# Patient Record
Sex: Female | Born: 1940
Health system: Southern US, Community
[De-identification: ages and names within clinical notes are randomized; demographics above are authoritative.]

## PROBLEM LIST (undated history)

## (undated) DIAGNOSIS — I2510 Atherosclerotic heart disease of native coronary artery without angina pectoris: Secondary | ICD-10-CM

## (undated) DIAGNOSIS — M1990 Unspecified osteoarthritis, unspecified site: Secondary | ICD-10-CM

## (undated) DIAGNOSIS — I442 Atrioventricular block, complete: Secondary | ICD-10-CM

## (undated) DIAGNOSIS — R0602 Shortness of breath: Secondary | ICD-10-CM

## (undated) DIAGNOSIS — Z950 Presence of cardiac pacemaker: Secondary | ICD-10-CM

## (undated) DIAGNOSIS — I219 Acute myocardial infarction, unspecified: Secondary | ICD-10-CM

## (undated) DIAGNOSIS — I872 Venous insufficiency (chronic) (peripheral): Secondary | ICD-10-CM

## (undated) DIAGNOSIS — I10 Essential (primary) hypertension: Secondary | ICD-10-CM

## (undated) DIAGNOSIS — E7800 Pure hypercholesterolemia, unspecified: Secondary | ICD-10-CM

## (undated) DIAGNOSIS — I499 Cardiac arrhythmia, unspecified: Secondary | ICD-10-CM

## (undated) DIAGNOSIS — E785 Hyperlipidemia, unspecified: Secondary | ICD-10-CM

## (undated) DIAGNOSIS — G8321 Monoplegia of upper limb affecting right dominant side: Secondary | ICD-10-CM

## (undated) DIAGNOSIS — G5793 Unspecified mononeuropathy of bilateral lower limbs: Secondary | ICD-10-CM

## (undated) DIAGNOSIS — G8929 Other chronic pain: Secondary | ICD-10-CM

## (undated) DIAGNOSIS — I509 Heart failure, unspecified: Secondary | ICD-10-CM

## (undated) DIAGNOSIS — R32 Unspecified urinary incontinence: Secondary | ICD-10-CM

## (undated) DIAGNOSIS — E119 Type 2 diabetes mellitus without complications: Secondary | ICD-10-CM

## (undated) HISTORY — DX: Atrioventricular block, complete: I44.2

## (undated) HISTORY — PX: BACK SURGERY: SHX140

## (undated) HISTORY — DX: Hyperlipidemia, unspecified: E78.5

## (undated) HISTORY — DX: Essential (primary) hypertension: I10

## (undated) HISTORY — PX: DILATION AND CURETTAGE OF UTERUS: SHX78

## (undated) HISTORY — DX: Venous insufficiency (chronic) (peripheral): I87.2

## (undated) HISTORY — PX: MOLE REMOVAL: SHX2046

## (undated) HISTORY — PX: KNEE ARTHROSCOPY: SHX127

---

## 1959-05-03 HISTORY — PX: DILATION AND CURETTAGE OF UTERUS: SHX78

## 1993-09-01 HISTORY — PX: LAPAROSCOPIC CHOLECYSTECTOMY: SUR755

## 2000-06-26 ENCOUNTER — Encounter: Admission: RE | Admit: 2000-06-26 | Discharge: 2000-06-26 | Payer: Self-pay | Admitting: General Surgery

## 2000-06-26 ENCOUNTER — Encounter (HOSPITAL_BASED_OUTPATIENT_CLINIC_OR_DEPARTMENT_OTHER): Payer: Self-pay | Admitting: General Surgery

## 2000-06-29 ENCOUNTER — Other Ambulatory Visit: Admission: RE | Admit: 2000-06-29 | Discharge: 2000-06-29 | Payer: Self-pay | Admitting: Internal Medicine

## 2000-08-28 HISTORY — PX: CARDIOVASCULAR STRESS TEST: SHX262

## 2000-09-10 ENCOUNTER — Ambulatory Visit (HOSPITAL_COMMUNITY): Admission: AD | Admit: 2000-09-10 | Discharge: 2000-09-10 | Payer: Self-pay | Admitting: Cardiology

## 2000-09-10 HISTORY — PX: CARDIAC CATHETERIZATION: SHX172

## 2002-12-14 ENCOUNTER — Emergency Department (HOSPITAL_COMMUNITY): Admission: EM | Admit: 2002-12-14 | Discharge: 2002-12-14 | Payer: Self-pay | Admitting: Emergency Medicine

## 2002-12-14 ENCOUNTER — Encounter: Payer: Self-pay | Admitting: Emergency Medicine

## 2003-02-07 ENCOUNTER — Other Ambulatory Visit: Admission: RE | Admit: 2003-02-07 | Discharge: 2003-02-07 | Payer: Self-pay | Admitting: Internal Medicine

## 2004-03-21 ENCOUNTER — Ambulatory Visit (HOSPITAL_COMMUNITY): Admission: RE | Admit: 2004-03-21 | Discharge: 2004-03-21 | Payer: Self-pay | Admitting: Internal Medicine

## 2004-05-22 ENCOUNTER — Ambulatory Visit (HOSPITAL_COMMUNITY): Admission: RE | Admit: 2004-05-22 | Discharge: 2004-05-22 | Payer: Self-pay | Admitting: Orthopedic Surgery

## 2004-10-04 ENCOUNTER — Ambulatory Visit (HOSPITAL_COMMUNITY): Admission: RE | Admit: 2004-10-04 | Discharge: 2004-10-04 | Payer: Self-pay | Admitting: Internal Medicine

## 2004-10-17 ENCOUNTER — Other Ambulatory Visit: Admission: RE | Admit: 2004-10-17 | Discharge: 2004-10-17 | Payer: Self-pay | Admitting: Internal Medicine

## 2004-11-26 ENCOUNTER — Encounter: Admission: RE | Admit: 2004-11-26 | Discharge: 2004-11-26 | Payer: Self-pay | Admitting: Orthopedic Surgery

## 2005-01-06 ENCOUNTER — Ambulatory Visit (HOSPITAL_COMMUNITY): Admission: RE | Admit: 2005-01-06 | Discharge: 2005-01-06 | Payer: Self-pay | Admitting: Orthopedic Surgery

## 2005-01-06 ENCOUNTER — Ambulatory Visit (HOSPITAL_BASED_OUTPATIENT_CLINIC_OR_DEPARTMENT_OTHER): Admission: RE | Admit: 2005-01-06 | Discharge: 2005-01-06 | Payer: Self-pay | Admitting: Orthopedic Surgery

## 2005-09-01 DIAGNOSIS — G8321 Monoplegia of upper limb affecting right dominant side: Secondary | ICD-10-CM

## 2005-09-01 HISTORY — PX: ANTERIOR CERVICAL DECOMP/DISCECTOMY FUSION: SHX1161

## 2005-09-01 HISTORY — DX: Monoplegia of upper limb affecting right dominant side: G83.21

## 2006-02-19 ENCOUNTER — Inpatient Hospital Stay (HOSPITAL_COMMUNITY): Admission: EM | Admit: 2006-02-19 | Discharge: 2006-02-20 | Payer: Self-pay | Admitting: Family Medicine

## 2006-03-12 ENCOUNTER — Encounter: Admission: RE | Admit: 2006-03-12 | Discharge: 2006-03-12 | Payer: Self-pay | Admitting: Internal Medicine

## 2006-03-20 ENCOUNTER — Inpatient Hospital Stay (HOSPITAL_COMMUNITY): Admission: AD | Admit: 2006-03-20 | Discharge: 2006-04-07 | Payer: Self-pay | Admitting: Neurosurgery

## 2006-03-26 ENCOUNTER — Ambulatory Visit: Payer: Self-pay | Admitting: Physical Medicine & Rehabilitation

## 2006-04-07 ENCOUNTER — Inpatient Hospital Stay (HOSPITAL_COMMUNITY)
Admission: RE | Admit: 2006-04-07 | Discharge: 2006-04-21 | Payer: Self-pay | Admitting: Physical Medicine & Rehabilitation

## 2006-05-25 ENCOUNTER — Encounter: Admission: RE | Admit: 2006-05-25 | Discharge: 2006-06-21 | Payer: Self-pay | Admitting: Neurosurgery

## 2006-06-22 ENCOUNTER — Encounter: Admission: RE | Admit: 2006-06-22 | Discharge: 2006-07-20 | Payer: Self-pay | Admitting: Neurosurgery

## 2006-07-21 ENCOUNTER — Encounter: Admission: RE | Admit: 2006-07-21 | Discharge: 2006-08-19 | Payer: Self-pay | Admitting: Neurosurgery

## 2007-02-11 ENCOUNTER — Ambulatory Visit: Payer: Self-pay | Admitting: *Deleted

## 2007-11-29 ENCOUNTER — Inpatient Hospital Stay (HOSPITAL_COMMUNITY): Admission: EM | Admit: 2007-11-29 | Discharge: 2007-12-02 | Payer: Self-pay | Admitting: Emergency Medicine

## 2007-11-30 ENCOUNTER — Encounter (INDEPENDENT_AMBULATORY_CARE_PROVIDER_SITE_OTHER): Payer: Self-pay | Admitting: Internal Medicine

## 2007-11-30 HISTORY — PX: TRANSTHORACIC ECHOCARDIOGRAM: SHX275

## 2007-12-01 HISTORY — PX: CARDIAC PACEMAKER PLACEMENT: SHX583

## 2008-10-24 HISTORY — PX: NM MYOCAR PERF WALL MOTION: HXRAD629

## 2008-10-25 ENCOUNTER — Encounter: Admission: RE | Admit: 2008-10-25 | Discharge: 2008-10-25 | Payer: Self-pay | Admitting: Neurosurgery

## 2010-09-22 ENCOUNTER — Encounter: Payer: Self-pay | Admitting: Internal Medicine

## 2010-11-06 ENCOUNTER — Other Ambulatory Visit: Payer: Self-pay | Admitting: Family Medicine

## 2010-11-06 DIAGNOSIS — Z1231 Encounter for screening mammogram for malignant neoplasm of breast: Secondary | ICD-10-CM

## 2010-11-15 ENCOUNTER — Ambulatory Visit
Admission: RE | Admit: 2010-11-15 | Discharge: 2010-11-15 | Disposition: A | Discharge status: Home / Self Care | Payer: Medicare Other | Source: Ambulatory Visit | Attending: Family Medicine | Admitting: Family Medicine

## 2010-11-15 DIAGNOSIS — Z1231 Encounter for screening mammogram for malignant neoplasm of breast: Secondary | ICD-10-CM

## 2011-01-07 ENCOUNTER — Other Ambulatory Visit: Payer: Self-pay | Admitting: Neurosurgery

## 2011-01-07 DIAGNOSIS — M549 Dorsalgia, unspecified: Secondary | ICD-10-CM

## 2011-01-07 DIAGNOSIS — M5410 Radiculopathy, site unspecified: Secondary | ICD-10-CM

## 2011-01-09 ENCOUNTER — Ambulatory Visit
Admission: RE | Admit: 2011-01-09 | Discharge: 2011-01-09 | Disposition: A | Discharge status: Home / Self Care | Payer: Medicare Other | Source: Ambulatory Visit | Attending: Neurosurgery | Admitting: Neurosurgery

## 2011-01-09 DIAGNOSIS — M5410 Radiculopathy, site unspecified: Secondary | ICD-10-CM

## 2011-01-09 DIAGNOSIS — M549 Dorsalgia, unspecified: Secondary | ICD-10-CM

## 2011-01-14 NOTE — Op Note (Signed)
[name], [name] NO.:  [id number]   MEDICAL RECORD NO.:  [id number]          PATIENT TYPE:  INP   LOCATION:  2807                         FACILITY:  [facility]   PHYSICIAN:  Ritta Slot, MD     DATE OF BIRTH:  1941/08/12   DATE OF PROCEDURE:  12/01/2007  DATE OF DISCHARGE:                               OPERATIVE REPORT   PROCEDURE:  Permanent pacemaker insertion.  Insertion of a [address]  [place], serial number [id number], dual-chamber pacemaker with active  atrial and ventricular leads.   COMPLICATIONS:  None.   INDICATIONS:  [name] is a 70 year old female patient, who was  admitted on November 29, 2007, in complete heart block and heart failure.  In the early hours of March 31, I inserted a temporary wire into her  left ventricle, where she was in complete heart block with a ventricular  rate of 30 with congestive heart failure.  She was brought to the  cardiac catheterization laboratory now for insertion of a permanent  pacemaker and removal of the temporary wire.   DESCRIPTION OF OPERATION:  After informed consent from the patient, the  left anterior chest was prepped and draped in sterile fashion.  EC  monitor established.  Lidocaine 1% was then used to anesthetize the left  mid and subclavicular areas.  She was given 2 mg of diazepam and 25 mcg  of fentanyl intravenously for light sedation.  Next, approximately a 3  cm mid-subclavicular incision was then carried out.  Hemostasis was  obtained with electrocautery.  Blunt dissection was used to carry this  down to the left pectoralis fascia.  Again, hemostasis obtained with  electrocautery.  Next, approximately a 3 x 4 cm pocket was created, left  pectoral fascia.  The left subclavian vein was then easily entered with  two #10 guide wires.  After the first #10 guide wire, a #7 [place]  dilator sheath was then easily inserted and the guide wire and dilator  then removed.  Through the first sheath, a  58 cm active [facility] Jude  Tendril SDX lead, serial number [id number], was then passed into the right  atrium.  Peel-away sheath was then removed.  A second #7 [place] dilator  sheath was then easily passed and the dilation catheter removed.  Through this, a 52 cm Tendril SDX active fixation lead, serial number  [id number], was then passed easily into the right atrium and peel-away  sheaths removed.  A J curved wire was then placed into the ventricular  lead and the ventricular lead was brought out through the tricuspid  valve and positioned at the RV apex without difficulty, so we were able  to capture 2 volts.  The screw was then extended, the threshold  determined.  R-waves were measured at 10.0 millivolts, impedance at 646  ohms, and threshold ventricle was 0.5 volts at 0.5 milliseconds.  The  current was 1.1 milliamp.  Ten volts negative for diaphragmatic  stimulation.  The preformed J stylet was then inserted into the atrial  lead and the atrial lead was then used to  map the right atrium.  Excellent position was found for the atrial lead without difficulty.  We  were able to find P-waves of 4.5.  Threshold in the atrial lead was  found to be 0.4 millivolts at 0.5 milliseconds.  Current was 1.6  milliamps.  Impedance was found to be 445 ohms.  Again, 10 volts were  found to have negative diaphragmatic stimulation.   The lead was screwed into place, as well.  Two silk sutures were needed  to anchor the lead to the pectoral fascia.  Again, hemostasis was  obtained, using electrocautery.  Pocket was then copiously irrigated  with 1% Kanamycin solution and the leads were connected in serial  fashion to a [facility], serial number [id number] generator.  Head screws  were tightened and pacing was confirmed.  Suture placed in the apex  pocket.  The generator leads were then delivered to the pocket.  Header  was secured with silk suture.  Subcutaneous layer was then closed using  2-0 Vicryl.  Skin  was then closed with 4-0 Vicryl.  Steri-Strips were  applied.  Patient was returned to recovery room in stable condition.   COMPLICATIONS:  None.   CONCLUSION:  Successful implant of a [address] [place], serial  number [id number] dual-chamber pacemaker , with active atrial and  ventricular leads.      Ritta Slot, MD  Electronically Signed     HS/MEDQ  D:  12/01/2007  T:  12/01/2007  Job:  [id number]

## 2011-01-14 NOTE — H&P (Signed)
[name], [name] NO.:  [id number]   MEDICAL RECORD NO.:  [id number]          PATIENT TYPE:  INP   LOCATION:  2303                         FACILITY:  [facility]   PHYSICIAN:  Lonia Blood, M.D.      DATE OF BIRTH:  Feb 08, 1941   DATE OF ADMISSION:  11/29/2007  DATE OF DISCHARGE:                              HISTORY & PHYSICAL   PRIMARY CARE PHYSICIAN:  Pearson Grippe, M.D.   CARDIOLOGIST:  Charolette Child, M.D.   PRESENTING COMPLAINT:  Dizziness, nausea, vomiting.   HISTORY OF PRESENT ILLNESS:  The patient is a 70 year old female with  history of hypertension and coronary artery disease and diet-controlled  diabetes who has apparently been doing okay until the past three days  when she started feeling weak and dizzy.  This progressed and became  worse and today she had some vomiting as well.  Hence, she was brought  into the emergency room.  In the ER the patient was found to have  complete heart block.  She is being evaluated by cardiology for possible  pacemaker.  She has had history of vertigo before, but she was seen by  her primary care physician as far back as 2006.  Today, however, she  went to the office where her rate was found to be 26 and was referred to  the emergency room.   PAST MEDICAL HISTORY:  Significant for coronary artery disease, status  post MI more than 10 years ago, hypertension and diet-controlled  diabetes.   ALLERGIES:  She is has no known drug allergies.   MEDICATIONS:  1. Aspirin 81 mg daily.  2. Bisoprolol/hydrochlorothiazide 5/25 mg.  3. Uniretic 15/12.5 mg.   SOCIAL HISTORY:  The patient is married, lives in [place].  She is a  former tobacco smoker but no alcohol intake.   FAMILY HISTORY:  Significant for diabetes and hypertension.   REVIEW OF SYSTEMS:  A 12-point review of systems is negative except per  HPI.  The patient complained mainly of weakness and dizziness as  indicated.   EXAMINATION:  Temperature is 97.4, blood  pressure 110/50.  Her pulse is  28, respiratory rate 25, sats 94% on room air.  GENERAL: The patient is awake, alert, oriented but appears weak, in no  acute distress.  HEENT:  PERRL.  EOMI.  NECK:  Supple.  No JVD, no lymphadenopathy.  RESPIRATORY:  She has good air entry bilaterally.  No wheezes, no rales.  CARDIOVASCULAR SYSTEM:  The patient is profoundly bradycardic with  distant heart sounds.  ABDOMEN:  Soft, nontender with positive bowel sounds.  EXTREMITIES:  No edema, cyanosis or clubbing.   LABORATORY DATA:  White count is 10.0, hemoglobin 12.6, platelet count  184.  Initial cardiac enzymes negative.  Sodium 136, potassium 4.5,  chloride 106, CO2 24, glucose 109, BUN 30, creatinine 0.77, calcium 8.4.  Chest x-ray showed medial right lung base and probable retrocardiac air  space disease, edema versus infection likely.  Her BNP is more than  1000.  EKG showed complete heart block with a rate of 26 with complete  AV dissociation, rightward axis and some LVH, mild nonspecific T-wave  changes.   ASSESSMENT:  This is a 70 year old female presenting with complete heart  block most likely secondary to cardiomyopathy.  The patient was seen by  her primary care physician in the office.  She is here for further  management.  She will need a permanent pacemaker and further workup.   PLAN:  1. Complete heart block.  Will admit the patient to the intensive care      unit.  I will put her on some external pacer.  Will monitor her      closely.  Contact [facility] who are      covering for tonight.  The patient will require permanent pacer in      the morning unless she deteriorates overnight.  2. Congestive heart failure.  Based on the patient's chest x-ray and      BNP, it seems like the patient is having some fluid overload.  I      will put her on some low-dose Lasix at this point.  Consider some      dopamine or dobutamine if okay with cardiology.  Continue  with some      aspirin as needed.  3. Hypertension.  The patient's blood pressure seems to be okay, on      the low side now.  I will just continue with Lasix and no other      treatment at this point.  4. History of coronary artery disease.  The cause of the patient's      complete heart block is not fully known.  Will check her cardiac      enzymes, rule out ischemia and treat accordingly.  5. Diet-controlled diabetes.  Will check hemoglobin A1c and continue      sliding scale insulin as necessary.      Lonia Blood, M.D.  Electronically Signed     LG/MEDQ  D:  11/29/2007  T:  11/30/2007  Job:  [id number]

## 2011-01-17 NOTE — Discharge Summary (Signed)
NAMEMARCHETTA, NAVRATIL NO.:  [id number]   MEDICAL RECORD NO.:  [id number]          PATIENT TYPE:  INP   LOCATION:  6526                         FACILITY:  [facility]   PHYSICIAN:  Elliot Cousin, M.D.    DATE OF BIRTH:  Jan 28, 1941   DATE OF ADMISSION:  02/19/2006  DATE OF DISCHARGE:  02/20/2006                                 DISCHARGE SUMMARY   DISCHARGE DIAGNOSES:  1.  Atypical chest pain, cardiac enzymes were negative during hospital      course.  2.  Possible degenerative joint disease with associated neuropathy as a      cause of the pain.  3.  History of chest pain with normal coronary arteries per catheterization      in 2002.  4.  Abnormal EKG which revealed a bifascicular block, unchanged from the EKG      in May2006.  5.  Hyperlipidemia.  6.  Type 2 diabetes mellitus.  7.  Hypertension.   For the secondary discharge diagnoses and past medical history, please see  the dictated history and physical.   DISCHARGE MEDICATIONS:  1.  Bisoprolol 5 mg daily.  2.  Metformin 750 mg daily.  3.  Uniretic 15/12.5 mg half a tablet daily.  4.  Neurontin 300 mg q.h.s.  5.  Lipitor 20 mg q.h.s.  6.  Aspirin 81 mg daily.  7.  Tylenol or Motrin take as directed and as needed.   DISCHARGE DISPOSITION:  The patient was discharged home in improved and  stable condition on June22,2007.  She was advised to follow up with Dr.  Lendell Caprice on [phone] at 11:15 a.m.  She was also advised to follow up  with Dr. Aleen Campi in approximately 2 weeks for further evaluation.   HISTORY OF PRESENT ILLNESS:  The patient is a 70 year old lady with a past  medical history significant for a myocardial infarction yet with a normal  cardiac catheterization in 2002, hypertension, and diabetes mellitus, who  presented to the emergency department on June21,2007 with a chief complaint  of chest pain.  The pain was primarily located over the left lateral chest  wall with some radiation to the  left shoulder and associated left arm  numbness.  She did receive some relief with a nitroglycerin tablet given by  the emergency department physician.  When she was evaluated in the emergency  department, her cardiac markers were negative.  Her EKG revealed a stable  bifascicular block.  However, given the patient's risk factors, she was  admitted for further evaluation and management.   HOSPITAL COURSE:  Problem 1.  ATYPICAL CHEST PAIN.  The patient was started  on prophylactic Lovenox, oxycodone and Tylenol as needed for pain, aspirin  therapy, and beta blocker therapy with Bisoprolol.  The patient was further  evaluated with cardiac enzymes and a fasting lipid panel.  The cardiac  enzymes were completely negative during hospital course.  Her fasting lipid  panel revealed a total cholesterol of 211, triglycerides of 380, HDL  cholesterol of 41, and LDL cholesterol of 94.  The patient was therefore  started on therapy with Lipitor 20 mg q.h.s.  Her TSH was also assessed and  found to be within normal limits at 2.246.  Upon further questioning, the  patient's symptoms originate from the left lateral wall with some radiation  to the left shoulder and occasional numbness down the left arm.  On  admission a chest x-ray was ordered and revealed no acute cardiopulmonary  disease, however it did reveal degenerative changes at the [facility] joints  bilaterally.  On exam, the patient had a fairly good range of motion of her  shoulder however, there was some tenderness.  The sensation of both arms  were intact.  However, given that the patient's symptoms are intermittent,  more than likely her pain is secondary to degenerative joint disease with  possible associated neuropathy.  The neuropathy not only could be from  degenerative joint disease but it could also be from diabetes.  Neurontin,  300 mg q.h.s. was prescribed prior to discharge Her chest pain resolved  prior to hospital discharge.  Given that  she had normal cardiac enzymes, she  was discharged to home. Further evaluation will be deferred to her primary  care physician, Dr. Lendell Caprice.   The patient certainly may benefit from cardiac retesting in the outpatient  setting.  This will be deferred to her primary cardiologist, Dr. Aleen Campi.   Problem 2.  HYPERLIPIDEMIA.  Given the results of the fasting lipid panel as  indicated above, the patient was started on Lipitor 20 mg q.h.s.   Problem 3.  DIABETES MELLITUS.  The patient was maintained on Glucophage  during hospital course.  Her hemoglobin A1c was 7.0.   Problem 4.  HYPERTENSION.  The patient was maintained on bisoprolol during  hospital course.  She was advised to continue the bisoprolol and  Uniretic  as previously prescibed.      Elliot Cousin, M.D.  Electronically Signed     DF/MEDQ  D:  02/23/2006  T:  02/24/2006  Job:  [id number]   cc:   Janae Bridgeman. Eloise Harman., M.D.  Fax: [phone]   Antionette Char, MD  Fax: [phone]

## 2011-01-17 NOTE — Discharge Summary (Signed)
NAMEHSER, BELANGER NO.:  [id number]   MEDICAL RECORD NO.:  [id number]          PATIENT TYPE:  INP   LOCATION:  2626                         FACILITY:  [facility]   PHYSICIAN:  Hilda Lias, M.D.   DATE OF BIRTH:  07-06-1941   DATE OF ADMISSION:  03/20/2006  DATE OF DISCHARGE:  04/07/2006                                 DISCHARGE SUMMARY   ADMISSION DATE:  March 20, 2006   DISCHARGE DATE:  April 07, 2006   ADMISSION DIAGNOSIS:  C5-6 and C6-7 herniated disc.   FINAL DIAGNOSES:  C5-6 and C6-7 herniated disc.  Calcification of the posterior ligament.   CLINICAL HISTORY:  Ms. Kratz is a lady, who was seen in my office  complaining of neck pain radiating up to the left upper extremity.  The  physical examination showed that she has weakness of biceps wrists and  triceps and she was unable to walk in a straight line.  X-ray of her neck  showed she has a quite impressive MRI with a large herniated disc and  calcified lower C5-6 and C6-7.  Because of the findings, surgery was  advised.   LABORATORY:  Normal.   COURSE OF THE HOSPITAL:  The patient was taken to surgery and the plan was  do a C5-6 and C6-7 discectomy, but because of calcification of the posterior  ligament, it was compromised to the spinal cord.  We ended up doing C5-6  corpectomy.  The procedure took around 4-5 hours and we had to transfuse  blood.  After surgery, patient was sleeping.  She has weakness of both lower  extremities.  She has had decreased of sensation in the right arm.  During  the procedure, we had to remove the dura matter and replace it with Duragen  and because of that, the patient had lumbar __________ .  In the ICU, the  patient was stable.  She was dense weak in the right side, but eventually  she started to get stronger in both legs, although was slow in the right  arm.  The LP  was kept for drainage until we repeat the x-ray and there was  no evidence of any CSF leak in the  neck.  The Jackson-Pratt drain was  removed and 2 days later, the LP drain was completely removed.  The patient  was neurologically stable and because she was going to require quite a bit  of rehabilitation, she was transferred to a rehab unit.  By the time, Ms.  Womac went to the rehab unit, she was awake, walking with quite a bit of  weakness of the right upper extremity, mostly involving the triceps and the  right fingers.   CONDITION ON DISCHARGE:  Is stable.   MEDICATIONS:  She will taking continue taking the same medications as she  was in the ICU.   Diet:  Regular.  Activity will be up to the rehabilitation center.   FOLLOWUP:  I will continue to follow Ms. Ey while she is in the rehab  unit.  ______________________________  Hilda Lias, M.D.     EB/MEDQ  D:  05/26/2006  T:  05/26/2006  Job:  [id number]

## 2011-01-17 NOTE — H&P (Signed)
[name], [name] NO.:  [id number]   MEDICAL RECORD NO.:  [id number]          PATIENT TYPE:  IPS   LOCATION:  4148                         FACILITY:  [facility]   PHYSICIAN:  Erick Colace, M.D.DATE OF BIRTH:  May 06, 1941   DATE OF ADMISSION:  04/07/2006  DATE OF DISCHARGE:                                HISTORY & PHYSICAL   REASON FOR ADMISSION:  Decline in self care mobility, right upper extremity  weakness and balance disorder secondary to cervical myelopathy.   HISTORY:  A 70 year old female with coronary artery disease and NIDDM  admitted March 20, 2006 with progressive upper extremity weakness and  numbness. MRI of the C spine shows C5-6, C6-7 stenosis and myelopathy. She  underwent C5-6 corpectomy and decompression and removal of calcified  posterior longitudinal ligament and infusion with PEEK cages C4 through C7  per Dr. Jeral Fruit.   PROTOCOL:  Postoperative C collar p.r.n. comfort when out of bed, subcu  heparin for DVT prophylaxis. Postop JP drain and LP PCA changed to p.o.  medications March 28, 2006. JP drain discontinued March 31, 2006, LP drain  discontinued April 06, 2006. Clean dressing, monitored and no CSF leak  noted. Decadron protocol completed. Had bouts of bradycardia in the 40s to  60s, Zebeta held if heart rate less than 50. Plan to have lateral C spine  films today.   REVIEW OF SYSTEMS:  Positive for weakness and numbness in the right upper  extremity as well as the lower extremities, lumbago and reflux.   PAST MEDICAL HISTORY:  Obesity and IDDM, hypertension, cholecystectomy.   FAMILY HISTORY:  CAD, hypertension.   SOCIAL HISTORY:  Lives with her husband, daughter to assist her in the day.  Husband and son to help at night. One-level home, four steps to enter.   FUNCTIONAL HISTORY:  Independent prior to admission.   FUNCTIONAL STATUS:  Needs assist with ADLs and mobility.   HOME MEDICATIONS:  1.  Neurontin 300 daily.  2.   __________ 750 daily.  3.  Bisoprolol 5 p.o. daily.  4.  Lexapro/hydrochlorothiazide 5/12.5 1/2 daily.  5.  Vitamin B6 daily.  6.  Vitamin B12 daily.  7.  Aspirin 81 mg daily.   ALLERGIES:  None known.   LABORATORY DATA:  BUN 17, creatinine 0.8.   PHYSICAL EXAMINATION:  VITAL SIGNS:  Blood pressure 110/60, pulse 68,  respirations 18, temperature 98.5.  GENERAL:  Obese female in no acute distress.  HEENT:  Eyes anicteric, not injected.  Extraocular movements, ENT normal.  NECK:  Supple without adenopathy.  LUNGS:  Respiration was good, lungs clear.  HEART:  Regular rate and rhythm. No rubs or extra sounds.  ABDOMEN:  Obese. Positive bowel sounds, soft, nontender to palpation.  EXTREMITIES:  No cyanosis, clubbing or edema. She has basically 1/5 strength  in the right deltoid, 2- at the biceps, 1 at the triceps, 2- at the wrist  flexors and extensors and finger flexor extensors. She had decreased  sensation in the right upper extremity to light touch. The lower extremity  has mildly decreased sensation but she  can identify light touch bilaterally.  Left upper extremity is 4/5 strength, lower extremities is 4/5 strength.  NEUROLOGIC:  Mood and orientation are intact. Affect is flat.   IMPRESSION:  1.  Cervical myelopathy, history of C5-6, C6-7 cervical stenosis status post      decompression and fusion.  2.  Bradycardia. Monitor on her current medications.  3.  Pain control. Continue Lyrica 75 t.i.d., Vicodin p.r.n., Robaxin p.r.n.,      DVT prophylaxis subcu heparin q.8.  4.  Non-insulin-dependent diabetes mellitus. Rehab nurse monitor CBG a.c.      h.s.  5.  Hypertension. Nurse needs to monitor as well as therapy. Mavik, Zebeta,      hydrochlorothiazide.   Will admit for comprehensive intensive inpatient rehabilitation with PT for  mobility, OT for ADLs, nursing for skin care over the cervical wound as well  as neurogenic bowel and bladder. The patient appears to be having  some  urinary retention issues and may need an intermittent catheterization  program. Will check bladder volume indicators.   ESTIMATED STAY:  7-10 days.   Patient good rehab candidate. Estimated function on discharge is  intermittent supervision for ADLs and mobility.      Erick Colace, M.D.  Electronically Signed     AEK/MEDQ  D:  04/07/2006  T:  04/07/2006  Job:  [id number]   cc:   Hilda Lias, M.D.  Antionette Char, MD

## 2011-01-17 NOTE — Discharge Summary (Signed)
[name], [name] NO.:  [id number]   MEDICAL RECORD NO.:  [id number]          PATIENT TYPE:  INP   LOCATION:  3735                         FACILITY:  [facility]   PHYSICIAN:  Ritta Slot, MD     DATE OF BIRTH:  [name]   DATE OF ADMISSION:  11/29/2007  DATE OF DISCHARGE:  12/02/2007                               DISCHARGE SUMMARY   DISCHARGE DIAGNOSES:  1. Complete heart block, status post [employer] Jude PTVDP this admission,      December 01, 2007.  2. Known coronary artery disease with remote myocardial infarction,      followed by Dr. Aleen Campi.  3. Treated hypertension.  4. Diet-controlled diabetes.  5. Treated dyslipidemia.   HOSPITAL COURSE:  The patient is a 70 year old female who is followed by  Dr. Selena Batten and Dr. Aleen Campi with remote coronary disease that was not  intervened on and hypertension and diabetes and dyslipidemia.  The  patient presented to [facility] ER November 29, 2007, with dizziness and near  syncope.  She was actually admitted by the [facility] and we saw  in consult.  EKG shows a heart rate of 26 and complete heart block.  The  patient was seen by Dr. Lynnea Ferrier.  A temporary pacemaker was placed on an  urgent basis.  The patient was transferred to Dr. Donavan Burnet service.  Permanent pacemaker was implanted by Dr. Lynnea Ferrier, December 01, 2007.  She  had a Therapist, sports.  She tolerated this well.  We felt she can  be discharged on December 02, 2007.   DISCHARGE MEDICATIONS:  Unfortunately not listed in the chart.  At the  time of this dictation, I am unable to ascertain what medicines she was  sent home on.  There is no med rec sheet in the chart and no copy of any  discharge instructions are left in the chart.   LABORATORY DATA:  Postop chest x-ray shows no pneumothorax.  White count  8.0, hemoglobin 12.4, hematocrit 35.8, platelets 122, INR is 1.1.  Sodium 138, potassium 3.9, BUN 9, creatinine 0.55 BNP on admission was  1098.  CK-MB and troponin  were negative.  TSH 2.7.   DISPOSITION:  The patient apparently was discharged in stable condition.  Followup should be with Dr. Aleen Campi.      Abelino Derrick, P.A.      Ritta Slot, MD  Electronically Signed    LKK/MEDQ  D:  02/10/2008  T:  02/11/2008  Job:  [id number]   cc:   Antionette Char, MD

## 2011-01-17 NOTE — H&P (Signed)
NAMENOELIA, LENART NO.:  [id number]   MEDICAL RECORD NO.:  [id number]          PATIENT TYPE:  INP   LOCATION:  1844                         FACILITY:  [facility]   PHYSICIAN:  Hettie Holstein, D.O.    DATE OF BIRTH:  [birth year]   DATE OF ADMISSION:  02/19/2006  DATE OF DISCHARGE:                                HISTORY & PHYSICAL   PRIMARY CARE PHYSICIAN:  Janae Bridgeman. Lendell Caprice, M.D.   CARDIOLOGIST:  Antionette Char, M.D.   CHIEF COMPLAINT:  Chest pain.   HISTORY OF PRESENT ILLNESS:  Mrs. Rimsha Trembley is a sedentary 70 year old  female with a known history of coronary disease.  According to her, she  states that Dr. Aleen Campi informed her she had a MI in the past.  She has  undergone cardiac catheterization in 2002 with a normal coronaries at that  time.  In any event, she had some on and off sharp chest pain radiating to  her left arm with left arm numbness.  She states that this is deep in the  left parasternal region with some radiation to her back.  She denied any  diaphoresis or shortness of breath.  She is typically at rest with this  discomfort.  She had some relief with nitroglycerin in the emergency  department.   Her initial point of care markers were negative in the department.  Her EKG  revealed normal sinus rhythm with a right bundle branch block as well as  left anterior fascicular block.  This was compared to EKG done in May of  2006 which was unchanged.   In the emergency department, she did report increase in symptoms and  subsequent decrease in her heart rate on telemetry.  Perhaps suggesting that  this may be a vagally mediated.  In any event, she is being admitted for  rule out of an acute event.  We will follow her cardiac markers and perhaps  request Dr. Aleen Campi to see her soon for further risk stratification.   PAST MEDICAL HISTORY:  1.  Mrs. Oloughlin as noted above has history of obesity.  2.  She has history of diabetes, non-insulin  requiring.  3.  Hypertension.  4.  History of MI in the distant past.  She had a cardiac catheterization in      January of 2002 with normal coronaries and an ejection fraction of 60%.  5.  She is status post cholecystectomy as well as knee surgeries.   MEDICATIONS:  She states she takes Metformin, she believes 500 mg q.h.s. and  bisoprolol (she does not know the dose) and aspirin daily.  She uses [facility] in  [place].   ALLERGIES:  She has no known drug allergies.   SOCIAL HISTORY:  The patient works.  She is self-employed.  Works at [employer].  She has two children.  She quit smoking tobacco about 13  years ago.  She lives with her husband.  She denies alcohol.   FAMILY HISTORY:  Mother and father both died in their 107s.  They had heart  disease at  an early age in their 108's and 74's.  They both suffered from  diabetes.   REVIEW OF SYSTEMS:  She had been in her usual state of health.  No shortness  of breath, swelling of her extremities.  No appetite or weight loss.  No  diarrhea, nausea, vomiting of coffee-ground emesis or dark tarry stools.  Further review is unremarkable.   PHYSICAL EXAMINATION:  VITAL SIGNS:  In the emergency department, her blood  pressure was 150/76, heart rate was 66, respirations 22, temperature 98.7.  O2 saturation 99%.  GENERAL:  The patient is alert and in no acute distress.  Conversing and  euthymic with a stable affect.  HEENT:  Reveals the head to be normocephalic and atraumatic.  Extraocular  movements intact.  NECK:  Supple and nontender.  No palpable thyromegaly or mass.  CARDIOVASCULAR:  Normal S1 and S2.  LUNGS:  Clear bilaterally.  She exhibits normal effort.  There is no  dullness to percussion.  ABDOMEN:  Soft and nontender.  There is no rebound or guarding.  EXTREMITIES:  No edema.  Peripheral pulses are symmetrical and palpable in  upper and lower extremities.  She has no calf tenderness.  NEUROLOGICAL:  As noted above.  She has no  focal deficits.   LABORATORY DATA:  Her sodium was 138, potassium 3.7, BUN 17, creatinine 0.5,  glucose 112, INR of 0.9.  Point of care is negative.  Chest x-ray was  unremarkable for acute disease.   ASSESSMENT:  1.  Atypical chest pain.  2.  Bifascicular heart block, right bundle and left anterior fascicular.  3.  Diabetes.  4.  Obesity.  5.  History of myocardial infarction per patient provided history.  6.  Hypokalemia.   PLAN:  At this time, we are going to cycle cardiac markers.  We are going to  follow on telemetry.  Follow her PR interval.  She does not know the dose of  beta blocker.  We will watch this carefully as it did not precipitate a  trifascicular block.  We will continue aspirin and initiate low-dose ACE  inhibitor for additional hypertensive management.      Hettie Holstein, D.O.  Electronically Signed     ESS/MEDQ  D:  02/19/2006  T:  02/19/2006  Job:  [id number]   cc:   Antionette Char, MD  Fax: [phone]   Janae Bridgeman. Eloise Harman., M.D.  Fax: [phone]

## 2011-01-17 NOTE — Cardiovascular Report (Signed)
[facility]  Patient:    [name], [name]                       MRN: [id number] Proc. Date: 09/10/00 Adm. Date:  [id number] Attending:  Silvestre Mesi CC:         Janae Bridgeman. Eloise Harman., M.D.  Cath Lab   Cardiac Catheterization  PROCEDURE: 1. Left heart catheterization. 2. Coronary cineangiography. 3. Left ventricular cineangiography. 4. Abdominal aortogram. 5. Perclose of the right femoral artery.  INDICATIONS:  This 70 year old female has a long history of chest pain and prior cardiac catheterization in 1996 showing normal coronary arteries. She recently had severe chest pain again which was very typical of angina with exertional pain and a Cardiolite stress test showing inferior wall scar.  Her resting electrocardiography also changed significantly showing ST T wave changes of ischemia.  She was then rescheduled for cardiac catheterization. She also has a history of hypertension.  DESCRIPTION OF PROCEDURE:  After signing an informed consent, the patient was premedicated with 50 mg of Benadryl intravenously and brought to the cardiac catheterization lab.  Her right groin was prepped and draped in the usual sterile fashion and anesthetized locally with 1% lidocaine.  A #6 French introducer sheath was inserted percutaneously to the right femoral artery. Access from the right femoral artery was obtained with an ultrasound Smart needle.  A 6 French #4 Judkins coronary catheters were used to make injections into the coronary arteries.  A 6 French pigtail catheter was used to measure pressures in the left ventricle and aorta and to make midstream injections into the left ventricle and abdominal aorta.  The patient tolerated the procedure well and no complications were noted.  At the end of the procedure, the catheter and sheath were removed from the right femoral artery and hemostasis was easily obtained with a Perclose closure  system.  MEDICATIONS GIVEN:  Versed 1 mg IV.  HEMODYNAMIC DATA:  Left ventricular pressure 181/10-27, aortic pressure 181/85 with a mean of 120.  CINE FINDINGS:  Coronary cineangiography.  Left coronary artery.  The ostium and left main appeared normal.  Left anterior descending appears normal.  Circumflex coronary artery appears normal.  Right coronary artery appears normal.  LEFT VENTRICULAR CINEANGIOGRAM:  The left ventricular chamber size is mildly enlarged.  The left ventricular wall thickness appears normal.  The overall left ventricular contractility appears normal with an ejection fraction that was measured at 60%.  The mitral valve appears normal without calcification, insufficiency, or prolapse.  The aortic valve appears normal.  ABDOMINAL AORTOGRAM:  The abdominal aorta appears normal without calcification or plaque.  The renal arteries appear normal.  FINAL DIAGNOSES: 1. Normal coronary arteries. 2. Normal left ventricular function, ejection fraction 60% with mild cardiac    enlargement. 3. Normal abdominal aorta and renal arteries. 4. Normal mitral and aortic valves. 5. Successful Perclose of the right femoral artery.  DISPOSITION:  Will monitor on the shortstay unit and plan for discharge in several hours. DD:  09/10/00 TD:  09/10/00 Job: [id number]

## 2011-01-17 NOTE — Discharge Summary (Signed)
NAMEJUNIE, [name] NO.:  [id number]   MEDICAL RECORD NO.:  [id number]          PATIENT TYPE:  IPS   LOCATION:  4036                         FACILITY:  [facility]   PHYSICIAN:  Erick Colace, M.D.DATE OF BIRTH:  1941-06-12   DATE OF ADMISSION:  04/07/2006  DATE OF DISCHARGE:  04/21/2006                                 DISCHARGE SUMMARY   DISCHARGE DIAGNOSES:  1. Cervical C5-6-6-7 cervical stenosis with myelopathy status post      cervical 5-6 colpectomy with decompression March 20, 2006.  2. J-P lumbar drain discontinued.  3. Decadron taper completed.  4. Pain management.  5. Subcutaneous heparin for deep vein thrombosis prophylaxis.  6. Neurogenic bladder.  7. Hypertension.  8. Non-insulin-dependent diabetes mellitus.   A 70 year old female admitted July 20 with progressive upper extremity  weakness and numbness.  MRI of cervical spine was C5-6-6-7 stenosis with  myelopathy.  Underwent C5-6 colpectomy with decompression and removal of  calcified posterior ligament with intimal infusion with peek and autoplate  cervical C4-7 July 20 per Dr. Jeral Fruit.  Decadron protocol postoperative.  Cervical collar ordered for comfort as needed.  Subcutaneous heparin was  initiated for deep vein thrombosis prophylaxis.  Postoperatively, she did  receive a J-P drain as well as a lumbar LP drain.  These were discontinued  on July 31, August 6, respectively.  Clean dressing was applied to site.  Bouts of bradycardia of 40-60 beats per minute and Zebeta was held if heart  rate less than 50.   PAST MEDICAL HISTORY:  See discharge diagnoses.  No alcohol.  Remote smoker.   ALLERGIES:  None.   SOCIAL HISTORY:  Lives with husband.  Daughter to assist during the day and  husband and son to help at nighttime.  They live in a one-level home with  four steps to entry.   MEDICATION PRIOR TO ADMISSION:  1. Neurontin 300 mg daily.  2. Glucophage 750 mg daily.  3. Bisoprolol 5 mg  daily.  4. Moexipril-HCTZ 5-12.5 mg one-half tablet daily.  5. Vitamin B6 daily.  6. Vitamin B12 daily.  7. Aspirin 81 mg daily.   REHABILITATION HOSPITAL COURSE:  The patient was admitted to inpatient rehab  services with therapies initiated on a three-hour daily basis consisting of  physical therapy, occupational therapy, and rehabilitation nursing.  The  following issues were addressed during patient's rehabilitation stay.  Pertaining to [name]'s cervical C5-6-6-7 cervical stenosis with  myelopathy she had undergone colpectomy with decompression March 20, 2006 per  Dr. Jeral Fruit.  Surgical site healing nicely.  Her Jackson-Pratt drain had  since been discontinued.  Decadron taper also completed.  She received  routine follow-up for neurosurgery, Dr. Jeral Fruit.  She showed progressive  gains in her overall therapies as she was now overall supervision to minimal  guard.  She did need minimal assist for lower body activities of daily  living, supervision upper body.  She was wearing a cervical collar only as  needed for comfort.  Pain management with adjustments made accordingly,  monitoring of mental status.  She was currently on  Lyrica 150 mg twice daily  as well as Robaxin for muscle spasms and using Ultram for breakthrough pain.  Her subcutaneous heparin was discontinued at time of discharge.  This had  been used for deep vein thrombosis prophylaxis.  Her calves remained cool  without any swelling, erythema, nontender.  Noted neurogenic bladder with  latest void of 150 mL, post void residual 503 mL with intermittent  catheterization of 500.  She did continue on Urecholine 25 mg three times  daily as well as Flomax 0.8 mg daily.  Patient and family were to be  instructed on intermittent catheterization versus indwelling Foley catheter.  She could follow up urology services as needed on an outpatient basis if  needed.  It was felt with ongoing increase in mobility that her bladder   would improve.  Blood sugars overall well controlled 76, 81, 97.  She would  continue on her diabetic diet as well as Glucophage of 750 mg daily.  Blood  pressures monitored with diastolic pressures of 68-73.  During her  rehabilitation stay due to some questionable increased right-sided weakness,  a cranial CT scan was ordered by Dr. Jeral Fruit that showed no acute changes,  only chronic microvascular white matter disease.   Latest laboratories showed a hemoglobin 13.7, hematocrit 39, platelet  183,000.  Sodium 135, potassium 4.1, BUN 12, creatinine 0.7.   DISCHARGE MEDICATIONS AT TIME OF DICTATION:  1. Zebeta 5 mg daily.  2. Vitamin B12 100 mcg daily.  3. Pepcid 20 mg twice daily.  4. Hydrochlorothiazide 6.25 mg daily.  5. Glucophage 750 mg daily.  6. Vitamin B6 50 mg daily.  7. Mavik 0.5 mg daily.  8. Lyrica 150 mg p.o. b.i.d.  9. Urecholine 25 mg p.o. three times daily.  10.Senokot tablets two at bedtime.  11.Flomax 0.8 mg at bedtime.  12.Robaxin 500 mg every six hours as needed spasms.  13.Ultram 50-100 mg three times daily as needed.   ACTIVITY:  As tolerated with cervical collar as needed for comfort.   DIET:  Diabetic diet.   SPECIAL INSTRUCTIONS:  Intermittent catheterizations every six hours.  Keep  volumes less than 350 mL.   FOLLOW UP:  Dr. Jeral Fruit [phone], neurosurgery; Dr. Lendell Caprice, medical  management.  Plan can be arranged to follow up with outpatient urology as  needed on an outpatient basis pertaining to her urinary retention after  follow-up with Dr. Jeral Fruit.      Mariam Dollar, P.A.      Erick Colace, M.D.  Electronically Signed    DA/MEDQ  D:  04/20/2006  T:  04/20/2006  Job:  [id number]   cc:   Hilda Lias, M.D.  Janae Bridgeman. Eloise Harman., M.D.  Antionette Char, MD

## 2011-01-17 NOTE — Op Note (Signed)
[name], [name] NO.:  [id number]   MEDICAL RECORD NO.:  [id number]          PATIENT TYPE:  INP   LOCATION:  2626                         FACILITY:  [facility]   PHYSICIAN:  Hilda Lias, M.D.   DATE OF BIRTH:  [birth year]   DATE OF PROCEDURE:  03/20/2006  DATE OF DISCHARGE:                                 OPERATIVE REPORT   PREOPERATIVE DIAGNOSES:  C5-6, C6-7 stenosis with cervical myelopathy.  Acute left C6-7 radiculopathy.   POSTOPERATIVE DIAGNOSES:  Same.   PROCEDURE:  C5-6 colpectomy  Decompression of the spinal cord.  Removal of calcified posterior ligament.  Intimal infusion with PEEK and aortograph plate from W2-3 microscope.   SURGICAL ASSISTANT:  Dr. Lovell Sheehan   CLINICAL HISTORY:  [name] is a 70 year old female who was seen by me  about two years ago because of lumbar spondylolisthesis.  She was seen  yesterday as urgent because of sudden onset of neck pain with radiation to  the left upper extremity.  The patient has been having some problems with  balance and she said that was secondary to her knees.  At the time I saw her  in my office it was difficult to evaluate the weakness because she had so  much pain.  She had weakness of the biceps and triceps but the most  important thing is that she had quite a bit of difficulty with coordination.  The cervical MRI showed severe stenosis at the level of 5-6 and 6-7  secondary to what looked like a calcified ligament with displacement of the  spinal cord at the level of 5-6 to the left and posterior and the midline  which was C6-7.  Because of the findings I talked to her and her daughter.  Surgery was advised.  The procedure was to see if we could attempt a  discectomy but leaving corpectomy most likely as the choice.  The risks were  explained to both of them including possibility of paralysis, no improvement  whatsoever, need for further surgery, __________  CSF leak, damage to the  esophagus,  damage to the carotid artery.   The patient was taken to the OR and she was intubated in the neutral  position.  No pillow or any type of material was used in the shoulder.  The  neck was prepped with DuraPrep and then a longitudinal incision was made  through the skin, subcutaneous tissue down to the cervical spine.  The  patient had a quite a bit of large osteophyte at the level of 5-6 and 6-7.  It was difficult to look at the x-ray because of a large shadow but at the  end we were able to see the where the needle was at the level of C4, C5.  During surgery we attempted to do a discectomy at 5-6 and most of the  discectomy was accomplished.  Nevertheless, there were more discs behind  some of them quite calcified.  Because of that we proceeded immediately with  a corpectomy of C5 and C6.  The patient had quite a bit of epidural veins.  The bone was quite a bit osteoporotic and had quite a bit of bleeding.  During the procedure we had to replace it with two units of packed cells.  We started decompression.  The disc had some areas which were soft.  The  disc mostly at the level of the right side of the level of 5-6 was  calcified.  With the use of the micro instrument I tried to develop a plane  between the ligament and the dura matter but there was such attachment  between both tissues that it was difficult.  Because of that we started  using the 1 mm Kerrison punch and the drill and we were able to remove the  calcified ligament along with the dura matter.  The patient had quite a bit  of disc material going into the foramen.  The most difficult part was trying  to find the C7 nerve root on the left side.  Nevertheless, decompression was  achieved.  At the end the spinal cord was pulsatile.  We had decompression  of the spinal cord.  The space above C4 was open, below C7 was open.  Then  we had good decompression of the spinal cord.  We used Duragen to cover the  dura matter.  Then PEEK  cage with autograph was inserted.  This was followed  by a plate using four screws.  We waited at least 10 minutes to be sure  there was not any bleeding.  Since we had good hemostasis, nevertheless we  left a Jackson-Pratt drain in the cervical area. The wound was closed with  Vicryl and Steri-strips.  Immediately after the procedure we turned the  patient on her side and Dr. Katrinka Blazing from the anesthesia department introduced  a lumbar catheter for spinal fluid drain.  The patient is going to be  extubated.  We are going to __________ .  We talked before surgery with this  lady that she might require rehabilitation.           ______________________________  Hilda Lias, M.D.     EB/MEDQ  D:  03/20/2006  T:  03/21/2006  Job:  [id number]

## 2011-01-17 NOTE — Op Note (Signed)
[name], [name] NO.:  [id number]   MEDICAL RECORD NO.:  [id number]          PATIENT TYPE:  AMB   LOCATION:  NESC                         FAC[facility]   PHYSICIAN:  Ollen Gross, M.D.    DATE OF BIRTH:  11-23-1940   DATE OF PROCEDURE:  01/06/2005  DATE OF DISCHARGE:                                 OPERATIVE REPORT   PREOPERATIVE DIAGNOSES:  Right knee medial meniscal tear and chondral  defect.   POSTOPERATIVE DIAGNOSES:  Right knee medial meniscal tear and chondral  defect.   PROCEDURE:  Right knee arthroscopy with meniscal debridement and  chondroplasty.   SURGEON:  Ollen Gross, M.D.   ASSISTANT:  None.   ANESTHESIA:  Local with MAC.   ESTIMATED BLOOD LOSS:  Minimal.   DRAINS:  None.   COMPLICATION:  None.   CONDITION:  Stable to recovery.   BRIEF CLINICAL NOTE:  [name] is a 70 year old female, several week to  month history of significant right knee pain and mechanical symptoms. Exam  and history are consistent with meniscal tear confirmed by MRI. She presents  now for arthroscopy and debridement.   PROCEDURE IN DETAIL:  After successful initiation of local with MAC  anesthetic, a tourniquet was placed high on the right thigh and right lower  extremity prepped and draped in usual sterile fashion. Standard superomedial  and inferolateral incisions are made, inflow cannula passed, superomedial  camera passed inferolateral. Arthroscopic visualization proceeds. The  undersurface of the patella showed some grade 1 and 2 chondromalacia with no  focal chondral defects. In the trochlea, there is a small central area about  5 x 5 mm with a small chondral defect otherwise the trochlea looks normal.  Medial and lateral gutters are visualized and flexion and valgus force  applied to the knee. The medial compartment is entered and she does have  grade 3 and smaller area of grade 4 chondromalacia in the medial femoral  condyle. The medial  meniscus is also torn. A spinal needle is used to  localize the inferomedial portal, a small incision made, dilator placed and  then the meniscus debrided back to a stable base with baskets and a 4.2 mm  shaver. The unstable cartilage on the surface of the medial femoral condyle  is then debrided back to a stable cartilaginous base with the 4.2 mm shaver.  The condyle is stable as is the meniscus post debridement. The intercondylar  notch is visualized, the ACL looks normal. The lateral compartment is also  normal. We then addressed the small chondral defect in the trochlea which  was debrided back to stable bony base with stable cartilaginous edges. The  arthroscopic equipment is then removed the inferior portals which are closed  with interrupted 4-0 nylon.  20 mL of 0.25% Marcaine with epi are injected through the inflow cannula and  that cannula was removed and that portal closed with nylon. A bulky sterile  dressing is applied and she is awakened and transferred to recovery in  stable condition.      FA/MEDQ  D:  01/06/2005  T:  01/06/2005  Job:  [id number]

## 2011-05-26 LAB — BASIC METABOLIC PANEL
CO2: 28
Calcium: 8.4
Chloride: 101
GFR calc Af Amer: >60
GFR calc Af Amer: >60
Glucose, Bld: 109 — ABNORMAL HIGH
Potassium: 3.7
Potassium: 4.5

## 2011-05-26 LAB — CBC
HCT: 34.5 — ABNORMAL LOW
HCT: 36.8
Hemoglobin: 12
MCV: 90.5
MCV: 90.8
Platelets: 184
RBC: 3.81 — ABNORMAL LOW
RDW: 13.2
WBC: 10
WBC: 11.8 — ABNORMAL HIGH

## 2011-05-26 LAB — TROPONIN I: Troponin I: 0.04

## 2011-05-26 LAB — DIFFERENTIAL
Basophils Absolute: 0
Eosinophils Absolute: 0
Monocytes Absolute: 0.5
Neutro Abs: 7.6

## 2011-05-26 LAB — CK TOTAL AND CKMB (NOT AT ARMC)
CK, MB: 2.2
Relative Index: INVALID

## 2011-05-26 LAB — LIPID PANEL
Cholesterol: 158
LDL Cholesterol: 80
Triglycerides: 174 — ABNORMAL HIGH

## 2011-05-26 LAB — HEMOGLOBIN A1C: Mean Plasma Glucose: 129

## 2011-05-26 LAB — POCT CARDIAC MARKERS
CKMB, poc: <1 — ABNORMAL LOW
Myoglobin, poc: 52.8

## 2011-05-27 LAB — BASIC METABOLIC PANEL
CO2: 29
Calcium: 8.3 — ABNORMAL LOW
Chloride: 102
GFR calc Af Amer: >60
Potassium: 3.9
Sodium: 138

## 2011-05-27 LAB — CBC
HCT: 35.8 — ABNORMAL LOW
Hemoglobin: 12.4
MCHC: 34.5
MCV: 91.4
RBC: 3.92

## 2011-06-12 HISTORY — PX: OTHER SURGICAL HISTORY: SHX169

## 2011-07-21 ENCOUNTER — Other Ambulatory Visit: Payer: Self-pay | Admitting: Neurosurgery

## 2011-07-21 DIAGNOSIS — IMO0002 Reserved for concepts with insufficient information to code with codable children: Secondary | ICD-10-CM

## 2011-07-21 DIAGNOSIS — M545 Low back pain: Secondary | ICD-10-CM

## 2011-07-30 ENCOUNTER — Other Ambulatory Visit: Payer: Medicare Other

## 2011-07-30 ENCOUNTER — Inpatient Hospital Stay
Admission: RE | Admit: 2011-07-30 | Discharge: 2011-07-30 | Payer: Medicare Other | Source: Ambulatory Visit | Attending: Neurosurgery | Admitting: Neurosurgery

## 2011-10-09 ENCOUNTER — Other Ambulatory Visit: Payer: Self-pay | Admitting: Neurosurgery

## 2011-10-09 DIAGNOSIS — IMO0002 Reserved for concepts with insufficient information to code with codable children: Secondary | ICD-10-CM

## 2011-10-09 DIAGNOSIS — M545 Low back pain: Secondary | ICD-10-CM

## 2011-10-15 DIAGNOSIS — I442 Atrioventricular block, complete: Secondary | ICD-10-CM | POA: Diagnosis not present

## 2011-10-15 DIAGNOSIS — Z45018 Encounter for adjustment and management of other part of cardiac pacemaker: Secondary | ICD-10-CM | POA: Diagnosis not present

## 2011-10-22 ENCOUNTER — Ambulatory Visit
Admission: RE | Admit: 2011-10-22 | Discharge: 2011-10-22 | Disposition: A | Discharge status: Home / Self Care | Payer: Medicare Other | Source: Ambulatory Visit | Attending: Neurosurgery | Admitting: Neurosurgery

## 2011-10-22 DIAGNOSIS — IMO0002 Reserved for concepts with insufficient information to code with codable children: Secondary | ICD-10-CM

## 2011-10-22 DIAGNOSIS — M5126 Other intervertebral disc displacement, lumbar region: Secondary | ICD-10-CM | POA: Diagnosis not present

## 2011-10-22 DIAGNOSIS — M47817 Spondylosis without myelopathy or radiculopathy, lumbosacral region: Secondary | ICD-10-CM | POA: Diagnosis not present

## 2011-10-22 DIAGNOSIS — M545 Low back pain: Secondary | ICD-10-CM

## 2011-10-22 MED ORDER — DIAZEPAM 5 MG PO TABS
5.0000 mg | ORAL_TABLET | Freq: Once | ORAL | Status: AC
  Administered 2011-10-22: 5 mg via ORAL

## 2011-10-22 MED ORDER — MEPERIDINE HCL 100 MG/ML IJ SOLN
75.0000 mg | Freq: Once | INTRAMUSCULAR | Status: AC
  Administered 2011-10-22: 75 mg via INTRAMUSCULAR

## 2011-10-22 MED ORDER — ONDANSETRON HCL 4 MG/2ML IJ SOLN
4.0000 mg | Freq: Once | INTRAMUSCULAR | Status: AC
  Administered 2011-10-22: 4 mg via INTRAMUSCULAR

## 2011-10-22 NOTE — Progress Notes (Signed)
Patient's daughter notified of 1245-1300 discharge time.  jkl

## 2011-10-22 NOTE — Discharge Instructions (Signed)

## 2011-10-30 DIAGNOSIS — M48061 Spinal stenosis, lumbar region without neurogenic claudication: Secondary | ICD-10-CM | POA: Diagnosis not present

## 2011-12-31 ENCOUNTER — Other Ambulatory Visit: Payer: Self-pay | Admitting: Family Medicine

## 2011-12-31 ENCOUNTER — Other Ambulatory Visit (HOSPITAL_COMMUNITY)
Admission: RE | Admit: 2011-12-31 | Discharge: 2011-12-31 | Disposition: A | Discharge status: Home / Self Care | Payer: Medicare Other | Source: Ambulatory Visit | Attending: Family Medicine | Admitting: Family Medicine

## 2011-12-31 DIAGNOSIS — M48061 Spinal stenosis, lumbar region without neurogenic claudication: Secondary | ICD-10-CM | POA: Diagnosis not present

## 2011-12-31 DIAGNOSIS — Z124 Encounter for screening for malignant neoplasm of cervix: Secondary | ICD-10-CM | POA: Insufficient documentation

## 2011-12-31 DIAGNOSIS — K625 Hemorrhage of anus and rectum: Secondary | ICD-10-CM | POA: Diagnosis not present

## 2011-12-31 DIAGNOSIS — E7800 Pure hypercholesterolemia, unspecified: Secondary | ICD-10-CM | POA: Diagnosis not present

## 2011-12-31 DIAGNOSIS — I10 Essential (primary) hypertension: Secondary | ICD-10-CM | POA: Diagnosis not present

## 2012-02-13 ENCOUNTER — Other Ambulatory Visit: Payer: Self-pay | Admitting: Neurosurgery

## 2012-02-26 ENCOUNTER — Encounter (HOSPITAL_COMMUNITY): Payer: Self-pay | Admitting: Pharmacy Technician

## 2012-02-27 ENCOUNTER — Encounter (HOSPITAL_COMMUNITY)
Admission: RE | Admit: 2012-02-27 | Discharge: 2012-02-27 | Disposition: A | Discharge status: Home / Self Care | Payer: Medicare Other | Source: Ambulatory Visit | Attending: Neurosurgery | Admitting: Neurosurgery

## 2012-02-27 ENCOUNTER — Encounter (HOSPITAL_COMMUNITY): Payer: Self-pay

## 2012-02-27 ENCOUNTER — Ambulatory Visit (HOSPITAL_COMMUNITY)
Admission: RE | Admit: 2012-02-27 | Discharge: 2012-02-27 | Disposition: A | Discharge status: Home / Self Care | Payer: Medicare Other | Source: Ambulatory Visit | Attending: Neurosurgery | Admitting: Neurosurgery

## 2012-02-27 DIAGNOSIS — Z01818 Encounter for other preprocedural examination: Secondary | ICD-10-CM | POA: Diagnosis not present

## 2012-02-27 DIAGNOSIS — Z01811 Encounter for preprocedural respiratory examination: Secondary | ICD-10-CM | POA: Diagnosis not present

## 2012-02-27 DIAGNOSIS — I495 Sick sinus syndrome: Secondary | ICD-10-CM | POA: Diagnosis not present

## 2012-02-27 DIAGNOSIS — I517 Cardiomegaly: Secondary | ICD-10-CM | POA: Insufficient documentation

## 2012-02-27 DIAGNOSIS — Z01812 Encounter for preprocedural laboratory examination: Secondary | ICD-10-CM | POA: Diagnosis not present

## 2012-02-27 HISTORY — DX: Atherosclerotic heart disease of native coronary artery without angina pectoris: I25.10

## 2012-02-27 HISTORY — DX: Unspecified mononeuropathy of bilateral lower limbs: G57.93

## 2012-02-27 HISTORY — DX: Cardiac arrhythmia, unspecified: I49.9

## 2012-02-27 HISTORY — DX: Presence of cardiac pacemaker: Z95.0

## 2012-02-27 HISTORY — DX: Pure hypercholesterolemia, unspecified: E78.00

## 2012-02-27 HISTORY — DX: Shortness of breath: R06.02

## 2012-02-27 HISTORY — DX: Essential (primary) hypertension: I10

## 2012-02-27 LAB — BASIC METABOLIC PANEL
BUN: 19 mg/dL (ref 6–23)
CO2: 31 mEq/L (ref 19–32)
GFR calc non Af Amer: 89 mL/min — ABNORMAL LOW (ref >90)
Glucose, Bld: 134 mg/dL — ABNORMAL HIGH (ref 70–99)
Potassium: 4 mEq/L (ref 3.5–5.1)
Sodium: 144 mEq/L (ref 135–145)

## 2012-02-27 LAB — CBC
HCT: 39.1 % (ref 36.0–46.0)
Hemoglobin: 13.1 g/dL (ref 12.0–15.0)
MCH: 30.3 pg (ref 26.0–34.0)
MCHC: 33.5 g/dL (ref 30.0–36.0)
MCV: 90.3 fL (ref 78.0–100.0)
RBC: 4.33 MIL/uL (ref 3.87–5.11)

## 2012-02-27 LAB — SURGICAL PCR SCREEN: MRSA, PCR: NEGATIVE

## 2012-02-27 NOTE — Pre-Procedure Instructions (Signed)
20 [name]  02/27/2012   Your procedure is scheduled on:  Tuesday, July 2  Report to [facility] at 0530 AM.  Call this number if you have problems the morning of surgery: [phone]   Remember:   Do not eat food:After Midnight.  May have clear liquids:until Midnight .  Clear liquids include soda, tea, black coffee, apple or grape juice, broth.  Take these medicines the morning of surgery with A SIP OF WATER: *Bisoprolol**   Do not wear jewelry, make-up or nail polish.  Do not wear lotions, powders, or perfumes. You may wear deodorant.  Do not shave 48 hours prior to surgery. Men may shave face and neck.  Do not bring valuables to the hospital.  Contacts, dentures or bridgework may not be worn into surgery.  Leave suitcase in the car. After surgery it may be brought to your room.  For patients admitted to the hospital, checkout time is 11:00 AM the day of discharge.   Patients discharged the day of surgery will not be allowed to drive home.  Name and phone number of your driver: *n/a**  Special Instructions: CHG Shower Use Special Wash: 1/2 bottle night before surgery and 1/2 bottle morning of surgery.   Please read over the following fact sheets that you were given: Pain Booklet, Coughing and Deep Breathing, MRSA Information and Surgical Site Infection Prevention

## 2012-02-27 NOTE — Progress Notes (Signed)
Pt seen in Pacemaker clinic @ [facility] today 6/28; [employer] representative aware of pt surgery.Pts cardiologist, Dr Ivor Messier is out of town until 03-09-2012

## 2012-03-01 ENCOUNTER — Encounter (HOSPITAL_COMMUNITY): Payer: Self-pay | Admitting: Vascular Surgery

## 2012-03-01 MED ORDER — CEFAZOLIN SODIUM-DEXTROSE 2-3 GM-% IV SOLR
2.0000 g | INTRAVENOUS | Status: AC
  Administered 2012-03-02: 2 g via INTRAVENOUS
  Filled 2012-03-01 (×2): qty 50

## 2012-03-01 NOTE — Consult Note (Signed)
Anesthesia Chart Review:  Patient is a 71 year old female scheduled for L3-5 laminectomy on 03/02/12 by Dr. Jeral Fruit.  History includes former smoker, HTN, normal coronaries by 2002 cath, hypercholesterolemia, DM2 diet controlled, peripheral neuropathy, venous insufficiency, CHB s/p St. Jude PPM '09, prior ACDF.  Cardiologist is Dr. Royann Shivers [facility]).  She was last seen on 05/23/11.  EKG then was AV paced.  Her last PPM check was on 02/27/12.  Her last stress test was on 10/24/08 and showed normal myocardial perfusion with EF 63%.  Echo from 11/30/07 showed overall left ventricular systolic function was normal with no left ventricular regional wall motion abnormalities, left ventricular wall thickness was at the upper limits of normal, and mean transaortic valve gradient was 10 mmHg.  CXR on 02/27/12 showed no active disease. Cardiomegaly again noted. Dual lead cardiac pacemaker in place.  Labs noted.  Plan to proceed.  PAT RN already notified the St. Jude representative of planned procedure.  Shonna Chock, PA-C .

## 2012-03-02 ENCOUNTER — Inpatient Hospital Stay (HOSPITAL_COMMUNITY)
Admission: RE | Admit: 2012-03-02 | Discharge: 2012-03-07 | DRG: 491 | Disposition: A | Discharge status: Home / Self Care | Payer: Medicare Other | Source: Ambulatory Visit | Attending: Neurosurgery | Admitting: Neurosurgery

## 2012-03-02 ENCOUNTER — Encounter (HOSPITAL_COMMUNITY): Payer: Self-pay | Admitting: *Deleted

## 2012-03-02 ENCOUNTER — Encounter (HOSPITAL_COMMUNITY): Payer: Self-pay | Admitting: Vascular Surgery

## 2012-03-02 ENCOUNTER — Encounter (HOSPITAL_COMMUNITY)
Admission: RE | Disposition: A | Discharge status: Home / Self Care | Payer: Self-pay | Source: Ambulatory Visit | Attending: Neurosurgery

## 2012-03-02 ENCOUNTER — Ambulatory Visit (HOSPITAL_COMMUNITY): Payer: Medicare Other | Admitting: Vascular Surgery

## 2012-03-02 ENCOUNTER — Encounter (HOSPITAL_COMMUNITY): Payer: Self-pay | Admitting: General Practice

## 2012-03-02 ENCOUNTER — Ambulatory Visit (HOSPITAL_COMMUNITY): Payer: Medicare Other

## 2012-03-02 DIAGNOSIS — Z7982 Long term (current) use of aspirin: Secondary | ICD-10-CM | POA: Diagnosis not present

## 2012-03-02 DIAGNOSIS — Z950 Presence of cardiac pacemaker: Secondary | ICD-10-CM

## 2012-03-02 DIAGNOSIS — I10 Essential (primary) hypertension: Secondary | ICD-10-CM | POA: Diagnosis not present

## 2012-03-02 DIAGNOSIS — Z981 Arthrodesis status: Secondary | ICD-10-CM | POA: Diagnosis not present

## 2012-03-02 DIAGNOSIS — M48062 Spinal stenosis, lumbar region with neurogenic claudication: Secondary | ICD-10-CM

## 2012-03-02 DIAGNOSIS — I2510 Atherosclerotic heart disease of native coronary artery without angina pectoris: Secondary | ICD-10-CM | POA: Diagnosis present

## 2012-03-02 DIAGNOSIS — E7800 Pure hypercholesterolemia, unspecified: Secondary | ICD-10-CM | POA: Diagnosis present

## 2012-03-02 DIAGNOSIS — M48061 Spinal stenosis, lumbar region without neurogenic claudication: Secondary | ICD-10-CM | POA: Diagnosis not present

## 2012-03-02 DIAGNOSIS — E119 Type 2 diabetes mellitus without complications: Secondary | ICD-10-CM | POA: Diagnosis present

## 2012-03-02 DIAGNOSIS — Z87891 Personal history of nicotine dependence: Secondary | ICD-10-CM | POA: Diagnosis not present

## 2012-03-02 DIAGNOSIS — IMO0002 Reserved for concepts with insufficient information to code with codable children: Secondary | ICD-10-CM | POA: Diagnosis not present

## 2012-03-02 DIAGNOSIS — M5137 Other intervertebral disc degeneration, lumbosacral region: Secondary | ICD-10-CM | POA: Diagnosis not present

## 2012-03-02 HISTORY — PX: LUMBAR LAMINECTOMY/DECOMPRESSION MICRODISCECTOMY: SHX5026

## 2012-03-02 HISTORY — DX: Unspecified osteoarthritis, unspecified site: M19.90

## 2012-03-02 SURGERY — LUMBAR LAMINECTOMY/DECOMPRESSION MICRODISCECTOMY 2 LEVELS
Anesthesia: General | Site: Back | Laterality: Bilateral | Wound class: Clean

## 2012-03-02 MED ORDER — HYDROCHLOROTHIAZIDE 12.5 MG PO CAPS
12.5000 mg | ORAL_CAPSULE | Freq: Every day | ORAL | Status: DC
  Administered 2012-03-02 – 2012-03-07 (×6): 12.5 mg via ORAL
  Filled 2012-03-02 (×6): qty 1

## 2012-03-02 MED ORDER — MENTHOL 3 MG MT LOZG
1.0000 | LOZENGE | OROMUCOSAL | Status: DC | PRN
  Administered 2012-03-02: 3 mg via ORAL
  Filled 2012-03-02: qty 9

## 2012-03-02 MED ORDER — HYDROMORPHONE HCL PF 1 MG/ML IJ SOLN
INTRAMUSCULAR | Status: AC
  Filled 2012-03-02: qty 1

## 2012-03-02 MED ORDER — ACETAMINOPHEN 650 MG RE SUPP: 650.0000 mg | RECTAL | Status: DC | PRN

## 2012-03-02 MED ORDER — THROMBIN 5000 UNITS EX KIT
PACK | CUTANEOUS | Status: DC | PRN
  Administered 2012-03-02 (×2): 5000 [IU] via TOPICAL

## 2012-03-02 MED ORDER — OXYCODONE-ACETAMINOPHEN 5-325 MG PO TABS
1.0000 | ORAL_TABLET | ORAL | Status: DC | PRN
  Administered 2012-03-02 – 2012-03-03 (×6): 2 via ORAL
  Administered 2012-03-04: 1 via ORAL
  Administered 2012-03-04 – 2012-03-06 (×8): 2 via ORAL
  Administered 2012-03-06: 1 via ORAL
  Administered 2012-03-06: 2 via ORAL
  Administered 2012-03-06: 1 via ORAL
  Administered 2012-03-06 – 2012-03-07 (×3): 2 via ORAL
  Filled 2012-03-02 (×14): qty 2
  Filled 2012-03-02: qty 1
  Filled 2012-03-02 (×6): qty 2
  Filled 2012-03-02: qty 1
  Filled 2012-03-02: qty 2

## 2012-03-02 MED ORDER — SODIUM CHLORIDE 0.9 % IJ SOLN
3.0000 mL | Freq: Two times a day (BID) | INTRAMUSCULAR | Status: DC
  Administered 2012-03-03 – 2012-03-07 (×8): 3 mL via INTRAVENOUS

## 2012-03-02 MED ORDER — PHENYLEPHRINE HCL 10 MG/ML IJ SOLN
INTRAMUSCULAR | Status: DC | PRN
  Administered 2012-03-02: 80 ug via INTRAVENOUS

## 2012-03-02 MED ORDER — SIMVASTATIN 5 MG PO TABS
5.0000 mg | ORAL_TABLET | Freq: Every day | ORAL | Status: DC
  Administered 2012-03-02 – 2012-03-06 (×5): 5 mg via ORAL
  Filled 2012-03-02 (×7): qty 1

## 2012-03-02 MED ORDER — BISOPROLOL FUMARATE 5 MG PO TABS
5.0000 mg | ORAL_TABLET | Freq: Every day | ORAL | Status: DC
  Administered 2012-03-03 – 2012-03-07 (×5): 5 mg via ORAL
  Filled 2012-03-02 (×6): qty 1

## 2012-03-02 MED ORDER — SODIUM CHLORIDE 0.9 % IJ SOLN: 3.0000 mL | INTRAMUSCULAR | Status: DC | PRN

## 2012-03-02 MED ORDER — BUPIVACAINE HCL (PF) 0.5 % IJ SOLN
INTRAMUSCULAR | Status: DC | PRN
  Administered 2012-03-02: 20 mL

## 2012-03-02 MED ORDER — ONDANSETRON HCL 4 MG/2ML IJ SOLN: 4.0000 mg | Freq: Once | INTRAMUSCULAR | Status: DC | PRN

## 2012-03-02 MED ORDER — ACETAMINOPHEN 325 MG PO TABS
650.0000 mg | ORAL_TABLET | ORAL | Status: DC | PRN
  Administered 2012-03-04: 650 mg via ORAL
  Filled 2012-03-02: qty 2

## 2012-03-02 MED ORDER — ROCURONIUM BROMIDE 100 MG/10ML IV SOLN
INTRAVENOUS | Status: DC | PRN
  Administered 2012-03-02: 40 mg via INTRAVENOUS

## 2012-03-02 MED ORDER — LISINOPRIL-HYDROCHLOROTHIAZIDE 20-12.5 MG PO TABS: 1.0000 | ORAL_TABLET | Freq: Every day | ORAL | Status: DC

## 2012-03-02 MED ORDER — ONDANSETRON HCL 4 MG/2ML IJ SOLN
INTRAMUSCULAR | Status: DC | PRN
  Administered 2012-03-02: 4 mg via INTRAVENOUS

## 2012-03-02 MED ORDER — LACTATED RINGERS IV SOLN
INTRAVENOUS | Status: DC | PRN
  Administered 2012-03-02 (×2): via INTRAVENOUS

## 2012-03-02 MED ORDER — ALUM & MAG HYDROXIDE-SIMETH 200-200-20 MG/5ML PO SUSP
30.0000 mL | ORAL | Status: DC | PRN
  Administered 2012-03-02: 30 mL via ORAL
  Filled 2012-03-02: qty 30

## 2012-03-02 MED ORDER — SODIUM CHLORIDE 0.9 % IV SOLN: INTRAVENOUS | Status: DC

## 2012-03-02 MED ORDER — 0.9 % SODIUM CHLORIDE (POUR BTL) OPTIME
TOPICAL | Status: DC | PRN
  Administered 2012-03-02: 1000 mL

## 2012-03-02 MED ORDER — LIDOCAINE HCL (CARDIAC) 20 MG/ML IV SOLN
INTRAVENOUS | Status: DC | PRN
  Administered 2012-03-02: 40 mg via INTRAVENOUS
  Administered 2012-03-02: 10 mg via INTRAVENOUS

## 2012-03-02 MED ORDER — PHENOL 1.4 % MT LIQD: 1.0000 | OROMUCOSAL | Status: DC | PRN

## 2012-03-02 MED ORDER — SODIUM CHLORIDE 0.9 % IV SOLN
250.0000 mL | INTRAVENOUS | Status: DC
  Administered 2012-03-02: 1000 mL via INTRAVENOUS

## 2012-03-02 MED ORDER — HEMOSTATIC AGENTS (NO CHARGE) OPTIME
TOPICAL | Status: DC | PRN
  Administered 2012-03-02: 1 via TOPICAL

## 2012-03-02 MED ORDER — PROPOFOL 10 MG/ML IV EMUL
INTRAVENOUS | Status: DC | PRN
  Administered 2012-03-02: 100 mg via INTRAVENOUS

## 2012-03-02 MED ORDER — LISINOPRIL 20 MG PO TABS
20.0000 mg | ORAL_TABLET | Freq: Every day | ORAL | Status: DC
  Administered 2012-03-02 – 2012-03-07 (×5): 20 mg via ORAL
  Filled 2012-03-02 (×6): qty 1

## 2012-03-02 MED ORDER — HYDROMORPHONE HCL PF 1 MG/ML IJ SOLN
0.2500 mg | INTRAMUSCULAR | Status: DC | PRN
  Administered 2012-03-02: 0.5 mg via INTRAVENOUS

## 2012-03-02 MED ORDER — CEFAZOLIN SODIUM 1-5 GM-% IV SOLN
1.0000 g | Freq: Three times a day (TID) | INTRAVENOUS | Status: AC
  Administered 2012-03-02 (×2): 1 g via INTRAVENOUS
  Filled 2012-03-02 (×2): qty 50

## 2012-03-02 MED ORDER — DIAZEPAM 5 MG PO TABS
5.0000 mg | ORAL_TABLET | Freq: Four times a day (QID) | ORAL | Status: DC | PRN
  Administered 2012-03-02 – 2012-03-07 (×7): 5 mg via ORAL
  Filled 2012-03-02 (×8): qty 1

## 2012-03-02 MED ORDER — MORPHINE SULFATE 4 MG/ML IJ SOLN
4.0000 mg | INTRAMUSCULAR | Status: DC | PRN
  Administered 2012-03-02 – 2012-03-06 (×2): 4 mg via INTRAVENOUS
  Filled 2012-03-02 (×2): qty 1

## 2012-03-02 MED ORDER — ONDANSETRON HCL 4 MG/2ML IJ SOLN: 4.0000 mg | INTRAMUSCULAR | Status: DC | PRN

## 2012-03-02 MED ORDER — FENTANYL CITRATE 0.05 MG/ML IJ SOLN
INTRAMUSCULAR | Status: DC | PRN
  Administered 2012-03-02: 150 ug via INTRAVENOUS
  Administered 2012-03-02: 50 ug via INTRAVENOUS

## 2012-03-02 MED ORDER — MIDAZOLAM HCL 5 MG/5ML IJ SOLN
INTRAMUSCULAR | Status: DC | PRN
  Administered 2012-03-02: 1 mg via INTRAVENOUS

## 2012-03-02 MED ORDER — ZOLPIDEM TARTRATE 5 MG PO TABS: 5.0000 mg | ORAL_TABLET | Freq: Every evening | ORAL | Status: DC | PRN

## 2012-03-02 SURGICAL SUPPLY — 61 items
APL SKNCLS STERI-STRIP NONHPOA (GAUZE/BANDAGES/DRESSINGS) ×1
BENZOIN TINCTURE PRP APPL 2/3 (GAUZE/BANDAGES/DRESSINGS) ×2 IMPLANT
BLADE SURG ROTATE 9660 (MISCELLANEOUS) IMPLANT
BUR ACORN 6.0 (BURR) ×1 IMPLANT
BUR MATCHSTICK NEURO 3.0 LAGG (BURR) ×2 IMPLANT
CANISTER SUCTION 2500CC (MISCELLANEOUS) ×2 IMPLANT
CLOTH BEACON ORANGE TIMEOUT ST (SAFETY) ×2 IMPLANT
CONT SPEC 4OZ CLIKSEAL STRL BL (MISCELLANEOUS) ×2 IMPLANT
DRAPE LAPAROTOMY 100X72X124 (DRAPES) ×2 IMPLANT
DRAPE MICROSCOPE LEICA (MISCELLANEOUS) ×3 IMPLANT
DRAPE POUCH INSTRU U-SHP 10X18 (DRAPES) ×2 IMPLANT
DRSG PAD ABDOMINAL 8X10 ST (GAUZE/BANDAGES/DRESSINGS) IMPLANT
DURAPREP 26ML APPLICATOR (WOUND CARE) ×2 IMPLANT
ELECT BLADE 4.0 EZ CLEAN MEGAD (MISCELLANEOUS) ×2
ELECT REM PT RETURN 9FT ADLT (ELECTROSURGICAL) ×2
ELECTRODE BLDE 4.0 EZ CLN MEGD (MISCELLANEOUS) IMPLANT
ELECTRODE REM PT RTRN 9FT ADLT (ELECTROSURGICAL) ×1 IMPLANT
EVACUATOR 3/16  PVC DRAIN (DRAIN) ×1
EVACUATOR 3/16 PVC DRAIN (DRAIN) IMPLANT
GAUZE SPONGE 4X4 16PLY XRAY LF (GAUZE/BANDAGES/DRESSINGS) IMPLANT
GLOVE BIOGEL M 8.0 STRL (GLOVE) ×2 IMPLANT
GLOVE BIOGEL PI IND STRL 8 (GLOVE) ×2 IMPLANT
GLOVE BIOGEL PI INDICATOR 8 (GLOVE) ×2
GLOVE ECLIPSE 7.5 STRL STRAW (GLOVE) ×4 IMPLANT
GLOVE EXAM NITRILE LRG STRL (GLOVE) IMPLANT
GLOVE EXAM NITRILE MD LF STRL (GLOVE) IMPLANT
GLOVE EXAM NITRILE XL STR (GLOVE) IMPLANT
GLOVE EXAM NITRILE XS STR PU (GLOVE) IMPLANT
GOWN BRE IMP SLV AUR LG STRL (GOWN DISPOSABLE) ×4 IMPLANT
GOWN BRE IMP SLV AUR XL STRL (GOWN DISPOSABLE) IMPLANT
GOWN STRL REIN 2XL LVL4 (GOWN DISPOSABLE) ×2 IMPLANT
KIT BASIN OR (CUSTOM PROCEDURE TRAY) ×2 IMPLANT
KIT ROOM TURNOVER OR (KITS) ×2 IMPLANT
NDL HYPO 18GX1.5 BLUNT FILL (NEEDLE) IMPLANT
NDL HYPO 21X1.5 SAFETY (NEEDLE) IMPLANT
NDL HYPO 25X1 1.5 SAFETY (NEEDLE) IMPLANT
NDL SPNL 20GX3.5 QUINCKE YW (NEEDLE) IMPLANT
NEEDLE HYPO 18GX1.5 BLUNT FILL (NEEDLE) IMPLANT
NEEDLE HYPO 21X1.5 SAFETY (NEEDLE) IMPLANT
NEEDLE HYPO 25X1 1.5 SAFETY (NEEDLE) ×2 IMPLANT
NEEDLE SPNL 20GX3.5 QUINCKE YW (NEEDLE) ×2 IMPLANT
NS IRRIG 1000ML POUR BTL (IV SOLUTION) ×2 IMPLANT
PACK LAMINECTOMY NEURO (CUSTOM PROCEDURE TRAY) ×2 IMPLANT
PAD ARMBOARD 7.5X6 YLW CONV (MISCELLANEOUS) ×6 IMPLANT
PATTIES SURGICAL .5 X1 (DISPOSABLE) ×2 IMPLANT
RUBBERBAND STERILE (MISCELLANEOUS) ×4 IMPLANT
SPONGE GAUZE 4X4 12PLY (GAUZE/BANDAGES/DRESSINGS) ×2 IMPLANT
SPONGE LAP 4X18 X RAY DECT (DISPOSABLE) IMPLANT
SPONGE SURGIFOAM ABS GEL SZ50 (HEMOSTASIS) ×2 IMPLANT
STRIP CLOSURE SKIN 1/2X4 (GAUZE/BANDAGES/DRESSINGS) ×2 IMPLANT
SUT VIC AB 0 CT1 18XCR BRD8 (SUTURE) ×1 IMPLANT
SUT VIC AB 0 CT1 8-18 (SUTURE) ×2
SUT VIC AB 2-0 CP2 18 (SUTURE) ×2 IMPLANT
SUT VIC AB 3-0 SH 8-18 (SUTURE) ×2 IMPLANT
SYR 20CC LL (SYRINGE) IMPLANT
SYR 20ML ECCENTRIC (SYRINGE) ×2 IMPLANT
SYR 5ML LL (SYRINGE) IMPLANT
TAPE CLOTH SURG 4X10 WHT LF (GAUZE/BANDAGES/DRESSINGS) ×2 IMPLANT
TOWEL OR 17X24 6PK STRL BLUE (TOWEL DISPOSABLE) ×2 IMPLANT
TOWEL OR 17X26 10 PK STRL BLUE (TOWEL DISPOSABLE) ×2 IMPLANT
WATER STERILE IRR 1000ML POUR (IV SOLUTION) ×2 IMPLANT

## 2012-03-02 NOTE — Transfer of Care (Signed)
Immediate Anesthesia Transfer of Care Note  Patient: [name]  Procedure(s) Performed: Procedure(s) (LRB): LUMBAR LAMINECTOMY/DECOMPRESSION MICRODISCECTOMY 2 LEVELS (Bilateral)  Patient Location: PACU  Anesthesia Type: General  Level of Consciousness: sedated and patient cooperative  Airway & Oxygen Therapy: Patient Spontanous Breathing and Patient connected to face mask oxygen  Post-op Assessment: Report given to PACU RN and Patient moving all extremities X 4  Post vital signs: Reviewed and stable  Complications: No apparent anesthesia complications

## 2012-03-02 NOTE — H&P (Signed)
[name] is an 71 y.o. female.   Chief Complaint: lbp HPI: patient who came to see me 10/30/11 with the history of lbp with radiation to both lower extremities associated with sensory changes mostly with ambulation.had radiological w/u which showed stenosis at l345. In the past she had an acd with fusion  Past Medical History  Diagnosis Date  . Hypertension   . Hypercholesterolemia   . Pacemaker   . Dysrhythmia   . Diabetes mellitus     hga1c 7.1 no meds  . Neuropathy of both feet   . Shortness of breath     exertional  . Coronary artery disease     normal coronaries by 09/10/00 cath  . Venous insufficiency (chronic) (peripheral)     Past Surgical History  Procedure Date  . Cardiac pacemaker placement 12/2007    St Jude   . Anterior cervical decomp/discectomy fusion 2007  . Cholecystectomy 1995    History reviewed. No pertinent family history. Social History:  reports that she has quit smoking. Her smoking use included Cigarettes. She has a 40 pack-year smoking history. She has never used smokeless tobacco. She reports that she does not drink alcohol or use illicit drugs.  Allergies: No Known Allergies  Medications Prior to Admission  Medication Sig Dispense Refill  . aspirin EC 81 MG tablet Take 81 mg by mouth daily.      . bisoprolol (ZEBETA) 5 MG tablet Take 5 mg by mouth daily.      Marland Kitchen lisinopril-hydrochlorothiazide (PRINZIDE,ZESTORETIC) 20-12.5 MG per tablet Take 1 tablet by mouth daily.      . pravastatin (PRAVACHOL) 40 MG tablet Take 40 mg by mouth daily.        Results for orders placed during the hospital encounter of 03/02/12 (from the past 48 hour(s))  GLUCOSE, CAPILLARY     Status: Abnormal   Collection Time   03/02/12  6:08 AM      Component Value Range Comment   Glucose-Capillary 133 (*) 70 - 99 mg/dL    No results found.  Review of Systems  Constitutional: Negative.   HENT: Positive for neck pain.   Eyes: Negative.   Respiratory: Negative.     Cardiovascular: Negative.   Gastrointestinal: Negative.   Genitourinary: Negative.   Musculoskeletal: Positive for back pain.  Skin: Negative.   Neurological: Positive for focal weakness.  Endo/Heme/Allergies: Negative.   Psychiatric/Behavioral: Negative.     Blood pressure 126/77, pulse 60, temperature 99 F (37.2 C), temperature source Oral, resp. rate 18, SpO2 95.00%. Physical Exam hent,nl. Neck, anterior scar. Pain with lateralization. Cv, nl. Lungs, clear. Abdomen, soft. Extremities, graDE 1  Edema. NEURO weakness of feet df. slr positive at 60. Femoral maneuver, positive. Xrays, stenosis at l34 and l45.  Assessment/Plan Decompressive laminectomies at l34 and 45 as well as foraminotomies. Patient and family aware of risks and benefits  Sarai January M 03/02/2012, 7:43 AM

## 2012-03-02 NOTE — Anesthesia Procedure Notes (Signed)
Procedure Name: Intubation Date/Time: 03/02/2012 8:10 AM Performed by: Sherie Don Pre-anesthesia Checklist: Patient identified, Emergency Drugs available, Suction available, Patient being monitored and Timeout performed Patient Re-evaluated:Patient Re-evaluated prior to inductionOxygen Delivery Method: Circle system utilized Preoxygenation: Pre-oxygenation with 100% oxygen Intubation Type: IV induction Ventilation: Mask ventilation without difficulty and Oral airway inserted - appropriate to patient size Laryngoscope Size: Mac and 3 Grade View: Grade I Tube type: Oral Number of attempts: 1 Airway Equipment and Method: Stylet Placement Confirmation: ETT inserted through vocal cords under direct vision,  positive ETCO2 and breath sounds checked- equal and bilateral Secured at: 23 cm Tube secured with: Tape Dental Injury: Teeth and Oropharynx as per pre-operative assessment

## 2012-03-02 NOTE — Progress Notes (Signed)
Patient ID: [name], female   DOB: [birth year], 71 y.o.   MRN: [id number] C/o incisinal pain. No weakness.

## 2012-03-02 NOTE — Preoperative (Signed)
Beta Blockers   Reason not to administer Beta Blockers:Took betablocker this am 

## 2012-03-02 NOTE — Progress Notes (Signed)
Op note [id number] . Lumbar l3 tol5 laminectomies

## 2012-03-02 NOTE — Op Note (Signed)
NAMECAIRO, LINGENFELTER NO.:  [id number]  MEDICAL RECORD NO.:  [id number]  LOCATION:  3008                         FACILITY:  [facility]  PHYSICIAN:  Hilda Lias, M.D.   DATE OF BIRTH:  August 21, 1941  DATE OF PROCEDURE:  03/02/2012 DATE OF DISCHARGE:                              OPERATIVE REPORT   PREOPERATIVE DIAGNOSIS:  Lumbar stenosis at L3-4, 4-5, 5-1 with neurogenic claudication.  POSTOPERATIVE DIAGNOSIS:  Lumbar stenosis at L3-4, 4-5, 5-1 with neurogenic claudication.  PROCEDURE:  Bilateral L3, L4, L5 laminectomy, foraminotomy to decompress the L3-S1 nerve root.  Microscope.  SURGEON:  Hilda Lias, M.D.  ASSISTANT:  Clydene Fake, M.D.  CLINICAL HISTORY:  Mrs. Menter is a lady who in the past underwent cervical fusion because of calcification of the posterior ligament.  Now she had been complaining of back pain worsened in both legs which is getting worse with walking.  X-rays show severe stenosis at L3-4, 4-5, 5- 1.  In view of no better with conservative treatment, she was getting worse, surgery was advised.  The risks were fully explained to her and her family.  PROCEDURE:  The patient was taken to the OR, and she was positioned in a prone manner.  The back was cleaned with DuraPrep.  Drapes were applied. Midline incision from L3 down to L5-S1 was made through the skin, subcutaneous tissue, adipose tissue, straight to the fascia.  Muscles were retracted laterally.  X-rays showed that the clips were at the level of L3 and L4.  From then on, with the Leksell, we will remove the spinous process of L3, L4, L5, and using the drill, we started drilling the lamina bilaterally all the way down to the junction of the facet. Then with the Kerrison punch with the help of the microscope, we started doing the laminectomy to decompress the thecal sac removing not only the overgrowth of bone, but also the yellow ligament.  At the end, we had a good  decompression from L3, L5, S1.  Then, foraminotomy to decompress the L3-L4, L5-S1 nerve roots bilaterally.  At the end, we had good decompression.  Valsalva maneuver was negative.  From then on, the area was irrigated.  Hemovac was left in the epidural space, and the wound was closed with Vicryl and Steri-Strips.          ______________________________ Hilda Lias, M.D.     EB/MEDQ  D:  03/02/2012  T:  03/02/2012  Job:  [id number]

## 2012-03-02 NOTE — Anesthesia Preprocedure Evaluation (Addendum)
Anesthesia Evaluation  Patient identified by MRN, date of birth, ID band Patient awake    Reviewed: Allergy & Precautions, H&P , NPO status , Patient's Chart, lab work & pertinent test results  Airway Mallampati: II      Dental  (+) Edentulous Lower and Upper Dentures   Pulmonary shortness of breath,  breath sounds clear to auscultation        Cardiovascular hypertension, Pt. on home beta blockers + CAD - dysrhythmias + pacemaker Rhythm:Regular Rate:Normal     Neuro/Psych    GI/Hepatic   Endo/Other  Diabetes mellitus-  Renal/GU      Musculoskeletal   Abdominal   Peds  Hematology   Anesthesia Other Findings   Reproductive/Obstetrics                         Anesthesia Physical Anesthesia Plan  ASA: III  Anesthesia Plan: General   Post-op Pain Management:    Induction: Intravenous  Airway Management Planned: Oral ETT  Additional Equipment:   Intra-op Plan:   Post-operative Plan: Extubation in OR  Informed Consent: I have reviewed the patients History and Physical, chart, labs and discussed the procedure including the risks, benefits and alternatives for the proposed anesthesia with the patient or authorized representative who has indicated his/her understanding and acceptance.     Plan Discussed with:   Anesthesia Plan Comments: (H/O CHB pacer dependent Htn Type 2 DM glucose 133 Htn Nl coronaries bty cath 2003 Nl LV function by echo 2008 S/P ACDF  Kipp Brood, MD)        Anesthesia Quick Evaluation

## 2012-03-02 NOTE — Progress Notes (Signed)
Limited neck mobility

## 2012-03-02 NOTE — Progress Notes (Signed)
St. Jude representative notified of surgical time.

## 2012-03-02 NOTE — Anesthesia Postprocedure Evaluation (Signed)
  Anesthesia Post-op Note  Patient: [name]  Procedure(s) Performed: Procedure(s) (LRB): LUMBAR LAMINECTOMY/DECOMPRESSION MICRODISCECTOMY 2 LEVELS (Bilateral)  Patient Location: PACU  Anesthesia Type: General  Level of Consciousness: awake, alert  and oriented  Airway and Oxygen Therapy: Patient Spontanous Breathing  Post-op Pain: mild  Post-op Assessment: Post-op Vital signs reviewed and Patient's Cardiovascular Status Stable  Post-op Vital Signs: stable  Complications: No apparent anesthesia complications

## 2012-03-03 ENCOUNTER — Encounter (HOSPITAL_COMMUNITY): Payer: Self-pay | Admitting: Neurosurgery

## 2012-03-03 LAB — GLUCOSE, CAPILLARY: Glucose-Capillary: 125 mg/dL — ABNORMAL HIGH (ref 70–99)

## 2012-03-03 MED ORDER — BISACODYL 10 MG RE SUPP
10.0000 mg | Freq: Every day | RECTAL | Status: DC | PRN
  Administered 2012-03-04 – 2012-03-05 (×2): 10 mg via RECTAL
  Filled 2012-03-03 (×2): qty 1

## 2012-03-03 NOTE — Clinical Social Work Note (Signed)
CSW received consult for SNF. Discussed pt during progression with RN. PT is recommending home health PT. Will alert RNCM. CSW is signing off as no further needs identified. Please reconsult if a need arises prior to discharge.   Dede Query, MSW, Theresia Majors [phone]

## 2012-03-03 NOTE — Evaluation (Signed)
Occupational Therapy Evaluation Patient Details Name: [name] MRN: [id number] DOB: Jan 08, 1941 Today's Date: 03/03/2012 Time: 4098-1191 OT Time Calculation (min): 21 min  OT Assessment / Plan / Recommendation Clinical Impression  This 71 y.o. s/p L3-5 laminectomies.  Pt. demonstrates the below listed deficits and will benefit from OT to maximize safety and independence with BADLs to allow pt. to return home at supervision to modified independent level.      OT Assessment  Patient needs continued OT Services    Follow Up Recommendations  No OT follow up;Supervision - Intermittent    Barriers to Discharge None    Equipment Recommendations  3 in 1 bedside comode    Recommendations for Other Services    Frequency  Min 2X/week    Precautions / Restrictions Precautions Precautions: Back Precaution Booklet Issued: Yes (comment) Precaution Comments: reviewed precautions Restrictions Weight Bearing Restrictions: No       ADL  Eating/Feeding: Simulated;Independent Where Assessed - Eating/Feeding: Edge of bed Grooming: Simulated;Wash/dry hands;Wash/dry face;Teeth care;Min guard Where Assessed - Grooming: Supported standing Upper Body Bathing: Simulated;Supervision/safety Where Assessed - Upper Body Bathing: Supported sitting Lower Body Bathing: Simulated;Minimal assistance Where Assessed - Lower Body Bathing: Supported sit to stand Upper Body Dressing: Simulated;Supervision/safety Where Assessed - Upper Body Dressing: Supported sitting Lower Body Dressing: Simulated;Minimal assistance Where Assessed - Lower Body Dressing: Supported sit to Pharmacist, hospital: Mining engineer Method: Sit to Barista: Materials engineer and Hygiene: Simulated;Minimal assistance Where Assessed - Engineer, mining and Hygiene: Standing Equipment Used: Rolling walker Transfers/Ambulation Related  to ADLs: min A ADL Comments: Pt. instructed in safe technique for LB ADLs.  Pt able to cross ankles over knees for LB ADLs    OT Diagnosis: Generalized weakness;Acute pain  OT Problem List: Decreased strength;Decreased activity tolerance;Pain OT Treatment Interventions:     OT Goals Acute Rehab OT Goals OT Goal Formulation: With patient Time For Goal Achievement: 03/10/12 Potential to Achieve Goals: Good ADL Goals Pt Will Perform Grooming: with supervision;Standing at sink ADL Goal: Grooming - Progress: Goal set today Pt Will Perform Lower Body Bathing: with supervision;Sit to stand from chair;Sit to stand from bed ADL Goal: Lower Body Bathing - Progress: Goal set today Pt Will Perform Lower Body Dressing: with supervision;Sit to stand from chair;Sit to stand from bed ADL Goal: Lower Body Dressing - Progress: Goal set today Pt Will Transfer to Toilet: with supervision;Ambulation;3-in-1 ADL Goal: Toilet Transfer - Progress: Goal set today Pt Will Perform Toileting - Clothing Manipulation: with supervision;Standing ADL Goal: Toileting - Clothing Manipulation - Progress: Goal set today Pt Will Perform Toileting - Hygiene: with supervision;Sit to stand from 3-in-1/toilet ADL Goal: Toileting - Hygiene - Progress: Goal set today Pt Will Perform Tub/Shower Transfer: with supervision;Ambulation;Shower seat with back ADL Goal: Tub/Shower Transfer - Progress: Goal set today  Visit Information  Last OT Received On: 03/03/12 Assistance Needed: +1    Subjective Data  Subjective: "I just got back in bed" Patient Stated Goal: To regain independence   Prior Functioning  Home Living Lives With: Spouse Available Help at Discharge: Family;Available 24 hours/day Type of Home: House Home Access: Stairs to enter [employer] of Steps: 4 Entrance Stairs-Rails: Right Home Layout: One level Bathroom Shower/Tub: Engineer, manufacturing systems: Handicapped height Bathroom  Accessibility: Yes How Accessible: Accessible via walker Home Adaptive Equipment: Walker - rolling;Shower chair with back Additional Comments: Pt's spouse has dementia Prior Function Level of Independence: Independent with assistive device(s)  Able to Take Stairs?: Yes Driving: Yes Vocation: Full time employment Comments: works as Scientist, 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 account manager: No difficulties Dominant Hand: Right    Cognition  Overall Cognitive Status: Appears within functional limits for tasks assessed/performed Arousal/Alertness: Awake/alert Orientation Level: Oriented X4 / Intact Behavior During Session: WFL for tasks performed    Extremity/Trunk Assessment Right Upper Extremity Assessment RUE ROM/Strength/Tone: Within functional levels RUE Coordination: WFL - gross/fine motor Left Upper Extremity Assessment LUE ROM/Strength/Tone: Within functional levels LUE Coordination: WFL - gross/fine motor   Mobility Bed Mobility Bed Mobility: Rolling Left;Left Sidelying to Sit;Sitting - Scoot to Edge of Bed Left Sidelying to Sit: 5: Supervision;With rails Sitting - Scoot to Edge of Bed: 5: Supervision Details for Bed Mobility Assistance: Pt. able to log roll, and state proper technique Transfers Transfers: Sit to Stand;Stand to Sit Sit to Stand: 4: Min guard;From bed;From chair/3-in-1 Stand to Sit: 4: Min guard;To chair/3-in-1   Exercise    Balance    End of Session OT - End of Session Equipment Utilized During Treatment: Back brace Activity Tolerance: Patient tolerated treatment well Patient left: in bed;with call bell/phone within reach  GO     [name] M 03/03/2012, 6:20 PM

## 2012-03-03 NOTE — Progress Notes (Signed)
Physical Therapy Evaluation Patient Details Name: [name] MRN: [id number] DOB: [birth year] Today's Date: 03/03/2012 Time: 4132-4401 PT Time Calculation (min): 26 min  PT Assessment / Plan / Recommendation Clinical Impression  Pt is 71 yo female s/p L3-5 laminectomies who is progressing well with ambulation.  She feels that her left leg pain is somewhat improved and the leg feels slightly stronger.  Recommend acute PT to progress mobility and strength and recommend HHPT at d/c.    PT Assessment  Patient needs continued PT services    Follow Up Recommendations  Home health PT;Supervision - Intermittent    Barriers to Discharge None      Equipment Recommendations  None recommended by PT    Recommendations for Other Services OT consult   Frequency Min 5X/week    Precautions / Restrictions Precautions Precautions: Back Precaution Booklet Issued: Yes (comment) Precaution Comments: reviewed precautions Restrictions Weight Bearing Restrictions: No   Pertinent Vitals/Pain 5/10 back pain, premedicated      Mobility  Bed Mobility Bed Mobility: Not assessed (pt sitting EOB) Transfers Transfers: Sit to Stand;Stand to Sit Sit to Stand: 4: Min guard;From bed;From chair/3-in-1 Stand to Sit: 4: Min guard;To chair/3-in-1 Details for Transfer Assistance: vc's for hand placement Ambulation/Gait Ambulation/Gait Assistance: 4: Min assist Ambulation Distance (Feet): 100 Feet Assistive device: Rolling walker Ambulation/Gait Assistance Details: discussed left ankle weakness that she feels is slightly improved since surgery, pt hikes knee to compensate for decr df strength Gait Pattern: Decreased stride length;Left steppage Gait velocity: decreased Stairs: No Wheelchair Mobility Wheelchair Mobility: No    Exercises     PT Diagnosis: Abnormality of gait;Acute pain  PT Problem List: Decreased strength;Decreased activity tolerance;Decreased mobility;Decreased knowledge of use of  DME;Decreased knowledge of precautions;Pain PT Treatment Interventions: DME instruction;Gait training;Stair training;Functional mobility training;Therapeutic activities;Therapeutic exercise;Balance training;Patient/family education   PT Goals Acute Rehab PT Goals PT Goal Formulation: With patient Time For Goal Achievement: 03/10/12 Potential to Achieve Goals: Good Pt will go Supine/Side to Sit: with modified independence PT Goal: Supine/Side to Sit - Progress: Goal set today Pt will go Sit to Supine/Side: with modified independence PT Goal: Sit to Supine/Side - Progress: Goal set today Pt will go Sit to Stand: with modified independence PT Goal: Sit to Stand - Progress: Goal set today Pt will go Stand to Sit: with modified independence PT Goal: Stand to Sit - Progress: Goal set today Pt will Ambulate: >150 feet;with modified independence;with rolling walker PT Goal: Ambulate - Progress: Goal set today Pt will Go Up / Down Stairs: 3-5 stairs;with rail(s);with supervision PT Goal: Up/Down Stairs - Progress: Goal set today Additional Goals Additional Goal #1: pt to verbalize 3/3 back precautions and keep with mobility PT Goal: Additional Goal #1 - Progress: Goal set today  Visit Information  Last PT Received On: 03/03/12 Assistance Needed: +1    Subjective Data  Subjective: I've been moving like they told me Patient Stated Goal: return home   Prior Functioning  Home Living Lives With: Spouse Available Help at Discharge: Family;Available 24 hours/day Type of Home: House Home Access: Stairs to enter [employer] of Steps: 4 Entrance Stairs-Rails: Right Home Layout: One level Bathroom Shower/Tub: Engineer, manufacturing systems: Handicapped height Bathroom Accessibility: Yes Home Adaptive Equipment: Walker - rolling Prior Function Level of Independence: Independent with assistive device(s) Able to Take Stairs?: Yes Driving: Yes Vocation:  Retired Musician: No difficulties    Cognition  Overall Cognitive Status: Appears within functional limits for tasks assessed/performed Arousal/Alertness: Awake/alert Orientation  Level: Oriented X4 / Intact Behavior During Session: [facility] for tasks performed    Extremity/Trunk Assessment Right Lower Extremity Assessment RLE ROM/Strength/Tone: WFL for tasks assessed RLE Sensation: WFL - Light Touch RLE Coordination: WFL - gross motor Left Lower Extremity Assessment LLE ROM/Strength/Tone: Deficits LLE ROM/Strength/Tone Deficits: df 3/5 LLE Sensation: WFL - Light Touch LLE Coordination: WFL - gross motor Trunk Assessment Trunk Assessment: Kyphotic   Balance Balance Balance Assessed: Yes Dynamic Standing Balance Dynamic Standing - Balance Support: Left upper extremity supported;During functional activity Dynamic Standing - Level of Assistance: 5: Stand by assistance  End of Session PT - End of Session Equipment Utilized During Treatment: Gait belt Activity Tolerance: Patient tolerated treatment well Patient left: in chair;with call bell/phone within reach;with family/visitor present Nurse Communication: Mobility status  GP   Lyanne Co, PT  Acute Rehab Services  [phone]   [place], [place] 03/03/2012, 2:02 PM

## 2012-03-03 NOTE — Care Management Note (Unsigned)
    Page 1 of 1   03/03/2012     4:24:28 PM   CARE MANAGEMENT NOTE 03/03/2012  Patient:  [name], [name]   Account Number:  [id number]  Date Initiated:  03/03/2012  Documentation initiated by:  [facility]  Subjective/Objective Assessment:   Admitted postop L3-5 laminectomy, decompression. Lives with spouse, has supportive daughter.     Action/Plan:   PT eval-recommending HHPT  OT eval   Anticipated DC Date:  03/05/2012   Anticipated DC Plan:  HOME W HOME HEALTH SERVICES      DC Planning Services  CM consult      Choice offered to / List presented to:  C-1 Patient           Status of service:  In process, will continue to follow Medicare Important Message given?   (If response is "NO", the following Medicare IM given date fields will be blank) Date Medicare IM given:   Date Additional Medicare IM given:    Discharge Disposition:    Per UR Regulation:  Reviewed for med. necessity/level of care/duration of stay  If discussed at Long Length of Stay Meetings, dates discussed:    Comments:  03/03/12 PT recommending HHPT. Spoke with patient and her daughter Amy about HHC for HHPT and possible HHOT. They chose [facility] from [facility] agencies list. Patient already has walker and customized bathroom. No equipment needs identified.Contacted Hollie Bartus at [facility] and requested HHPT and possibly HHOT, orders pending. Will continue to follow for d/c needs.Jacquelynn Cree RN, BSN, CCM

## 2012-03-03 NOTE — Progress Notes (Signed)
Pt c/o indigestion. NS on-call notified. Order for Maalox received. Will cont to monitor.

## 2012-03-03 NOTE — Progress Notes (Signed)
[name], female   DOB: [birth year], 71 y.o.   MRN: [id number] Decrease of incisional pain. No sensory changes in legs as preop. Drain to be removed. Pt/ot to see.

## 2012-03-04 NOTE — Progress Notes (Signed)
[name], female   DOB: 1940-12-10, 71 y.o.   MRN: [id number] BP 100/59  Pulse 61  Temp 98.6 F (37 C) (Oral)  Resp 18  Ht 5\' 5"  (1.651 m)  Wt 96.6 kg (212 lb 15.4 oz)  BMI 35.44 kg/m2  SpO2 91% Alert and orientedx4 Moving lower extremities well, ~5/5 strength Wound is clean and dry Continue pt

## 2012-03-04 NOTE — Progress Notes (Signed)
Physical Therapy Treatment Patient Details Name: [name] MRN: [id number] DOB: [birth year] Today's Date: 03/04/2012 Time: 2130-8657 PT Time Calculation (min): 33 min  PT Assessment / Plan / Recommendation Comments on Treatment Session  Patient tolerating increased activity performing sponge bath after ambulation in hallway and toileting and washing hands prior to ambulation.  Feel needs extra inpatient day to prepare for d/c as she states spouse with dementia and will have limited physical assist.    Follow Up Recommendations  Home health PT;Supervision - Intermittent    Barriers to Discharge        Equipment Recommendations  None recommended by PT    Recommendations for Other Services    Frequency Min 5X/week   Plan Discharge plan remains appropriate    Precautions / Restrictions Precautions Precautions: Fall;Back   Pertinent Vitals/Pain 7/10 with ambulation, RN made aware    Mobility  Bed Mobility Bed Mobility: Rolling Right;Right Sidelying to Sit;Sitting - Scoot to Edge of Bed;Sit to Sidelying Right Rolling Right: 5: Supervision;With rail Right Sidelying to Sit: 5: Supervision;With rails Sitting - Scoot to Edge of Bed: 6: Modified independent (Device/Increase time) Sit to Sidelying Right: 5: Supervision;With rail Transfers Sit to Stand: 4: Min guard;From bed;With upper extremity assist;From toilet Stand to Sit: 4: Min guard;To bed;To toilet;With upper extremity assist Details for Transfer Assistance: cues for tall back and bend hips/knees Ambulation/Gait Ambulation/Gait Assistance: 5: Supervision Ambulation Distance (Feet): 120 Feet Assistive device: Rolling walker Ambulation/Gait Assistance Details: supervision for safety, stopped to stand straighter couple of times. Gait Pattern: Step-through pattern;Decreased stride length    Exercises      PT Goals Acute Rehab PT Goals Pt will go Supine/Side to Sit: with modified independence PT Goal: Supine/Side to Sit  - Progress: Progressing toward goal Pt will go Sit to Supine/Side: with modified independence PT Goal: Sit to Supine/Side - Progress: Progressing toward goal Pt will go Sit to Stand: with modified independence PT Goal: Sit to Stand - Progress: Progressing toward goal Pt will go Stand to Sit: with modified independence PT Goal: Stand to Sit - Progress: Progressing toward goal Pt will Ambulate: >150 feet;with modified independence;with rolling walker PT Goal: Ambulate - Progress: Progressing toward goal Additional Goals Additional Goal #1: pt to verbalize 3/3 back precautions and keep with mobility PT Goal: Additional Goal #1 - Progress: Progressing toward goal  Visit Information  Last PT Received On: 03/04/12 Assistance Needed: +1    Subjective Data  Subjective: Did a lot yesterday, sore today   Cognition  Overall Cognitive Status: Appears within functional limits for tasks assessed/performed Arousal/Alertness: Awake/alert Orientation Level: Appears intact for tasks assessed Behavior During Session: [facility] for tasks performed    Balance  Dynamic Standing Balance Dynamic Standing - Balance Support: No upper extremity supported;During functional activity;Left upper extremity supported (washing hands, washing periarea) Dynamic Standing - Level of Assistance: 5: Stand by assistance (cues for back precautions to avoid twisting at sink)  End of Session PT - End of Session Equipment Utilized During Treatment: Gait belt Activity Tolerance: Patient tolerated treatment well Patient left: in bed;with call bell/phone within reach   GP     [facility] 03/04/2012, 11:46 AM

## 2012-03-04 NOTE — Progress Notes (Signed)
Occupational Therapy Treatment Patient Details Name: [name] MRN: [id number] DOB: [birth year] Today's Date: 03/04/2012 Time: 7829-5621 OT Time Calculation (min): 33 min  OT Assessment / Plan / Recommendation Comments on Treatment Session Pt. progressing well.  Demonstrates good compliance with back precautions.  Recommend 3-in-1 for home use (Recommend HHOT due to husband unable to assist)    Follow Up Recommendations  Home health OT;Supervision - Intermittent    Barriers to Discharge       Equipment Recommendations  3 in 1 bedside comode    Recommendations for Other Services    Frequency Min 2X/week   Plan Discharge plan needs to be updated    Precautions / Restrictions Precautions Precautions: Fall;Back Precaution Booklet Issued: Yes (comment) Precaution Comments: reviewed precautions (Pt. able to state 3/3 prec) Required Braces or Orthoses: Spinal Brace Spinal Brace: Lumbar corset   Pertinent Vitals/Pain     ADL  Lower Body Dressing: Performed;Supervision/safety Where Assessed - Lower Body Dressing: Supported sit to stand Toilet Transfer: Research scientist (life sciences) Method: Sit to Barista: Comfort height toilet;Grab bars Toileting - Architect and Hygiene: Performed;Supervision/safety Where Assessed - Engineer, mining and Hygiene: Standing Equipment Used: Rolling walker;Back brace Transfers/Ambulation Related to ADLs: supervision ADL Comments: Pt. able to recall proper technique for LB ADLs.  Pt. able to safely perform toilet transfer onto comfort height commode, but pt. reports she thinks it would be easier with 3-in-1.  Pt. is independent with back precautions  (Pt moves slowly and requires increased time to perform tasks)    OT Diagnosis:    OT Problem List:   OT Treatment Interventions:     OT Goals ADL Goals ADL Goal: Lower Body Dressing - Progress: Progressing toward goals ADL Goal:  Toilet Transfer - Progress: Progressing toward goals ADL Goal: Toileting - Clothing Manipulation - Progress: Progressing toward goals ADL Goal: Toileting - Hygiene - Progress: Progressing toward goals  Visit Information  Last OT Received On: 03/04/12 Assistance Needed: +1    Subjective Data      Prior Functioning       Cognition  Overall Cognitive Status: Appears within functional limits for tasks assessed/performed Arousal/Alertness: Awake/alert Orientation Level: Appears intact for tasks assessed Behavior During Session: [facility] for tasks performed    Mobility Bed Mobility Bed Mobility: Rolling Right;Right Sidelying to Sit;Sitting - Scoot to Edge of Bed;Sit to Sidelying Right Rolling Right: 5: Supervision;With rail Right Sidelying to Sit: 5: Supervision;With rails Sitting - Scoot to Edge of Bed: 6: Modified independent (Device/Increase time) Sit to Sidelying Right: 5: Supervision;With rail Details for Bed Mobility Assistance: Pt. able to log roll, and state proper technique Transfers Transfers: Sit to Stand;Stand to Sit Sit to Stand: 5: Supervision;From bed;With upper extremity assist;From toilet Stand to Sit: 5: Supervision;To toilet;To bed   Exercises    Balance    End of Session OT - End of Session Equipment Utilized During Treatment: Back brace Activity Tolerance: Patient tolerated treatment well Patient left: in bed;with call bell/phone within reach  GO     Kyira Volkert M 03/04/2012, 3:54 PM

## 2012-03-05 NOTE — Progress Notes (Signed)
Physical Therapy Treatment Patient Details Name: [name] MRN: [id number] DOB: 18-Oct-1940 Today's Date: 03/05/2012 Time: 5621-3086 PT Time Calculation (min): 25 min  PT Assessment / Plan / Recommendation Comments on Treatment Session  All education completed, gait minimally safe with RW,  bed mobility safe.  Pt could still use HHPT home safety eval and treat.    Follow Up Recommendations  Home health PT;Supervision - Intermittent;Supervision for mobility/OOB    Barriers to Discharge        Equipment Recommendations       Recommendations for Other Services    Frequency Min 5X/week   Plan Discharge plan remains appropriate    Precautions / Restrictions Precautions Precautions: Fall;Back Precaution Comments: reviewed precautions Spinal Brace: Lumbar corset   Pertinent Vitals/Pain     Mobility  Bed Mobility Bed Mobility: Rolling Right;Rolling Left;Right Sidelying to Sit;Sit to Sidelying Right Rolling Right: 5: Supervision Rolling Left: 5: Supervision Right Sidelying to Sit: 5: Supervision Sit to Sidelying Right: 5: Supervision Details for Bed Mobility Assistance: stayed close for safety.  No cuing needed Transfers Transfers: Sit to Stand;Stand to Sit Sit to Stand: 5: Supervision;With upper extremity assist;From chair/3-in-1 Stand to Sit: 5: Supervision;With upper extremity assist;To bed Details for Transfer Assistance: reinforced RW safety.  No assist needed Ambulation/Gait Ambulation/Gait Assistance: 5: Supervision Ambulation Distance (Feet): 175 Feet Assistive device: Rolling walker Ambulation/Gait Assistance Details: generally steady gait, but with short tentative steps Gait Pattern: Step-through pattern;Decreased stride length Gait velocity: decreased Stairs: No    Exercises     PT Diagnosis:    PT Problem List:   PT Treatment Interventions:     PT Goals Acute Rehab PT Goals Potential to Achieve Goals: Good PT Goal: Supine/Side to Sit - Progress:  Progressing toward goal PT Goal: Sit to Supine/Side - Progress: Progressing toward goal PT Goal: Sit to Stand - Progress: Progressing toward goal PT Goal: Stand to Sit - Progress: Progressing toward goal PT Goal: Ambulate - Progress: Progressing toward goal PT Goal: Up/Down Stairs - Progress: Met Additional Goals PT Goal: Additional Goal #1 - Progress: Progressing toward goal  Visit Information  Last PT Received On: 03/05/12 Assistance Needed: +1    Subjective Data  Subjective: Getting in and out of the bed is the worst   Cognition  Overall Cognitive Status: Appears within functional limits for tasks assessed/performed Arousal/Alertness: Awake/alert Orientation Level: Appears intact for tasks assessed Behavior During Session: [facility] for tasks performed    Balance     End of Session PT - End of Session Equipment Utilized During Treatment: Gait belt Activity Tolerance: Patient tolerated treatment well Patient left: in bed;with call bell/phone within reach Nurse Communication: Mobility status   GP     Valentine Barney, Eliseo Gum 03/05/2012, 3:50 PM  03/05/2012  [place] Bing, PT [phone] (pager)

## 2012-03-05 NOTE — Progress Notes (Signed)
Occupational Therapy Treatment Patient Details Name: LUIS SAMI MRN: [id number] DOB: 1941/07/01 Today's Date: 03/05/2012 Time: 0865-7846 OT Time Calculation (min): 35 min  OT Assessment / Plan / Recommendation Comments on Treatment Session Pt. requires supervision with all BADLs except min guard assist with tub transfer.  Pt reluctant about discharge home, but does not want SNF rehab, and states she has family that can assist her.  Dyspnea noted today 2/4 with O2 sats 87-89 after activity.  RN notified and O2 2L applied    Follow Up Recommendations  Home health OT;Supervision - Intermittent    Barriers to Discharge       Equipment Recommendations  3 in 1 bedside comode    Recommendations for Other Services    Frequency Min 2X/week   Plan Discharge plan remains appropriate    Precautions / Restrictions Precautions Precautions: Fall;Back Precaution Booklet Issued: Yes (comment) Precaution Comments: reviewed precautions Required Braces or Orthoses: Spinal Brace Spinal Brace: Lumbar corset Restrictions Weight Bearing Restrictions: No       ADL  Lower Body Bathing: Simulated;Supervision/safety Where Assessed - Lower Body Bathing: Supported sit to stand Toilet Transfer: Buyer, retail Method: Sit to Barista: Comfort height toilet;Grab bars Toileting - Architect and Hygiene: Performed;Supervision/safety Where Assessed - Engineer, mining and Hygiene: Standing Tub/Shower Transfer: Performed;Min guard Web designer Method: Science writer: Information systems manager with back Equipment Used: Rolling walker;Back brace Transfers/Ambulation Related to ADLs: supervision ADL Comments: Pt. initially informs therapist that she is too fatigued to participate - states she has been very busy today, and just back to bed.  Pt. asked if she performed her own bath, and she reports she did all,  but did not do her feet due to not being able to bend forward.  Reminded pt. that she is able to cross ankles over knees, and is able to easily access feet in that manner.  Explicitly went over LB technique with pt. simulating actual LB bath.  Pt. states she is not ready to discharge home.  Encouragement provided - she is doing all BADLs with supervision only. Pt. states she has no support and can't do anything for herself.   Pt. asked if she felt SNF level  rehab would be beneficial?  Pt. states that she does not want to go to SNF, and she can likely manage, and has a large family that can assist her at discharge.  Pt. noted with dyspnea of 2/4 when returning to room from tub transfer training.  O2 sats 87-89%.  2 L O2 applied.  Pt. insructed in and enouraged use of incentive spirometer 10x every hour.  Pt demonstrated understanding.  RN notified     OT Diagnosis:    OT Problem List:   OT Treatment Interventions:     OT Goals ADL Goals ADL Goal: Lower Body Bathing - Progress: Met ADL Goal: Lower Body Dressing - Progress: Met ADL Goal: Toilet Transfer - Progress: Met ADL Goal: Toileting - Clothing Manipulation - Progress: Met ADL Goal: Toileting - Hygiene - Progress: Met ADL Goal: Tub/Shower Transfer - Progress: Progressing toward goals  Visit Information  Last OT Received On: 03/05/12 Assistance Needed: +1    Subjective Data      Prior Functioning       Cognition  Overall Cognitive Status: Appears within functional limits for tasks assessed/performed Arousal/Alertness: Awake/alert Orientation Level: Appears intact for tasks assessed Behavior During Session: [facility] for tasks performed    Mobility Bed Mobility Bed  Mobility: Rolling Right;Right Sidelying to Sit;Sitting - Scoot to [place] of Bed;Sit to Sidelying Right Rolling Right: 5: Supervision Right Sidelying to Sit: 5: Supervision;HOB flat Sitting - Scoot to Edge of Bed: 6: Modified independent (Device/Increase time) Sit to Sidelying  Right: 5: Supervision;HOB flat Details for Bed Mobility Assistance: Rails removed.  Pt. required increased time Transfers Transfers: Sit to Stand;Stand to Sit Sit to Stand: 5: Supervision;From bed;With upper extremity assist;From toilet Stand to Sit: 5: Supervision;To toilet;To bed Details for Transfer Assistance: Pt. attempted to leave walker and ambulated last 2 feet to bed without walker.  Reinforeced walker safety   Exercises    Balance    End of Session OT - End of Session Activity Tolerance: Patient tolerated treatment well Patient left: in bed;with call bell/phone within reach Nurse Communication: Other (comment) (ADL status; O2 sats)  GO     Rhys Lichty M 03/05/2012, 11:05 AM

## 2012-03-05 NOTE — Progress Notes (Signed)
[name], female   DOB: 28-Sep-1940, 71 y.o.   MRN: [id number] BP 145/76  Pulse 70  Temp 98.4 F (36.9 C) (Oral)  Resp 18  Ht 5\' 5"  (1.651 m)  Wt 96.6 kg (212 lb 15.4 oz)  BMI 35.44 kg/m2  SpO2 95% Alert and oriented x 4 Moving lower extremities well Wound clean, no signs of infection.

## 2012-03-06 NOTE — Progress Notes (Signed)
Physical Therapy Treatment Patient Details Name: [name] MRN: [id number] DOB: November 27, 1940 Today's Date: 03/06/2012 Time: 4782-9562 PT Time Calculation (min): 20 min  PT Assessment / Plan / Recommendation Comments on Treatment Session  Education reinforced; pt ready to D/C, rec. HHPT    Follow Up Recommendations  Home health PT;Supervision - Intermittent;Supervision for mobility/OOB    Barriers to Discharge        Equipment Recommendations  3 in 1 bedside comode    Recommendations for Other Services    Frequency Min 5X/week   Plan Discharge plan remains appropriate    Precautions / Restrictions Precautions Precautions: Fall;Back Precaution Booklet Issued: Yes (comment) Precaution Comments: reviewed precautions Required Braces or Orthoses: Spinal Brace Spinal Brace: Lumbar corset Restrictions Weight Bearing Restrictions: No   Pertinent Vitals/Pain     Mobility  Bed Mobility Bed Mobility: Right Sidelying to Sit Rolling Right: 5: Supervision Rolling Left: 5: Supervision Right Sidelying to Sit: 5: Supervision Sitting - Scoot to Edge of Bed: 5: Supervision Sit to Sidelying Right: 5: Supervision Details for Bed Mobility Assistance: reinforced safe technique Transfers Transfers: Sit to Stand;Stand to Sit Sit to Stand: 5: Supervision;With upper extremity assist;From bed Stand to Sit: 5: Supervision;With upper extremity assist;To chair/3-in-1 Details for Transfer Assistance: Min verbal cues for hand placement Ambulation/Gait Ambulation/Gait Assistance: 5: Supervision Ambulation Distance (Feet): 250 Feet Assistive device: Rolling walker Ambulation/Gait Assistance Details: generally steady gait, still tentative, but relaxing more Gait Pattern: Step-through pattern;Decreased stride length Gait velocity: decreased Stairs: No Wheelchair Mobility Wheelchair Mobility: No    Exercises     PT Diagnosis:    PT Problem List:   PT Treatment Interventions:     PT  Goals Acute Rehab PT Goals Time For Goal Achievement: 03/10/12 Potential to Achieve Goals: Good PT Goal: Supine/Side to Sit - Progress: Progressing toward goal PT Goal: Sit to Supine/Side - Progress: Progressing toward goal PT Goal: Sit to Stand - Progress: Progressing toward goal PT Goal: Stand to Sit - Progress: Progressing toward goal PT Goal: Ambulate - Progress: Progressing toward goal Additional Goals Additional Goal #1: pt to verbalize 3/3 back precautions and keep with mobility PT Goal: Additional Goal #1 - Progress: Met  Visit Information  Last PT Received On: 03/06/12 Assistance Needed: +1    Subjective Data      Cognition  Overall Cognitive Status: Appears within functional limits for tasks assessed/performed Arousal/Alertness: Awake/alert Orientation Level: Appears intact for tasks assessed Behavior During Session: [facility] for tasks performed    Balance     End of Session PT - End of Session Activity Tolerance: Patient tolerated treatment well Patient left: in chair;with call bell/phone within reach Nurse Communication: Mobility status   GP     Jalene Demo, Eliseo Gum 03/06/2012, 11:53 AM  03/06/2012  [place] Bing, PT [phone] (pager)

## 2012-03-06 NOTE — Progress Notes (Signed)
Postop day 4. Patient complains of lower back soreness. No radicular pain. Mobility still marginal.  Patient is afebrile. She is mildly hypotensive. Urine output is good. Patient awake and alert oriented and appropriate. Motor and sensory function intact. Wound clean and dry. Chest and abdomen benign.  Progressing slowly following decompressive surgery. Plan to continue efforts at mobilization today and probable discharge home tomorrow.

## 2012-03-06 NOTE — Progress Notes (Signed)
Occupational Therapy Treatment Patient Details Name: [name] MRN: [id number] DOB: May 01, 1941 Today's Date: 03/06/2012 Time: 9629-5284 OT Time Calculation (min): 14 min  OT Assessment / Plan / Recommendation Comments on Treatment Session Pt. able to recall back precautions and verbalize specific techniques.    Follow Up Recommendations  Home health OT;Supervision - Intermittent       Equipment Recommendations  3 in 1 bedside comode       Frequency Min 2X/week   Plan Discharge plan remains appropriate    Precautions / Restrictions Precautions Precautions: Fall;Back Precaution Booklet Issued: Yes (comment) Precaution Comments: reviewed precautions Required Braces or Orthoses: Spinal Brace Spinal Brace: Lumbar corset Restrictions Weight Bearing Restrictions: No   Pertinent Vitals/Pain 2/10 in back    ADL  Toilet Transfer: Performed;Min guard Toilet Transfer Method: Stand pivot Acupuncturist: Bedside commode Equipment Used: Rolling walker;Back brace ADL Comments: Pt. educated on toilet hygiene aid use for completing toileting due to pt. unable to twist/bend while using the bathroom.       OT Goals Acute Rehab OT Goals OT Goal Formulation: With patient Time For Goal Achievement: 03/10/12 Potential to Achieve Goals: Good ADL Goals Pt Will Perform Grooming: with supervision;Standing at sink ADL Goal: Grooming - Progress: Progressing toward goals ADL Goal: Tub/Shower Transfer - Progress: Progressing toward goals  Visit Information  Last OT Received On: 03/06/12 Assistance Needed: +1          Cognition  Overall Cognitive Status: Appears within functional limits for tasks assessed/performed Arousal/Alertness: Awake/alert Orientation Level: Appears intact for tasks assessed Behavior During Session: [facility] for tasks performed    Mobility Bed Mobility Bed Mobility: Rolling Right;Rolling Left;Right Sidelying to Sit;Sit to Sidelying Right Rolling Right:  5: Supervision Right Sidelying to Sit: 5: Supervision Sitting - Scoot to Edge of Bed: 5: Supervision Sit to Sidelying Right: 5: Supervision Details for Bed Mobility Assistance: stayed close for safety.  No cuing needed Transfers Transfers: Sit to Stand;Stand to Sit Sit to Stand: 5: Supervision;With upper extremity assist;From chair/3-in-1 Stand to Sit: 5: Supervision;With upper extremity assist;To bed Details for Transfer Assistance: Min verbal cues for hand placement         End of Session OT - End of Session Equipment Utilized During Treatment: Gait belt Activity Tolerance: Patient tolerated treatment well Patient left: in bed;with call bell/phone within reach Nurse Communication: Mobility status  GO     [name], OTR/L Pager [phone] 03/06/2012, 10:19 AM

## 2012-03-07 MED ORDER — DIAZEPAM 5 MG PO TABS: 5.0000 mg | ORAL_TABLET | Freq: Four times a day (QID) | ORAL | Status: AC | PRN

## 2012-03-07 MED ORDER — OXYCODONE-ACETAMINOPHEN 5-325 MG PO TABS: 1.0000 | ORAL_TABLET | ORAL | Status: AC | PRN

## 2012-03-07 NOTE — Discharge Summary (Signed)
Physician Discharge Summary  [name] MRN: [id number] DOB/AGE: 1941/01/08 71 y.o.  Admit date: 03/02/2012 Discharge date: 03/07/2012  Admission Diagnoses:  Discharge Diagnoses:  Active Problems:  * No active hospital problems. *    Discharged Condition: good  Hospital Course: Patient in the hospital where she underwent uncomplicated lumbar decompression surgery postop she's done well. Back and worsening pain improved. Strength station intact. Rate for discharge home. Consults:   Significant Diagnostic Studies:   Treatments:   Discharge Exam: Blood pressure 128/65, pulse 76, temperature 99.3 F (37.4 C), temperature source Oral, resp. rate 18, height 5\' 5"  (1.651 m), weight 96.6 kg (212 lb 15.4 oz), SpO2 92.00%. Awake alert oriented and appropriate her cranial nerve function is intact. Motor and sensory function intact. Wound clean dry and intact. Chest and abdomen benign.  Disposition:   Discharge Orders    Future Orders Please Complete By Expires   Home Health      Questions: Responses:   To provide the following care/treatments PT    OT   Face-to-face encounter      Comments:   I Trelyn Vanderlinde A certify that this patient is under my care and that I, or a nurse practitioner or physician's assistant working with me, had a face-to-face encounter that meets the physician face-to-face encounter requirements with this patient on 03/07/2012.   Questions: Responses:   The encounter with the patient was in whole, or in part, for the following medical condition, which is the primary reason for home health care Lumbar stenosis.   I certify that, based on my findings, the following services are medically necessary home health services Physical therapy   My clinical findings support the need for the above services High Risk for rehospitalization   Further, I certify that my clinical findings support that this patient is homebound (i.e. absences from home require considerable  and taxing effort and are for medical reasons or religious services or infrequently or of short duration when for other reasons) Pain interferes with ambulation/mobility   To provide the following care/treatments PT    OT     Medication List  As of 03/07/2012 11:48 AM   TAKE these medications         aspirin EC 81 MG tablet   Take 81 mg by mouth daily.      bisoprolol 5 MG tablet   Commonly known as: ZEBETA   Take 5 mg by mouth daily.      diazepam 5 MG tablet   Commonly known as: VALIUM   Take 1 tablet (5 mg total) by mouth every 6 (six) hours as needed.      lisinopril-hydrochlorothiazide 20-12.5 MG per tablet   Commonly known as: PRINZIDE,ZESTORETIC   Take 1 tablet by mouth daily.      oxyCODONE-acetaminophen 5-325 MG per tablet   Commonly known as: PERCOCET   Take 1-2 tablets by mouth every 4 (four) hours as needed.      pravastatin 40 MG tablet   Commonly known as: PRAVACHOL   Take 40 mg by mouth daily.           Follow-up Information    Follow up with Karn Cassis, MD. Call in 1 week.   Contact information:   [address], [address] [place] [address] [phone]          Signed: Temple Pacini 03/07/2012, 11:48 AM

## 2012-03-07 NOTE — Progress Notes (Signed)
   CARE MANAGEMENT NOTE 03/07/2012  Patient:  [name], [name]   Account Number:  [id number]  Date Initiated:  03/03/2012  Documentation initiated by:  [facility]  Subjective/Objective Assessment:   Admitted postop L3-5 laminectomy, decompression. Lives with spouse, has supportive daughter.     Action/Plan:   PT eval-recommending HHPT  OT eval   Anticipated DC Date:  03/05/2012   Anticipated DC Plan:  HOME W HOME HEALTH SERVICES      DC Planning Services  CM consult      [facility] Choice  HOME HEALTH   Choice offered to / List presented to:  C-1 Patient        HH arranged  HH-2 PT  HH-3 OT      [facility] agency  [facility]   Status of service:  Completed, signed off Medicare Important Message given?   (If response is "NO", the following Medicare IM given date fields will be blank) Date Medicare IM given:   Date Additional Medicare IM given:    Discharge Disposition:  HOME W HOME HEALTH SERVICES  Per UR Regulation:  Reviewed for med. necessity/level of care/duration of stay  If discussed at Long Length of Stay Meetings, dates discussed:    Comments:  03/07/2012 1230 Spoke to Unit RN and gave her information to add to pt's d/c instruction. Printed prior to contact info being added. Contacted [facility] to make aware of pt's d/c today with HH. Isidoro Donning RN CCM Case Mgmt phone [phone]  03/03/12 PT recommending HHPT. Spoke with patient and her daughter Amy about HHC for HHPT and possible HHOT. They chose [facility] from [facility] agencies list. Patient already has walker and customized bathroom. No equipment needs identified.Contacted Mary at [facility] and requested HHPT and possibly HHOT, orders pending. Will continue to follow for d/c needs.Jacquelynn Cree RN, BSN, CCM

## 2012-03-07 NOTE — Progress Notes (Signed)
Pt discharged to home with brother as driver. All medications and follow up appointments reviewed. All questions clarified. Elmer Sow, RN

## 2012-03-09 DIAGNOSIS — E119 Type 2 diabetes mellitus without complications: Secondary | ICD-10-CM | POA: Diagnosis not present

## 2012-03-09 DIAGNOSIS — I10 Essential (primary) hypertension: Secondary | ICD-10-CM | POA: Diagnosis not present

## 2012-03-09 DIAGNOSIS — Z5189 Encounter for other specified aftercare: Secondary | ICD-10-CM | POA: Diagnosis not present

## 2012-03-09 DIAGNOSIS — R269 Unspecified abnormalities of gait and mobility: Secondary | ICD-10-CM | POA: Diagnosis not present

## 2012-03-09 DIAGNOSIS — Z4789 Encounter for other orthopedic aftercare: Secondary | ICD-10-CM | POA: Diagnosis not present

## 2012-03-09 DIAGNOSIS — M545 Low back pain: Secondary | ICD-10-CM | POA: Diagnosis not present

## 2012-03-10 DIAGNOSIS — Z4789 Encounter for other orthopedic aftercare: Secondary | ICD-10-CM | POA: Diagnosis not present

## 2012-03-10 DIAGNOSIS — M545 Low back pain: Secondary | ICD-10-CM | POA: Diagnosis not present

## 2012-03-10 DIAGNOSIS — Z5189 Encounter for other specified aftercare: Secondary | ICD-10-CM | POA: Diagnosis not present

## 2012-03-10 DIAGNOSIS — R269 Unspecified abnormalities of gait and mobility: Secondary | ICD-10-CM | POA: Diagnosis not present

## 2012-03-10 DIAGNOSIS — I10 Essential (primary) hypertension: Secondary | ICD-10-CM | POA: Diagnosis not present

## 2012-03-10 DIAGNOSIS — E119 Type 2 diabetes mellitus without complications: Secondary | ICD-10-CM | POA: Diagnosis not present

## 2012-03-11 DIAGNOSIS — M545 Low back pain: Secondary | ICD-10-CM | POA: Diagnosis not present

## 2012-03-11 DIAGNOSIS — E119 Type 2 diabetes mellitus without complications: Secondary | ICD-10-CM | POA: Diagnosis not present

## 2012-03-11 DIAGNOSIS — I10 Essential (primary) hypertension: Secondary | ICD-10-CM | POA: Diagnosis not present

## 2012-03-11 DIAGNOSIS — Z5189 Encounter for other specified aftercare: Secondary | ICD-10-CM | POA: Diagnosis not present

## 2012-03-11 DIAGNOSIS — R269 Unspecified abnormalities of gait and mobility: Secondary | ICD-10-CM | POA: Diagnosis not present

## 2012-03-11 DIAGNOSIS — Z4789 Encounter for other orthopedic aftercare: Secondary | ICD-10-CM | POA: Diagnosis not present

## 2012-03-15 DIAGNOSIS — M545 Low back pain: Secondary | ICD-10-CM | POA: Diagnosis not present

## 2012-03-15 DIAGNOSIS — R269 Unspecified abnormalities of gait and mobility: Secondary | ICD-10-CM | POA: Diagnosis not present

## 2012-03-15 DIAGNOSIS — E119 Type 2 diabetes mellitus without complications: Secondary | ICD-10-CM | POA: Diagnosis not present

## 2012-03-15 DIAGNOSIS — I10 Essential (primary) hypertension: Secondary | ICD-10-CM | POA: Diagnosis not present

## 2012-03-15 DIAGNOSIS — Z4789 Encounter for other orthopedic aftercare: Secondary | ICD-10-CM | POA: Diagnosis not present

## 2012-03-15 DIAGNOSIS — Z5189 Encounter for other specified aftercare: Secondary | ICD-10-CM | POA: Diagnosis not present

## 2012-03-18 DIAGNOSIS — E119 Type 2 diabetes mellitus without complications: Secondary | ICD-10-CM | POA: Diagnosis not present

## 2012-03-18 DIAGNOSIS — R269 Unspecified abnormalities of gait and mobility: Secondary | ICD-10-CM | POA: Diagnosis not present

## 2012-03-18 DIAGNOSIS — I10 Essential (primary) hypertension: Secondary | ICD-10-CM | POA: Diagnosis not present

## 2012-03-18 DIAGNOSIS — M545 Low back pain: Secondary | ICD-10-CM | POA: Diagnosis not present

## 2012-03-18 DIAGNOSIS — Z5189 Encounter for other specified aftercare: Secondary | ICD-10-CM | POA: Diagnosis not present

## 2012-03-18 DIAGNOSIS — Z4789 Encounter for other orthopedic aftercare: Secondary | ICD-10-CM | POA: Diagnosis not present

## 2012-03-22 DIAGNOSIS — R269 Unspecified abnormalities of gait and mobility: Secondary | ICD-10-CM | POA: Diagnosis not present

## 2012-03-22 DIAGNOSIS — I10 Essential (primary) hypertension: Secondary | ICD-10-CM | POA: Diagnosis not present

## 2012-03-22 DIAGNOSIS — E119 Type 2 diabetes mellitus without complications: Secondary | ICD-10-CM | POA: Diagnosis not present

## 2012-03-22 DIAGNOSIS — Z4789 Encounter for other orthopedic aftercare: Secondary | ICD-10-CM | POA: Diagnosis not present

## 2012-03-22 DIAGNOSIS — M545 Low back pain: Secondary | ICD-10-CM | POA: Diagnosis not present

## 2012-03-22 DIAGNOSIS — Z5189 Encounter for other specified aftercare: Secondary | ICD-10-CM | POA: Diagnosis not present

## 2012-03-30 DIAGNOSIS — R269 Unspecified abnormalities of gait and mobility: Secondary | ICD-10-CM | POA: Diagnosis not present

## 2012-03-30 DIAGNOSIS — Z4789 Encounter for other orthopedic aftercare: Secondary | ICD-10-CM | POA: Diagnosis not present

## 2012-03-30 DIAGNOSIS — I10 Essential (primary) hypertension: Secondary | ICD-10-CM | POA: Diagnosis not present

## 2012-03-30 DIAGNOSIS — M545 Low back pain: Secondary | ICD-10-CM | POA: Diagnosis not present

## 2012-03-30 DIAGNOSIS — E119 Type 2 diabetes mellitus without complications: Secondary | ICD-10-CM | POA: Diagnosis not present

## 2012-03-30 DIAGNOSIS — Z5189 Encounter for other specified aftercare: Secondary | ICD-10-CM | POA: Diagnosis not present

## 2012-04-01 DIAGNOSIS — R269 Unspecified abnormalities of gait and mobility: Secondary | ICD-10-CM | POA: Diagnosis not present

## 2012-04-01 DIAGNOSIS — I10 Essential (primary) hypertension: Secondary | ICD-10-CM | POA: Diagnosis not present

## 2012-04-01 DIAGNOSIS — E119 Type 2 diabetes mellitus without complications: Secondary | ICD-10-CM | POA: Diagnosis not present

## 2012-04-01 DIAGNOSIS — Z4789 Encounter for other orthopedic aftercare: Secondary | ICD-10-CM | POA: Diagnosis not present

## 2012-04-01 DIAGNOSIS — Z5189 Encounter for other specified aftercare: Secondary | ICD-10-CM | POA: Diagnosis not present

## 2012-04-01 DIAGNOSIS — M545 Low back pain: Secondary | ICD-10-CM | POA: Diagnosis not present

## 2012-05-14 DIAGNOSIS — Z23 Encounter for immunization: Secondary | ICD-10-CM | POA: Diagnosis not present

## 2012-06-03 DIAGNOSIS — M545 Low back pain: Secondary | ICD-10-CM | POA: Diagnosis not present

## 2012-06-15 DIAGNOSIS — M79609 Pain in unspecified limb: Secondary | ICD-10-CM | POA: Diagnosis not present

## 2012-06-15 DIAGNOSIS — M6281 Muscle weakness (generalized): Secondary | ICD-10-CM | POA: Diagnosis not present

## 2012-06-15 DIAGNOSIS — R209 Unspecified disturbances of skin sensation: Secondary | ICD-10-CM | POA: Diagnosis not present

## 2012-07-01 DIAGNOSIS — M545 Low back pain: Secondary | ICD-10-CM | POA: Diagnosis not present

## 2012-07-08 ENCOUNTER — Other Ambulatory Visit: Payer: Self-pay | Admitting: *Deleted

## 2012-07-08 DIAGNOSIS — M79609 Pain in unspecified limb: Secondary | ICD-10-CM

## 2012-07-08 DIAGNOSIS — I83893 Varicose veins of bilateral lower extremities with other complications: Secondary | ICD-10-CM

## 2012-08-10 ENCOUNTER — Encounter: Payer: Self-pay | Admitting: Vascular Surgery

## 2012-08-11 ENCOUNTER — Encounter: Payer: Self-pay | Admitting: Vascular Surgery

## 2012-08-11 ENCOUNTER — Ambulatory Visit (INDEPENDENT_AMBULATORY_CARE_PROVIDER_SITE_OTHER): Payer: Medicare Other | Admitting: Vascular Surgery

## 2012-08-11 ENCOUNTER — Encounter (INDEPENDENT_AMBULATORY_CARE_PROVIDER_SITE_OTHER): Payer: Medicare Other | Admitting: *Deleted

## 2012-08-11 VITALS — BP 118/53 | HR 60 | Ht 65.0 in | Wt 201.0 lb

## 2012-08-11 DIAGNOSIS — I83893 Varicose veins of bilateral lower extremities with other complications: Secondary | ICD-10-CM | POA: Diagnosis not present

## 2012-08-11 DIAGNOSIS — M79609 Pain in unspecified limb: Secondary | ICD-10-CM

## 2012-08-11 NOTE — Progress Notes (Signed)
[facility]ist [facility]  Patient name: [name] MRN: [id number] DOB: Feb 15, 1941 Sex: female  REASON FOR CONSULT: bilateral leg pain. Referred by Dr. Jeral Fruit.  HPI: [name] is a 71 y.o. female who has a long history of varicose veins of both lower extremities. She's had some aching pains in both legs for proximally 6 months. Her symptoms are more significant on the left side appeared She also describes some occasional burning pain in her feet. She has not had any significant leg swelling except for some swelling in her knees. She has undergone previous back surgery and describes some pain down the posterior aspect of both legs. Her symptoms appear to be aggravated by standing.  I have reviewed her records from Dr. Cassandria Santee office. She has had an EMG which shows some C8 radiculopathy on the right side which is chronic. Because of her leg pain and venous disease she sent for a venous evaluation  Past Medical History  Diagnosis Date  . Hypertension   . Hypercholesterolemia   . Pacemaker   . Dysrhythmia   . Diabetes mellitus     hga1c 7.1 no meds  . Neuropathy of both feet   . Shortness of breath     exertional  . Coronary artery disease     normal coronaries by 09/10/00 cath  . Venous insufficiency (chronic) (peripheral)   . Arthritis     in my hands     Family History  Problem Relation Age of Onset  . Diabetes Mother   . Heart disease Mother   . Hyperlipidemia Mother   . Hypertension Mother   . Diabetes Father   . Heart disease Father     before age 65  . Hyperlipidemia Father   . Hypertension Father   . Heart attack Father     SOCIAL HISTORY: History  Substance Use Topics  . Smoking status: Former Smoker -- 1.0 packs/day for 40 years    Types: Cigarettes    Quit date: 03/02/1992  . Smokeless tobacco: Never Used     Comment: quit smoking 19 years ago  . Alcohol Use: No    No Known Allergies  Current Outpatient Prescriptions   Medication Sig Dispense Refill  . aspirin EC 81 MG tablet Take 81 mg by mouth daily.      . bisoprolol (ZEBETA) 5 MG tablet Take 5 mg by mouth daily.      Marland Kitchen gabapentin (NEURONTIN) 100 MG capsule Take by mouth.      Marland Kitchen HYDROcodone-acetaminophen (NORCO/VICODIN) 5-325 MG per tablet Take 1 tablet by mouth every 6 (six) hours as needed.      . pravastatin (PRAVACHOL) 40 MG tablet Take 40 mg by mouth daily.      Marland Kitchen lisinopril-hydrochlorothiazide (PRINZIDE,ZESTORETIC) 20-12.5 MG per tablet Take 1 tablet by mouth daily.        REVIEW OF SYSTEMS: Arly.Keller ] denotes positive finding; [  ] denotes negative finding  CARDIOVASCULAR:  [ ]  chest pain   [ ]  chest pressure   [ ]  palpitations   [ ]  orthopnea   [ ]  dyspnea on exertion   [ ]  claudication   [ ]  rest pain   [ ]  DVT   [ ]  phlebitis PULMONARY:   [ ]  productive cough   [ ]  asthma   [ ]  wheezing NEUROLOGIC:   [ ]  weakness  Arly.Keller ] paresthesias  [ ]  aphasia  [ ]  amaurosis  [ ]  dizziness HEMATOLOGIC:   [ ]   bleeding problems   [ ]  clotting disorders MUSCULOSKELETAL:  [ ]  joint pain   [ ]  joint swelling Arly.Keller ] leg swelling GASTROINTESTINAL: [ ]   blood in stool  [ ]   hematemesis GENITOURINARY:  [ ]   dysuria  [ ]   hematuria PSYCHIATRIC:  [ ]  history of major depression INTEGUMENTARY:  [ ]  rashes  [ ]  ulcers CONSTITUTIONAL:  [ ]  fever   [ ]  chills  PHYSICAL EXAM: Filed Vitals:   08/11/12 1538  BP: 118/53  Pulse: 60  Height: 5\' 5"  (1.651 m)  Weight: 201 lb (91.173 kg)  SpO2: 97%   Body mass index is 33.45 kg/(m^2). GENERAL: The patient is a well-nourished female, in no acute distress. The vital signs are documented above. CARDIOVASCULAR: There is a regular rate and rhythm. I do not detect carotid bruits. She has palpable posterior tibial pulses bilaterally. She has a right dorsalis pedis pulse. I cannot palpate a left dorsalis pedis pulse. PULMONARY: There is good air exchange bilaterally without wheezing or rales. ABDOMEN: Soft and non-tender with normal  pitched bowel sounds.  MUSCULOSKELETAL: There are no major deformities or cyanosis. NEUROLOGIC: No focal weakness or paresthesias are detected. SKIN: There are no ulcers or rashes noted. PSYCHIATRIC: The patient has a normal affect.  DATA:  I have independently interpreted her venous duplex scan today which shows some reflux in the deep system on the left and also in the greater saphenous vein in the left side. In addition she has some deep venous reflux on the right and also reflux and a segment of the saphenous vein in the right thigh. There is no evidence of DVT in either lower extremity.  MEDICAL ISSUES:  Varicose veins of lower extremities with other complications This patient has bilateral lower extremity varicose veins. Although she does experience some aching pain in her legs she does describe some pain down the posterior aspect of her legs but I do not think can be attributed to her venous disease. We have discussed the importance of intermittent leg elevation. I've also written her a prescription for a thigh high compression stocking with a gradient of 20-30 mm of mercury. She does have reflux in both greater saphenous veins but is not an ideal candidate for laser ablation of her veins given her obesity. However, if her symptoms progress and she could be considered for laser ablation of the saphenous veins. We will see her back as needed.   Benedetta Sundstrom S [facility] [place] [facility]: [phone]

## 2012-08-11 NOTE — Assessment & Plan Note (Signed)
This patient has bilateral lower extremity varicose veins. Although she does experience some aching pain in her legs she does describe some pain down the posterior aspect of her legs but I do not think can be attributed to her venous disease. We have discussed the importance of intermittent leg elevation. I've also written her a prescription for a thigh high compression stocking with a gradient of 20-30 mm of mercury. She does have reflux in both greater saphenous veins but is not an ideal candidate for laser ablation of her veins given her obesity. However, if her symptoms progress and she could be considered for laser ablation of the saphenous veins. We will see her back as needed.

## 2012-09-23 DIAGNOSIS — M48061 Spinal stenosis, lumbar region without neurogenic claudication: Secondary | ICD-10-CM | POA: Diagnosis not present

## 2012-09-23 DIAGNOSIS — I10 Essential (primary) hypertension: Secondary | ICD-10-CM | POA: Diagnosis not present

## 2012-09-23 DIAGNOSIS — E119 Type 2 diabetes mellitus without complications: Secondary | ICD-10-CM | POA: Diagnosis not present

## 2012-09-23 DIAGNOSIS — E7800 Pure hypercholesterolemia, unspecified: Secondary | ICD-10-CM | POA: Diagnosis not present

## 2012-10-05 DIAGNOSIS — M48061 Spinal stenosis, lumbar region without neurogenic claudication: Secondary | ICD-10-CM | POA: Diagnosis not present

## 2012-10-08 ENCOUNTER — Other Ambulatory Visit: Payer: Self-pay | Admitting: Orthopedic Surgery

## 2012-10-08 DIAGNOSIS — M4800 Spinal stenosis, site unspecified: Secondary | ICD-10-CM

## 2012-10-21 ENCOUNTER — Ambulatory Visit
Admission: RE | Admit: 2012-10-21 | Discharge: 2012-10-21 | Disposition: A | Discharge status: Home / Self Care | Payer: Medicare Other | Source: Ambulatory Visit | Attending: Orthopedic Surgery | Admitting: Orthopedic Surgery

## 2012-10-21 VITALS — BP 133/57 | HR 60

## 2012-10-21 DIAGNOSIS — M4800 Spinal stenosis, site unspecified: Secondary | ICD-10-CM

## 2012-10-21 DIAGNOSIS — M48061 Spinal stenosis, lumbar region without neurogenic claudication: Secondary | ICD-10-CM | POA: Diagnosis not present

## 2012-10-21 DIAGNOSIS — M4310 Spondylolisthesis, site unspecified: Secondary | ICD-10-CM | POA: Diagnosis not present

## 2012-10-21 DIAGNOSIS — M5126 Other intervertebral disc displacement, lumbar region: Secondary | ICD-10-CM | POA: Diagnosis not present

## 2012-10-21 MED ORDER — IOHEXOL 180 MG/ML  SOLN
15.0000 mL | Freq: Once | INTRAMUSCULAR | Status: AC | PRN
  Administered 2012-10-21: 15 mL via INTRATHECAL

## 2012-10-21 MED ORDER — ONDANSETRON HCL 4 MG/2ML IJ SOLN: 4.0000 mg | Freq: Four times a day (QID) | INTRAMUSCULAR | Status: DC | PRN

## 2012-10-21 MED ORDER — DIAZEPAM 5 MG PO TABS
5.0000 mg | ORAL_TABLET | Freq: Once | ORAL | Status: AC
  Administered 2012-10-21: 5 mg via ORAL

## 2012-10-29 DIAGNOSIS — M545 Low back pain: Secondary | ICD-10-CM | POA: Diagnosis not present

## 2013-01-13 ENCOUNTER — Other Ambulatory Visit: Payer: Self-pay | Admitting: *Deleted

## 2013-01-13 MED ORDER — BISOPROLOL FUMARATE 5 MG PO TABS: 5.0000 mg | ORAL_TABLET | Freq: Every day | ORAL | Status: DC

## 2013-01-13 NOTE — Telephone Encounter (Signed)
REFILLED BISOPROLOL.

## 2013-02-11 ENCOUNTER — Ambulatory Visit (INDEPENDENT_AMBULATORY_CARE_PROVIDER_SITE_OTHER): Payer: Medicare Other | Admitting: *Deleted

## 2013-02-11 VITALS — HR 64

## 2013-02-11 DIAGNOSIS — I495 Sick sinus syndrome: Secondary | ICD-10-CM | POA: Diagnosis not present

## 2013-02-11 DIAGNOSIS — I442 Atrioventricular block, complete: Secondary | ICD-10-CM | POA: Diagnosis not present

## 2013-02-11 LAB — PACEMAKER DEVICE OBSERVATION
AL IMPEDENCE PM: 402 Ohm
AL THRESHOLD: 1 V
RV LEAD THRESHOLD: 0.625 V

## 2013-02-11 NOTE — Patient Instructions (Signed)
Follow up in September with the device clinic and in 6 months with Dr.[name].

## 2013-02-11 NOTE — Progress Notes (Signed)
In clinic device interrogation. Normal device function. No changes made this session.  

## 2013-02-22 DIAGNOSIS — M1710 Unilateral primary osteoarthritis, unspecified knee: Secondary | ICD-10-CM | POA: Diagnosis not present

## 2013-03-09 ENCOUNTER — Encounter: Payer: Self-pay | Admitting: Cardiovascular Disease

## 2013-05-25 DIAGNOSIS — Z23 Encounter for immunization: Secondary | ICD-10-CM | POA: Diagnosis not present

## 2013-06-07 DIAGNOSIS — M1710 Unilateral primary osteoarthritis, unspecified knee: Secondary | ICD-10-CM | POA: Diagnosis not present

## 2013-09-15 ENCOUNTER — Ambulatory Visit (INDEPENDENT_AMBULATORY_CARE_PROVIDER_SITE_OTHER): Payer: Medicare Other | Admitting: Cardiovascular Disease

## 2013-09-15 ENCOUNTER — Encounter: Payer: Self-pay | Admitting: Cardiovascular Disease

## 2013-09-15 VITALS — BP 100/60 | Resp 20 | Ht 66.0 in | Wt 206.1 lb

## 2013-09-15 DIAGNOSIS — I442 Atrioventricular block, complete: Secondary | ICD-10-CM

## 2013-09-15 DIAGNOSIS — Z950 Presence of cardiac pacemaker: Secondary | ICD-10-CM | POA: Diagnosis not present

## 2013-09-15 DIAGNOSIS — I495 Sick sinus syndrome: Secondary | ICD-10-CM | POA: Diagnosis not present

## 2013-09-15 DIAGNOSIS — IMO0002 Reserved for concepts with insufficient information to code with codable children: Secondary | ICD-10-CM | POA: Diagnosis not present

## 2013-09-15 DIAGNOSIS — M1710 Unilateral primary osteoarthritis, unspecified knee: Secondary | ICD-10-CM | POA: Diagnosis not present

## 2013-09-15 DIAGNOSIS — Z0389 Encounter for observation for other suspected diseases and conditions ruled out: Secondary | ICD-10-CM

## 2013-09-15 DIAGNOSIS — E785 Hyperlipidemia, unspecified: Secondary | ICD-10-CM

## 2013-09-15 DIAGNOSIS — I10 Essential (primary) hypertension: Secondary | ICD-10-CM | POA: Diagnosis not present

## 2013-09-15 DIAGNOSIS — IMO0001 Reserved for inherently not codable concepts without codable children: Secondary | ICD-10-CM

## 2013-09-15 LAB — PACEMAKER DEVICE OBSERVATION

## 2013-09-15 NOTE — Patient Instructions (Signed)
Your physician recommends that you schedule a follow-up appointment in: 6 months with Dr.[name] + pacemaker check   

## 2013-09-16 ENCOUNTER — Encounter: Payer: Self-pay | Admitting: Cardiovascular Disease

## 2013-09-16 LAB — MDC_IDC_ENUM_SESS_TYPE_INCLINIC
Battery Impedance: 1100 Ohm
Battery Voltage: 2.78 V
Brady Statistic RA Percent Paced: 38 %
Brady Statistic RV Percent Paced: 99 %
Date Time Interrogation Session: 20150115165447
Implantable Pulse Generator Model: 5826
Implantable Pulse Generator Serial Number: 1904939
Lead Channel Impedance Value: 465 Ohm
Lead Channel Impedance Value: 544 Ohm
Lead Channel Pacing Threshold Amplitude: 0.625 V
Lead Channel Pacing Threshold Amplitude: 0.75 V
Lead Channel Pacing Threshold Pulse Width: 0.5 ms
Lead Channel Pacing Threshold Pulse Width: 0.5 ms
Lead Channel Sensing Intrinsic Amplitude: 2.4 mV
Lead Channel Setting Pacing Amplitude: 2 V
Lead Channel Setting Pacing Pulse Width: 0.5 ms
Lead Channel Setting Sensing Sensitivity: 4 mV

## 2013-09-19 ENCOUNTER — Encounter: Payer: Self-pay | Admitting: Cardiovascular Disease

## 2013-09-19 DIAGNOSIS — I442 Atrioventricular block, complete: Secondary | ICD-10-CM | POA: Insufficient documentation

## 2013-09-19 DIAGNOSIS — Z950 Presence of cardiac pacemaker: Secondary | ICD-10-CM | POA: Insufficient documentation

## 2013-09-19 DIAGNOSIS — I10 Essential (primary) hypertension: Secondary | ICD-10-CM

## 2013-09-19 DIAGNOSIS — E1169 Type 2 diabetes mellitus with other specified complication: Secondary | ICD-10-CM | POA: Insufficient documentation

## 2013-09-19 DIAGNOSIS — IMO0001 Reserved for inherently not codable concepts without codable children: Secondary | ICD-10-CM | POA: Insufficient documentation

## 2013-09-19 DIAGNOSIS — E785 Hyperlipidemia, unspecified: Secondary | ICD-10-CM

## 2013-09-19 DIAGNOSIS — I152 Hypertension secondary to endocrine disorders: Secondary | ICD-10-CM | POA: Insufficient documentation

## 2013-09-19 DIAGNOSIS — Z0389 Encounter for observation for other suspected diseases and conditions ruled out: Secondary | ICD-10-CM

## 2013-09-19 HISTORY — DX: Essential (primary) hypertension: I10

## 2013-09-19 HISTORY — DX: Hyperlipidemia, unspecified: E78.5

## 2013-09-19 HISTORY — DX: Atrioventricular block, complete: I44.2

## 2013-09-19 NOTE — Assessment & Plan Note (Signed)
Her blood pressure is borderline low today. No changes are made her medications

## 2013-09-19 NOTE — Assessment & Plan Note (Signed)
[address] [place], serial number [id number], dual-chamber pacemaker , 2009 . Roughly 30% atrial pacing and 100% ventricular pacing. No meaningful episodes of atrial or ventricular tachyarrhythmia. Continue in office followup every 6 months. Reminded about the importance of avoiding situations with potential electromagnetic interference.

## 2013-09-19 NOTE — Progress Notes (Signed)
[name], female   DOB: Feb 10, 1941, 73 y.o.   MRN: [id number]      Reason for office visit Pacemaker follow up/complete heart block, HTN, Hyperlipidemia  Mrs. Chopp has done well since her last appointment without any problems of syncope, chest pain, shortness of breath, dizziness or fatigue. She is pacemaker dependent with complete heart block since 2009. Comprehensive check of her pacemaker in the office today shows normal function and no evidence of significant arrhythmia. Her device is a Radio broadcast assistant that is not capable of remote monitoring.  She is well treated for hypertension and hyperlipidemia. She does not have problems with coronary disease or valvular abnormalities and has normal left ventricular systolic function. She sees Dr. Scot Dock for problems with varicose veins of the lower extremities.   No Known Allergies  Current Outpatient Prescriptions  Medication Sig Dispense Refill  . aspirin EC 81 MG tablet Take 81 mg by mouth daily.      . bisoprolol (ZEBETA) 5 MG tablet Take 1 tablet (5 mg total) by mouth daily.  30 tablet  6  . gabapentin (NEURONTIN) 100 MG capsule Take 100 mg by mouth at bedtime.       Marland Kitchen HYDROcodone-acetaminophen (NORCO/VICODIN) 5-325 MG per tablet Take 1 tablet by mouth every 6 (six) hours as needed.      . Multiple Vitamins-Minerals (MULTIVITAMIN PO) Take by mouth daily.      . pravastatin (PRAVACHOL) 40 MG tablet Take 40 mg by mouth daily.       No current facility-administered medications for this visit.    Past Medical History  Diagnosis Date  . Hypertension   . Hypercholesterolemia   . Pacemaker   . Dysrhythmia   . Diabetes mellitus     hga1c 7.1 no meds  . Neuropathy of both feet   . Shortness of breath     exertional  . Coronary artery disease     normal coronaries by 09/10/00 cath  . Venous insufficiency (chronic) (peripheral)   . Arthritis     in my hands   . CHB (complete heart block) 09/19/2013  . HTN  (hypertension) 09/19/2013  . Normal coronary arteries angiography 2002 09/19/2013    Past Surgical History  Procedure Laterality Date  . Cardiac pacemaker placement  12/2007    [employer] Jude   . Anterior cervical decomp/discectomy fusion  2007  . Cholecystectomy  1995  . Lumbar laminectomy  03/02/2012  . Lumbar laminectomy/decompression microdiscectomy  03/02/2012    Procedure: LUMBAR LAMINECTOMY/DECOMPRESSION MICRODISCECTOMY 2 LEVELS;  Surgeon: Floyce Stakes, MD;  Location: [place] [facility];  Service: Neurosurgery;  Laterality: Bilateral;  Lumbar three, lumbar four, lumbar five Laminectomy/Foraminotomy    Family History  Problem Relation Age of Onset  . Diabetes Mother   . Heart disease Mother   . Hyperlipidemia Mother   . Hypertension Mother   . Diabetes Father   . Heart disease Father     before age 67  . Hyperlipidemia Father   . Hypertension Father   . Heart attack Father     History   Social History  . Marital Status: Married    Spouse Name: N/A    Number of Children: N/A  . Years of Education: N/A   Occupational History  . Not on file.   Social History Main Topics  . Smoking status: Former Smoker -- 1.00 packs/day for 40 years    Types: Cigarettes    Quit date: 03/02/1992  . Smokeless tobacco:  Never Used     Comment: quit smoking 19 years ago  . Alcohol Use: No  . Drug Use: No  . Sexual Activity: Not on file   Other Topics Concern  . Not on file   Social History Narrative  . No narrative on file    Review of systems: The patient specifically denies any chest pain at rest or with exertion, dyspnea at rest or with exertion, orthopnea, paroxysmal nocturnal dyspnea, syncope, palpitations, focal neurological deficits, intermittent claudication, lower extremity edema, unexplained weight gain, cough, hemoptysis or wheezing.  The patient also denies abdominal pain, nausea, vomiting, dysphagia, diarrhea, constipation, polyuria, polydipsia, dysuria, hematuria, frequency,  urgency, abnormal bleeding or bruising, fever, chills, unexpected weight changes, mood swings, change in skin or hair texture, change in voice quality, auditory or visual problems, allergic reactions or rashes, new musculoskeletal complaints other than usual "aches and pains".   PHYSICAL EXAM BP 100/60  Resp 20  Ht 5\' 6"  (1.676 m)  Wt 93.486 kg (206 lb 1.6 oz)  BMI 33.28 kg/m2  General: Alert, oriented x3, no distress Head: no evidence of trauma, PERRL, EOMI, no exophtalmos or lid lag, no myxedema, no xanthelasma; normal ears, nose and oropharynx Neck: normal jugular venous pulsations and no hepatojugular reflux; brisk carotid pulses without delay and no carotid bruits Chest: clear to auscultation, no signs of consolidation by percussion or palpation, normal fremitus, symmetrical and full respiratory excursions Cardiovascular: normal position and quality of the apical impulse, regular rhythm, normal first and second heart sounds, no murmurs, rubs or gallops Abdomen: no tenderness or distention, no masses by palpation, no abnormal pulsatility or arterial bruits, normal bowel sounds, no hepatosplenomegaly Extremities: Prominent bilateral venous varicosities, right greater than left; no clubbing, cyanosis or edema; 2+ radial, ulnar and brachial pulses bilaterally; 2+ right femoral, posterior tibial and dorsalis pedis pulses; 2+ left femoral, posterior tibial and dorsalis pedis pulses; no subclavian or femoral bruits Neurological: grossly nonfocal   EKG: Atrial sensed ventricular paced  Lipid Panel     Component Value Date/Time   CHOL  Value: 158        ATP III CLASSIFICATION:  <200     mg/dL   Desirable  200-239  mg/dL   Borderline High  >=240    mg/dL   High 11/30/2007 0510   TRIG 174* 11/30/2007 0510   HDL 43 11/30/2007 0510   CHOLHDL 3.7 11/30/2007 0510   VLDL 35 11/30/2007 0510   LDLCALC  Value: 80        Total Cholesterol/HDL:CHD Risk Coronary Heart Disease Risk Table                      Men   Women  1/2 Average Risk   3.4   3.3 11/30/2007 0510    BMET    Component Value Date/Time   NA 144 02/27/2012 1331   K 4.0 02/27/2012 1331   CL 103 02/27/2012 1331   CO2 31 02/27/2012 1331   GLUCOSE 134* 02/27/2012 1331   BUN 19 02/27/2012 1331   CREATININE 0.60 02/27/2012 1331   CALCIUM 9.4 02/27/2012 1331   GFRNONAA 89* 02/27/2012 1331   GFRAA >90 02/27/2012 1331     ASSESSMENT AND PLAN CHB (complete heart block)    Pacemaker - dual chamber [address] [employer], [address] [place], serial number [id number], dual-chamber pacemaker , 2009 . Roughly 30% atrial pacing and 100% ventricular pacing. No meaningful episodes of atrial or ventricular tachyarrhythmia. Continue in office followup  every 6 months. Reminded about the importance of avoiding situations with potential electromagnetic interference.  HTN (hypertension) Her blood pressure is borderline low today. No changes are made her medications   will get records of her lipid profile from her primary care physician Patient Instructions  Your physician recommends that you schedule a follow-up appointment in: 6 months with Dr.Nefertari Rebman + pacemaker check     Orders Placed This Encounter  Procedures  . Implantable device check  . EKG 12-Lead   No orders of the defined types were placed in this encounter.    Holli Humbles, MD, [place] [phone] office [phone] pager

## 2013-09-19 NOTE — Assessment & Plan Note (Signed)
>>  ASSESSMENT AND PLAN FOR ESSENTIAL HYPERTENSION WRITTEN ON 09/19/2013  9:24 PM BY [name], MIHAI, MD  Her blood pressure is borderline low today. No changes are made her medications

## 2013-09-29 DIAGNOSIS — E119 Type 2 diabetes mellitus without complications: Secondary | ICD-10-CM | POA: Diagnosis not present

## 2013-09-29 DIAGNOSIS — E7800 Pure hypercholesterolemia, unspecified: Secondary | ICD-10-CM | POA: Diagnosis not present

## 2013-09-29 DIAGNOSIS — I10 Essential (primary) hypertension: Secondary | ICD-10-CM | POA: Diagnosis not present

## 2013-09-29 DIAGNOSIS — Z1239 Encounter for other screening for malignant neoplasm of breast: Secondary | ICD-10-CM | POA: Diagnosis not present

## 2013-09-29 DIAGNOSIS — Z1331 Encounter for screening for depression: Secondary | ICD-10-CM | POA: Diagnosis not present

## 2013-09-29 DIAGNOSIS — I442 Atrioventricular block, complete: Secondary | ICD-10-CM | POA: Diagnosis not present

## 2013-09-29 DIAGNOSIS — Z950 Presence of cardiac pacemaker: Secondary | ICD-10-CM | POA: Diagnosis not present

## 2013-10-05 DIAGNOSIS — L821 Other seborrheic keratosis: Secondary | ICD-10-CM | POA: Diagnosis not present

## 2013-10-05 DIAGNOSIS — D235 Other benign neoplasm of skin of trunk: Secondary | ICD-10-CM | POA: Diagnosis not present

## 2013-10-17 ENCOUNTER — Encounter: Payer: Medicare Other | Admitting: *Deleted

## 2013-10-20 ENCOUNTER — Other Ambulatory Visit: Payer: Self-pay | Admitting: Family Medicine

## 2013-10-20 DIAGNOSIS — Z1231 Encounter for screening mammogram for malignant neoplasm of breast: Secondary | ICD-10-CM

## 2013-10-24 ENCOUNTER — Encounter: Payer: Self-pay | Admitting: *Deleted

## 2013-11-02 ENCOUNTER — Ambulatory Visit
Admission: RE | Admit: 2013-11-02 | Discharge: 2013-11-02 | Disposition: A | Discharge status: Home / Self Care | Payer: Medicare Other | Source: Ambulatory Visit | Attending: Family Medicine | Admitting: Family Medicine

## 2013-11-02 DIAGNOSIS — Z1231 Encounter for screening mammogram for malignant neoplasm of breast: Secondary | ICD-10-CM

## 2013-11-03 ENCOUNTER — Encounter: Payer: Self-pay | Admitting: *Deleted

## 2014-03-01 DIAGNOSIS — L820 Inflamed seborrheic keratosis: Secondary | ICD-10-CM | POA: Diagnosis not present

## 2014-04-05 DIAGNOSIS — M1710 Unilateral primary osteoarthritis, unspecified knee: Secondary | ICD-10-CM | POA: Diagnosis not present

## 2014-04-12 DIAGNOSIS — M1710 Unilateral primary osteoarthritis, unspecified knee: Secondary | ICD-10-CM | POA: Diagnosis not present

## 2014-04-20 DIAGNOSIS — M1710 Unilateral primary osteoarthritis, unspecified knee: Secondary | ICD-10-CM | POA: Diagnosis not present

## 2014-04-27 DIAGNOSIS — M1710 Unilateral primary osteoarthritis, unspecified knee: Secondary | ICD-10-CM | POA: Diagnosis not present

## 2014-05-02 ENCOUNTER — Encounter: Payer: Self-pay | Admitting: Cardiovascular Disease

## 2014-05-02 ENCOUNTER — Ambulatory Visit (INDEPENDENT_AMBULATORY_CARE_PROVIDER_SITE_OTHER): Payer: Medicare Other | Admitting: Cardiovascular Disease

## 2014-05-02 VITALS — BP 162/86 | HR 70 | Resp 16 | Ht 66.0 in | Wt 209.3 lb

## 2014-05-02 DIAGNOSIS — I10 Essential (primary) hypertension: Secondary | ICD-10-CM

## 2014-05-02 DIAGNOSIS — I442 Atrioventricular block, complete: Secondary | ICD-10-CM | POA: Diagnosis not present

## 2014-05-02 DIAGNOSIS — Z950 Presence of cardiac pacemaker: Secondary | ICD-10-CM | POA: Diagnosis not present

## 2014-05-02 DIAGNOSIS — E785 Hyperlipidemia, unspecified: Secondary | ICD-10-CM

## 2014-05-02 LAB — MDC_IDC_ENUM_SESS_TYPE_INCLINIC
Battery Voltage: 2.79 V
Implantable Pulse Generator Model: 5826
Implantable Pulse Generator Serial Number: 1904939
Lead Channel Pacing Threshold Amplitude: 0.75 V
Lead Channel Pacing Threshold Amplitude: 0.75 V
Lead Channel Pacing Threshold Pulse Width: 0.4 ms
Lead Channel Setting Pacing Amplitude: 2 V
Lead Channel Setting Pacing Pulse Width: 0.5 ms
Lead Channel Setting Sensing Sensitivity: 4 mV
MDC IDC MSMT BATTERY IMPEDANCE: 1500 Ohm
MDC IDC MSMT LEADCHNL RA IMPEDANCE VALUE: 433 Ohm
MDC IDC MSMT LEADCHNL RA PACING THRESHOLD PULSEWIDTH: 0.5 ms
MDC IDC MSMT LEADCHNL RA SENSING INTR AMPL: 2.8 mV
MDC IDC MSMT LEADCHNL RV IMPEDANCE VALUE: 516 Ohm
MDC IDC SESS DTM: 20150901090726

## 2014-05-02 NOTE — Progress Notes (Signed)
[name], female   DOB: [birth year], 73 y.o.   MRN: [id number]     Reason for office visit Pacemaker follow up/complete heart block, HTN, Hyperlipidemia   [name] has done well since her last appointment without any problems of syncope, chest pain, shortness of breath, dizziness or fatigue. Her life revolves around taking care of her husband, who has worsening dementia.  She is pacemaker dependent with complete heart block since 2009. Comprehensive check of her pacemaker in the office today shows normal function and no evidence of significant arrhythmia. Her device is a Radio broadcast assistant that is not capable of remote monitoring. Battery longevity is estimated at 4-5 years. Roughly 70% atrial pacing and 100% ventricular pacing. No meaningful tachyarrhythmia has been recorded  She is well treated for hypertension and hyperlipidemia. She had normal coronary arteries by angiography a long time ago in 2002. She does not have problems with  valvular abnormalities and has normal left ventricular systolic function. She sees Dr. Scot Dock for problems with varicose veins of the lower extremities.    No Known Allergies  Current Outpatient Prescriptions  Medication Sig Dispense Refill  . aspirin EC 81 MG tablet Take 81 mg by mouth daily.      Marland Kitchen atenolol (TENORMIN) 25 MG tablet Take 25 mg by mouth daily.      . Multiple Vitamins-Minerals (MULTIVITAMIN PO) Take by mouth daily.      . pravastatin (PRAVACHOL) 40 MG tablet Take 40 mg by mouth daily.       No current facility-administered medications for this visit.    Past Medical History  Diagnosis Date  . Hypertension   . Hypercholesterolemia   . Pacemaker   . Dysrhythmia   . Diabetes mellitus     hga1c 7.1 no meds  . Neuropathy of both feet   . Shortness of breath     exertional  . Coronary artery disease     normal coronaries by 09/10/00 cath  . Venous insufficiency (chronic) (peripheral)   . Arthritis     in my hands     . CHB (complete heart block) 09/19/2013  . HTN (hypertension) 09/19/2013  . Normal coronary arteries angiography 2002 09/19/2013  . Hyperlipidemia 09/19/2013    Past Surgical History  Procedure Laterality Date  . Cardiac pacemaker placement  12/2007    St Jude   . Anterior cervical decomp/discectomy fusion  2007  . Cholecystectomy  1995  . Lumbar laminectomy  03/02/2012  . Lumbar laminectomy/decompression microdiscectomy  03/02/2012    Procedure: LUMBAR LAMINECTOMY/DECOMPRESSION MICRODISCECTOMY 2 LEVELS;  Surgeon: Floyce Stakes, MD;  Location: [facility];  Service: Neurosurgery;  Laterality: Bilateral;  Lumbar three, lumbar four, lumbar five Laminectomy/Foraminotomy    Family History  Problem Relation Age of Onset  . Diabetes Mother   . Heart disease Mother   . Hyperlipidemia Mother   . Hypertension Mother   . Diabetes Father   . Heart disease Father     before age 67  . Hyperlipidemia Father   . Hypertension Father   . Heart attack Father     History   Social History  . Marital Status: Married    Spouse Name: N/A    Number of Children: N/A  . Years of Education: N/A   Occupational History  . Not on file.   Social History Main Topics  . Smoking status: Former Smoker -- 1.00 packs/day for 40 years    Types: Cigarettes    Quit  date: 03/02/1992  . Smokeless tobacco: Never Used     Comment: quit smoking 19 years ago  . Alcohol Use: No  . Drug Use: No  . Sexual Activity: Not on file   Other Topics Concern  . Not on file   Social History Narrative  . No narrative on file    Review of systems: The patient specifically denies any chest pain at rest or with exertion, dyspnea at rest or with exertion, orthopnea, paroxysmal nocturnal dyspnea, syncope, palpitations, focal neurological deficits, intermittent claudication, lower extremity edema, unexplained weight gain, cough, hemoptysis or wheezing.  The patient also denies abdominal pain, nausea, vomiting, dysphagia,  diarrhea, constipation, polyuria, polydipsia, dysuria, hematuria, frequency, urgency, abnormal bleeding or bruising, fever, chills, unexpected weight changes, mood swings, change in skin or hair texture, change in voice quality, auditory or visual problems, allergic reactions or rashes, new musculoskeletal complaints other than usual "aches and pains".   PHYSICAL EXAM BP 162/86  Pulse 70  Resp 16  Ht 5\' 6"  (1.676 m)  Wt 209 lb 4.8 oz (94.938 kg)  BMI 33.80 kg/m2 Repeat blood pressure 132/77 mm Hg  General: Alert, oriented x3, no distress Head: no evidence of trauma, PERRL, EOMI, no exophtalmos or lid lag, no myxedema, no xanthelasma; normal ears, nose and oropharynx Neck: normal jugular venous pulsations and no hepatojugular reflux; brisk carotid pulses without delay and no carotid bruits Chest: clear to auscultation, no signs of consolidation by percussion or palpation, normal fremitus, symmetrical and full respiratory excursions Cardiovascular: normal position and quality of the apical impulse, regular rhythm, normal first and second heart sounds, no murmurs, rubs or gallops Abdomen: no tenderness or distention, no masses by palpation, no abnormal pulsatility or arterial bruits, normal bowel sounds, no hepatosplenomegaly Extremities: no clubbing, cyanosis or edema; 2+ radial, ulnar and brachial pulses bilaterally; 2+ right femoral, posterior tibial and dorsalis pedis pulses; 2+ left femoral, posterior tibial and dorsalis pedis pulses; no subclavian or femoral bruits Neurological: grossly nonfocal   EKG: AV sequential pacing   BMET    Component Value Date/Time   NA 144 02/27/2012 1331   K 4.0 02/27/2012 1331   CL 103 02/27/2012 1331   CO2 31 02/27/2012 1331   GLUCOSE 134* 02/27/2012 1331   BUN 19 02/27/2012 1331   CREATININE 0.60 02/27/2012 1331   CALCIUM 9.4 02/27/2012 1331   GFRNONAA 89* 02/27/2012 1331   GFRAA >90 02/27/2012 1331     ASSESSMENT AND PLAN CHB (complete heart block)    Pacemaker - dual chamber [place] Jude, 2009 - Pacemaker dependent [address] [place], serial number [id number], dual-chamber pacemaker , 2009. Continue in office followup every 6 months. Reminded about the importance of avoiding situations with potential electromagnetic interference.  HTN (hypertension)  Her blood pressure was high on arrival, much better after she rested for a few minutes. No changes are made her medications. Hyperlipidemia I don't have her most recent lipid profile, will ask for a copy from [facility] This Encounter  Procedures  . EKG 12-Lead   Meds ordered this encounter  Medications  . atenolol (TENORMIN) 25 MG tablet    Sig: Take 25 mg by mouth daily.    Holli Humbles, MD, [place] [phone] office [phone] pager

## 2014-05-02 NOTE — Patient Instructions (Signed)
Your physician recommends that you schedule a follow-up appointment in: 6 months with Dr.[name] + pacemaker check   

## 2014-05-25 DIAGNOSIS — I10 Essential (primary) hypertension: Secondary | ICD-10-CM | POA: Diagnosis not present

## 2014-05-25 DIAGNOSIS — IMO0002 Reserved for concepts with insufficient information to code with codable children: Secondary | ICD-10-CM | POA: Diagnosis not present

## 2014-05-30 ENCOUNTER — Other Ambulatory Visit: Payer: Self-pay | Admitting: Neurosurgery

## 2014-05-30 DIAGNOSIS — M5416 Radiculopathy, lumbar region: Secondary | ICD-10-CM

## 2014-05-31 ENCOUNTER — Encounter: Payer: Self-pay | Admitting: Cardiovascular Disease

## 2014-06-07 DIAGNOSIS — Z23 Encounter for immunization: Secondary | ICD-10-CM | POA: Diagnosis not present

## 2014-06-09 ENCOUNTER — Ambulatory Visit
Admission: RE | Admit: 2014-06-09 | Discharge: 2014-06-09 | Disposition: A | Discharge status: Home / Self Care | Payer: Medicare Other | Source: Ambulatory Visit | Attending: Neurosurgery | Admitting: Neurosurgery

## 2014-06-09 VITALS — BP 163/68 | HR 68

## 2014-06-09 DIAGNOSIS — M5416 Radiculopathy, lumbar region: Secondary | ICD-10-CM | POA: Diagnosis not present

## 2014-06-09 MED ORDER — IOHEXOL 180 MG/ML  SOLN: 15.0000 mL | Freq: Once | INTRAMUSCULAR | Status: AC | PRN

## 2014-06-09 MED ORDER — DIAZEPAM 5 MG PO TABS
5.0000 mg | ORAL_TABLET | Freq: Once | ORAL | Status: AC
  Administered 2014-06-09: 5 mg via ORAL

## 2014-06-09 NOTE — Discharge Instructions (Signed)

## 2014-06-15 DIAGNOSIS — M1711 Unilateral primary osteoarthritis, right knee: Secondary | ICD-10-CM | POA: Diagnosis not present

## 2014-06-29 DIAGNOSIS — Z6834 Body mass index (BMI) 34.0-34.9, adult: Secondary | ICD-10-CM | POA: Diagnosis not present

## 2014-06-29 DIAGNOSIS — I10 Essential (primary) hypertension: Secondary | ICD-10-CM | POA: Diagnosis not present

## 2014-06-29 DIAGNOSIS — M5136 Other intervertebral disc degeneration, lumbar region: Secondary | ICD-10-CM | POA: Diagnosis not present

## 2014-07-18 DIAGNOSIS — M5416 Radiculopathy, lumbar region: Secondary | ICD-10-CM | POA: Diagnosis not present

## 2014-07-18 DIAGNOSIS — M5136 Other intervertebral disc degeneration, lumbar region: Secondary | ICD-10-CM | POA: Diagnosis not present

## 2014-08-17 DIAGNOSIS — M5136 Other intervertebral disc degeneration, lumbar region: Secondary | ICD-10-CM | POA: Diagnosis not present

## 2014-08-17 DIAGNOSIS — M5416 Radiculopathy, lumbar region: Secondary | ICD-10-CM | POA: Diagnosis not present

## 2014-09-07 DIAGNOSIS — M5136 Other intervertebral disc degeneration, lumbar region: Secondary | ICD-10-CM | POA: Diagnosis not present

## 2014-09-19 DIAGNOSIS — M5136 Other intervertebral disc degeneration, lumbar region: Secondary | ICD-10-CM | POA: Diagnosis not present

## 2014-09-19 DIAGNOSIS — L821 Other seborrheic keratosis: Secondary | ICD-10-CM | POA: Diagnosis not present

## 2014-09-19 DIAGNOSIS — M5440 Lumbago with sciatica, unspecified side: Secondary | ICD-10-CM | POA: Diagnosis not present

## 2014-09-20 DIAGNOSIS — I10 Essential (primary) hypertension: Secondary | ICD-10-CM | POA: Diagnosis not present

## 2014-09-20 DIAGNOSIS — R0600 Dyspnea, unspecified: Secondary | ICD-10-CM | POA: Diagnosis not present

## 2014-09-20 DIAGNOSIS — Z950 Presence of cardiac pacemaker: Secondary | ICD-10-CM | POA: Diagnosis not present

## 2014-09-20 DIAGNOSIS — R5383 Other fatigue: Secondary | ICD-10-CM | POA: Diagnosis not present

## 2014-09-20 DIAGNOSIS — E780 Pure hypercholesterolemia: Secondary | ICD-10-CM | POA: Diagnosis not present

## 2014-09-20 DIAGNOSIS — I442 Atrioventricular block, complete: Secondary | ICD-10-CM | POA: Diagnosis not present

## 2014-09-20 DIAGNOSIS — M4806 Spinal stenosis, lumbar region: Secondary | ICD-10-CM | POA: Diagnosis not present

## 2014-09-20 DIAGNOSIS — E119 Type 2 diabetes mellitus without complications: Secondary | ICD-10-CM | POA: Diagnosis not present

## 2014-10-17 DIAGNOSIS — M5440 Lumbago with sciatica, unspecified side: Secondary | ICD-10-CM | POA: Diagnosis not present

## 2014-10-17 DIAGNOSIS — M5416 Radiculopathy, lumbar region: Secondary | ICD-10-CM | POA: Diagnosis not present

## 2014-10-17 DIAGNOSIS — Z6834 Body mass index (BMI) 34.0-34.9, adult: Secondary | ICD-10-CM | POA: Diagnosis not present

## 2014-10-17 DIAGNOSIS — R030 Elevated blood-pressure reading, without diagnosis of hypertension: Secondary | ICD-10-CM | POA: Diagnosis not present

## 2014-10-17 DIAGNOSIS — M5136 Other intervertebral disc degeneration, lumbar region: Secondary | ICD-10-CM | POA: Diagnosis not present

## 2014-10-31 ENCOUNTER — Encounter: Payer: Self-pay | Admitting: *Deleted

## 2014-10-31 ENCOUNTER — Encounter: Payer: Medicare Other | Admitting: Cardiovascular Disease

## 2014-12-06 ENCOUNTER — Encounter (HOSPITAL_COMMUNITY): Payer: Self-pay | Admitting: Neurology

## 2014-12-06 ENCOUNTER — Inpatient Hospital Stay (HOSPITAL_COMMUNITY)
Admission: EM | Admit: 2014-12-06 | Discharge: 2014-12-15 | DRG: 286 | Disposition: A | Discharge status: Home / Self Care | Payer: Medicare Other | Attending: Cardiovascular Disease | Admitting: Cardiovascular Disease

## 2014-12-06 ENCOUNTER — Emergency Department (HOSPITAL_COMMUNITY): Payer: Medicare Other

## 2014-12-06 DIAGNOSIS — G4730 Sleep apnea, unspecified: Secondary | ICD-10-CM | POA: Insufficient documentation

## 2014-12-06 DIAGNOSIS — R0600 Dyspnea, unspecified: Secondary | ICD-10-CM | POA: Insufficient documentation

## 2014-12-06 DIAGNOSIS — J9601 Acute respiratory failure with hypoxia: Secondary | ICD-10-CM | POA: Diagnosis not present

## 2014-12-06 DIAGNOSIS — I2510 Atherosclerotic heart disease of native coronary artery without angina pectoris: Secondary | ICD-10-CM | POA: Diagnosis not present

## 2014-12-06 DIAGNOSIS — E119 Type 2 diabetes mellitus without complications: Secondary | ICD-10-CM | POA: Diagnosis not present

## 2014-12-06 DIAGNOSIS — Z950 Presence of cardiac pacemaker: Secondary | ICD-10-CM | POA: Diagnosis present

## 2014-12-06 DIAGNOSIS — I110 Hypertensive heart disease with heart failure: Secondary | ICD-10-CM | POA: Diagnosis not present

## 2014-12-06 DIAGNOSIS — I5031 Acute diastolic (congestive) heart failure: Secondary | ICD-10-CM | POA: Diagnosis not present

## 2014-12-06 DIAGNOSIS — R0602 Shortness of breath: Secondary | ICD-10-CM

## 2014-12-06 DIAGNOSIS — R05 Cough: Secondary | ICD-10-CM | POA: Diagnosis not present

## 2014-12-06 DIAGNOSIS — E785 Hyperlipidemia, unspecified: Secondary | ICD-10-CM | POA: Diagnosis present

## 2014-12-06 DIAGNOSIS — M1990 Unspecified osteoarthritis, unspecified site: Secondary | ICD-10-CM | POA: Diagnosis present

## 2014-12-06 DIAGNOSIS — J449 Chronic obstructive pulmonary disease, unspecified: Secondary | ICD-10-CM | POA: Diagnosis present

## 2014-12-06 DIAGNOSIS — I442 Atrioventricular block, complete: Secondary | ICD-10-CM | POA: Diagnosis present

## 2014-12-06 DIAGNOSIS — G4733 Obstructive sleep apnea (adult) (pediatric): Secondary | ICD-10-CM | POA: Diagnosis not present

## 2014-12-06 DIAGNOSIS — E1165 Type 2 diabetes mellitus with hyperglycemia: Secondary | ICD-10-CM | POA: Diagnosis present

## 2014-12-06 DIAGNOSIS — J810 Acute pulmonary edema: Secondary | ICD-10-CM | POA: Diagnosis not present

## 2014-12-06 DIAGNOSIS — I272 Other secondary pulmonary hypertension: Secondary | ICD-10-CM | POA: Diagnosis present

## 2014-12-06 DIAGNOSIS — F419 Anxiety disorder, unspecified: Secondary | ICD-10-CM | POA: Diagnosis present

## 2014-12-06 DIAGNOSIS — I5042 Chronic combined systolic (congestive) and diastolic (congestive) heart failure: Secondary | ICD-10-CM

## 2014-12-06 DIAGNOSIS — I872 Venous insufficiency (chronic) (peripheral): Secondary | ICD-10-CM | POA: Diagnosis present

## 2014-12-06 DIAGNOSIS — Z9049 Acquired absence of other specified parts of digestive tract: Secondary | ICD-10-CM | POA: Diagnosis present

## 2014-12-06 DIAGNOSIS — Z0389 Encounter for observation for other suspected diseases and conditions ruled out: Secondary | ICD-10-CM

## 2014-12-06 DIAGNOSIS — R0902 Hypoxemia: Secondary | ICD-10-CM | POA: Diagnosis not present

## 2014-12-06 DIAGNOSIS — I10 Essential (primary) hypertension: Secondary | ICD-10-CM | POA: Diagnosis not present

## 2014-12-06 DIAGNOSIS — Z87891 Personal history of nicotine dependence: Secondary | ICD-10-CM

## 2014-12-06 DIAGNOSIS — I5032 Chronic diastolic (congestive) heart failure: Secondary | ICD-10-CM

## 2014-12-06 DIAGNOSIS — E780 Pure hypercholesterolemia: Secondary | ICD-10-CM | POA: Diagnosis present

## 2014-12-06 DIAGNOSIS — R0609 Other forms of dyspnea: Secondary | ICD-10-CM | POA: Insufficient documentation

## 2014-12-06 DIAGNOSIS — I270 Primary pulmonary hypertension: Secondary | ICD-10-CM | POA: Diagnosis not present

## 2014-12-06 DIAGNOSIS — G629 Polyneuropathy, unspecified: Secondary | ICD-10-CM | POA: Diagnosis present

## 2014-12-06 DIAGNOSIS — I509 Heart failure, unspecified: Secondary | ICD-10-CM | POA: Insufficient documentation

## 2014-12-06 DIAGNOSIS — R32 Unspecified urinary incontinence: Secondary | ICD-10-CM | POA: Diagnosis present

## 2014-12-06 DIAGNOSIS — I152 Hypertension secondary to endocrine disorders: Secondary | ICD-10-CM | POA: Diagnosis present

## 2014-12-06 DIAGNOSIS — R5381 Other malaise: Secondary | ICD-10-CM | POA: Diagnosis not present

## 2014-12-06 DIAGNOSIS — E1142 Type 2 diabetes mellitus with diabetic polyneuropathy: Secondary | ICD-10-CM | POA: Diagnosis present

## 2014-12-06 DIAGNOSIS — I2721 Secondary pulmonary arterial hypertension: Secondary | ICD-10-CM

## 2014-12-06 DIAGNOSIS — I5030 Unspecified diastolic (congestive) heart failure: Secondary | ICD-10-CM

## 2014-12-06 DIAGNOSIS — I5041 Acute combined systolic (congestive) and diastolic (congestive) heart failure: Secondary | ICD-10-CM | POA: Diagnosis not present

## 2014-12-06 DIAGNOSIS — IMO0001 Reserved for inherently not codable concepts without codable children: Secondary | ICD-10-CM

## 2014-12-06 DIAGNOSIS — E1169 Type 2 diabetes mellitus with other specified complication: Secondary | ICD-10-CM | POA: Diagnosis present

## 2014-12-06 HISTORY — DX: Acute myocardial infarction, unspecified: I21.9

## 2014-12-06 HISTORY — DX: Type 2 diabetes mellitus without complications: E11.9

## 2014-12-06 HISTORY — DX: Unspecified urinary incontinence: R32

## 2014-12-06 HISTORY — DX: Heart failure, unspecified: I50.9

## 2014-12-06 HISTORY — DX: Monoplegia of upper limb affecting right dominant side: G83.21

## 2014-12-06 HISTORY — DX: Other chronic pain: G89.29

## 2014-12-06 LAB — BASIC METABOLIC PANEL
ANION GAP: 10 (ref 5–15)
BUN: 16 mg/dL (ref 6–23)
CALCIUM: 8.4 mg/dL (ref 8.4–10.5)
CO2: 30 mmol/L (ref 19–32)
Chloride: 100 mmol/L (ref 96–112)
Creatinine, Ser: 0.54 mg/dL (ref 0.50–1.10)
GFR calc Af Amer: >90 mL/min (ref >90)
GLUCOSE: 137 mg/dL — AB (ref 70–99)
Potassium: 4.2 mmol/L (ref 3.5–5.1)
SODIUM: 140 mmol/L (ref 135–145)

## 2014-12-06 LAB — CBC WITH DIFFERENTIAL/PLATELET
Basophils Absolute: 0 10*3/uL (ref 0.0–0.1)
Basophils Relative: 0 % (ref 0–1)
EOS PCT: 0 % (ref 0–5)
Eosinophils Absolute: 0 10*3/uL (ref 0.0–0.7)
HEMATOCRIT: 37.8 % (ref 36.0–46.0)
HEMOGLOBIN: 12.1 g/dL (ref 12.0–15.0)
LYMPHS ABS: 1.3 10*3/uL (ref 0.7–4.0)
Lymphocytes Relative: 13 % (ref 12–46)
MCH: 29.8 pg (ref 26.0–34.0)
MCHC: 32 g/dL (ref 30.0–36.0)
MCV: 93.1 fL (ref 78.0–100.0)
MONOS PCT: 7 % (ref 3–12)
Monocytes Absolute: 0.7 10*3/uL (ref 0.1–1.0)
Neutro Abs: 7.4 10*3/uL (ref 1.7–7.7)
Neutrophils Relative %: 80 % — ABNORMAL HIGH (ref 43–77)
PLATELETS: 196 10*3/uL (ref 150–400)
RBC: 4.06 MIL/uL (ref 3.87–5.11)
RDW: 14.1 % (ref 11.5–15.5)
WBC: 9.3 10*3/uL (ref 4.0–10.5)

## 2014-12-06 LAB — URINALYSIS, ROUTINE W REFLEX MICROSCOPIC
BILIRUBIN URINE: NEGATIVE
Glucose, UA: NEGATIVE mg/dL
HGB URINE DIPSTICK: NEGATIVE
Ketones, ur: NEGATIVE mg/dL
LEUKOCYTES UA: NEGATIVE
Nitrite: NEGATIVE
PROTEIN: NEGATIVE mg/dL
Specific Gravity, Urine: 1.006 (ref 1.005–1.030)
Urobilinogen, UA: 0.2 mg/dL (ref 0.0–1.0)
pH: 7 (ref 5.0–8.0)

## 2014-12-06 LAB — TROPONIN I: TROPONIN I: 0.03 ng/mL (ref <0.031)

## 2014-12-06 LAB — BRAIN NATRIURETIC PEPTIDE: B Natriuretic Peptide: 599.9 pg/mL — ABNORMAL HIGH (ref 0.0–100.0)

## 2014-12-06 MED ORDER — FUROSEMIDE 10 MG/ML IJ SOLN
40.0000 mg | Freq: Two times a day (BID) | INTRAMUSCULAR | Status: DC
  Administered 2014-12-07 – 2014-12-09 (×5): 40 mg via INTRAVENOUS
  Filled 2014-12-06 (×7): qty 4

## 2014-12-06 MED ORDER — HEPARIN SODIUM (PORCINE) 5000 UNIT/ML IJ SOLN
5000.0000 [IU] | Freq: Three times a day (TID) | INTRAMUSCULAR | Status: DC
  Administered 2014-12-07 – 2014-12-13 (×20): 5000 [IU] via SUBCUTANEOUS
  Filled 2014-12-06 (×26): qty 1

## 2014-12-06 MED ORDER — ATENOLOL 25 MG PO TABS
25.0000 mg | ORAL_TABLET | Freq: Every day | ORAL | Status: DC
  Administered 2014-12-06 – 2014-12-15 (×9): 25 mg via ORAL
  Filled 2014-12-06 (×10): qty 1

## 2014-12-06 MED ORDER — ONDANSETRON HCL 4 MG/2ML IJ SOLN: 4.0000 mg | Freq: Four times a day (QID) | INTRAMUSCULAR | Status: DC | PRN

## 2014-12-06 MED ORDER — SODIUM CHLORIDE 0.9 % IJ SOLN
3.0000 mL | Freq: Two times a day (BID) | INTRAMUSCULAR | Status: DC
  Administered 2014-12-06 – 2014-12-15 (×17): 3 mL via INTRAVENOUS

## 2014-12-06 MED ORDER — ACETAMINOPHEN 325 MG PO TABS
650.0000 mg | ORAL_TABLET | ORAL | Status: DC | PRN
  Administered 2014-12-06 – 2014-12-14 (×15): 650 mg via ORAL
  Filled 2014-12-06 (×15): qty 2

## 2014-12-06 MED ORDER — LISINOPRIL 10 MG PO TABS
5.0000 mg | ORAL_TABLET | Freq: Once | ORAL | Status: AC
  Administered 2014-12-06: 5 mg via ORAL
  Filled 2014-12-06: qty 1

## 2014-12-06 MED ORDER — SODIUM CHLORIDE 0.9 % IV SOLN: 250.0000 mL | INTRAVENOUS | Status: DC | PRN

## 2014-12-06 MED ORDER — ASPIRIN EC 81 MG PO TBEC
81.0000 mg | DELAYED_RELEASE_TABLET | Freq: Every day | ORAL | Status: DC
  Administered 2014-12-07 – 2014-12-13 (×7): 81 mg via ORAL
  Filled 2014-12-06 (×7): qty 1

## 2014-12-06 MED ORDER — FUROSEMIDE 10 MG/ML IJ SOLN
40.0000 mg | Freq: Once | INTRAMUSCULAR | Status: AC
  Administered 2014-12-06: 40 mg via INTRAVENOUS
  Filled 2014-12-06: qty 4

## 2014-12-06 MED ORDER — PRAVASTATIN SODIUM 40 MG PO TABS
40.0000 mg | ORAL_TABLET | Freq: Every day | ORAL | Status: DC
  Administered 2014-12-06 – 2014-12-15 (×9): 40 mg via ORAL
  Filled 2014-12-06 (×11): qty 1

## 2014-12-06 MED ORDER — SODIUM CHLORIDE 0.9 % IJ SOLN: 3.0000 mL | INTRAMUSCULAR | Status: DC | PRN

## 2014-12-06 MED ORDER — FUROSEMIDE 10 MG/ML IJ SOLN
20.0000 mg | Freq: Once | INTRAMUSCULAR | Status: AC
  Administered 2014-12-06: 20 mg via INTRAVENOUS
  Filled 2014-12-06: qty 2

## 2014-12-06 MED ORDER — LISINOPRIL 5 MG PO TABS
5.0000 mg | ORAL_TABLET | Freq: Every day | ORAL | Status: DC
  Administered 2014-12-07 – 2014-12-15 (×8): 5 mg via ORAL
  Filled 2014-12-06 (×9): qty 1

## 2014-12-06 MED ORDER — METFORMIN HCL ER 500 MG PO TB24
500.0000 mg | ORAL_TABLET | Freq: Every day | ORAL | Status: DC
  Administered 2014-12-06 – 2014-12-13 (×8): 500 mg via ORAL
  Filled 2014-12-06 (×10): qty 1

## 2014-12-06 MED ORDER — HYDRALAZINE HCL 20 MG/ML IJ SOLN
10.0000 mg | Freq: Once | INTRAMUSCULAR | Status: AC
  Administered 2014-12-06: 10 mg via INTRAVENOUS
  Filled 2014-12-06: qty 1

## 2014-12-06 MED ORDER — GABAPENTIN 300 MG PO CAPS
300.0000 mg | ORAL_CAPSULE | Freq: Three times a day (TID) | ORAL | Status: DC
  Administered 2014-12-06 – 2014-12-15 (×26): 300 mg via ORAL
  Filled 2014-12-06 (×30): qty 1

## 2014-12-06 MED ORDER — NITROGLYCERIN 2 % TD OINT
1.0000 [in_us] | TOPICAL_OINTMENT | Freq: Once | TRANSDERMAL | Status: AC
  Administered 2014-12-06: 1 [in_us] via TOPICAL
  Filled 2014-12-06: qty 1

## 2014-12-06 MED ORDER — POTASSIUM CHLORIDE CRYS ER 20 MEQ PO TBCR
20.0000 meq | EXTENDED_RELEASE_TABLET | Freq: Every day | ORAL | Status: DC
  Administered 2014-12-06 – 2014-12-15 (×9): 20 meq via ORAL
  Filled 2014-12-06 (×10): qty 1

## 2014-12-06 NOTE — ED Notes (Signed)
Pt O2 continuously dropping below 89%, placed on 4L instead 2L. On 4L pt O2 is now 95%

## 2014-12-06 NOTE — ED Notes (Signed)
Per EMS- coming from [facility] for sob x 3 days. Pt sob at rest. Initial home oxygen 80's, placed on 4 L was 94%. BP 184/94, HR 87 , RR 20. EKG LBBB, LVH. Pt is a x 4. Denies pain.

## 2014-12-06 NOTE — H&P (Signed)
Cardiologist:  Dr. Sallyanne Kuster  Chief Complaint: Worsening shortness of breath  HPI:  The patient is a 74yo female with a history of HTN, HLD, DM,   She is pacemaker dependent with complete heart block since 2009. Comprehensive check of her pacemaker on 05/02/14 showed normal function and no evidence of significant arrhythmia. Her device is a Radio broadcast assistant that is not capable of remote monitoring. Battery longevity is estimated at 4-5 years. Roughly 70% atrial pacing and 100% ventricular pacing. No meaningful tachyarrhythmia was recorded.  She had normal coronary arteries by angiography a long time ago in 2002. She does not have problems withvalvular abnormalities and has normal left ventricular systolic function by echo 2009.   The patient reports her husband passed away about two weeks ago after having PNA.  She's been getting progressively SOB over the last two weeks with orthopnea, PND and swelling in her abdomen. Some coughing which is worse when laying down.  Very little LEE.  The patient currently denies nausea, vomiting, fever, chest pain, congestion, abdominal pain, hematochezia, melena, claudication.  I think she's had a lot of meals prepared for her lately.   She was at her PCP's office today and then transferred via EMS to the ER.  She felt weak in the legs when there and almost fell.  Urine output has not changed.   Medications:    Medication Sig  aspirin EC 81 MG tablet Take 81 mg by mouth daily.  atenolol (TENORMIN) 25 MG tablet Take 25 mg by mouth daily.  gabapentin (NEURONTIN) 300 MG capsule Take 300 mg by mouth 3 (three) times daily.  metFORMIN (GLUCOPHAGE-XR) 500 MG 24 hr tablet Take 500 mg by mouth daily.  Multiple Vitamins-Minerals (MULTIVITAMIN PO) Take by mouth daily.  pravastatin (PRAVACHOL) 40 MG tablet Take 40 mg by mouth daily.    Past Medical History  Diagnosis Date  . Hypertension   . Hypercholesterolemia   . Pacemaker   . Dysrhythmia   . Diabetes mellitus    hga1c 7.1 no meds  . Neuropathy of both feet   . Shortness of breath     exertional  . Coronary artery disease     normal coronaries by 09/10/00 cath  . Venous insufficiency (chronic) (peripheral)   . Arthritis     in my hands   . CHB (complete heart block) 09/19/2013  . HTN (hypertension) 09/19/2013  . Normal coronary arteries angiography 2002 09/19/2013  . Hyperlipidemia 09/19/2013    Past Surgical History  Procedure Laterality Date  . Cardiac pacemaker placement  12/01/2007    St.Jude Zephyr XLDR #[id number], dual chamber  . Anterior cervical decomp/discectomy fusion  2007  . Cholecystectomy  1995  . Lumbar laminectomy  03/02/2012  . Lumbar laminectomy/decompression microdiscectomy  03/02/2012    Procedure: LUMBAR LAMINECTOMY/DECOMPRESSION MICRODISCECTOMY 2 LEVELS;  Surgeon: Floyce Stakes, MD;  Location: [facility];  Service: Neurosurgery;  Laterality: Bilateral;  Lumbar three, lumbar four, lumbar five Laminectomy/Foraminotomy  . Transthoracic echocardiogram  11/30/2007    normal echo, mean transaortic valve gradient 10mHg  . Nm myocar perf wall motion  10/24/2008    protocol:Lexiscan, EF63%, normal study, scan negative for ischemia  . Cardiovascular stress test  08/28/2000    Cardiolite perfusion study, EF58%, low risk study,   . Cardiac catheterization  09/10/2000    mormal Coronary arteries, EF60%,   . Lower ext. dopplers  06/12/2011    no evidence of thrombus, all vessels normal in size, no evidence of  insuffiency    Family History  Problem Relation Age of Onset  . Diabetes Mother   . Heart disease Mother   . Hyperlipidemia Mother   . Hypertension Mother   . Diabetes Father   . Heart disease Father     before age 1  . Hyperlipidemia Father   . Hypertension Father   . Heart attack Father    Social History:  reports that she quit smoking about 22 years ago. Her smoking use included Cigarettes. She has a 40 pack-year smoking history. She has never used smokeless  tobacco. She reports that she does not drink alcohol or use illicit drugs.  Allergies: No Known Allergies   (Not in a hospital admission)  Results for orders placed or performed during the hospital encounter of 12/06/14 (from the past 48 hour(s))  CBC with Differential/Platelet     Status: Abnormal   Collection Time: 12/06/14 12:00 PM  Result Value Ref Range   WBC 9.3 4.0 - 10.5 K/uL   RBC 4.06 3.87 - 5.11 MIL/uL   Hemoglobin 12.1 12.0 - 15.0 g/dL   HCT 37.8 36.0 - 46.0 %   MCV 93.1 78.0 - 100.0 fL   MCH 29.8 26.0 - 34.0 pg   MCHC 32.0 30.0 - 36.0 g/dL   RDW 14.1 11.5 - 15.5 %   Platelets 196 150 - 400 K/uL   Neutrophils Relative % 80 (H) 43 - 77 %   Neutro Abs 7.4 1.7 - 7.7 K/uL   Lymphocytes Relative 13 12 - 46 %   Lymphs Abs 1.3 0.7 - 4.0 K/uL   Monocytes Relative 7 3 - 12 %   Monocytes Absolute 0.7 0.1 - 1.0 K/uL   Eosinophils Relative 0 0 - 5 %   Eosinophils Absolute 0.0 0.0 - 0.7 K/uL   Basophils Relative 0 0 - 1 %   Basophils Absolute 0.0 0.0 - 0.1 K/uL  Brain natriuretic peptide     Status: Abnormal   Collection Time: 12/06/14 12:00 PM  Result Value Ref Range   B Natriuretic Peptide 599.9 (H) 0.0 - 100.0 pg/mL  Basic metabolic panel     Status: Abnormal   Collection Time: 12/06/14 12:00 PM  Result Value Ref Range   Sodium 140 135 - 145 mmol/L   Potassium 4.2 3.5 - 5.1 mmol/L   Chloride 100 96 - 112 mmol/L   CO2 30 19 - 32 mmol/L   Glucose, Bld 137 (H) 70 - 99 mg/dL   BUN 16 6 - 23 mg/dL   Creatinine, Ser 0.54 0.50 - 1.10 mg/dL   Calcium 8.4 8.4 - 10.5 mg/dL   GFR calc non Af Amer >90 >90 mL/min   GFR calc Af Amer >90 >90 mL/min    Comment: (NOTE) The eGFR has been calculated using the CKD EPI equation. This calculation has not been validated in all clinical situations. eGFR's persistently <90 mL/min signify possible Chronic Kidney Disease.    Anion gap 10 5 - 15  Troponin I     Status: None   Collection Time: 12/06/14 12:00 PM  Result Value Ref Range    Troponin I 0.03 <0.031 ng/mL    Comment:        NO INDICATION OF MYOCARDIAL INJURY.   Urinalysis, Routine w reflex microscopic     Status: None   Collection Time: 12/06/14  4:42 PM  Result Value Ref Range   Color, Urine YELLOW YELLOW   APPearance CLEAR CLEAR   Specific Gravity, Urine 1.006 1.005 -  1.030   pH 7.0 5.0 - 8.0   Glucose, UA NEGATIVE NEGATIVE mg/dL   Hgb urine dipstick NEGATIVE NEGATIVE   Bilirubin Urine NEGATIVE NEGATIVE   Ketones, ur NEGATIVE NEGATIVE mg/dL   Protein, ur NEGATIVE NEGATIVE mg/dL   Urobilinogen, UA 0.2 0.0 - 1.0 mg/dL   Nitrite NEGATIVE NEGATIVE   Leukocytes, UA NEGATIVE NEGATIVE    Comment: MICROSCOPIC NOT DONE ON URINES WITH NEGATIVE PROTEIN, BLOOD, LEUKOCYTES, NITRITE, OR GLUCOSE <1000 mg/dL.   Dg Chest Port 1 View  12/06/2014   CLINICAL DATA:  Shortness of Breath  EXAM: PORTABLE CHEST - 1 VIEW  COMPARISON:  February 27, 2012  FINDINGS: There is underlying emphysematous change. There is cardiomegaly with pulmonary venous hypertension and interstitial edema. There is no airspace consolidation. Pacemaker leads are attached to the right atrium and right ventricle. No adenopathy. There is postoperative change in the lower cervical spine.  IMPRESSION: Congestive heart failure superimposed on emphysematous change. No airspace consolidation.   Electronically Signed   By: Lowella Grip III M.D.   On: 12/06/2014 12:59    Review of Systems  Constitutional: Negative for fever and diaphoresis.  HENT: Negative for congestion.   Respiratory: Positive for cough and shortness of breath.   Cardiovascular: Positive for orthopnea, leg swelling and PND. Negative for chest pain.  Gastrointestinal: Negative for nausea, vomiting, abdominal pain, blood in stool and melena.  Genitourinary: Negative for hematuria.  Musculoskeletal: Negative for myalgias.  Neurological: Positive for dizziness and weakness.  All other systems reviewed and are negative.   Blood pressure 146/45,  pulse 77, temperature 99.8 F (37.7 C), temperature source Oral, resp. rate 27, weight 214 lb 1 oz (97.098 kg), SpO2 98 %. Physical Exam  Nursing note and vitals reviewed. Constitutional: She is oriented to person, place, and time. She appears well-developed. No distress.  Obese   HENT:  Head: Normocephalic and atraumatic.  Mouth/Throat: No oropharyngeal exudate.  Eyes: EOM are normal. Pupils are equal, round, and reactive to light. No scleral icterus.  Neck: Normal range of motion. Neck supple. JVD present.  Cardiovascular: Normal rate, regular rhythm, S1 normal and S2 normal.   No murmur heard. Pulses:      Radial pulses are 2+ on the right side, and 2+ on the left side.       Dorsalis pedis pulses are 2+ on the right side, and 2+ on the left side.  Respiratory: Effort normal. She has no wheezes. She has rales.  Decreased BS throughout  GI: Soft. Bowel sounds are normal. She exhibits distension. There is no tenderness.  Musculoskeletal: She exhibits edema (trace LEE).  Lymphadenopathy:    She has no cervical adenopathy.  Neurological: She is alert and oriented to person, place, and time. She exhibits normal muscle tone.  Skin: Skin is warm and dry.  Psychiatric: She has a normal mood and affect.     Assessment/Plan Principal Problem:   Accelerated hypertension with diastolic congestive heart failure, NYHA class 3 Active Problems:   Acute congestive heart failure   CHB (complete heart block)   Pacemaker - dual chamber St Jude, 2009   Essential hypertension   Hyperlipidemia   Normal coronary arteries angiography 2002  The patient will be admitted to telemetry.  BNP  599.  She in clear volume overloaded.  Is it related to high sodium recently or a change in heart function?  We will start IV lasix 40 BID.  She already got a dose of 86m in the ER.  Will give another 50m now.  Add 234m K daily.  Check echo.  Interrogate PPM.  EKG:  Paced.  Continue atenolol.  Add lisinopril for  afterload and to improve BP.     HaTarri FullerPA-C 12/06/2014, 5:27 PM  I have seen, examined and evaluated the patient this PM along with Mr. HaSamara Snide After reviewing all the available data and chart,  I agree with his findings, examination as well as impression recommendations.  Very pleasant [age]ear old woman with long-standing hypertension and status post pacemaker placement for complete heart block. She is essentially pacemaker dependent with continuous ventricular pacing with intermittent native atrial rate versus atrial pacing.  She presents with progressively worsening dyspnea but is mostly exertional as well as orthopnea over the last couple weeks. She has been eating atypical foods over the last few weeks after the loss of her husband of 50+ years. She denies any chest tightness or pressure to suggest any ischemic etiology. She denies any headache or blurred vision suggest significant symptoms from hypertensive crisis. She is profoundly hypertensive on exam in the emergency room and is in at least mild to moderate respiratory distress while sitting up.  This is clearly suggestive of acute heart failure that is at least diastolic based on hypertension. This is associated with accelerated hypertension and diastolic heart failure. We do not know what her current systolic function is but with previously normal. This needs to be evaluated echocardiogram. She'll be admitted for pressure control/afterload reduction and diuresis. She was started on an ACE inhibitor along with her beta blocker. Emergency room. Glycerin paste and I have given her IV hydralazine.  She will need 2-D echocardiogram in the morning. IV diuresis as noted above. Monitor potassium levels.  Interrogate pacemaker and sternal arrhythmias. No notable troponin elevation but elevated BNP. --Would only pursue ischemic evaluation if EF is down on the regional wall motion abnormality. Otherwise would consider this to be simply  related to dietary indiscretion.  Continue pravastatin for hyperlipidemia. She is on metformin for diabetes which we will continue for now but monitor renal function. -- Monitor blood sugar levels to determine whether or not she needs sliding scale coverage.  Subcutaneous heparin for prophylaxis.  Anticipate 3-4 day hospitalization depending on how quickly we can get her diuresed. She will be admitted to Dr. CrVictorino Decemberounding service. On gabapentin for neuropathy.   HALeonie ManM.D., M.S. Interventional Cardiologist   Pager # [phone]

## 2014-12-06 NOTE — ED Notes (Signed)
Charge RN made aware need for St. Jude's pacemaker device.

## 2014-12-06 NOTE — ED Notes (Signed)
Spoke with Tenny Craw, reports can order gabapentin for pain. Will notify pharmacy.

## 2014-12-06 NOTE — ED Provider Notes (Signed)
CSN: [id number]     Arrival date & time 12/06/14  1132 History   First MD Initiated Contact with Patient 12/06/14 1150     Chief Complaint  Patient presents with  . Shortness of Breath     (Consider location/radiation/quality/duration/timing/severity/associated sxs/prior Treatment) HPI Presents with shortness of breath progressively worsening onset 5 days ago. No chest pain no cough no other associated symptoms. Seen at [facility] by Dr.Ehinger immediately prior to coming here sent here for further evaluation. No treatment prior to coming here. Past Medical History  Diagnosis Date  . Hypertension   . Hypercholesterolemia   . Pacemaker   . Dysrhythmia   . Diabetes mellitus     hga1c 7.1 no meds  . Neuropathy of both feet   . Shortness of breath     exertional  . Coronary artery disease     normal coronaries by 09/10/00 cath  . Venous insufficiency (chronic) (peripheral)   . Arthritis     in my hands   . CHB (complete heart block) 09/19/2013  . HTN (hypertension) 09/19/2013  . Normal coronary arteries angiography 2002 09/19/2013  . Hyperlipidemia 09/19/2013   Past Surgical History  Procedure Laterality Date  . Cardiac pacemaker placement  12/01/2007    St.Jude Zephyr XLDR #[id number], dual chamber  . Anterior cervical decomp/discectomy fusion  2007  . Cholecystectomy  1995  . Lumbar laminectomy  03/02/2012  . Lumbar laminectomy/decompression microdiscectomy  03/02/2012    Procedure: LUMBAR LAMINECTOMY/DECOMPRESSION MICRODISCECTOMY 2 LEVELS;  Surgeon: Floyce Stakes, MD;  Location: [facility];  Service: Neurosurgery;  Laterality: Bilateral;  Lumbar three, lumbar four, lumbar five Laminectomy/Foraminotomy  . Transthoracic echocardiogram  11/30/2007    normal echo, mean transaortic valve gradient 29mmHg  . Nm myocar perf wall motion  10/24/2008    protocol:Lexiscan, EF63%, normal study, scan negative for ischemia  . Cardiovascular stress test  08/28/2000    Cardiolite  perfusion study, EF58%, low risk study,   . Cardiac catheterization  09/10/2000    mormal Coronary arteries, EF60%,   . Lower ext. dopplers  06/12/2011    no evidence of thrombus, all vessels normal in size, no evidence of insuffiency   Family History  Problem Relation Age of Onset  . Diabetes Mother   . Heart disease Mother   . Hyperlipidemia Mother   . Hypertension Mother   . Diabetes Father   . Heart disease Father     before age 17  . Hyperlipidemia Father   . Hypertension Father   . Heart attack Father    History  Substance Use Topics  . Smoking status: Former Smoker -- 1.00 packs/day for 40 years    Types: Cigarettes    Quit date: 03/02/1992  . Smokeless tobacco: Never Used     Comment: quit smoking 19 years ago  . Alcohol Use: No   OB History    No data available     Review of Systems  Respiratory: Positive for shortness of breath.   All other systems reviewed and are negative.     Allergies  Review of patient's allergies indicates no known allergies.  Home Medications   Prior to Admission medications   Medication Sig Start Date End Date Taking? Authorizing Provider  aspirin EC 81 MG tablet Take 81 mg by mouth daily.    Historical Provider, MD  atenolol (TENORMIN) 25 MG tablet Take 25 mg by mouth daily. 04/04/14   Historical Provider, MD  Multiple Vitamins-Minerals (MULTIVITAMIN PO) Take by mouth  daily.    Historical Provider, MD  pravastatin (PRAVACHOL) 40 MG tablet Take 40 mg by mouth daily.    Historical Provider, MD   BP 180/94 mmHg  Pulse 81  Temp(Src) 99.8 F (37.7 C) (Oral)  Resp 24  SpO2 92% Physical Exam  Constitutional: She is oriented to person, place, and time. She appears well-developed and well-nourished. She appears distressed.  Mild respiratory distress  HENT:  Head: Normocephalic and atraumatic.  Eyes: Conjunctivae are normal. Pupils are equal, round, and reactive to light.  Neck: Neck supple. No tracheal deviation present. No  thyromegaly present.  Cardiovascular: Normal rate and regular rhythm.   No murmur heard. Pulmonary/Chest: She is in respiratory distress.  Rales right base. Speaks in sentences. Mild respiratory distress  Abdominal: Soft. Bowel sounds are normal. She exhibits no distension. There is no tenderness.  Musculoskeletal: Normal range of motion. She exhibits edema. She exhibits no tenderness.  Trace pretibial pitting edema bilaterally  Neurological: She is alert and oriented to person, place, and time. Coordination normal.  Skin: Skin is warm and dry. No rash noted.  Psychiatric: She has a normal mood and affect.  Nursing note and vitals reviewed.   ED Course  Procedures (including critical care time) Labs Review Labs Reviewed - No data to display  Imaging Review No results found.   EKG Interpretation   Date/Time:  Wednesday December 06 2014 11:40:37 EDT Ventricular Rate:  80 PR Interval:  190 QRS Duration: 154 QT Interval:  430 QTC Calculation: 496 R Axis:   -87 Text Interpretation:  Age not entered, assumed to be  74 years old for  purpose of ECG interpretation Sinus rhythm Nonspecific IVCD with LAD Left  ventricular hypertrophy Inferior infarct, acute (RCA) Anterior infarct,  old Probable RV involvement, suggest recording right precordial leads  Baseline wander in lead(s) III V3 No significant change since last tracing  Confirmed by Winfred Leeds  MD, Rani Sisney [phone]) on 12/06/2014 11:45:55 AM     Patient placed on supplemental oxygen 4 L nasal cannula which increased pulse oximetry from 87% on room air, consistent with hypoxia to 96%, normal. Administered nitroglycerin ointment and intravenous Lasix here Chest x-ray viewed by me Results for orders placed or performed during the hospital encounter of 12/06/14  CBC with Differential/Platelet  Result Value Ref Range   WBC 9.3 4.0 - 10.5 K/uL   RBC 4.06 3.87 - 5.11 MIL/uL   Hemoglobin 12.1 12.0 - 15.0 g/dL   HCT 37.8 36.0 - 46.0 %   MCV  93.1 78.0 - 100.0 fL   MCH 29.8 26.0 - 34.0 pg   MCHC 32.0 30.0 - 36.0 g/dL   RDW 14.1 11.5 - 15.5 %   Platelets 196 150 - 400 K/uL   Neutrophils Relative % 80 (H) 43 - 77 %   Neutro Abs 7.4 1.7 - 7.7 K/uL   Lymphocytes Relative 13 12 - 46 %   Lymphs Abs 1.3 0.7 - 4.0 K/uL   Monocytes Relative 7 3 - 12 %   Monocytes Absolute 0.7 0.1 - 1.0 K/uL   Eosinophils Relative 0 0 - 5 %   Eosinophils Absolute 0.0 0.0 - 0.7 K/uL   Basophils Relative 0 0 - 1 %   Basophils Absolute 0.0 0.0 - 0.1 K/uL  Brain natriuretic peptide  Result Value Ref Range   B Natriuretic Peptide 599.9 (H) 0.0 - 100.0 pg/mL  Basic metabolic panel  Result Value Ref Range   Sodium 140 135 - 145 mmol/L  Potassium 4.2 3.5 - 5.1 mmol/L   Chloride 100 96 - 112 mmol/L   CO2 30 19 - 32 mmol/L   Glucose, Bld 137 (H) 70 - 99 mg/dL   BUN 16 6 - 23 mg/dL   Creatinine, Ser 0.54 0.50 - 1.10 mg/dL   Calcium 8.4 8.4 - 10.5 mg/dL   GFR calc non Af Amer >90 >90 mL/min   GFR calc Af Amer >90 >90 mL/min   Anion gap 10 5 - 15  Troponin I  Result Value Ref Range   Troponin I 0.03 <0.031 ng/mL   Dg Chest Port 1 View  12/06/2014   CLINICAL DATA:  Shortness of Breath  EXAM: PORTABLE CHEST - 1 VIEW  COMPARISON:  February 27, 2012  FINDINGS: There is underlying emphysematous change. There is cardiomegaly with pulmonary venous hypertension and interstitial edema. There is no airspace consolidation. Pacemaker leads are attached to the right atrium and right ventricle. No adenopathy. There is postoperative change in the lower cervical spine.  IMPRESSION: Congestive heart failure superimposed on emphysematous change. No airspace consolidation.   Electronically Signed   By: Lowella Grip III M.D.   On: 12/06/2014 12:59    MDM  Spoke with [facility] who will arrange for inpatient stay Final diagnoses:  None   Diagnosis #1 congestive heart failure #2 hypoxia #3 hyperglycemia    Orlie Dakin, MD 12/06/14 1356

## 2014-12-06 NOTE — ED Notes (Signed)
Pt left with brief and pads because of bowel incontinence

## 2014-12-07 ENCOUNTER — Encounter (HOSPITAL_COMMUNITY): Payer: Self-pay | Admitting: General Practice

## 2014-12-07 DIAGNOSIS — I5041 Acute combined systolic (congestive) and diastolic (congestive) heart failure: Secondary | ICD-10-CM

## 2014-12-07 LAB — BASIC METABOLIC PANEL
ANION GAP: 9 (ref 5–15)
BUN: 12 mg/dL (ref 6–23)
CO2: 35 mmol/L — ABNORMAL HIGH (ref 19–32)
Calcium: 8.3 mg/dL — ABNORMAL LOW (ref 8.4–10.5)
Chloride: 95 mmol/L — ABNORMAL LOW (ref 96–112)
Creatinine, Ser: 0.56 mg/dL (ref 0.50–1.10)
GFR, EST NON AFRICAN AMERICAN: 89 mL/min — AB (ref >90)
Glucose, Bld: 114 mg/dL — ABNORMAL HIGH (ref 70–99)
POTASSIUM: 3.8 mmol/L (ref 3.5–5.1)
Sodium: 139 mmol/L (ref 135–145)

## 2014-12-07 LAB — GLUCOSE, CAPILLARY
GLUCOSE-CAPILLARY: 121 mg/dL — AB (ref 70–99)
GLUCOSE-CAPILLARY: 146 mg/dL — AB (ref 70–99)
Glucose-Capillary: 176 mg/dL — ABNORMAL HIGH (ref 70–99)
Glucose-Capillary: 143 mg/dL — ABNORMAL HIGH (ref 70–99)

## 2014-12-07 MED ORDER — INSULIN ASPART 100 UNIT/ML ~~LOC~~ SOLN: 0.0000 [IU] | Freq: Every day | SUBCUTANEOUS | Status: DC

## 2014-12-07 MED ORDER — INSULIN ASPART 100 UNIT/ML ~~LOC~~ SOLN
0.0000 [IU] | Freq: Three times a day (TID) | SUBCUTANEOUS | Status: DC
  Administered 2014-12-07: 2 [IU] via SUBCUTANEOUS
  Administered 2014-12-08: 3 [IU] via SUBCUTANEOUS
  Administered 2014-12-08: 2 [IU] via SUBCUTANEOUS
  Administered 2014-12-08: 3 [IU] via SUBCUTANEOUS
  Administered 2014-12-09 (×3): 2 [IU] via SUBCUTANEOUS
  Administered 2014-12-10: 3 [IU] via SUBCUTANEOUS
  Administered 2014-12-11 (×2): 2 [IU] via SUBCUTANEOUS
  Administered 2014-12-12: 3 [IU] via SUBCUTANEOUS
  Administered 2014-12-12: 2 [IU] via SUBCUTANEOUS
  Administered 2014-12-12 – 2014-12-13 (×2): 3 [IU] via SUBCUTANEOUS
  Administered 2014-12-13 – 2014-12-14 (×3): 2 [IU] via SUBCUTANEOUS

## 2014-12-07 NOTE — Progress Notes (Signed)
Patient Name: [name] Date of Encounter: 12/07/2014  Principal Problem:   Accelerated hypertension with diastolic congestive heart failure, NYHA class 3 Active Problems:   CHB (complete heart block)   Pacemaker - dual chamber St Jude, 2009   Essential hypertension   Normal coronary arteries angiography 2002   Hyperlipidemia   Acute congestive heart failure   Primary Cardiologist: Dr Sallyanne Kuster  Patient Profile: 74 yo female w/ hx HTN, HL, DM, St Jude PPM for CHB, nl cors '02, nl EF, admitted 04/06 w/ SOB, CHF.  SUBJECTIVE: Breathing better than yesterday, no chest pain  OBJECTIVE Filed Vitals:   12/06/14 1830 12/06/14 1905 12/06/14 2114 12/07/14 0505  BP: 155/55 135/43 163/66 131/65  Pulse: 80 79 69 70  Temp:  97.8 F (36.6 C) 97.2 F (36.2 C) 98.4 F (36.9 C)  TempSrc:  Oral Oral Oral  Resp: 27 24 22 18   Height:  5\' 5"  (1.651 m)    Weight:  209 lb (94.802 kg)  207 lb 6.4 oz (94.076 kg)  SpO2: 95% 96% 97% 94%    Intake/Output Summary (Last 24 hours) at 12/07/14 0932 Last data filed at 12/07/14 0500  Gross per 24 hour  Intake    220 ml  Output   2675 ml  Net  -2455 ml   Filed Weights   12/06/14 1347 12/06/14 1905 12/07/14 0505  Weight: 214 lb 1 oz (97.098 kg) 209 lb (94.802 kg) 207 lb 6.4 oz (94.076 kg)    PHYSICAL EXAM General: Well developed, well nourished, female in no acute distress. Head: Normocephalic, atraumatic.  Neck: Supple without bruits, JVD 9 cm. Lungs:  Resp regular and unlabored, rales bases R>L. Heart: RRR, S1, S2, no S3, S4, or murmur; no rub. Abdomen: Soft, non-tender, non-distended, BS + x 4.  Extremities: No clubbing, cyanosis, No edema.  Neuro: Alert and oriented X 3. Moves all extremities spontaneously. Psych: Normal affect.  LABS: CBC: Recent Labs  12/06/14 1200  WBC 9.3  NEUTROABS 7.4  HGB 12.1  HCT 37.8  MCV 93.1  PLT 671   Basic Metabolic Panel: Recent Labs  12/06/14 1200  NA 140  K 4.2  CL 100  CO2 30    GLUCOSE 137*  BUN 16  CREATININE 0.54  CALCIUM 8.4   Cardiac Enzymes: Recent Labs  12/06/14 1200  TROPONINI 0.03   BNP:  B NATRIURETIC PEPTIDE  Date/Time Value Ref Range Status  12/06/2014 12:00 PM 599.9* 0.0 - 100.0 pg/mL Final    TELE: AV pacing vs V pacing, some ?intrinsic beats    Radiology/Studies: Dg Chest Port 1 View  12/06/2014   CLINICAL DATA:  Shortness of Breath  EXAM: PORTABLE CHEST - 1 VIEW  COMPARISON:  February 27, 2012  FINDINGS: There is underlying emphysematous change. There is cardiomegaly with pulmonary venous hypertension and interstitial edema. There is no airspace consolidation. Pacemaker leads are attached to the right atrium and right ventricle. No adenopathy. There is postoperative change in the lower cervical spine.  IMPRESSION: Congestive heart failure superimposed on emphysematous change. No airspace consolidation.   Electronically Signed   By: Lowella Grip III M.D.   On: 12/06/2014 12:59     Current Medications:  . aspirin EC  81 mg Oral Daily  . atenolol  25 mg Oral Daily  . furosemide  40 mg Intravenous BID  . gabapentin  300 mg Oral TID  . heparin  5,000 Units Subcutaneous 3 times per day  . lisinopril  5 mg Oral Daily  . metFORMIN  500 mg Oral Q supper  . potassium chloride  20 mEq Oral Daily  . pravastatin  40 mg Oral Daily  . sodium chloride  3 mL Intravenous Q12H      ASSESSMENT AND PLAN: Principal Problem:   Accelerated hypertension with diastolic congestive heart failure, NYHA class 3 - Continue Lasix 40 mg IV BID - wt down 7 lb overnight - K+ and renal function stable - echo ordered   Active Problems:   CHB (complete heart block) - PPM    Pacemaker - dual chamber St Jude, 2009 -  Almost 100% V pacing    Essential hypertension - SBP 130s-180s overnight - hopefully will improve w/ diuresis - could increase atenolol to bid for better BP control    Normal coronary arteries angiography 2002 - ez [place], no ischemic sx     Hyperlipidemia - continue statin    Acute congestive heart failure - see above   Jonetta Speak , PA-C 6:21 AM 12/07/2014

## 2014-12-07 NOTE — Care Management Note (Signed)
    Page 1 of 2   12/15/2014     4:00:05 PM CARE MANAGEMENT NOTE 12/15/2014  Patient:  Tammy, Vincent   Account Number:  [id number]  Date Initiated:  12/07/2014  Documentation initiated by:  Xana Bradt  Subjective/Objective Assessment:   Pt adm on 12/06/14 with CHF.  PTA, pt independent, lives alone.  Husband passed away recently.     Action/Plan:   Will follow for dc needs as pt progresses.   Anticipated DC Date:  12/15/2014   Anticipated DC Plan:  [place]  CM consult      [facility] Choice  HOME HEALTH   Choice offered to / List presented to:  C-1 Patient   DME arranged  [facility]      DME agency  [place] arranged  HH-1 RN  HH-10 DISEASE MANAGEMENT  HH-2 PT  HH-3 OT  [place]      [place].   Status of service:  Completed, signed off Medicare Important Message given?  YES (If response is "NO", the following Medicare IM given date fields will be blank) Date Medicare IM given:  12/11/2014 Medicare IM given by:  Abbygael Curtiss Date Additional Medicare IM given:  12/14/2014 Additional Medicare IM given by:  [name]  Discharge Disposition:  [place]  Per UR Regulation:  Reviewed for med. necessity/level of care/duration of stay  If discussed at [place] of Stay Meetings, dates discussed:   12/14/2014    Comments:  12/15/14 [name], RN, BSN [phone] Pt for dc home today with family to assist and [facility] services. Referral to [facility] for [facility] needs.  Pt also needs home oxygen, as cont to desat with activity.  No other DME needs, per pt/daughter.  12/14/14 [name], RN, BSN [phone] Cardiac cath today.  12/11/14 [name], RN, BSN [phone] PT recommending SNF at [place], but pt and daughter adamantly refusing.  Pt's husband recently passed away in SNF, and she feel SNF stay would be traumatic for her.  Daughter, at bedside, states she has to go  back to [facility]. They would like for pt to go to CIR, but currently there are no beds available, and PT is not recommending. Discussed options with pt and daughter--Pt will need HH follow up: Would recommend RN, PT, OT, and bath aide.  She will likely need home O2, as cont to desat with ambulation. Suggested possible private duty aide at home, but daughter stated pt cannot afford this.  I did remind pt that recommended LOC is SNF, and they would have choice of where pt was discharged, but they are adamant that pt will not go to SNF under any circumstances.  Concerned that pt may not be able to handle ADLs at home alone.

## 2014-12-07 NOTE — Progress Notes (Signed)
Responded to spiritual Consult to visit with patient to provide emotional support.  Patient indicate that she was feeling better than yesterday but still not feeling well. Patient requested prayer for pending procedure. 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 Patient loss her husband March 25,2016 on Easter day.  Daughter at bedside.  Patient and daughter would welcome follow up visit. I spent time listening to patient and providing ministry of presence. Will pass on to unit Chaplain for support as needed.   12/07/14 1600  Clinical Encounter Type  Visited With Patient and family together;Health care provider  Visit Type Initial;Spiritual support  Referral From Other (Comment)  Consult/Referral To (Spiritual consult)  Spiritual Encounters  Spiritual Needs Prayer;Emotional  Stress Factors  Patient Stress Factors None identified;Health changes  Family Stress Factors None identified  [name], [place]

## 2014-12-08 DIAGNOSIS — R0600 Dyspnea, unspecified: Secondary | ICD-10-CM

## 2014-12-08 LAB — BASIC METABOLIC PANEL
Anion gap: 7 (ref 5–15)
BUN: 17 mg/dL (ref 6–23)
CO2: 39 mmol/L — ABNORMAL HIGH (ref 19–32)
Calcium: 8.5 mg/dL (ref 8.4–10.5)
Chloride: 94 mmol/L — ABNORMAL LOW (ref 96–112)
Creatinine, Ser: 0.75 mg/dL (ref 0.50–1.10)
GFR calc Af Amer: >90 mL/min (ref >90)
GFR, EST NON AFRICAN AMERICAN: 81 mL/min — AB (ref >90)
GLUCOSE: 118 mg/dL — AB (ref 70–99)
POTASSIUM: 3.5 mmol/L (ref 3.5–5.1)
SODIUM: 140 mmol/L (ref 135–145)

## 2014-12-08 LAB — GLUCOSE, CAPILLARY
GLUCOSE-CAPILLARY: 172 mg/dL — AB (ref 70–99)
GLUCOSE-CAPILLARY: 166 mg/dL — AB (ref 70–99)
GLUCOSE-CAPILLARY: 157 mg/dL — AB (ref 70–99)
Glucose-Capillary: 121 mg/dL — ABNORMAL HIGH (ref 70–99)

## 2014-12-08 LAB — HEMOGLOBIN A1C
HEMOGLOBIN A1C: 7.3 % — AB (ref 4.8–5.6)
Mean Plasma Glucose: 163 mg/dL

## 2014-12-08 MED ORDER — ZOLPIDEM TARTRATE 5 MG PO TABS
5.0000 mg | ORAL_TABLET | Freq: Every evening | ORAL | Status: DC | PRN
  Administered 2014-12-08: 5 mg via ORAL
  Filled 2014-12-08: qty 1

## 2014-12-08 NOTE — Progress Notes (Signed)
SATURATION QUALIFICATIONS: (This note is used to comply with regulatory documentation for home oxygen)  Patient Saturations on Room Air at Rest = 94%  Patient Saturations on Room Air while Ambulating = 85%  Patient Saturations on 2 Liters of oxygen while Ambulating = 93%  Please briefly explain why patient needs home oxygen:pt desaturating on RA with activity [name], [place]

## 2014-12-08 NOTE — Clinical Documentation Improvement (Signed)
Presents with Acute Diastolic CHF, Hypoxia and Hypertension.   Patient with tachypnea - respiratory rates ranging from 20 to 28  Hypoxic with sats 87% on admission  Placed in 2L to 4L FiO2  Please provide a diagnosis associated with the above clinical indicators and treatment provided and document findings in next progress note and include in discharge summary.  Acute Hypoxic Respiratory Failure Chronic hypoxic Respiratory Failure Other Condition Cannot Clinically Determine  Respiratory Failure ruled out  Thank You, Zoila Shutter ,RN Clinical Documentation Specialist:  [place] Information Management

## 2014-12-08 NOTE — Evaluation (Signed)
Physical Therapy Evaluation Patient Details Name: [name] MRN: [id number] DOB: 1940-11-08 Today's Date: 12/08/2014   History of Present Illness  The patient is a 74yo female with a history of HTN, HLD, DM, cervical fusion with incontinence.  She is pacemaker dependent with complete heart block since 2009.Admitted with SOB, leg weakness and fall at MD office  Clinical Impression  Pt pleasant with decreased balance, strength and activity tolerance. Pt's spouse just passed at [place] and children work. Pt does not have 24hr assist for home and currently cannot function at a level to care for herself. Pt with decreased strength, balance and function with current need for supplemental oxygen with activity who will benefit from acute therapy to maximize mobility, function, gait and balance to decrease burden of care prior to D/C.     Follow Up Recommendations SNF;Supervision for mobility/OOB    Equipment Recommendations  None recommended by PT    Recommendations for Other Services OT consult     Precautions / Restrictions Precautions Precautions: Fall Precaution Comments: watch sats      Mobility  Bed Mobility Overal bed mobility: Modified Independent             General bed mobility comments: with heavy use of rail and increased time  Transfers Overall transfer level: Needs assistance   Transfers: Sit to/from Stand Sit to Stand: Supervision         General transfer comment: cues for hand placement and safety  Ambulation/Gait Ambulation/Gait assistance: Min guard Ambulation Distance (Feet): 15 Feet Assistive device: Rolling walker (2 wheeled) Gait Pattern/deviations: Step-through pattern;Decreased stride length   Gait velocity interpretation: Below normal speed for age/gender General Gait Details: decreased speed with seated rest after 6' due to sats dropping to 85% on RA and dizziness, seated rest with return to 2L and sats maintained 93-95% on 2L with 15'  gait. Pt fatigued and unable to ambulate further  Stairs            Wheelchair Mobility    Modified Rankin (Stroke Patients Only)       Balance Overall balance assessment: Needs assistance   Sitting balance-Leahy Scale: Fair       Standing balance-Leahy Scale: Poor                               Pertinent Vitals/Pain Pain Assessment: No/denies pain    Home Living Family/patient expects to be discharged to:: Private residence Living Arrangements: Alone Available Help at Discharge: Family;Available PRN/intermittently Type of Home: House Home Access: Stairs to enter   Entrance Stairs-Number of Steps: 4 Home Layout: One level Home Equipment: Walker - 2 wheels;Wheelchair - manual;Shower seat;Grab bars - tub/shower;Cane - single point;Bedside commode Additional Comments: pt alone just lost spouse 2 weeks ago    Prior Function Level of Independence: Independent with assistive device(s)         Comments: works as a receptionsit in [employer]        Extremity/Trunk Assessment   Upper Extremity Assessment: Generalized weakness           Lower Extremity Assessment: Generalized weakness      Cervical / Trunk Assessment: Kyphotic  Communication   Communication: No difficulties  Cognition Arousal/Alertness: Awake/alert Behavior During Therapy: WFL for tasks assessed/performed Overall Cognitive Status: Within Functional Limits for tasks assessed  General Comments      Exercises        Assessment/Plan    PT Assessment Patient needs continued PT services  PT Diagnosis Difficulty walking;Generalized weakness   PT Problem List Decreased strength;Decreased activity tolerance;Decreased balance;Decreased mobility;Decreased knowledge of precautions;Decreased knowledge of use of DME;Cardiopulmonary status limiting activity  PT Treatment Interventions Gait training;DME instruction;Functional  mobility training;Therapeutic activities;Therapeutic exercise;Patient/family education   PT Goals (Current goals can be found in the Care Plan section) Acute Rehab PT Goals Patient Stated Goal: return home PT Goal Formulation: With patient Time For Goal Achievement: 12/22/14 Potential to Achieve Goals: Good    Frequency Min 3X/week   Barriers to discharge Decreased caregiver support      Co-evaluation               End of Session Equipment Utilized During Treatment: Oxygen Activity Tolerance: Patient tolerated treatment well Patient left: in chair;with call bell/phone within reach;with chair alarm set Nurse Communication: Mobility status;Precautions         Time: 7425-9563 PT Time Calculation (min) (ACUTE ONLY): 34 min   Charges:   PT Evaluation $Initial PT Evaluation Tier I: 1 Procedure PT Treatments $Therapeutic Activity: 8-22 mins   PT G CodesMelford Aase 12/08/2014, 1:15 PM Elwyn Reach, [place]

## 2014-12-08 NOTE — Progress Notes (Signed)
  Echocardiogram 2D Echocardiogram has been performed.  [name] 12/08/2014, 8:53 AM

## 2014-12-08 NOTE — Progress Notes (Signed)
Heart Failure Navigator Consult Note  Presentation: [name] is a 74yo female with a history of HTN, HLD, DM, She is pacemaker dependent with complete heart block since 2009. Comprehensive check of her pacemaker on 05/02/14 showed normal function and no evidence of significant arrhythmia. Her device is a Radio broadcast assistant that is not capable of remote monitoring. Battery longevity is estimated at 4-5 years. Roughly 70% atrial pacing and 100% ventricular pacing. No meaningful tachyarrhythmia was recorded. She had normal coronary arteries by angiography a long time ago in 2002. She does not have problems withvalvular abnormalities and has normal left ventricular systolic function by echo 2009. The patient reports her husband passed away about two weeks ago after having PNA. She's been getting progressively SOB over the last two weeks with orthopnea, PND and swelling in her abdomen. Some coughing which is worse when laying down. Very little LEE. The patient currently denies nausea, vomiting, fever, chest pain, congestion, abdominal pain, hematochezia, melena, claudication. I think she's had a lot of meals prepared for her lately. She was at her PCP's office today and then transferred via EMS to the ER. She felt weak in the legs when there and almost fell. Urine output has not changed.  Past Medical History  Diagnosis Date  . Hypertension   . Hypercholesterolemia   . Pacemaker   . Dysrhythmia   . Neuropathy of both feet   . Shortness of breath     exertional  . Venous insufficiency (chronic) (peripheral)   . CHB (complete heart block) 09/19/2013  . HTN (hypertension) 09/19/2013  . Hyperlipidemia 09/19/2013  . Paralysis of right hand 2007    "after spinal cord injury/OR"  . CHF (congestive heart failure) dx'd 12/06/2014  . Coronary artery disease     normal coronaries by 09/10/00 cath  . Myocardial infarction dx'd 2009  . Diabetes mellitus     hga1c 7.1 no meds  . Type II diabetes  mellitus     "had it years ago; lost weight after neck OR in 2007; glucose went way down; recently dx'd as returned recently" (12/07/2014)  . Arthritis     "hands, spine" (12/07/2014)  . Chronic pain     "since neck OR in 2007"  . Urinary incontinence     "since 2007's OR"    History   Social History  . Marital Status: Widowed    Spouse Name: N/A  . Number of Children: N/A  . Years of Education: N/A   Social History Main Topics  . Smoking status: Former Smoker -- 1.00 packs/day for 40 years    Types: Cigarettes    Quit date: 03/02/1992  . Smokeless tobacco: Never Used  . Alcohol Use: No  . Drug Use: No  . Sexual Activity: No   Other Topics Concern  . None   Social History Narrative    ECHO:Study Conclusions--12/08/14  - Left ventricle: The cavity size was normal. Wall thickness was increased in a pattern of mild LVH. Systolic function was normal. The estimated ejection fraction was in the range of 50% to 55%. Wall motion was normal; there were no regional wall motion abnormalities. The study is not technically sufficient to allow evaluation of LV diastolic function. - Ventricular septum: Septal motion showed abnormal function and dyssynergy. - Aortic valve: There was trivial regurgitation. - Mitral valve: Calcified annulus. There was mild regurgitation. - Left atrium: The atrium was mildly dilated.  Impressions:  - Normal to mild global reduction in LV function (EF  50); mild LAE; mild MR; trace TR.  BNP    Component Value Date/Time   BNP 599.9* 12/06/2014 1200    ProBNP    Component Value Date/Time   PROBNP 1068.0* 11/29/2007 1801     Education Assessment and Provision:  Detailed education and instructions provided on heart failure disease management including the following:  Signs and symptoms of Heart Failure When to call the physician Importance of daily weights Low sodium diet Fluid restriction Medication management Anticipated  future follow-up appointments  Patient education given on each of the above topics.  Patient acknowledges understanding and acceptance of all instructions.  I spoke at length with patient and daughter.  Her husband recently died after an extended illness on March 25th.  She admits that she has not been taking care herself lately.  She does not have a scale and does not weigh daily--however says that she can get one.  We discussed the importance of daily weights as a tool for symptom recognition.  We reviewed a low sodium diet and high sodium foods to avoid.  She denies any issues with getting or taking medications.  Her daughter will be local to assist her with any issues.    Education Materials:  "Living Better With Heart Failure" Booklet, Daily Weight Tracker Tool    High Risk Criteria for Readmission and/or Poor Patient Outcomes:   EF <30%- yes 50-55%  2 or more admissions in 6 months- No  Difficult social situation-No--lives alone  Demonstrates medication noncompliance- No  Barriers of Care:  Knowledge and compliance  Discharge Planning:  Plans to return to home alone if able.   She would benefit from [facility] for ongoing HF education and compliance reinforcement.

## 2014-12-08 NOTE — Progress Notes (Signed)
Patient Profile: 74 yo female w/ hx HTN, HL, DM, St Jude PPM for CHB, nl cors '02, nl EF, admitted 04/06 w/ SOB, CHF.  Subjective: No complaints. Breathing has improved. No CP. Still with mild DOE.  Objective: Vital signs in last 24 hours: Temp:  [97.6 F (36.4 C)-98.3 F (36.8 C)] 98.3 F (36.8 C) (04/08 0900) Pulse Rate:  [69-71] 71 (04/08 0900) Resp:  [17-20] 20 (04/08 0900) BP: (114-137)/(45-53) 126/45 mmHg (04/08 0900) SpO2:  [95 %-99 %] 95 % (04/08 0900) Weight:  [204 lb 3.2 oz (92.625 kg)] 204 lb 3.2 oz (92.625 kg) (04/08 0557) Last BM Date: 12/08/14  Intake/Output from previous day: 04/07 0701 - 04/08 0700 In: 1623 [P.O.:1620; I.V.:3] Out: 3100 [Urine:3100] Intake/Output this shift: Total I/O In: 600 [P.O.:600] Out: -   Medications Current Facility-Administered Medications  Medication Dose Route Frequency Provider Last Rate Last Dose  . 0.9 %  sodium chloride infusion  250 mL Intravenous PRN Brett Canales, PA-C      . acetaminophen (TYLENOL) tablet 650 mg  650 mg Oral Q4H PRN Brett Canales, PA-C   650 mg at 12/07/14 2147  . aspirin EC tablet 81 mg  81 mg Oral Daily Brett Canales, PA-C   81 mg at 12/08/14 1057  . atenolol (TENORMIN) tablet 25 mg  25 mg Oral Daily Brett Canales, PA-C   25 mg at 12/08/14 1057  . furosemide (LASIX) injection 40 mg  40 mg Intravenous BID Brett Canales, PA-C   40 mg at 12/08/14 1048  . gabapentin (NEURONTIN) capsule 300 mg  300 mg Oral TID Brett Canales, PA-C   300 mg at 12/08/14 1057  . heparin injection 5,000 Units  5,000 Units Subcutaneous 3 times per day Brett Canales, PA-C   5,000 Units at 12/08/14 [phone]  . insulin aspart (novoLOG) injection 0-15 Units  0-15 Units Subcutaneous TID WC Rhonda G Barrett, PA-C   3 Units at 12/08/14 1149  . insulin aspart (novoLOG) injection 0-5 Units  0-5 Units Subcutaneous QHS Evelene Croon Barrett, PA-C   0 Units at 12/07/14 2144  . lisinopril (PRINIVIL,ZESTRIL) tablet 5 mg  5 mg Oral Daily Brett Canales,  PA-C   5 mg at 12/08/14 1057  . metFORMIN (GLUCOPHAGE-XR) 24 hr tablet 500 mg  500 mg Oral Q supper Brett Canales, PA-C   500 mg at 12/07/14 1725  . ondansetron (ZOFRAN) injection 4 mg  4 mg Intravenous Q6H PRN Brett Canales, PA-C      . potassium chloride SA (K-DUR,KLOR-CON) CR tablet 20 mEq  20 mEq Oral Daily Brett Canales, PA-C   20 mEq at 12/08/14 1057  . pravastatin (PRAVACHOL) tablet 40 mg  40 mg Oral Daily Brett Canales, PA-C   40 mg at 12/08/14 1057  . sodium chloride 0.9 % injection 3 mL  3 mL Intravenous Q12H Brett Canales, PA-C   3 mL at 12/08/14 1058  . sodium chloride 0.9 % injection 3 mL  3 mL Intravenous PRN Brett Canales, PA-C        PE: General appearance: alert, cooperative and no distress Neck: no carotid bruit and no JVD Lungs: clear to auscultation bilaterally Heart: regular rate and rhythm Extremities: no LEE Pulses: 2+ and symmetric Skin: warm and dry Neurologic: Grossly normal   Filed Weights   12/06/14 1905 12/07/14 0505 12/08/14 0557  Weight: 209 lb (94.802 kg) 207 lb 6.4 oz (94.076 kg) 204 lb 3.2 oz (  92.625 kg)   Lab Results:   Recent Labs  12/06/14 1200  WBC 9.3  HGB 12.1  HCT 37.8  PLT 196   BMET  Recent Labs  12/06/14 1200 12/07/14 0525 12/08/14 0247  NA 140 139 140  K 4.2 3.8 3.5  CL 100 95* 94*  CO2 30 35* 39*  GLUCOSE 137* 114* 118*  BUN 16 12 17   CREATININE 0.54 0.56 0.75  CALCIUM 8.4 8.3* 8.5    Studies/Results: 2D echo 12/07/14  Study Conclusions  - Left ventricle: The cavity size was normal. Wall thickness was increased in a pattern of mild LVH. Systolic function was normal. The estimated ejection fraction was in the range of 50% to 55%. Wall motion was normal; there were no regional wall motion abnormalities. The study is not technically sufficient to allow evaluation of LV diastolic function. - Ventricular septum: Septal motion showed abnormal function and dyssynergy. - Aortic valve: There was trivial  regurgitation. - Mitral valve: Calcified annulus. There was mild regurgitation. - Left atrium: The atrium was mildly dilated.  Impressions:  - Normal to mild global reduction in LV function (EF 50); mild LAE; mild MR; trace TR.  Assessment/Plan  Principal Problem:   Accelerated hypertension with diastolic congestive heart failure, NYHA class 3 Active Problems:   CHB (complete heart block)   Pacemaker - dual chamber St Jude, 2009   Essential hypertension   Normal coronary arteries angiography 2002   Hyperlipidemia   Acute congestive heart failure    1. Acute Diastolic CHF: EF normal on echo at 50-55%. BNP 599. Diuresing well. -3L out in past 24 hrs. I/Os net negative 3.3L.  Weight is down 3 lb since yesterday. Still with mild DOE. Now on 1L [place]. O2 sats at 94 %. Continue IV Lasix today and likely transition to PO in the am. Continue BP control and low sodium diet.   2. HTN: well controlled. Continue atenolol, lasix and lisinopril.      LOS: 2 days    Brittainy M. Rosita Fire, PA-C 12/08/2014 11:51 AM

## 2014-12-09 ENCOUNTER — Inpatient Hospital Stay (HOSPITAL_COMMUNITY): Payer: Medicare Other

## 2014-12-09 LAB — BASIC METABOLIC PANEL
ANION GAP: 14 (ref 5–15)
BUN: 20 mg/dL (ref 6–23)
CHLORIDE: 94 mmol/L — AB (ref 96–112)
CO2: 30 mmol/L (ref 19–32)
Calcium: 8.7 mg/dL (ref 8.4–10.5)
Creatinine, Ser: 0.66 mg/dL (ref 0.50–1.10)
GFR calc non Af Amer: 84 mL/min — ABNORMAL LOW (ref >90)
Glucose, Bld: 119 mg/dL — ABNORMAL HIGH (ref 70–99)
Potassium: 4.1 mmol/L (ref 3.5–5.1)
Sodium: 138 mmol/L (ref 135–145)

## 2014-12-09 LAB — GLUCOSE, CAPILLARY
GLUCOSE-CAPILLARY: 151 mg/dL — AB (ref 70–99)
GLUCOSE-CAPILLARY: 146 mg/dL — AB (ref 70–99)
Glucose-Capillary: 127 mg/dL — ABNORMAL HIGH (ref 70–99)
Glucose-Capillary: 150 mg/dL — ABNORMAL HIGH (ref 70–99)

## 2014-12-09 LAB — BRAIN NATRIURETIC PEPTIDE: B NATRIURETIC PEPTIDE 5: 36.1 pg/mL (ref 0.0–100.0)

## 2014-12-09 MED ORDER — FUROSEMIDE 40 MG PO TABS
40.0000 mg | ORAL_TABLET | Freq: Every day | ORAL | Status: DC
  Administered 2014-12-10 – 2014-12-11 (×2): 40 mg via ORAL
  Filled 2014-12-09 (×4): qty 1

## 2014-12-09 NOTE — Progress Notes (Signed)
Patient Name: [name] Date of Encounter: 12/09/2014     Principal Problem:   Accelerated hypertension with diastolic congestive heart failure, NYHA class 3 Active Problems:   CHB (complete heart block)   Pacemaker - dual chamber St Jude, 2009   Essential hypertension   Normal coronary arteries angiography 2002   Hyperlipidemia   Acute congestive heart failure    SUBJECTIVE  No chest pain. Her dyspnea is improving. BNP has dropped from 599 to 36. Rhythm is atrial sensed ventricular paced.  CURRENT MEDS . aspirin EC  81 mg Oral Daily  . atenolol  25 mg Oral Daily  . furosemide  40 mg Intravenous BID  . gabapentin  300 mg Oral TID  . heparin  5,000 Units Subcutaneous 3 times per day  . insulin aspart  0-15 Units Subcutaneous TID WC  . insulin aspart  0-5 Units Subcutaneous QHS  . lisinopril  5 mg Oral Daily  . metFORMIN  500 mg Oral Q supper  . potassium chloride  20 mEq Oral Daily  . pravastatin  40 mg Oral Daily  . sodium chloride  3 mL Intravenous Q12H    OBJECTIVE  Filed Vitals:   12/08/14 1307 12/08/14 1417 12/09/14 0616 12/09/14 1051  BP:  117/48 136/58 139/56  Pulse: 70 70 75 75  Temp:  98.3 F (36.8 C) 98.3 F (36.8 C)   TempSrc:  Oral Oral   Resp:   18   Height:      Weight:   206 lb 4.8 oz (93.577 kg)   SpO2: 94% 94% 96% 96%    Intake/Output Summary (Last 24 hours) at 12/09/14 1203 Last data filed at 12/09/14 1055  Gross per 24 hour  Intake   1083 ml  Output    901 ml  Net    182 ml   Filed Weights   12/07/14 0505 12/08/14 0557 12/09/14 0616  Weight: 207 lb 6.4 oz (94.076 kg) 204 lb 3.2 oz (92.625 kg) 206 lb 4.8 oz (93.577 kg)    PHYSICAL EXAM  General: Pleasant, NAD. Neuro: Alert and oriented X 3. Moves all extremities spontaneously. Psych: Normal affect. HEENT:  Normal  Neck: Supple without bruits or JVD. Lungs:  Resp regular and unlabored, CTA. Heart: RRR no s3, s4, or murmurs. Abdomen: Soft, non-tender, non-distended, BS + x  4.  Extremities: No clubbing, cyanosis or edema. DP/PT/Radials 2+ and equal bilaterally.  Accessory Clinical Findings  CBC No results for input(s): WBC, NEUTROABS, HGB, HCT, MCV, PLT in the last 72 hours. Basic Metabolic Panel  Recent Labs  12/08/14 0247 12/09/14 0408  NA 140 138  K 3.5 4.1  CL 94* 94*  CO2 39* 30  GLUCOSE 118* 119*  BUN 17 20  CREATININE 0.75 0.66  CALCIUM 8.5 8.7   Liver Function Tests No results for input(s): AST, ALT, ALKPHOS, BILITOT, PROT, ALBUMIN in the last 72 hours. No results for input(s): LIPASE, AMYLASE in the last 72 hours. Cardiac Enzymes No results for input(s): CKTOTAL, CKMB, CKMBINDEX, TROPONINI in the last 72 hours. BNP Invalid input(s): POCBNP D-Dimer No results for input(s): DDIMER in the last 72 hours. Hemoglobin A1C  Recent Labs  12/07/14 1415  HGBA1C 7.3*   Fasting Lipid Panel No results for input(s): CHOL, HDL, LDLCALC, TRIG, CHOLHDL, LDLDIRECT in the last 72 hours. Thyroid Function Tests No results for input(s): TSH, T4TOTAL, T3FREE, THYROIDAB in the last 72 hours.  Invalid input(s): FREET3  TELE  AS-VP    Radiology/Studies  Dg Chest Port 1 View  12/06/2014   CLINICAL DATA:  Shortness of Breath  EXAM: PORTABLE CHEST - 1 VIEW  COMPARISON:  February 27, 2012  FINDINGS: There is underlying emphysematous change. There is cardiomegaly with pulmonary venous hypertension and interstitial edema. There is no airspace consolidation. Pacemaker leads are attached to the right atrium and right ventricle. No adenopathy. There is postoperative change in the lower cervical spine.  IMPRESSION: Congestive heart failure superimposed on emphysematous change. No airspace consolidation.   Electronically Signed   By: Lowella Grip III M.D.   On: 12/06/2014 12:59    ASSESSMENT AND PLAN   1. Acute Diastolic CHF: EF normal on echo at 50-55%. BNP 599 on admission, now normal.  2. HTN: well controlled. Continue atenolol, lasix and lisinopril.    Switch to oral lasix today. Get PA and Lat chest xray today.    Signed, Darlin Coco MD

## 2014-12-09 NOTE — Progress Notes (Signed)
Patient alert oriented, denies pain , mild shortness of breath with activities, patient 2L O2. V/S stable, AV pace on the monitor. Will continue to monitor.

## 2014-12-10 LAB — GLUCOSE, CAPILLARY
Glucose-Capillary: 113 mg/dL — ABNORMAL HIGH (ref 70–99)
Glucose-Capillary: 174 mg/dL — ABNORMAL HIGH (ref 70–99)
Glucose-Capillary: 156 mg/dL — ABNORMAL HIGH (ref 70–99)

## 2014-12-10 NOTE — Progress Notes (Signed)
Patient Name: [name] Date of Encounter: 12/10/2014     Principal Problem:   Accelerated hypertension with diastolic congestive heart failure, NYHA class 3 Active Problems:   CHB (complete heart block)   Pacemaker - dual chamber St Jude, 2009   Essential hypertension   Normal coronary arteries angiography 2002   Hyperlipidemia   Acute congestive heart failure    SUBJECTIVE  The patient is still quite weak when she tries to ambulate.  Her oxygen level drops with ambulation and she will need home oxygen when she leaves the hospital.  Discharge plans are still in flux.  Patient and family would like her to be considered for [facility].  She does not want to go to a nursing home.  We will ask for a [facility] consultation.  CURRENT MEDS . aspirin EC  81 mg Oral Daily  . atenolol  25 mg Oral Daily  . furosemide  40 mg Oral Daily  . gabapentin  300 mg Oral TID  . heparin  5,000 Units Subcutaneous 3 times per day  . insulin aspart  0-15 Units Subcutaneous TID WC  . insulin aspart  0-5 Units Subcutaneous QHS  . lisinopril  5 mg Oral Daily  . metFORMIN  500 mg Oral Q supper  . potassium chloride  20 mEq Oral Daily  . pravastatin  40 mg Oral Daily  . sodium chloride  3 mL Intravenous Q12H    OBJECTIVE  Filed Vitals:   12/09/14 1515 12/09/14 2158 12/10/14 0557 12/10/14 0900  BP: 115/44 138/68 124/50 121/57  Pulse: 70 69 70 70  Temp: 98.7 F (37.1 C) 98.2 F (36.8 C) 97.6 F (36.4 C)   TempSrc: Oral Oral Oral   Resp: 18 18 18    Height:      Weight:   205 lb 9.6 oz (93.26 kg)   SpO2: 96% 97% 98%     Intake/Output Summary (Last 24 hours) at 12/10/14 1213 Last data filed at 12/10/14 1037  Gross per 24 hour  Intake   1360 ml  Output    951 ml  Net    409 ml   Filed Weights   12/08/14 0557 12/09/14 0616 12/10/14 0557  Weight: 204 lb 3.2 oz (92.625 kg) 206 lb 4.8 oz (93.577 kg) 205 lb 9.6 oz (93.26 kg)    PHYSICAL  EXAM  General: Pleasant, NAD. Neuro: Alert and oriented X 3. Moves all extremities spontaneously. Psych: Normal affect. HEENT:  Normal  Neck: Supple without bruits or JVD. Lungs:  Resp regular and unlabored, CTA. Heart: RRR no s3, s4, or murmurs. Abdomen: Soft, non-tender, non-distended, BS + x 4.  Extremities: No clubbing, cyanosis or edema. DP/PT/Radials 2+ and equal bilaterally.  Accessory Clinical Findings  CBC No results for input(s): WBC, NEUTROABS, HGB, HCT, MCV, PLT in the last 72 hours. Basic Metabolic Panel  Recent Labs  12/08/14 0247 12/09/14 0408  NA 140 138  K 3.5 4.1  CL 94* 94*  CO2 39* 30  GLUCOSE 118* 119*  BUN 17 20  CREATININE 0.75 0.66  CALCIUM 8.5 8.7   Liver Function Tests No results for input(s): AST, ALT, ALKPHOS, BILITOT, PROT, ALBUMIN in the last 72 hours. No results for input(s): LIPASE, AMYLASE in the last 72 hours. Cardiac Enzymes No results for input(s): CKTOTAL, CKMB, CKMBINDEX, TROPONINI in the last 72 hours. BNP Invalid input(s): POCBNP D-Dimer No results for input(s): DDIMER in the last 72 hours. Hemoglobin A1C  Recent Labs  12/07/14 1415  HGBA1C 7.3*   Fasting Lipid Panel No results for input(s): CHOL, HDL, LDLCALC, TRIG, CHOLHDL, LDLDIRECT in the last 72 hours. Thyroid Function Tests No results for input(s): TSH, T4TOTAL, T3FREE, THYROIDAB in the last 72 hours.  Invalid input(s): FREET3  TELE  AS-VP  ECG    Radiology/Studies  Dg Chest 2 View  12/09/2014   CLINICAL DATA:  Dyspnea  EXAM: CHEST  2 VIEW  COMPARISON:  12/06/2014  FINDINGS: Cardiomegaly again noted. Central mild vascular congestion without pulmonary edema. There is improvement in aeration. No segmental infiltrate. Dual lead cardiac pacemaker is unchanged in position.  IMPRESSION: Improvement in aeration. Cardiomegaly again noted. Central mild vascular contrast without pulmonary edema. No segmental infiltrate. Stable dual lead cardiac pacemaker position.    Electronically Signed   By: Lahoma Crocker M.D.   On: 12/09/2014 13:05   Dg Chest Port 1 View  12/06/2014   CLINICAL DATA:  Shortness of Breath  EXAM: PORTABLE CHEST - 1 VIEW  COMPARISON:  February 27, 2012  FINDINGS: There is underlying emphysematous change. There is cardiomegaly with pulmonary venous hypertension and interstitial edema. There is no airspace consolidation. Pacemaker leads are attached to the right atrium and right ventricle. No adenopathy. There is postoperative change in the lower cervical spine.  IMPRESSION: Congestive heart failure superimposed on emphysematous change. No airspace consolidation.   Electronically Signed   By: Lowella Grip III M.D.   On: 12/06/2014 12:59    ASSESSMENT AND PLAN 1. Acute Diastolic CHF: EF normal on echo at 50-55%. BNP 599 on admission, now normal.  2. HTN: well controlled. Continue atenolol, lasix and lisinopril.   3.  COPD with exertional hypoxemia  4.  Deconditioning  Plan: We will request CIR consult.  Family understands that this may not occur until tomorrow. Signed, Darlin Coco MD

## 2014-12-10 NOTE — Clinical Social Work Placement (Signed)
Clinical Social Work Department CLINICAL SOCIAL WORK PLACEMENT NOTE 12/10/2014  Patient:  [name], [name]  Account Number:  [id number] Admit date:  12/06/2014  Clinical Social Worker:  Lovey Newcomer  Date/time:  12/10/2014 11:22 AM  Clinical Social Work is seeking post-discharge placement for this patient at the following level of care:   SKILLED NURSING   (*CSW will update this form in Epic as items are completed)   12/10/2014  Patient/family provided with [facility] Department of Clinical Social Work's list of facilities offering this level of care within the geographic area requested by the patient (or if unable, by the patient's family).  12/10/2014  Patient/family informed of their freedom to choose among providers that offer the needed level of care, that participate in Medicare, Medicaid or managed care program needed by the patient, have an available bed and are willing to accept the patient.  12/10/2014  Patient/family informed of [facility]' ownership interest in [facility], as well as of the fact that they are under no obligation to receive care at this facility.  PASARR submitted to EDS on 12/10/2014 PASARR number received on 12/10/2014  FL2 transmitted to all facilities in geographic area requested by pt/family on  12/10/2014 FL2 transmitted to all facilities within larger geographic area on   Patient informed that his/her managed care company has contracts with or will negotiate with  certain facilities, including the following:     Patient/family informed of bed offers received:   Patient chooses bed at  Physician recommends and patient chooses bed at    Patient to be transferred to  on   Patient to be transferred to facility by  Patient and family notified of transfer on  Name of family member notified:    The following physician request were entered in Epic:   Additional Comments:    Liz Beach MSW, [place], [place],  [phone]

## 2014-12-10 NOTE — Clinical Social Work Psychosocial (Signed)
Clinical Social Work Department BRIEF PSYCHOSOCIAL ASSESSMENT 12/10/2014  Patient:  [name], [name]     Account Number:  [id number]     Admit date:  12/06/2014  Clinical Social Worker:  Lovey Newcomer  Date/Time:  12/10/2014 10:30 AM  Referred by:  Physician  Date Referred:  12/10/2014 Referred for  SNF Placement   Other Referral:   NA   Interview type:  Patient Other interview type:   Patient alert and oriented at time of assessment.    PSYCHOSOCIAL DATA Living Status:  ALONE Admitted from facility:   Level of care:   Primary support name:  [name] and [name] Primary support relationship to patient:  CHILD, ADULT Degree of support available:   Support is strong.    CURRENT CONCERNS Current Concerns  Post-Acute Placement   Other Concerns:   NA    SOCIAL WORK ASSESSMENT / PLAN CSW met with patient and family (daughter [name] and brother [name]) at bedside to complete assessment. Patient appears to be irritated by CSW's presence and the topic of discussion (SNF). Patient and patient's daughter [name] both voice frustration about "not being seen by PT." this weekend. [name] states, "I don't even know what she can do, and I'm not making a decision on where she goes until I know that." CSW provided emotional support to patient and daughter and stated that charge RN would be asked to see if PT could see patient today. RN will assist with this.    CSW was able to steer conversation back to current recommendation for SNF. Patient and daughter state they want the patient to go to CIR. CSW explained that this was not recommended by PT, and would like not be an option. Patient states, "I will only go to CIR or home." Patient is very concerned about whether or not her insurance will cover rehab. CSW explained the need to consider SNF placement if CIR and home are not options. Patient and daughter agreeable to fax out only to [facility]. Both understand that if CIR and  [facility] are not options for DC, the patient will be discharged home.    As CSW left the room, daughter stopped CSW in the hallway. Daughter apologized for both her and her mom's abrupt tone with CSW. Daughter shares that the patient's husband recently passed away in a SNF where he was neglected and this is why the patient and daughter have such negative emotions related to SNF. CSW validated daughter's feelings and assured her that her requested would be relayed to the charge RN. At this time the family plans for the patient to go to CIR or Home. SNF is the LAST resort, per patient and family.   Assessment/plan status:  Psychosocial Support/Ongoing Assessment of Needs Other assessment/ plan:   Complete Fl2, Fax, PASRR   Information/referral to community resources:   CSW contact information and SNF list given.    PATIENT'S/FAMILY'S RESPONSE TO PLAN OF CARE: Patient and patient's daughter are against SNF placement unless this is the only option. Both are requesting admission to CIR. CSW unable to tell family whether or not patient is a candidate. Patient and daughter plan for DC to CIR or Home, they are agreeable to [facility] only if SNF is absolutely required.       Liz Beach MSW, [place], [place], [phone]

## 2014-12-10 NOTE — Progress Notes (Signed)
SATURATION QUALIFICATIONS: (This note is used to comply with regulatory documentation for home oxygen)  Patient Saturations on Room Air at Rest =91%  Patient Saturations on Room Air while Ambulating = 86%  Patient Saturations on 2 Liters of oxygen while Ambulating =91%  Please briefly explain why patient needs home oxygen:Patient Walk 36 ft from room to the hall way with walker, patient sat down x2 due to weakness and  shortness of breath with 2L of oxygen.

## 2014-12-11 ENCOUNTER — Encounter (HOSPITAL_COMMUNITY): Payer: Self-pay

## 2014-12-11 ENCOUNTER — Inpatient Hospital Stay (HOSPITAL_COMMUNITY): Payer: Medicare Other

## 2014-12-11 DIAGNOSIS — R5381 Other malaise: Secondary | ICD-10-CM

## 2014-12-11 DIAGNOSIS — I509 Heart failure, unspecified: Secondary | ICD-10-CM

## 2014-12-11 DIAGNOSIS — J810 Acute pulmonary edema: Secondary | ICD-10-CM | POA: Insufficient documentation

## 2014-12-11 DIAGNOSIS — R0609 Other forms of dyspnea: Secondary | ICD-10-CM | POA: Insufficient documentation

## 2014-12-11 DIAGNOSIS — G4730 Sleep apnea, unspecified: Secondary | ICD-10-CM

## 2014-12-11 DIAGNOSIS — J9601 Acute respiratory failure with hypoxia: Secondary | ICD-10-CM | POA: Insufficient documentation

## 2014-12-11 DIAGNOSIS — R0902 Hypoxemia: Secondary | ICD-10-CM | POA: Insufficient documentation

## 2014-12-11 DIAGNOSIS — I5031 Acute diastolic (congestive) heart failure: Principal | ICD-10-CM

## 2014-12-11 DIAGNOSIS — R0602 Shortness of breath: Secondary | ICD-10-CM

## 2014-12-11 DIAGNOSIS — R0600 Dyspnea, unspecified: Secondary | ICD-10-CM | POA: Insufficient documentation

## 2014-12-11 LAB — CBC
HEMATOCRIT: 39.1 % (ref 36.0–46.0)
HEMOGLOBIN: 12.5 g/dL (ref 12.0–15.0)
MCH: 29.8 pg (ref 26.0–34.0)
MCHC: 32 g/dL (ref 30.0–36.0)
MCV: 93.1 fL (ref 78.0–100.0)
Platelets: 226 10*3/uL (ref 150–400)
RBC: 4.2 MIL/uL (ref 3.87–5.11)
RDW: 13.8 % (ref 11.5–15.5)
WBC: 7.5 10*3/uL (ref 4.0–10.5)

## 2014-12-11 LAB — GLUCOSE, CAPILLARY
GLUCOSE-CAPILLARY: 124 mg/dL — AB (ref 70–99)
GLUCOSE-CAPILLARY: 122 mg/dL — AB (ref 70–99)
GLUCOSE-CAPILLARY: 149 mg/dL — AB (ref 70–99)
GLUCOSE-CAPILLARY: 122 mg/dL — AB (ref 70–99)

## 2014-12-11 LAB — BASIC METABOLIC PANEL
Anion gap: 9 (ref 5–15)
BUN: 20 mg/dL (ref 6–23)
CO2: 36 mmol/L — AB (ref 19–32)
Calcium: 8.8 mg/dL (ref 8.4–10.5)
Chloride: 91 mmol/L — ABNORMAL LOW (ref 96–112)
Creatinine, Ser: 0.98 mg/dL (ref 0.50–1.10)
GFR, EST AFRICAN AMERICAN: 64 mL/min — AB (ref >90)
GFR, EST NON AFRICAN AMERICAN: 55 mL/min — AB (ref >90)
Glucose, Bld: 170 mg/dL — ABNORMAL HIGH (ref 70–99)
Potassium: 4.6 mmol/L (ref 3.5–5.1)
SODIUM: 136 mmol/L (ref 135–145)

## 2014-12-11 MED ORDER — IOHEXOL 350 MG/ML SOLN
80.0000 mL | Freq: Once | INTRAVENOUS | Status: AC | PRN
  Administered 2014-12-11: 80 mL via INTRAVENOUS

## 2014-12-11 NOTE — Consult Note (Signed)
Physical Medicine and Rehabilitation Consult Reason for Consult: Debilitation/acute diastolic congestive heart failure Referring Physician: Dr.[name]    HPI: [name] is a 74 y.o.right handed  female with history of hypertension, diabetes mellitus peripheral neuropathy, cervical fusion 2007, complete heart block with pacemaker(dual-chamber St. Jude) 2009. Presented 12/06/2014 with increasing shortness of breath 2 weeks. Patient recently widowed after losing her husband at [place]. Chest x-ray shows congestive heart failure superimposed on emphysematous changes. BNP 599. Echocardiogram with ejection fraction of 55% no wall motion abnormalities. Systolic function was normal. Maintain on IV Lasix for diuresis. Dyspnea has continued to improve Lasix was changed to oral 12/09/2014. Subcutaneous heparin for DVT prophylaxis. Physical therapy evaluation completed. Patient and family have requested CIR consult.   Review of Systems  Respiratory: Positive for shortness of breath.   Cardiovascular: Positive for leg swelling.  Genitourinary:       Occasional urinary incontinence  Musculoskeletal: Positive for back pain and neck pain.  All other systems reviewed and are negative.  Past Medical History  Diagnosis Date  . Hypertension   . Hypercholesterolemia   . Pacemaker   . Dysrhythmia   . Neuropathy of both feet   . Shortness of breath     exertional  . Venous insufficiency (chronic) (peripheral)   . CHB (complete heart block) 09/19/2013  . HTN (hypertension) 09/19/2013  . Hyperlipidemia 09/19/2013  . Paralysis of right hand 2007    "after spinal cord injury/OR"  . CHF (congestive heart failure) dx'd 12/06/2014  . Coronary artery disease     normal coronaries by 09/10/00 cath  . Myocardial infarction dx'd 2009  . Diabetes mellitus     hga1c 7.1 no meds  . Type II diabetes mellitus     "had it years ago; lost weight after neck OR in 2007; glucose went way down; recently dx'd as  returned recently" (12/07/2014)  . Arthritis     "hands, spine" (12/07/2014)  . Chronic pain     "since neck OR in 2007"  . Urinary incontinence     "since 2007's OR"   Past Surgical History  Procedure Laterality Date  . Cardiac pacemaker placement  12/01/2007    St.Jude Zephyr XLDR #[id number], dual chamber  . Anterior cervical decomp/discectomy fusion  2007  . Lumbar laminectomy/decompression microdiscectomy  03/02/2012    Procedure: LUMBAR LAMINECTOMY/DECOMPRESSION MICRODISCECTOMY 2 LEVELS;  Surgeon: Floyce Stakes, MD;  Location: [facility];  Service: Neurosurgery;  Laterality: Bilateral;  Lumbar three, lumbar four, lumbar five Laminectomy/Foraminotomy  . Transthoracic echocardiogram  11/30/2007    normal echo, mean transaortic valve gradient 76mmHg  . Nm myocar perf wall motion  10/24/2008    protocol:Lexiscan, EF63%, normal study, scan negative for ischemia  . Cardiovascular stress test  08/28/2000    Cardiolite perfusion study, EF58%, low risk study,   . Lower ext. dopplers  06/12/2011    no evidence of thrombus, all vessels normal in size, no evidence of insuffiency  . Back surgery    . Laparoscopic cholecystectomy  1995  . Knee arthroscopy Right   . Dilation and curettage of uterus  1960's  . Cardiac catheterization  09/10/2000    normal Coronary arteries, EF60%,    Family History  Problem Relation Age of Onset  . Diabetes Mother   . Heart disease Mother   . Hyperlipidemia Mother   . Hypertension Mother   . Diabetes Father   . Heart disease Father     before age 5  .  Hyperlipidemia Father   . Hypertension Father   . Heart attack Father    Social History:  reports that she quit smoking about 22 years ago. Her smoking use included Cigarettes. She has a 40 pack-year smoking history. She has never used smokeless tobacco. She reports that she does not drink alcohol or use illicit drugs. Allergies: No Known Allergies Medications Prior to Admission  Medication Sig Dispense  Refill  . aspirin EC 81 MG tablet Take 81 mg by mouth daily.    Marland Kitchen atenolol (TENORMIN) 25 MG tablet Take 25 mg by mouth daily.    Marland Kitchen gabapentin (NEURONTIN) 300 MG capsule Take 300 mg by mouth 3 (three) times daily.    . metFORMIN (GLUCOPHAGE-XR) 500 MG 24 hr tablet Take 500 mg by mouth daily.  3  . Multiple Vitamins-Minerals (MULTIVITAMIN PO) Take by mouth daily.    . pravastatin (PRAVACHOL) 40 MG tablet Take 40 mg by mouth daily.      Home: Home Living Family/patient expects to be discharged to:: Private residence Living Arrangements: Alone Available Help at Discharge: Family, Available PRN/intermittently Type of Home: House Home Access: Stairs to enter Technical brewer of Steps: [place]: [place]: Environmental consultant - 2 wheels, Wheelchair - manual, Shower seat, Grab bars - tub/shower, [employer] - single point, Bedside commode Additional Comments: pt alone just lost spouse 2 weeks ago  Functional History: Prior Function Level of Independence: Independent with assistive device(s) Comments: works as a Development worker, community in [place]:  Mobility: Bed Mobility Overal bed mobility: [place] bed mobility comments: with heavy use of rail and increased time Transfers Overall transfer level: Needs assistance Transfers: Sit to/from Stand Sit to Stand: Supervision General transfer comment: cues for hand placement and safety Ambulation/Gait Ambulation/Gait assistance: Min guard Ambulation Distance (Feet): 15 Feet Assistive device: Rolling walker (2 wheeled) Gait Pattern/deviations: Step-through pattern, Decreased stride length Gait velocity interpretation: Below normal speed for age/gender General Gait Details: decreased speed with seated rest after 6' due to sats dropping to 85% on RA and dizziness, seated rest with return to 2L and sats maintained 93-95% on 2L with 15' gait. Pt fatigued and unable to ambulate further    ADL:     Cognition: Cognition Overall Cognitive Status: Within Functional Limits for tasks assessed Orientation Level: Oriented X4 Cognition Arousal/Alertness: Awake/alert Behavior During Therapy: WFL for tasks assessed/performed Overall Cognitive Status: Within Functional Limits for tasks assessed  Blood pressure 133/49, pulse 70, temperature 98 F (36.7 C), temperature source Oral, resp. rate 20, height 5\' 5"  (1.651 m), weight 93.26 kg (205 lb 9.6 oz), SpO2 98 %. Physical Exam  Constitutional: She is oriented to person, place, and time.  HENT:  Head: Normocephalic.  Eyes: EOM are normal.  Neck: Normal range of motion. Neck supple. No thyromegaly present.  Cardiovascular: Normal rate and regular rhythm.   Respiratory: Effort normal and breath sounds normal. No respiratory distress.  GI: Soft. Bowel sounds are normal. She exhibits no distension.  Musculoskeletal:  Chronic right ulnar hand weakness, distal sensory loss to PP and LT in both legs. Otherwise strength in UE's grossly 4/5. LE:s 3+HF, 4/5 knee and ankle. Cognitively appropriate  Neurological: She is alert and oriented to person, place, and time.  Follows commands  Skin: Skin is warm and dry.  Psychiatric: She has a normal mood and affect. Her behavior is normal.    Results for orders placed or performed during the hospital encounter of 12/06/14 (from the past 24 hour(s))  Glucose, capillary     Status: Abnormal   Collection Time: 12/10/14  5:14 PM  Result Value Ref Range   Glucose-Capillary 174 (H) 70 - 99 mg/dL   Comment 1 Notify RN   Glucose, capillary     Status: Abnormal   Collection Time: 12/10/14  9:10 PM  Result Value Ref Range   Glucose-Capillary 156 (H) 70 - 99 mg/dL   Comment 1 Notify RN    Comment 2 Document in Chart    Dg Chest 2 View  12/09/2014   CLINICAL DATA:  Dyspnea  EXAM: CHEST  2 VIEW  COMPARISON:  12/06/2014  FINDINGS: Cardiomegaly again noted. Central mild vascular congestion without pulmonary  edema. There is improvement in aeration. No segmental infiltrate. Dual lead cardiac pacemaker is unchanged in position.  IMPRESSION: Improvement in aeration. Cardiomegaly again noted. Central mild vascular contrast without pulmonary edema. No segmental infiltrate. Stable dual lead cardiac pacemaker position.   Electronically Signed   By: Lahoma Crocker M.D.   On: 12/09/2014 13:05    Assessment/Plan: Diagnosis: debility related to CHF, multiple medical 1. Does the need for close, 24 hr/day medical supervision in concert with the patient's rehab needs make it unreasonable for this patient to be served in a less intensive setting? Potentially 2. Co-Morbidities requiring supervision/potential complications: htn, hx of previous cervical cord injury with residual myelopathy? 3. Due to bladder management, bowel management, safety, skin/wound care, disease management, medication administration, pain management and patient education, does the patient require 24 hr/day rehab nursing? Potentially 4. Does the patient require coordinated care of a physician, rehab nurse, PT (1-2 hrs/day, 5 days/week) and OT (1-2 hrs/day, 5 days/week) to address physical and functional deficits in the context of the above medical diagnosis(es)? Potentially Addressing deficits in the following areas: balance, endurance, locomotion, strength, transferring, bowel/bladder control, bathing, dressing, feeding, grooming, toileting and psychosocial support 5. Can the patient actively participate in an intensive therapy program of at least 3 hrs of therapy per day at least 5 days per week? Potentially 6. The potential for patient to make measurable gains while on inpatient rehab is fair 7. Anticipated functional outcomes upon discharge from inpatient rehab are supervision  with PT, supervision with OT, n/a with SLP. 8. Estimated rehab length of stay to reach the above functional goals is: less than 7 days if at all 9. Does the patient have  adequate social supports and living environment to accommodate these discharge functional goals? Potentially 10. Anticipated D/C setting: Home 11. Anticipated post D/C treatments: [place] therapy 12. Overall Rehab/Functional Prognosis: excellent  RECOMMENDATIONS: This patient's condition is appropriate for continued rehabilitative care in the following setting: [place] vs SNF Patient has agreed to participate in recommended program. Potentially Note that insurance prior authorization may be required for reimbursement for recommended care.  Comment: Pt really wants to come to CIR. She is already at a min/guard assist level and has assistance at home initially. Will be hard pressed to justify an admission based on her functional need. Rehab Admissions Coordinator to follow up.  Thanks,  Meredith Staggers, MD, [facility]     12/11/2014

## 2014-12-11 NOTE — Consult Note (Signed)
Name: [name] MRN: [id number] DOB: [birth year]    ADMISSION DATE:  12/06/2014 CONSULTATION DATE:  4/11  REFERRING MD :  [name]   CHIEF COMPLAINT:  Hypoxia  BRIEF PATIENT DESCRIPTION:   74 year old female w/ PMH HTN, diastolic dysfn, pacemaker dependent since 09 d/t CHB, w/ 20 pack year hx (stopped smoking 20 yrs ago). Admitted on 4/8 w/ working dx of decompensated diastolic HF and accelerated HTN. Treated w/ aggressive diuresis and BP control. PCCM asked to see on 4/11 for on-going exertional O2 dependence in spite of above rx.   SIGNIFICANT EVENTS    STUDIES:  ECHO 4/8: EF 50-55%; Wall thickness wasincreased in a pattern of mild LVH. Ventricular septum: Septal motion showed abnormal function anddyssynergy. - Aortic valve: There was trivial regurgitation. - Mitral valve: Calcified annulus. There was mild regurgitation. - Left atrium: The atrium was mildly dilated.   HISTORY OF PRESENT ILLNESS:   This is a 74 year old female w/ known h/o HTN, diastolic dyfxn and pace-maker dependence since 09 for CHB. Admitted on 4/6 w/ CC: resting dyspnea. On presentation pt reports was in [place] until approx 3-4 wks prior. She first noted onset of exertional SOB while walking in long hallways at SNF where she was going to visit her husband. She also noted a cough around the onset of this time. It was productive usually only of clear white sputum. She noted it would be worse if she bent over or was lying down. She denied fever, chills. Her husband eventually died of what was reported to her as PNA on 3/25, so she did have potential sick exposure as well as frequent visits to the in-pt setting to visit him in the SNF. She said these symptoms continued to worsen. Her cough persisted. Her dyspnea progressed to resting. She did have some vague CP over right chest which she could not pin-point if this was associated w/ exertion or cough. She did have progressive LE swelling, constipation, and orthopnea.  She was admitted w/ working dx of decompensated diastolic HF. Since admit she has been aggressively diuresed. Her weight has steadily come down 214-->205. Although she feels remarkably better compared to when she came in, she still has significant symptom burden and has documented hypoxia down to 84% on room air. Prior to 4 weeks ago she was able to do most ADLS w/out sig symptom burden. PCCM was asked to see to assist w/ evaluation of the etiology of her dyspnea and hypoxia.   PAST MEDICAL HISTORY :   has a past medical history of Hypertension; Hypercholesterolemia; Pacemaker; Dysrhythmia; Neuropathy of both feet; Shortness of breath; Venous insufficiency (chronic) (peripheral); CHB (complete heart block) (09/19/2013); HTN (hypertension) (09/19/2013); Hyperlipidemia (09/19/2013); Paralysis of right hand (2007); CHF (congestive heart failure) (dx'd 12/06/2014); Coronary artery disease; Myocardial infarction (dx'd 2009); Diabetes mellitus; Type II diabetes mellitus; Arthritis; Chronic pain; and Urinary incontinence.  has past surgical history that includes Cardiac pacemaker placement (12/01/2007); Anterior cervical decomp/discectomy fusion (2007); Lumbar laminectomy/decompression microdiscectomy (03/02/2012); transthoracic echocardiogram (11/30/2007); nm myocar perf wall motion (10/24/2008); Cardiovascular stress test (08/28/2000); Lower Ext. Dopplers (06/12/2011); Back surgery; Laparoscopic cholecystectomy (1995); Knee arthroscopy (Right); Dilation and curettage of uterus (1960's); and Cardiac catheterization (09/10/2000). Prior to Admission medications   Medication Sig Start Date End Date Taking? Authorizing Provider  aspirin EC 81 MG tablet Take 81 mg by mouth daily.   Yes Historical Provider, MD  atenolol (TENORMIN) 25 MG tablet Take 25 mg by mouth daily. 04/04/14  Yes Historical  Provider, MD  gabapentin (NEURONTIN) 300 MG capsule Take 300 mg by mouth 3 (three) times daily. 11/30/14  Yes Historical Provider, MD    metFORMIN (GLUCOPHAGE-XR) 500 MG 24 hr tablet Take 500 mg by mouth daily. 09/26/14  Yes Historical Provider, MD  Multiple Vitamins-Minerals (MULTIVITAMIN PO) Take by mouth daily.   Yes Historical Provider, MD  pravastatin (PRAVACHOL) 40 MG tablet Take 40 mg by mouth daily.   Yes Historical Provider, MD   No Known Allergies  FAMILY HISTORY:  family history includes Diabetes in her father and mother; Heart attack in her father; Heart disease in her father and mother; Hyperlipidemia in her father and mother; Hypertension in her father and mother. SOCIAL HISTORY:  reports that she quit smoking about 22 years ago. Her smoking use included Cigarettes. She has a 40 pack-year smoking history. She has never used smokeless tobacco. She reports that she does not drink alcohol or use illicit drugs.  REVIEW OF SYSTEMS (bolds are positive):   Constitutional: Negative for fever, chills, weight loss, + malaise/fatigue and diaphoresis.  HENT: Negative for hearing loss, ear pain, nosebleeds, congestion, sore throat, neck pain, tinnitus and ear discharge.   Eyes: Negative for blurred vision, double vision, photophobia, pain, discharge and redness.  Respiratory: + cough, hemoptysis, sputum production, white/clear, shortness of breath was initially w/ 2 word dyspnea, now just w/ activity, wheezing and stridor.   Cardiovascular: Negative for chest pain, palpitations, orthopnea, claudication, leg swelling and PND.  Gastrointestinal: Negative for heartburn, nausea, vomiting, abdominal pain, diarrhea, constipation, blood in stool and melena.  Genitourinary: Negative for dysuria, urgency, frequency, hematuria and flank pain.  Musculoskeletal: Negative for myalgias, back pain, joint pain and falls.  Skin: Negative for itching and rash.  Neurological: Negative for dizziness, tingling, tremors, sensory change, speech change, focal weakness, seizures, loss of consciousness, weakness and headaches.  Endo/Heme/Allergies:  Negative for environmental allergies and polydipsia. Does not bruise/bleed easily.  SUBJECTIVE:  Feels better  VITAL SIGNS: Temp:  [98 F (36.7 C)-98.9 F (37.2 C)] 98 F (36.7 C) (04/10 2121) Pulse Rate:  [70-72] 70 (04/11 1243) Resp:  [20] 20 (04/10 2121) BP: (132-138)/(49-70) 132/70 mmHg (04/11 1007) SpO2:  [95 %-99 %] 95 % (04/11 1243) Weight:  [92.987 kg (205 lb)] 92.987 kg (205 lb) (04/11 0715)  PHYSICAL EXAMINATION: General:  Chronically ill appearing 74 year old female. Currently in no acute distress but still demonstrating some dyspnea just talking.  Neuro:  Awake, alert, no focal def  HEENT:  [place], no JVD  Cardiovascular:  rrr Lungs:  Basilar rales, no wheeze, no accessory muscle use. Cough productive of white sputum  Abdomen:  NT, + bowel sounds Musculoskeletal:  intact Skin:  Only trace LE edema    Recent Labs Lab 12/07/14 0525 12/08/14 0247 12/09/14 0408  NA 139 140 138  K 3.8 3.5 4.1  CL 95* 94* 94*  CO2 35* 39* 30  BUN 12 17 20   CREATININE 0.56 0.75 0.66  GLUCOSE 114* 118* 119*    Recent Labs Lab 12/06/14 1200  HGB 12.1  HCT 37.8  WBC 9.3  PLT 196   No results found.  ASSESSMENT / PLAN:  Exertional dyspnea/hypoxia Acute Decompensated Diastolic HF HTn H/o CHB; perm pacemaker dependent  DM Deconditioning  Discussion Suspect that her symptom burden can most-likely be explained still by decompensated HF. She does have a 20pk/yr h/o smoking but it seems unusual for her to suddenly developed obstructive lung disease > 20 yrs after smoking cessation. Wonder about  secondary PAH, although estimated CVP of 3 on echo would argue against this.   Recommendations  -CT chest. Doubt this is PE but her dyspnea does seem out of proportion to her CXR findings on 4/9 -Will decide on PFTs after CT chest. Would look for additional obstructive lung disease to see if there is something to treat there.  -Would continue aggressive diuresis w/ goal negative fluid  balance - will likely need home o2 - will hold off on BDs for now -check nocturnal oximetry to look for desaturation at HS. She is at risk for central sleep apnea.  -Agree she needs SNF for rehab  -Erick Colace ACNP-BC [place] Pager # [phone] OR # [phone] if no answer  Problem List Hypoxemia Sleep apnea Pulmonary edema R/O PE  I reviewed the CXR from 4/9 myself, her hypoxia seems more than CXR would explain, I saw only pulmonary edema on the CXR.    Plan: - Chest CT with contrast to r/o PE. - Will need an ambulatory desaturation study as will likely need home O2 (I suspect if this is not a PE that patient has been living hypoxemic prior to this but we just measured sat while in the hospital). - Check nocturnal O2 sat, high risk of apnea, if poor then will likely need BiPAP not CPAP here and upon discharge. - Recommend continuation of diureses, negative balance, as BP and renal function allows. - Pending results of CT will determine if PFTs are necessary now or wait until improved and discharged then do as outpatient. - Will need pulmonary f/u as outpatient, will arrange when closer to discharge. - I highly doubt that patient will be able to take of her ADL and SNF would be recommended until rehabed enough for discharge back to home.  The patient is critically ill with multiple organ systems failure and requires high complexity decision making for assessment and support, frequent evaluation and titration of therapies, application of advanced monitoring technologies and extensive interpretation of multiple databases.   Critical Care Time devoted to patient care services described in this note is  35  Minutes. This time reflects time of care of this signee Dr Jennet Maduro. This critical care time does not reflect procedure time, or teaching time or supervisory time of PA/NP/Med student/Med Resident etc but could involve care discussion time.  Rush Farmer,  M.D. [facility] Pulmonary/Critical Care Medicine. Pager: [phone]. After hours pager: [phone].  12/11/2014, 1:47 PM

## 2014-12-11 NOTE — Progress Notes (Signed)
Patient Name: [name] Date of Encounter: 12/11/2014     Principal Problem:   Accelerated hypertension with diastolic congestive heart failure, NYHA class 3 Active Problems:   CHB (complete heart block)   Pacemaker - dual chamber St Jude, 2009   Essential hypertension   Normal coronary arteries angiography 2002   Hyperlipidemia   Acute congestive heart failure    SUBJECTIVE  Still SOB but much improved from admission. No CP. Feels weak. Has had a cough since Jan. No   CURRENT MEDS . aspirin EC  81 mg Oral Daily  . atenolol  25 mg Oral Daily  . furosemide  40 mg Oral Daily  . gabapentin  300 mg Oral TID  . heparin  5,000 Units Subcutaneous 3 times per day  . insulin aspart  0-15 Units Subcutaneous TID WC  . insulin aspart  0-5 Units Subcutaneous QHS  . lisinopril  5 mg Oral Daily  . metFORMIN  500 mg Oral Q supper  . potassium chloride  20 mEq Oral Daily  . pravastatin  40 mg Oral Daily  . sodium chloride  3 mL Intravenous Q12H    OBJECTIVE  Filed Vitals:   12/10/14 1349 12/10/14 2121 12/11/14 0715 12/11/14 1007  BP: 138/54 133/49  132/70  Pulse: 70 70  72  Temp: 98.9 F (37.2 C) 98 F (36.7 C)    TempSrc: Oral Oral    Resp: 20 20    Height:      Weight:   205 lb (92.987 kg)   SpO2: 99% 98%      Intake/Output Summary (Last 24 hours) at 12/11/14 1112 Last data filed at 12/11/14 1012  Gross per 24 hour  Intake    802 ml  Output    600 ml  Net    202 ml   Filed Weights   12/09/14 0616 12/10/14 0557 12/11/14 0715  Weight: 206 lb 4.8 oz (93.577 kg) 205 lb 9.6 oz (93.26 kg) 205 lb (92.987 kg)    PHYSICAL EXAM  General: Pleasant, NAD. Obese. Appears SOB.  Neuro: Alert and oriented X 3. Moves all extremities spontaneously. Psych: Normal affect. HEENT:  Normal  Neck: Supple without bruits or JVD. Lungs:  Resp regular and unlabored, CTA. Heart: RRR no s3, s4, or murmurs. Abdomen: Soft, non-tender, non-distended, BS + x 4.  Extremities: No  clubbing, cyanosis or edema. DP/PT/Radials 2+ and equal bilaterally.  Accessory Clinical Findings  CBC No results for input(s): WBC, NEUTROABS, HGB, HCT, MCV, PLT in the last 72 hours. Basic Metabolic Panel  Recent Labs  12/09/14 0408  NA 138  K 4.1  CL 94*  CO2 30  GLUCOSE 119*  BUN 20  CREATININE 0.66  CALCIUM 8.7     TELE Rhythm is atrial sensed ventricular paced.  Radiology/Studies  Dg Chest 2 View  12/09/2014   CLINICAL DATA:  Dyspnea  EXAM: CHEST  2 VIEW  COMPARISON:  12/06/2014  FINDINGS: Cardiomegaly again noted. Central mild vascular congestion without pulmonary edema. There is improvement in aeration. No segmental infiltrate. Dual lead cardiac pacemaker is unchanged in position.  IMPRESSION: Improvement in aeration. Cardiomegaly again noted. Central mild vascular contrast without pulmonary edema. No segmental infiltrate. Stable dual lead cardiac pacemaker position.   Electronically Signed   By: Lahoma Crocker M.D.   On: 12/09/2014 13:05   Dg Chest Port 1 View  12/06/2014   CLINICAL DATA:  Shortness of Breath  EXAM: PORTABLE CHEST - 1 VIEW  COMPARISON:  February 27, 2012  FINDINGS: There is underlying emphysematous change. There is cardiomegaly with pulmonary venous hypertension and interstitial edema. There is no airspace consolidation. Pacemaker leads are attached to the right atrium and right ventricle. No adenopathy. There is postoperative change in the lower cervical spine.  IMPRESSION: Congestive heart failure superimposed on emphysematous change. No airspace consolidation.   Electronically Signed   By: Lowella Grip III M.D.   On: 12/06/2014 12:59   2D echo 12/07/14 Study Conclusions - Left ventricle: The cavity size was normal. Wall thickness was increased in a pattern of mild LVH. Systolic function was normal. The estimated ejection fraction was in the range of 50% to 55%. Wall motion was normal; there were no regional wall motion abnormalities. The study is  not technically sufficient to allow evaluation of LV diastolic function. - Ventricular septum: Septal motion showed abnormal function and dyssynergy. - Aortic valve: There was trivial regurgitation. - Mitral valve: Calcified annulus. There was mild regurgitation. - Left atrium: The atrium was mildly dilated. Impressions: - Normal to mild global reduction in LV function (EF 50); mild LAE; mild MR; trace TR.   ASSESSMENT AND PLAN  74 yo female w/ hx HTN, HL, DM, St Jude PPM for CHB, nl cors '02, nl EF, admitted 04/06 w/ SOB, CHF and new onset  Acute Diastolic CHF: EF normal on echo at 50-55%. BNP 599 on admission. CXR with pulm edema.  -- Net neg 3.4L and weight down 9 lbs ( 214--> 205 lbs). Switched to PO Lasix on 12/10/14. Continue 40mg  po qd. Repeat CXR with improved aeration without pulmonary edema or segmental infiltrate. -- 02 sats dropped with ambulation- she will require home 02. Still quite SOB on 02. She does have a hx of smoking, no past hx of COPD. Has had a cough since January. Will have pulm see today.   HTN: well controlled. Continue atenolol 25mg , lasix 40mg  qd and lisinopril 5mg   DM- continue home regimen  HLD- cont statin.  Deconditioning- PT recommending SNF. Patient and family do not want to go to SNF and want inpatient rehab, however, there are no beds availble. (Her husband recently died in a SNF)   Signed, Eileen Stanford PA-C  Pager [phone]  Attending Note:   The patient was seen and examined.  Agree with assessment and plan as noted above.  Changes made to the above note as needed.  Pt was admitted with severe dyspnea.  Diagnosed with CHf Has been diuresed well.  BNP is now normal.  Despite this, she is still very short of breath.  Still requiring supplemental O2. Not well enough to go home.  Will ask pulmonary to see her for further suggestions.  Continue current meds for now  Her husband died recently in a nursing home.  Family refuses to  have patient admitted to a nursing home because of the anxiety around her her husbands recent death.   Thayer Headings, Brooke Bonito., MD, [facility] 12/11/2014, 12:20 PM [address]  [place] Pager [phone]

## 2014-12-11 NOTE — Progress Notes (Signed)
Physical Therapy Treatment Patient Details Name: [name] MRN: [id number] DOB: 02/07/1941 Today's Date: 12/11/2014    History of Present Illness The patient is a 74yo female with a history of HTN, HLD, DM,cervical fusion with resultant incontinence. She is pacemaker dependent with complete heart block since 2009.Admitted with SOB, leg weakness and fall at MD office    PT Comments    Pt progressing with mobility and able to ambulate in hall but continues to require supplemental oxygen as well as cues for safety with mobility. Pt with dyspnea 2-3/4 with activity and no 24hr supervision arranged for home. Pt states she denies ST-SNF due to recent loss of spouse while he was in facility. Will continue to follow to maximize activity and gait with pt encouraged to continue in room ambulation and HEP as well as pursed lip breathing.   Follow Up Recommendations  SNF;Supervision for mobility/OOB (pt denying SNF and recommend HHPT if discharges home)     Equipment Recommendations  None recommended by PT    Recommendations for Other Services       Precautions / Restrictions Precautions Precautions: Fall Precaution Comments: watch sats    Mobility  Bed Mobility Overal bed mobility: Modified Independent                Transfers Overall transfer level: Needs assistance   Transfers: Sit to/from Stand Sit to Stand: Supervision         General transfer comment: cues for hand placement and safety  Ambulation/Gait Ambulation/Gait assistance: Min guard Ambulation Distance (Feet): 100 Feet Assistive device: Rolling walker (2 wheeled) Gait Pattern/deviations: Step-through pattern;Decreased stride length;Trunk flexed     General Gait Details: decreased speed with cues for posture, position in RA, looking up and breathing technique. Pt with sats 94% maintained on 2L with gait but with second gait trial of same distance but sats dropped to 84% on RA.   Stairs             Wheelchair Mobility    Modified Rankin (Stroke Patients Only)       Balance                                    Cognition Arousal/Alertness: Awake/alert Behavior During Therapy: WFL for tasks assessed/performed Overall Cognitive Status: Within Functional Limits for tasks assessed                      Exercises General Exercises - Lower Extremity Long Arc Quad: AROM;Seated;Both;15 reps Hip Flexion/Marching: AROM;Seated;Both;15 reps    General Comments        Pertinent Vitals/Pain Pain Assessment: No/denies pain  HR 70-77    Home Living                      Prior Function            PT Goals (current goals can now be found in the care plan section) Progress towards PT goals: Progressing toward goals    Frequency       PT Plan Current plan remains appropriate    Co-evaluation             End of Session Equipment Utilized During Treatment: Oxygen Activity Tolerance: Patient limited by fatigue Patient left: in chair;with chair alarm set;with call bell/phone within reach;with family/visitor present     Time: 1206-1238 PT Time Calculation (min) (ACUTE ONLY): 32  min  Charges:  $Gait Training: 8-22 mins $Therapeutic Exercise: 8-22 mins                    G Codes:      Melford Aase 12/14/14, 12:46 PM Elwyn Reach, [place]

## 2014-12-12 DIAGNOSIS — I272 Pulmonary hypertension, unspecified: Secondary | ICD-10-CM | POA: Insufficient documentation

## 2014-12-12 LAB — BASIC METABOLIC PANEL
ANION GAP: 8 (ref 5–15)
BUN: 19 mg/dL (ref 6–23)
CALCIUM: 8.9 mg/dL (ref 8.4–10.5)
CO2: 33 mmol/L — AB (ref 19–32)
Chloride: 95 mmol/L — ABNORMAL LOW (ref 96–112)
Creatinine, Ser: 0.64 mg/dL (ref 0.50–1.10)
GFR calc Af Amer: >90 mL/min (ref >90)
GFR, EST NON AFRICAN AMERICAN: 85 mL/min — AB (ref >90)
Glucose, Bld: 123 mg/dL — ABNORMAL HIGH (ref 70–99)
POTASSIUM: 4.7 mmol/L (ref 3.5–5.1)
SODIUM: 136 mmol/L (ref 135–145)

## 2014-12-12 LAB — GLUCOSE, CAPILLARY
Glucose-Capillary: 123 mg/dL — ABNORMAL HIGH (ref 70–99)
Glucose-Capillary: 158 mg/dL — ABNORMAL HIGH (ref 70–99)
Glucose-Capillary: 152 mg/dL — ABNORMAL HIGH (ref 70–99)
Glucose-Capillary: 136 mg/dL — ABNORMAL HIGH (ref 70–99)

## 2014-12-12 LAB — CBC
HCT: 41.4 % (ref 36.0–46.0)
HEMOGLOBIN: 12.9 g/dL (ref 12.0–15.0)
MCH: 29 pg (ref 26.0–34.0)
MCHC: 31.2 g/dL (ref 30.0–36.0)
MCV: 93 fL (ref 78.0–100.0)
Platelets: 216 10*3/uL (ref 150–400)
RBC: 4.45 MIL/uL (ref 3.87–5.11)
RDW: 13.8 % (ref 11.5–15.5)
WBC: 7.6 10*3/uL (ref 4.0–10.5)

## 2014-12-12 MED ORDER — FUROSEMIDE 10 MG/ML IJ SOLN
40.0000 mg | Freq: Every day | INTRAMUSCULAR | Status: DC
  Administered 2014-12-12 – 2014-12-13 (×2): 40 mg via INTRAVENOUS
  Filled 2014-12-12 (×3): qty 4

## 2014-12-12 NOTE — Consult Note (Signed)
Rehab admissions - Evaluated for possible admission.  Please see rehab consult done by Dr. Naaman Plummer recommending [facility] versus SNF placement.  Doing too well to meet criteria for acute inpatient rehab admission.  Call me for questions.  #[phone]

## 2014-12-12 NOTE — Progress Notes (Signed)
Pt O2 sat dropped to 87% on RA when ambulating to nurses station, up to 95% on 2Lnc.

## 2014-12-12 NOTE — Progress Notes (Signed)
SATURATION QUALIFICATIONS: (This note is used to comply with regulatory documentation for home oxygen)  Patient Saturations on Room Air at Rest = 89%  Patient Saturations on Room Air while Ambulating = 83%  Patient Saturations on 2 Liters of oxygen while Ambulating = 87%  Patient Saturations at rest on 2 L of oxygen = 91%

## 2014-12-12 NOTE — Progress Notes (Signed)
I cosign all documentation and medication by Tina Griffiths for this shift. Ronnette Hila, RN

## 2014-12-12 NOTE — Progress Notes (Signed)
Telebox was changed from 3E18 to 3E07, as original tele box was no longer registering continuous O2 sats.  Continuous O2 sats are now being monitored again.

## 2014-12-12 NOTE — Consult Note (Signed)
Name: [name] MRN: [id number] DOB: March 04, 1941    ADMISSION DATE:  12/06/2014 CONSULTATION DATE:  4/11  REFERRING MD :  [name]   CHIEF COMPLAINT:  Hypoxia  BRIEF PATIENT DESCRIPTION:   74 year old female w/ PMH HTN, diastolic dysfn, pacemaker dependent since 09 d/t CHB, w/ 20 pack year hx (stopped smoking 20 yrs ago). Admitted on 4/8 w/ working dx of decompensated diastolic HF and accelerated HTN. Treated w/ aggressive diuresis and BP control. PCCM asked to see on 4/11 for on-going exertional O2 dependence in spite of above rx.   SIGNIFICANT EVENTS    STUDIES:  ECHO 4/8: EF 50-55%; Wall thickness wasincreased in a pattern of mild LVH. Ventricular septum: Septal motion showed abnormal function anddyssynergy. - Aortic valve: There was trivial regurgitation. - Mitral valve: Calcified annulus. There was mild regurgitation. - Left atrium: The atrium was mildly dilated. 4/11 ct chest no pe +PAH  HISTORY OF PRESENT ILLNESS:   This is a 74 year old female w/ known h/o HTN, diastolic dyfxn and pace-maker dependence since 09 for CHB. Admitted on 4/6 w/ CC: resting dyspnea. On presentation pt reports was in [place] until approx 3-4 wks prior. She first noted onset of exertional SOB while walking in long hallways at SNF where she was going to visit her husband. She also noted a cough around the onset of this time. It was productive usually only of clear white sputum. She noted it would be worse if she bent over or was lying down. She denied fever, chills. Her husband eventually died of what was reported to her as PNA on 3/25, so she did have potential sick exposure as well as frequent visits to the in-pt setting to visit him in the SNF. She said these symptoms continued to worsen. Her cough persisted. Her dyspnea progressed to resting. She did have some vague CP over right chest which she could not pin-point if this was associated w/ exertion or cough. She did have progressive LE swelling,  constipation, and orthopnea. She was admitted w/ working dx of decompensated diastolic HF. Since admit she has been aggressively diuresed. Her weight has steadily come down 214-->205. Although she feels remarkably better compared to when she came in, she still has significant symptom burden and has documented hypoxia down to 84% on room air. Prior to 4 weeks ago she was able to do most ADLS w/out sig symptom burden. PCCM was asked to see to assist w/ evaluation of the etiology of her dyspnea and hypoxia.    SUBJECTIVE:  [name] VITAL SIGNS: Temp:  [97.7 F (36.5 C)-98.7 F (37.1 C)] 97.7 F (36.5 C) (04/12 0639) Pulse Rate:  [69-74] 74 (04/12 1008) Resp:  [14-18] 14 (04/12 0639) BP: (123-125)/(49-64) 125/64 mmHg (04/12 1008) SpO2:  [95 %-97 %] 97 % (04/12 0639) Weight:  [205 lb 9.6 oz (93.26 kg)] 205 lb 9.6 oz (93.26 kg) (04/12 3559)  PHYSICAL EXAMINATION: General:  Chronically ill appearing 74 year old female. Currently in no acute distress but still demonstrating some dyspnea just talking. No  improvement  Neuro:  Awake, alert, no focal def  HEENT:  [place], no JVD  Cardiovascular:  rrr Lungs:  Basilar rales, no wheeze, no accessory muscle use. Cough productive of white sputum  Abdomen:  NT, + bowel sounds Musculoskeletal:  intact Skin:  Only trace LE edema    Recent Labs Lab 12/09/14 0408 12/11/14 1424 12/12/14 0530  NA 138 136 136  K 4.1 4.6 4.7  CL 94*  91* 95*  CO2 30 36* 33*  BUN 20 20 19   CREATININE 0.66 0.98 0.64  GLUCOSE 119* 170* 123*    Recent Labs Lab 12/06/14 1200 12/11/14 1424 12/12/14 0530  HGB 12.1 12.5 12.9  HCT 37.8 39.1 41.4  WBC 9.3 7.5 7.6  PLT 196 226 216   Ct Angio Chest Pe W/cm &/or Wo Cm  12/11/2014   CLINICAL DATA:  Dyspnea since 12/06/2014.  EXAM: CT ANGIOGRAPHY CHEST WITH CONTRAST  TECHNIQUE: Multidetector CT imaging of the chest was performed using the standard protocol during bolus administration of intravenous contrast. Multiplanar CT image  reconstructions and MIPs were obtained to evaluate the vascular anatomy.  CONTRAST:  80 mL OMNIPAQUE IOHEXOL 350 MG/ML SOLN  COMPARISON:  Plain films of the chest 12/09/2014.  FINDINGS: No pulmonary embolus is identified. The right and left main pulmonary arteries are dilated, approximately the same size as the ascending aorta compatible with pulmonary arterial hypertension. There is mild cardiomegaly. No pleural or pericardial effusion. Calcific aortic and coronary atherosclerosis is identified. A pacing device in place. There is no axillary, hilar or mediastinal lymphadenopathy. The lungs show linear atelectatic change in the right base.  Visualized upper abdomen demonstrates no focal abnormality. No lytic or sclerotic bony lesion is seen.  Review of the MIP images confirms the above findings.  IMPRESSION: Negative for pulmonary embolus.  Mild cardiomegaly.  Calcific aortic and coronary atherosclerosis.  Findings compatible with pulmonary arterial hypertension.   Electronically Signed   By: [name] M.D.   On: 12/11/2014 16:07    ASSESSMENT / PLAN:  Exertional dyspnea/hypoxia Acute Decompensated Diastolic HF HTn H/o CHB; perm pacemaker dependent  DM Deconditioning  Discussion Suspect that her symptom burden can most-likely be explained still by decompensated HF. She does have a 20pk/yr h/o smoking but it seems unusual for her to suddenly developed obstructive lung disease > 20 yrs after smoking cessation. Wonder about secondary PAH, although estimated CVP of 3 on echo would argue against this.   Recommendations  - CT chest negative for PE. - Will order PFTs today. - Would continue aggressive diuresis w/ goal negative fluid balance - Will likely need home o2 - Will hold off on BDs for now - Check nocturnal oximetry to look for desaturation at HS. She is at risk for central sleep apnea. Not done - Agree she needs SNF for rehab  [facility] Minor ACNP [name] PCCM Pager [phone] till 3  pm If no answer page [phone]  I reviewed the chest CT myself, negative for PE but pulmonary arteries are enlarged indicating pulmonary hypertension.  Problem List: - Hypoxemia. - Pulmonary hypertension by CT. - Nocturnal desaturation. - Obstructive lung disease.  Recommendations: - PFTs ordered. - Nocturnal desat ordered. - Ambulatory desaturation study ordered. - Continue diureses. - No anti-coagulation. - No need for steroids. - Recommend calling CSW for placement. - Will follow.  Patient seen and examined, agree with above note.  I dictated the care and orders written for this patient under my direction.  Rush Farmer, MD [phone]  12/12/2014, 10:29 AM

## 2014-12-12 NOTE — Progress Notes (Signed)
Patient Name: [name] Date of Encounter: 12/12/2014     Principal Problem:   Accelerated hypertension with diastolic congestive heart failure, NYHA class 3 Active Problems:   CHB (complete heart block)   Pacemaker - dual chamber St Jude, 2009   Essential hypertension   Normal coronary arteries angiography 2002   Hyperlipidemia   Acute congestive heart failure   Congestive heart disease   Dyspnea   SOB (shortness of breath)   Sleep apnea   Hypoxemia   Acute respiratory failure with hypoxemia   Acute pulmonary edema    SUBJECTIVE  Still SOB and no improvement overnight although she looks less uncomfortable to me. Still with cough  CURRENT MEDS . aspirin EC  81 mg Oral Daily  . atenolol  25 mg Oral Daily  . furosemide  40 mg Oral Daily  . gabapentin  300 mg Oral TID  . heparin  5,000 Units Subcutaneous 3 times per day  . insulin aspart  0-15 Units Subcutaneous TID WC  . insulin aspart  0-5 Units Subcutaneous QHS  . lisinopril  5 mg Oral Daily  . metFORMIN  500 mg Oral Q supper  . potassium chloride  20 mEq Oral Daily  . pravastatin  40 mg Oral Daily  . sodium chloride  3 mL Intravenous Q12H    OBJECTIVE  Filed Vitals:   12/11/14 1243 12/11/14 1400 12/11/14 2004 12/12/14 0639  BP:  124/55 125/49 123/49  Pulse: 70 70 70 69  Temp:  97.7 F (36.5 C) 98.7 F (37.1 C) 97.7 F (36.5 C)  TempSrc:  Oral Oral Oral  Resp:  18 18 14   Height:      Weight:    205 lb 9.6 oz (93.26 kg)  SpO2: 95% 96% 95% 97%    Intake/Output Summary (Last 24 hours) at 12/12/14 0850 Last data filed at 12/12/14 0643  Gross per 24 hour  Intake   1284 ml  Output    725 ml  Net    559 ml   Filed Weights   12/10/14 0557 12/11/14 0715 12/12/14 0639  Weight: 205 lb 9.6 oz (93.26 kg) 205 lb (92.987 kg) 205 lb 9.6 oz (93.26 kg)    PHYSICAL EXAM  General: Pleasant, NAD. Obese. Appears comfortable on 02. Neuro: Alert and oriented X 3. Moves all extremities spontaneously. Psych:  Normal affect. HEENT:  Normal  Neck: Supple without bruits or JVD. Lungs:  Resp regular and unlabored, CTA. Heart: RRR no s3, s4, or murmurs. Abdomen: Soft, non-tender, non-distended, BS + x 4.  Extremities: No clubbing, cyanosis or edema. DP/PT/Radials 2+ and equal bilaterally.  Accessory Clinical Findings  CBC  Recent Labs  12/11/14 1424 12/12/14 0530  WBC 7.5 7.6  HGB 12.5 12.9  HCT 39.1 41.4  MCV 93.1 93.0  PLT 226 026   Basic Metabolic Panel  Recent Labs  12/11/14 1424 12/12/14 0530  NA 136 136  K 4.6 4.7  CL 91* 95*  CO2 36* 33*  GLUCOSE 170* 123*  BUN 20 19  CREATININE 0.98 0.64  CALCIUM 8.8 8.9     TELE Rhythm is atrial sensed ventricular paced.  Radiology/Studies  Dg Chest 2 View  12/09/2014   CLINICAL DATA:  Dyspnea  EXAM: CHEST  2 VIEW  COMPARISON:  12/06/2014  FINDINGS: Cardiomegaly again noted. Central mild vascular congestion without pulmonary edema. There is improvement in aeration. No segmental infiltrate. Dual lead cardiac pacemaker is unchanged in position.  IMPRESSION: Improvement in aeration. Cardiomegaly again  noted. Central mild vascular contrast without pulmonary edema. No segmental infiltrate. Stable dual lead cardiac pacemaker position.   Electronically Signed   By: Lahoma Crocker M.D.   On: 12/09/2014 13:05   Ct Angio Chest Pe W/cm &/or Wo Cm  12/11/2014   CLINICAL DATA:  Dyspnea since 12/06/2014.  EXAM: CT ANGIOGRAPHY CHEST WITH CONTRAST  TECHNIQUE: Multidetector CT imaging of the chest was performed using the standard protocol during bolus administration of intravenous contrast. Multiplanar CT image reconstructions and MIPs were obtained to evaluate the vascular anatomy.  CONTRAST:  80 mL OMNIPAQUE IOHEXOL 350 MG/ML SOLN  COMPARISON:  Plain films of the chest 12/09/2014.  FINDINGS: No pulmonary embolus is identified. The right and left main pulmonary arteries are dilated, approximately the same size as the ascending aorta compatible with  pulmonary arterial hypertension. There is mild cardiomegaly. No pleural or pericardial effusion. Calcific aortic and coronary atherosclerosis is identified. A pacing device in place. There is no axillary, hilar or mediastinal lymphadenopathy. The lungs show linear atelectatic change in the right base.  Visualized upper abdomen demonstrates no focal abnormality. No lytic or sclerotic bony lesion is seen.  Review of the MIP images confirms the above findings.  IMPRESSION: Negative for pulmonary embolus.  Mild cardiomegaly.  Calcific aortic and coronary atherosclerosis.  Findings compatible with pulmonary arterial hypertension.   Electronically Signed   By: Inge Rise M.D.   On: 12/11/2014 16:07   Dg Chest Port 1 View  12/06/2014   CLINICAL DATA:  Shortness of Breath  EXAM: PORTABLE CHEST - 1 VIEW  COMPARISON:  February 27, 2012  FINDINGS: There is underlying emphysematous change. There is cardiomegaly with pulmonary venous hypertension and interstitial edema. There is no airspace consolidation. Pacemaker leads are attached to the right atrium and right ventricle. No adenopathy. There is postoperative change in the lower cervical spine.  IMPRESSION: Congestive heart failure superimposed on emphysematous change. No airspace consolidation.   Electronically Signed   By: Lowella Grip III M.D.   On: 12/06/2014 12:59   2D echo 12/07/14 Study Conclusions - Left ventricle: The cavity size was normal. Wall thickness was increased in a pattern of mild LVH. Systolic function was normal. The estimated ejection fraction was in the range of 50% to 55%. Wall motion was normal; there were no regional wall motion abnormalities. The study is not technically sufficient to allow evaluation of LV diastolic function. - Ventricular septum: Septal motion showed abnormal function and dyssynergy. - Aortic valve: There was trivial regurgitation. - Mitral valve: Calcified annulus. There was mild regurgitation. -  Left atrium: The atrium was mildly dilated. Impressions: - Normal to mild global reduction in LV function (EF 50); mild LAE; mild MR; trace TR.   ASSESSMENT AND PLAN  74 yo female w/ hx HTN, HL, DM, St Jude PPM for CHB, nl cors '02, nl EF, admitted 04/06 w/ SOB and CHF.   Acute Diastolic CHF: EF normal on echo at 50-55%. BNP 599 on admission. CXR with pulm edema.  -- Net neg 3.1L and weight down 9 lbs ( 214--> 205 lbs). Switched to PO Lasix on 12/10/14 and placed on 40mg  po qd. Repeat CXR with improved aeration without pulmonary edema or segmental infiltrate. BNP improved. -- Patient with continued hypoxia and dyspnea despite adequate diuresis. PCCM was asked to see to assist w/ evaluation of the etiology of her dyspnea and hypoxia, which seemed out of proportion to imaging and laboratory exam. They performed CTA which was negative for PE and  nocturnal oximetry to look for desaturation at HS as she is at risk for central sleep apnea. May perform PFTs today. She will require pulmonary follow up.  They also have recommended to continue aggressive diuresis w/ goal negative fluid balance. Will place her back on IV lasix 40 mg qd.   HTN: well controlled. Continue atenolol 25mg , lasix 40mg  qd and lisinopril 5mg   DM- continue home regimen  HLD- cont statin.  Deconditioning- PT recommending SNF. Patient and family do not want to go to SNF and want inpatient rehab, however, there are no beds availble. (Her husband recently died in a SNF)   Signed, Eileen Stanford PA-C  Pager [phone]   Attending Note:   The patient was seen and examined.  Agree with assessment and plan as noted above.  Changes made to the above note as needed.  Discussed with Dr. Nelda Marseille.  Will get PFTs. She may need a right heart cath if we do not find an etiology on PFTs.  There was some discussion about a Bi-V pacer.  Will leave that up to Dr. Loletha Grayer.   She needs to ambulate.  Will get PT involved.    Thayer Headings, Brooke Bonito., MD, [facility] 12/12/2014, 12:17 PM [address],  [place] Pager [phone]

## 2014-12-13 ENCOUNTER — Other Ambulatory Visit (HOSPITAL_COMMUNITY): Payer: Self-pay | Admitting: Radiology

## 2014-12-13 DIAGNOSIS — I442 Atrioventricular block, complete: Secondary | ICD-10-CM

## 2014-12-13 DIAGNOSIS — R0602 Shortness of breath: Secondary | ICD-10-CM

## 2014-12-13 DIAGNOSIS — I270 Primary pulmonary hypertension: Secondary | ICD-10-CM

## 2014-12-13 LAB — BASIC METABOLIC PANEL
Anion gap: 9 (ref 5–15)
BUN: 24 mg/dL — AB (ref 6–23)
CO2: 30 mmol/L (ref 19–32)
CREATININE: 0.59 mg/dL (ref 0.50–1.10)
Calcium: 8.8 mg/dL (ref 8.4–10.5)
Chloride: 97 mmol/L (ref 96–112)
GFR, EST NON AFRICAN AMERICAN: 87 mL/min — AB (ref >90)
Glucose, Bld: 109 mg/dL — ABNORMAL HIGH (ref 70–99)
Potassium: 5 mmol/L (ref 3.5–5.1)
Sodium: 136 mmol/L (ref 135–145)

## 2014-12-13 LAB — GLUCOSE, CAPILLARY
Glucose-Capillary: 120 mg/dL — ABNORMAL HIGH (ref 70–99)
Glucose-Capillary: 145 mg/dL — ABNORMAL HIGH (ref 70–99)
Glucose-Capillary: 159 mg/dL — ABNORMAL HIGH (ref 70–99)
Glucose-Capillary: 117 mg/dL — ABNORMAL HIGH (ref 70–99)

## 2014-12-13 LAB — CBC
HCT: 42.7 % (ref 36.0–46.0)
Hemoglobin: 13.2 g/dL (ref 12.0–15.0)
MCH: 29 pg (ref 26.0–34.0)
MCHC: 30.9 g/dL (ref 30.0–36.0)
MCV: 93.8 fL (ref 78.0–100.0)
PLATELETS: 211 10*3/uL (ref 150–400)
RBC: 4.55 MIL/uL (ref 3.87–5.11)
RDW: 13.9 % (ref 11.5–15.5)
WBC: 8.5 10*3/uL (ref 4.0–10.5)

## 2014-12-13 MED ORDER — ASPIRIN 81 MG PO CHEW
81.0000 mg | CHEWABLE_TABLET | ORAL | Status: AC
  Administered 2014-12-14: 81 mg via ORAL
  Filled 2014-12-13: qty 1

## 2014-12-13 MED ORDER — SODIUM CHLORIDE 0.9 % IV SOLN: 250.0000 mL | INTRAVENOUS | Status: DC | PRN

## 2014-12-13 MED ORDER — SODIUM CHLORIDE 0.9 % IJ SOLN: 3.0000 mL | Freq: Two times a day (BID) | INTRAMUSCULAR | Status: DC

## 2014-12-13 MED ORDER — ASPIRIN EC 81 MG PO TBEC
81.0000 mg | DELAYED_RELEASE_TABLET | Freq: Every day | ORAL | Status: DC
  Administered 2014-12-15: 81 mg via ORAL
  Filled 2014-12-13: qty 1

## 2014-12-13 MED ORDER — SODIUM CHLORIDE 0.9 % IJ SOLN
3.0000 mL | INTRAMUSCULAR | Status: DC | PRN
Start: 2014-12-13 — End: 2014-12-14

## 2014-12-13 MED ORDER — SODIUM CHLORIDE 0.9 % IV SOLN: INTRAVENOUS | Status: DC

## 2014-12-13 NOTE — Progress Notes (Signed)
Physical Therapy Treatment Patient Details Name: [name] MRN: [id number] DOB: August 08, 1941 Today's Date: 19-Dec-2014    History of Present Illness The patient is a 74yo female with a history of HTN, HLD, DM,cervical fusion with resultant incontinence. She is pacemaker dependent with complete heart block since 2009.Admitted with SOB, leg weakness and fall at MD office    PT Comments    Patient continues to be safety risk to go home alone.  Feel she will benefit from increased time to rehab in SNF setting as well to gain confidence and further improve respiratory status.  Follow Up Recommendations  SNF;Supervision for mobility/OOB     Equipment Recommendations  None recommended by PT    Recommendations for Other Services       Precautions / Restrictions Precautions Precautions: Fall Precaution Comments: watch sats    Mobility  Bed Mobility Overal bed mobility: Modified Independent                Transfers Overall transfer level: Needs assistance     Sit to Stand: Supervision;Min guard         General transfer comment: cues for hand placement and safety; assist due to fatigue  Ambulation/Gait Ambulation/Gait assistance: Min guard Ambulation Distance (Feet): 120 Feet (and 100' seated rest between) Assistive device: Rolling walker (2 wheeled) Gait Pattern/deviations: Step-through pattern;Decreased stride length;Trunk flexed     General Gait Details: sats as low as 74% with second bout of ambulation, initially up at 99% on 2L O2 and maintained on O2 throughout session; pt c/o hot & sweaty with ambulation so needed assist to maintain safety when fatigued and increased anxiety   Stairs            Wheelchair Mobility    Modified Rankin (Stroke Patients Only)       Balance Overall balance assessment: Needs assistance         Standing balance support: During functional activity;No upper extremity supported Standing balance-Leahy Scale:  Poor Standing balance comment: assist when performing perineal hygiene and to don depends                    Cognition Arousal/Alertness: Awake/alert Behavior During Therapy: WFL for tasks assessed/performed Overall Cognitive Status: Within Functional Limits for tasks assessed                      Exercises      General Comments        Pertinent Vitals/Pain Pain Assessment: No/denies pain    Home Living                      Prior Function            PT Goals (current goals can now be found in the care plan section) Progress towards PT goals: Progressing toward goals    Frequency  Min 3X/week    PT Plan Current plan remains appropriate    Co-evaluation             End of Session Equipment Utilized During Treatment: Gait belt;Oxygen Activity Tolerance: Patient limited by fatigue Patient left: in bed;with call bell/phone within reach     Time: 3875-6433 PT Time Calculation (min) (ACUTE ONLY): 29 min  Charges:  $Gait Training: 23-37 mins                    G Codes:      WYNN,CYNDI 2014/12/19, 4:54 PM  Magda Kiel,  PT [phone] 12/13/2014

## 2014-12-13 NOTE — Progress Notes (Addendum)
Name: [name] MRN: [id number] DOB: 25-Jul-1941    ADMISSION DATE:  12/06/2014 CONSULTATION DATE:  4/11  REFERRING MD :  [name]   CHIEF COMPLAINT:  Hypoxia  BRIEF PATIENT DESCRIPTION:   74 year old female w/ PMH HTN, diastolic dysfn, pacemaker dependent since 09 d/t CHB, w/ 20 pack year hx (stopped smoking 20 yrs ago). Admitted on 4/8 w/ working dx of decompensated diastolic HF and accelerated HTN. Treated w/ aggressive diuresis and BP control. PCCM asked to see on 4/11 for on-going exertional O2 dependence in spite of above rx.   SIGNIFICANT EVENTS    STUDIES:  ECHO 4/8: EF 50-55%; Wall thickness wasincreased in a pattern of mild LVH. Ventricular septum: Septal motion showed abnormal function anddyssynergy. - Aortic valve: There was trivial regurgitation. - Mitral valve: Calcified annulus. There was mild regurgitation. - Left atrium: The atrium was mildly dilated. 4/11 ct chest >>>no pe +PAH  SUBJECTIVE:  Feeling maybe slightly better but remains very SOB, especially with any activity.     VITAL SIGNS: Temp:  [98.1 F (36.7 C)-99 F (37.2 C)] 98.1 F (36.7 C) (04/13 0549) Pulse Rate:  [69-74] 70 (04/13 0549) Resp:  [16] 16 (04/13 0549) BP: (116-128)/(39-64) 116/39 mmHg (04/13 0549) SpO2:  [96 %] 96 % (04/13 0549) Weight:  [206 lb 5.6 oz (93.6 kg)] 206 lb 5.6 oz (93.6 kg) (04/13 0549)  PHYSICAL EXAMINATION: General:  Chronically ill appearing 74 year old female. NAD but easily dyspneic with talking, moving.  Neuro:  Awake, alert, no focal def  HEENT:  [place], no JVD  Cardiovascular:  rrr Lungs:  Resps even, non labored at rest, tachypneic when speaking, few basilar rales, no wheeze Abdomen:  NT, + bowel sounds Musculoskeletal:  intact Skin:  Only trace LE edema    Recent Labs Lab 12/11/14 1424 12/12/14 0530 12/13/14 0340  NA 136 136 136  K 4.6 4.7 5.0  CL 91* 95* 97  CO2 36* 33* 30  BUN 20 19 24*  CREATININE 0.98 0.64 0.59  GLUCOSE 170* 123* 109*     Recent Labs Lab 12/11/14 1424 12/12/14 0530 12/13/14 0340  HGB 12.5 12.9 13.2  HCT 39.1 41.4 42.7  WBC 7.5 7.6 8.5  PLT 226 216 211   Ct Angio Chest Pe W/cm &/or Wo Cm  12/11/2014   CLINICAL DATA:  Dyspnea since 12/06/2014.  EXAM: CT ANGIOGRAPHY CHEST WITH CONTRAST  TECHNIQUE: Multidetector CT imaging of the chest was performed using the standard protocol during bolus administration of intravenous contrast. Multiplanar CT image reconstructions and MIPs were obtained to evaluate the vascular anatomy.  CONTRAST:  80 mL OMNIPAQUE IOHEXOL 350 MG/ML SOLN  COMPARISON:  Plain films of the chest 12/09/2014.  FINDINGS: No pulmonary embolus is identified. The right and left main pulmonary arteries are dilated, approximately the same size as the ascending aorta compatible with pulmonary arterial hypertension. There is mild cardiomegaly. No pleural or pericardial effusion. Calcific aortic and coronary atherosclerosis is identified. A pacing device in place. There is no axillary, hilar or mediastinal lymphadenopathy. The lungs show linear atelectatic change in the right base.  Visualized upper abdomen demonstrates no focal abnormality. No lytic or sclerotic bony lesion is seen.  Review of the MIP images confirms the above findings.  IMPRESSION: Negative for pulmonary embolus.  Mild cardiomegaly.  Calcific aortic and coronary atherosclerosis.  Findings compatible with pulmonary arterial hypertension.   Electronically Signed   By: Inge Rise M.D.   On: 12/11/2014 16:07  ASSESSMENT / PLAN:  Exertional dyspnea/hypoxia Acute Decompensated Diastolic HF Pulmonary HTN by CT Deconditioning Nocturnal desaturation  ?obstructive lung disease   Discussion Suspect that her symptom burden can most-likely be explained still by decompensated HF. She does have a 20pk/yr h/o smoking but it seems unusual for her to suddenly developed obstructive lung disease > 20 yrs after smoking cessation. Wonder about  secondary PAH, although estimated CVP of 3 on echo would argue against this.   Recommendations  - PFT's pending  - For R and L heart cath per cards 4/14 - Continue diuresis per cards as tol  - Nocturnal desat study ordered and pending - No role steroids at this time  - Ambulatory desat after cath is complete - Will likely need home o2 - Will hold off on BDs for now  Discussed with Dr. Acie Fredrickson.  Will follow from a distance and await results of cath, PFT's.   Nickolas Madrid, NP 12/13/2014  9:34 AM Pager: [phone] or [phone]  Patient seen and examined.  Remains with crackles bibasilarly.  I reviewed echo results myself, EF ok but unable to determine pulmonary artery pressure.    Exertional dyspnea/hypoxemia: Continue supplemental O2, diureses and for a cardiac cath in AM, perform PFTs today, ordered. CHF, diastolic: per cards. Pulmonary HTN: I reviewed the CT again myself, pulmonary arteries very enlarged suggesting pulmonary HTN, will perform a right heart cath today to determine that. Nocturnal desaturation: stable, sat down to 93% while asleep. Persistent hypoxemia: ambulatory desaturation study ordered, needs to be on RA, will re-order post cath. Acute and persistent pulmonary edema: lasix as ordered. Hold of BD and steroids for now.  Patient seen and examined, agree with above note.  I dictated the care and orders written for this patient under my direction.  Rush Farmer, MD [phone]

## 2014-12-13 NOTE — Progress Notes (Signed)
Patient Name: [name] Date of Encounter: 12/13/2014     Principal Problem:   Accelerated hypertension with diastolic congestive heart failure, NYHA class 3 Active Problems:   CHB (complete heart block)   Pacemaker - dual chamber St Jude, 2009   Essential hypertension   Normal coronary arteries angiography 2002   Hyperlipidemia   Acute congestive heart failure   Congestive heart disease   Dyspnea   SOB (shortness of breath)   Sleep apnea   Hypoxemia   Acute respiratory failure with hypoxemia   Acute pulmonary edema   Pulmonary hypertension    SUBJECTIVE  She is feeling the same. No real improvement in breathing although she does "feel more awake this AM." Waiting for PFTs today.   CURRENT MEDS . aspirin EC  81 mg Oral Daily  . atenolol  25 mg Oral Daily  . furosemide  40 mg Intravenous Daily  . gabapentin  300 mg Oral TID  . heparin  5,000 Units Subcutaneous 3 times per day  . insulin aspart  0-15 Units Subcutaneous TID WC  . insulin aspart  0-5 Units Subcutaneous QHS  . lisinopril  5 mg Oral Daily  . metFORMIN  500 mg Oral Q supper  . potassium chloride  20 mEq Oral Daily  . pravastatin  40 mg Oral Daily  . sodium chloride  3 mL Intravenous Q12H    OBJECTIVE  Filed Vitals:   12/12/14 1008 12/12/14 1530 12/12/14 2040 12/13/14 0549  BP: 125/64 127/44 128/52 116/39  Pulse: 74 69 70 70  Temp:  98.6 F (37 C) 99 F (37.2 C) 98.1 F (36.7 C)  TempSrc:  Oral Oral Oral  Resp:  16 16 16   Height:      Weight:    206 lb 5.6 oz (93.6 kg)  SpO2:  96% 96% 96%    Intake/Output Summary (Last 24 hours) at 12/13/14 0756 Last data filed at 12/13/14 0635  Gross per 24 hour  Intake   1070 ml  Output    450 ml  Net    620 ml   Filed Weights   12/11/14 0715 12/12/14 0639 12/13/14 0549  Weight: 205 lb (92.987 kg) 205 lb 9.6 oz (93.26 kg) 206 lb 5.6 oz (93.6 kg)    PHYSICAL EXAM  General: Pleasant, NAD. Obese. Appears comfortable on 02. Neuro: Alert and  oriented X 3. Moves all extremities spontaneously. Psych: Normal affect. HEENT:  Normal  Neck: Supple without bruits or JVD. Lungs:  Resp regular and unlabored, CTA. Heart: RRR no s3, s4, or murmurs. Abdomen: Soft, non-tender, non-distended, BS + x 4.  Extremities: No clubbing, cyanosis or edema. DP/PT/Radials 2+ and equal bilaterally.  Accessory Clinical Findings  CBC  Recent Labs  12/12/14 0530 12/13/14 0340  WBC 7.6 8.5  HGB 12.9 13.2  HCT 41.4 42.7  MCV 93.0 93.8  PLT 216 416   Basic Metabolic Panel  Recent Labs  12/12/14 0530 12/13/14 0340  NA 136 136  K 4.7 5.0  CL 95* 97  CO2 33* 30  GLUCOSE 123* 109*  BUN 19 24*  CREATININE 0.64 0.59  CALCIUM 8.9 8.8     TELE Rhythm is atrial sensed ventricular paced.  Radiology/Studies  Dg Chest 2 View  12/09/2014   CLINICAL DATA:  Dyspnea  EXAM: CHEST  2 VIEW  COMPARISON:  12/06/2014  FINDINGS: Cardiomegaly again noted. Central mild vascular congestion without pulmonary edema. There is improvement in aeration. No segmental infiltrate. Dual lead cardiac pacemaker  is unchanged in position.  IMPRESSION: Improvement in aeration. Cardiomegaly again noted. Central mild vascular contrast without pulmonary edema. No segmental infiltrate. Stable dual lead cardiac pacemaker position.   Electronically Signed   By: Lahoma Crocker M.D.   On: 12/09/2014 13:05   Ct Angio Chest Pe W/cm &/or Wo Cm  12/11/2014   CLINICAL DATA:  Dyspnea since 12/06/2014.  EXAM: CT ANGIOGRAPHY CHEST WITH CONTRAST  TECHNIQUE: Multidetector CT imaging of the chest was performed using the standard protocol during bolus administration of intravenous contrast. Multiplanar CT image reconstructions and MIPs were obtained to evaluate the vascular anatomy.  CONTRAST:  80 mL OMNIPAQUE IOHEXOL 350 MG/ML SOLN  COMPARISON:  Plain films of the chest 12/09/2014.  FINDINGS: No pulmonary embolus is identified. The right and left main pulmonary arteries are dilated, approximately the  same size as the ascending aorta compatible with pulmonary arterial hypertension. There is mild cardiomegaly. No pleural or pericardial effusion. Calcific aortic and coronary atherosclerosis is identified. A pacing device in place. There is no axillary, hilar or mediastinal lymphadenopathy. The lungs show linear atelectatic change in the right base.  Visualized upper abdomen demonstrates no focal abnormality. No lytic or sclerotic bony lesion is seen.  Review of the MIP images confirms the above findings.  IMPRESSION: Negative for pulmonary embolus.  Mild cardiomegaly.  Calcific aortic and coronary atherosclerosis.  Findings compatible with pulmonary arterial hypertension.   Electronically Signed   By: Inge Rise M.D.   On: 12/11/2014 16:07   Dg Chest Port 1 View  12/06/2014   CLINICAL DATA:  Shortness of Breath  EXAM: PORTABLE CHEST - 1 VIEW  COMPARISON:  February 27, 2012  FINDINGS: There is underlying emphysematous change. There is cardiomegaly with pulmonary venous hypertension and interstitial edema. There is no airspace consolidation. Pacemaker leads are attached to the right atrium and right ventricle. No adenopathy. There is postoperative change in the lower cervical spine.  IMPRESSION: Congestive heart failure superimposed on emphysematous change. No airspace consolidation.   Electronically Signed   By: Lowella Grip III M.D.   On: 12/06/2014 12:59   2D echo 12/07/14 Study Conclusions - Left ventricle: The cavity size was normal. Wall thickness was increased in a pattern of mild LVH. Systolic function was normal. The estimated ejection fraction was in the range of 50% to 55%. Wall motion was normal; there were no regional wall motion abnormalities. The study is not technically sufficient to allow evaluation of LV diastolic function. - Ventricular septum: Septal motion showed abnormal function and dyssynergy. - Aortic valve: There was trivial regurgitation. - Mitral valve:  Calcified annulus. There was mild regurgitation. - Left atrium: The atrium was mildly dilated. Impressions: - Normal to mild global reduction in LV function (EF 50); mild LAE; mild MR; trace TR.   ASSESSMENT AND PLAN  74 yo female w/ hx HTN, HL, DM, St Jude PPM for CHB, nl cors '02, nl EF, admitted 04/06 w/ SOB and CHF.   Acute Diastolic CHF: EF normal on echo at 50-55%. BNP 599 on admission. CXR with pulm edema.  -- Net neg 2.41L and weight down 8lbs ( 214--> 206 lbs). Switched to PO Lasix on 12/10/14 and placed on 40mg  po qd. Repeat CXR with improved aeration without pulmonary edema or segmental infiltrate. BNP improved. -- Patient with continued hypoxia and dyspnea despite adequate diuresis. PCCM was asked to see to assist w/ evaluation of the etiology of her dyspnea and hypoxia, which seemed out of proportion to imaging and laboratory  exam. They performed CTA which was negative for PE but did show some pulmonary HTN. Nocturnal oximetry to look for desaturation at HS as she is at risk for central sleep apnea returned normal while on 02 (93-100% overnight). They also have recommended to continue aggressive diuresis w/ goal negative fluid balance. We placed her back on 40mg  IV lasix, although it looks like her I/Os are positive and weight actually up one pound from yesterday. Continued IV lasix for now. Spoke with PCCM PA who saw this AM. They are working on obtaining PFTs today. She may need a right heart cath if we do not find an etiology on PFTs. There was also some discussion about a Bi-V pacer.  Will leave that up to Dr. Loletha Grayer.   HTN: well controlled. Continue atenolol 25mg , lasix 40mg  qd and lisinopril 5mg   DM- continue home regimen  HLD- cont statin.  Deconditioning- PT recommending SNF. Patient and family do not want to go to SNF and want inpatient rehab, however, she does not qualify and there are no beds availble. (Her husband recently died in a SNF)   Signed, Eileen Stanford  PA-C  Pager [phone]  Attending Note:   The patient was seen and examined.  Agree with assessment and plan as noted above.  Changes made to the above note as needed.  Will try to get PFTs today Will go ahead and schedule her for cath tomorrow.  She will definitely need a right heart cath. I think it would be reasonable to do a left heart also to make sure that we are not missing anything (assuming that the PFTs do not show anything)    Thayer Headings, Brooke Bonito., MD, [facility] 12/13/2014, 9:06 AM [address]  [place] Pager [phone]

## 2014-12-13 NOTE — Progress Notes (Signed)
CSW and RNCM- [name] met with patient and her daughter this afternoon to discuss d/c plan. Physical Therapy is recommending SNF and bed is available at [facility] and other SNF's. Patient and daughter adamantly refuse this option and plan to return home with home health. They had hoped for CIR placement but this option is not open at this time.  Family will provide 24 hour care at home and they want home health with [facility].  CSW provided support to patient and daughter; will sign off but available to assist in future if needed.  Daughter was very appreciative of our visits but stated that would not accept SNF placement.  [name]. [name], [place]

## 2014-12-13 NOTE — Progress Notes (Signed)
i cosign all documentation and medication administration by Tina Griffiths, student RN for this shift.

## 2014-12-13 NOTE — Progress Notes (Signed)
Pt O2 sat 93-100 overnight will sleeping.

## 2014-12-14 ENCOUNTER — Inpatient Hospital Stay (HOSPITAL_COMMUNITY): Payer: Medicare Other

## 2014-12-14 ENCOUNTER — Encounter (HOSPITAL_COMMUNITY): Payer: Self-pay | Admitting: Cardiology

## 2014-12-14 ENCOUNTER — Encounter (HOSPITAL_COMMUNITY)
Admission: EM | Disposition: A | Discharge status: Home / Self Care | Payer: Self-pay | Source: Home / Self Care | Attending: Cardiovascular Disease

## 2014-12-14 DIAGNOSIS — I2510 Atherosclerotic heart disease of native coronary artery without angina pectoris: Secondary | ICD-10-CM

## 2014-12-14 DIAGNOSIS — E119 Type 2 diabetes mellitus without complications: Secondary | ICD-10-CM

## 2014-12-14 HISTORY — PX: LEFT AND RIGHT HEART CATHETERIZATION WITH CORONARY ANGIOGRAM: SHX5449

## 2014-12-14 LAB — BASIC METABOLIC PANEL
ANION GAP: 8 (ref 5–15)
BUN: 28 mg/dL — ABNORMAL HIGH (ref 6–23)
CO2: 36 mmol/L — ABNORMAL HIGH (ref 19–32)
CREATININE: 0.8 mg/dL (ref 0.50–1.10)
Calcium: 9 mg/dL (ref 8.4–10.5)
Chloride: 94 mmol/L — ABNORMAL LOW (ref 96–112)
GFR, EST AFRICAN AMERICAN: 82 mL/min — AB (ref >90)
GFR, EST NON AFRICAN AMERICAN: 70 mL/min — AB (ref >90)
Glucose, Bld: 123 mg/dL — ABNORMAL HIGH (ref 70–99)
POTASSIUM: 4.9 mmol/L (ref 3.5–5.1)
Sodium: 138 mmol/L (ref 135–145)

## 2014-12-14 LAB — PULMONARY FUNCTION TEST
DL/VA % PRED: 77 %
DL/VA: 3.62 ml/min/mmHg/L
DLCO COR % PRED: 31 %
DLCO COR: 7.12 ml/min/mmHg
DLCO UNC % PRED: 30 %
DLCO unc: 7.06 ml/min/mmHg
FEF 25-75 PRE: 0.48 L/s
FEF 25-75 Post: 0.42 L/sec
FEF2575-%CHANGE-POST: -12 %
FEF2575-%Pred-Post: 26 %
FEF2575-%Pred-Pre: 29 %
FEV1-%Change-Post: -4 %
FEV1-%PRED-PRE: 53 %
FEV1-%Pred-Post: 50 %
FEV1-POST: 1.01 L
FEV1-PRE: 1.06 L
FEV1FVC-%CHANGE-POST: 4 %
FEV1FVC-%PRED-PRE: 75 %
FEV6-%Change-Post: -8 %
FEV6-%PRED-POST: 66 %
FEV6-%Pred-Pre: 72 %
FEV6-PRE: 1.83 L
FEV6-Post: 1.69 L
FEV6FVC-%Change-Post: 1 %
FEV6FVC-%Pred-Post: 104 %
FEV6FVC-%Pred-Pre: 103 %
FVC-%Change-Post: -9 %
FVC-%PRED-POST: 63 %
FVC-%Pred-Pre: 69 %
FVC-POST: 1.7 L
FVC-Pre: 1.87 L
POST FEV1/FVC RATIO: 59 %
POST FEV6/FVC RATIO: 99 %
Pre FEV1/FVC ratio: 57 %
Pre FEV6/FVC Ratio: 98 %
RV % pred: 141 %
RV: 3.17 L
TLC % pred: 101 %
TLC: 4.98 L

## 2014-12-14 LAB — POCT I-STAT 3, VENOUS BLOOD GAS (G3P V)
Acid-Base Excess: 2 mmol/L (ref 0.0–2.0)
Bicarbonate: 30.7 meq/L — ABNORMAL HIGH (ref 20.0–24.0)
O2 Saturation: 68 %
TCO2: 33 mmol/L (ref 0–100)
pCO2, Ven: 63.2 mmHg — ABNORMAL HIGH (ref 45.0–50.0)
pH, Ven: 7.295 (ref 7.250–7.300)
pO2, Ven: 40 mmHg (ref 30.0–45.0)

## 2014-12-14 LAB — POCT I-STAT 3, ART BLOOD GAS (G3+)
ACID-BASE EXCESS: 4 mmol/L — AB (ref 0.0–2.0)
Bicarbonate: 31.8 mEq/L — ABNORMAL HIGH (ref 20.0–24.0)
O2 SAT: 92 %
PCO2 ART: 63.9 mmHg — AB (ref 35.0–45.0)
PH ART: 7.305 — AB (ref 7.350–7.450)
TCO2: 34 mmol/L (ref 0–100)
pO2, Arterial: 73 mmHg — ABNORMAL LOW (ref 80.0–100.0)

## 2014-12-14 LAB — GLUCOSE, CAPILLARY
GLUCOSE-CAPILLARY: 114 mg/dL — AB (ref 70–99)
GLUCOSE-CAPILLARY: 138 mg/dL — AB (ref 70–99)
Glucose-Capillary: 130 mg/dL — ABNORMAL HIGH (ref 70–99)
Glucose-Capillary: 101 mg/dL — ABNORMAL HIGH (ref 70–99)
Glucose-Capillary: 108 mg/dL — ABNORMAL HIGH (ref 70–99)

## 2014-12-14 LAB — CBC
HEMATOCRIT: 41.6 % (ref 36.0–46.0)
HEMOGLOBIN: 13.1 g/dL (ref 12.0–15.0)
MCH: 29.2 pg (ref 26.0–34.0)
MCHC: 31.5 g/dL (ref 30.0–36.0)
MCV: 92.9 fL (ref 78.0–100.0)
Platelets: 207 10*3/uL (ref 150–400)
RBC: 4.48 MIL/uL (ref 3.87–5.11)
RDW: 13.8 % (ref 11.5–15.5)
WBC: 7.2 10*3/uL (ref 4.0–10.5)

## 2014-12-14 LAB — PROTIME-INR
INR: 1 (ref 0.00–1.49)
PROTHROMBIN TIME: 13.3 s (ref 11.6–15.2)

## 2014-12-14 SURGERY — LEFT AND RIGHT HEART CATHETERIZATION WITH CORONARY ANGIOGRAM

## 2014-12-14 MED ORDER — LIDOCAINE HCL (PF) 1 % IJ SOLN
INTRAMUSCULAR | Status: AC
  Filled 2014-12-14: qty 30

## 2014-12-14 MED ORDER — HEPARIN (PORCINE) IN NACL 2-0.9 UNIT/ML-% IJ SOLN
INTRAMUSCULAR | Status: AC
  Filled 2014-12-14: qty 1000

## 2014-12-14 MED ORDER — NITROGLYCERIN 1 MG/10 ML FOR IR/CATH LAB
INTRA_ARTERIAL | Status: AC
  Filled 2014-12-14: qty 10

## 2014-12-14 MED ORDER — HEPARIN SODIUM (PORCINE) 5000 UNIT/ML IJ SOLN
5000.0000 [IU] | Freq: Three times a day (TID) | INTRAMUSCULAR | Status: DC
  Administered 2014-12-15: 5000 [IU] via SUBCUTANEOUS
  Filled 2014-12-14 (×4): qty 1

## 2014-12-14 MED ORDER — VERAPAMIL HCL 2.5 MG/ML IV SOLN
INTRAVENOUS | Status: AC
  Filled 2014-12-14: qty 2

## 2014-12-14 MED ORDER — FENTANYL CITRATE 0.05 MG/ML IJ SOLN
INTRAMUSCULAR | Status: AC
  Filled 2014-12-14: qty 2

## 2014-12-14 MED ORDER — HEPARIN SODIUM (PORCINE) 1000 UNIT/ML IJ SOLN
INTRAMUSCULAR | Status: AC
  Filled 2014-12-14: qty 1

## 2014-12-14 MED ORDER — MIDAZOLAM HCL 2 MG/2ML IJ SOLN
INTRAMUSCULAR | Status: AC
  Filled 2014-12-14: qty 2

## 2014-12-14 MED ORDER — METFORMIN HCL ER 500 MG PO TB24
500.0000 mg | ORAL_TABLET | Freq: Every day | ORAL | Status: DC
  Administered 2014-12-15: 500 mg via ORAL
  Filled 2014-12-14 (×2): qty 1

## 2014-12-14 MED ORDER — ALBUTEROL SULFATE (2.5 MG/3ML) 0.083% IN NEBU: 2.5000 mg | INHALATION_SOLUTION | Freq: Once | RESPIRATORY_TRACT | Status: DC

## 2014-12-14 MED ORDER — SODIUM CHLORIDE 0.9 % IV SOLN: 1.0000 mL/kg/h | INTRAVENOUS | Status: AC

## 2014-12-14 NOTE — H&P (View-Only) (Signed)
Patient Name: [name] Date of Encounter: 12/14/2014     Principal Problem:   Accelerated hypertension with diastolic congestive heart failure, NYHA class 3 Active Problems:   CHB (complete heart block)   Pacemaker - dual chamber St Jude, 2009   Essential hypertension   Normal coronary arteries angiography 2002   Hyperlipidemia   Acute congestive heart failure   Congestive heart disease   Dyspnea   SOB (shortness of breath)   Sleep apnea   Hypoxemia   Acute respiratory failure with hypoxemia   Acute pulmonary edema   Pulmonary hypertension    SUBJECTIVE  She is feeling the same. No real improvement in breathing although she does "feel more awake this AM." PFT show non reactive lung disease.  DLCO is suggestive of pulmonary edema    CURRENT MEDS . albuterol  2.5 mg Nebulization Once  . [START ON 12/15/2014] aspirin EC  81 mg Oral Daily  . atenolol  25 mg Oral Daily  . furosemide  40 mg Intravenous Daily  . gabapentin  300 mg Oral TID  . heparin  5,000 Units Subcutaneous 3 times per day  . insulin aspart  0-15 Units Subcutaneous TID WC  . insulin aspart  0-5 Units Subcutaneous QHS  . lisinopril  5 mg Oral Daily  . metFORMIN  500 mg Oral Q supper  . potassium chloride  20 mEq Oral Daily  . pravastatin  40 mg Oral Daily  . sodium chloride  3 mL Intravenous Q12H  . sodium chloride  3 mL Intravenous Q12H    OBJECTIVE  Filed Vitals:   12/13/14 0549 12/13/14 1038 12/13/14 1458 12/13/14 2038  BP: 116/39 145/44 108/49 113/55  Pulse: 70 74 70 70  Temp: 98.1 F (36.7 C)  98.3 F (36.8 C) 98.6 F (37 C)  TempSrc: Oral  Oral Oral  Resp: 16  16 16   Height:      Weight: 206 lb 5.6 oz (93.6 kg)     SpO2: 96%  95% 100%    Intake/Output Summary (Last 24 hours) at 12/14/14 1145 Last data filed at 12/14/14 0923  Gross per 24 hour  Intake    800 ml  Output    725 ml  Net     75 ml   Filed Weights   12/11/14 0715 12/12/14 0639 12/13/14 0549  Weight: 205 lb  (92.987 kg) 205 lb 9.6 oz (93.26 kg) 206 lb 5.6 oz (93.6 kg)    PHYSICAL EXAM  General: Pleasant, NAD. Obese. Appears comfortable on 02. Neuro: Alert and oriented X 3. Moves all extremities spontaneously. Psych: Normal affect. HEENT:  Normal  Neck: Supple without bruits or JVD. Lungs:  Resp regular and unlabored, CTA. Heart: RRR no s3, s4, or murmurs. Abdomen: Soft, non-tender, non-distended, BS + x 4.  Extremities: No clubbing, cyanosis or edema. DP/PT/Radials 2+ and equal bilaterally.  Accessory Clinical Findings  CBC  Recent Labs  12/13/14 0340 12/14/14 0535  WBC 8.5 7.2  HGB 13.2 13.1  HCT 42.7 41.6  MCV 93.8 92.9  PLT 211 502   Basic Metabolic Panel  Recent Labs  12/13/14 0340 12/14/14 0535  NA 136 138  K 5.0 4.9  CL 97 94*  CO2 30 36*  GLUCOSE 109* 123*  BUN 24* 28*  CREATININE 0.59 0.80  CALCIUM 8.8 9.0     TELE Rhythm is atrial sensed ventricular paced.  Radiology/Studies  Dg Chest 2 View  12/09/2014   CLINICAL DATA:  Dyspnea  EXAM:  CHEST  2 VIEW  COMPARISON:  12/06/2014  FINDINGS: Cardiomegaly again noted. Central mild vascular congestion without pulmonary edema. There is improvement in aeration. No segmental infiltrate. Dual lead cardiac pacemaker is unchanged in position.  IMPRESSION: Improvement in aeration. Cardiomegaly again noted. Central mild vascular contrast without pulmonary edema. No segmental infiltrate. Stable dual lead cardiac pacemaker position.   Electronically Signed   By: Lahoma Crocker M.D.   On: 12/09/2014 13:05   Ct Angio Chest Pe W/cm &/or Wo Cm  12/11/2014   CLINICAL DATA:  Dyspnea since 12/06/2014.  EXAM: CT ANGIOGRAPHY CHEST WITH CONTRAST  TECHNIQUE: Multidetector CT imaging of the chest was performed using the standard protocol during bolus administration of intravenous contrast. Multiplanar CT image reconstructions and MIPs were obtained to evaluate the vascular anatomy.  CONTRAST:  80 mL OMNIPAQUE IOHEXOL 350 MG/ML SOLN   COMPARISON:  Plain films of the chest 12/09/2014.  FINDINGS: No pulmonary embolus is identified. The right and left main pulmonary arteries are dilated, approximately the same size as the ascending aorta compatible with pulmonary arterial hypertension. There is mild cardiomegaly. No pleural or pericardial effusion. Calcific aortic and coronary atherosclerosis is identified. A pacing device in place. There is no axillary, hilar or mediastinal lymphadenopathy. The lungs show linear atelectatic change in the right base.  Visualized upper abdomen demonstrates no focal abnormality. No lytic or sclerotic bony lesion is seen.  Review of the MIP images confirms the above findings.  IMPRESSION: Negative for pulmonary embolus.  Mild cardiomegaly.  Calcific aortic and coronary atherosclerosis.  Findings compatible with pulmonary arterial hypertension.   Electronically Signed   By: Inge Rise M.D.   On: 12/11/2014 16:07   Dg Chest Port 1 View  12/06/2014   CLINICAL DATA:  Shortness of Breath  EXAM: PORTABLE CHEST - 1 VIEW  COMPARISON:  February 27, 2012  FINDINGS: There is underlying emphysematous change. There is cardiomegaly with pulmonary venous hypertension and interstitial edema. There is no airspace consolidation. Pacemaker leads are attached to the right atrium and right ventricle. No adenopathy. There is postoperative change in the lower cervical spine.  IMPRESSION: Congestive heart failure superimposed on emphysematous change. No airspace consolidation.   Electronically Signed   By: Lowella Grip III M.D.   On: 12/06/2014 12:59   2D echo 12/07/14 Study Conclusions - Left ventricle: The cavity size was normal. Wall thickness was increased in a pattern of mild LVH. Systolic function was normal. The estimated ejection fraction was in the range of 50% to 55%. Wall motion was normal; there were no regional wall motion abnormalities. The study is not technically sufficient to allow evaluation of LV  diastolic function. - Ventricular septum: Septal motion showed abnormal function and dyssynergy. - Aortic valve: There was trivial regurgitation. - Mitral valve: Calcified annulus. There was mild regurgitation. - Left atrium: The atrium was mildly dilated. Impressions: - Normal to mild global reduction in LV function (EF 50); mild LAE; mild MR; trace TR.   ASSESSMENT AND PLAN  74 yo female w/ hx HTN, HL, DM, St Jude PPM for CHB, nl cors '02, nl EF, admitted 04/06 w/ SOB and CHF.   Acute Diastolic CHF: EF normal on echo at 50-55%. BNP 599 on admission. CXR with pulm edema.  We have continued diuresis. For R and L heart cath today    HTN: well controlled. Continue atenolol 25mg , lasix 40mg  qd and lisinopril 5mg   DM- continue home regimen  HLD- cont statin.  Deconditioning- PT recommending SNF. Patient  and family do not want to go to SNF and want inpatient rehab, however, she does not qualify and there are no beds availble. (Her husband recently died in a SNF)  Ramond Dial., MD, [facility] 12/14/2014, 11:47 AM [address]  [place] Pager [phone]

## 2014-12-14 NOTE — CV Procedure (Signed)
    Cardiac Catheterization Procedure Note  Name: [name] MRN: [id number] DOB: 1941/05/01  Procedure: Right Heart Cath, Left Heart Cath, Selective Coronary Angiography, LV angiography  Indication: 74 yo WF with acute respiratory failure and pulmonary edema.    Procedural Details: The right wrist was prepped, draped, and anesthetized with 1% lidocaine. Using the modified Seldinger technique a 6 Fr slender sheath was placed in the right radial artery. The right groin was prepped, draped, and anesthetized with 1% lidocaine and a 7 French sheath was placed in the right brachial vein. A Swan-Ganz catheter was used for the right heart catheterization. Standard protocol was followed for recording of right heart pressures and sampling of oxygen saturations. Fick cardiac output was calculated. Standard Judkins catheters were used for selective coronary angiography and left ventriculography. There were no immediate procedural complications. The patient was transferred to the post catheterization recovery area for further monitoring.  Procedural Findings: Hemodynamics RA 8/5 mean 4 mm Hg RV 38/5/7 mm Hg PA 38/16 mean 24 mm Hg  PCWP 12/10 mean 10 mm Hg LV 144/14 mm Hg AO 138/59 mean 86 mm Hg  Oxygen saturations: PA 68% AO 92%  Cardiac Output (Fick) 6.25 L/min  Cardiac Index (Fick) 3.1 L/min/meter squared.    Coronary angiography: Coronary dominance: right  Left mainstem: Normal  Left anterior descending (LAD): 30% stenosis at the takeoff of the first diagonal. Otherwise normal.  Left circumflex (LCx): Normal  Right coronary artery (RCA): Normal.   Left ventriculography: Left ventricular systolic function is normal, LVEF is estimated at 55-65%, there is no significant mitral regurgitation   Final Conclusions:   1. No significant CAD 2. Normal LV function.  3. Normal right heart pressures and low to normal LV filling pressures.   Recommendations: Medical management. Will DC IV  lasix. Consider adding lower dose oral lasix in am.    Peter Martinique, [place] 12/14/2014, 1:29 PM

## 2014-12-14 NOTE — Progress Notes (Signed)
Patient Name: [name] Date of Encounter: 12/14/2014     Principal Problem:   Accelerated hypertension with diastolic congestive heart failure, NYHA class 3 Active Problems:   CHB (complete heart block)   Pacemaker - dual chamber St Jude, 2009   Essential hypertension   Normal coronary arteries angiography 2002   Hyperlipidemia   Acute congestive heart failure   Congestive heart disease   Dyspnea   SOB (shortness of breath)   Sleep apnea   Hypoxemia   Acute respiratory failure with hypoxemia   Acute pulmonary edema   Pulmonary hypertension    SUBJECTIVE  She is feeling the same. No real improvement in breathing although she does "feel more awake this AM." PFT show non reactive lung disease.  DLCO is suggestive of pulmonary edema    CURRENT MEDS . albuterol  2.5 mg Nebulization Once  . [START ON 12/15/2014] aspirin EC  81 mg Oral Daily  . atenolol  25 mg Oral Daily  . furosemide  40 mg Intravenous Daily  . gabapentin  300 mg Oral TID  . heparin  5,000 Units Subcutaneous 3 times per day  . insulin aspart  0-15 Units Subcutaneous TID WC  . insulin aspart  0-5 Units Subcutaneous QHS  . lisinopril  5 mg Oral Daily  . metFORMIN  500 mg Oral Q supper  . potassium chloride  20 mEq Oral Daily  . pravastatin  40 mg Oral Daily  . sodium chloride  3 mL Intravenous Q12H  . sodium chloride  3 mL Intravenous Q12H    OBJECTIVE  Filed Vitals:   12/13/14 0549 12/13/14 1038 12/13/14 1458 12/13/14 2038  BP: 116/39 145/44 108/49 113/55  Pulse: 70 74 70 70  Temp: 98.1 F (36.7 C)  98.3 F (36.8 C) 98.6 F (37 C)  TempSrc: Oral  Oral Oral  Resp: 16  16 16   Height:      Weight: 206 lb 5.6 oz (93.6 kg)     SpO2: 96%  95% 100%    Intake/Output Summary (Last 24 hours) at 12/14/14 1145 Last data filed at 12/14/14 0923  Gross per 24 hour  Intake    800 ml  Output    725 ml  Net     75 ml   Filed Weights   12/11/14 0715 12/12/14 0639 12/13/14 0549  Weight: 205 lb  (92.987 kg) 205 lb 9.6 oz (93.26 kg) 206 lb 5.6 oz (93.6 kg)    PHYSICAL EXAM  General: Pleasant, NAD. Obese. Appears comfortable on 02. Neuro: Alert and oriented X 3. Moves all extremities spontaneously. Psych: Normal affect. HEENT:  Normal  Neck: Supple without bruits or JVD. Lungs:  Resp regular and unlabored, CTA. Heart: RRR no s3, s4, or murmurs. Abdomen: Soft, non-tender, non-distended, BS + x 4.  Extremities: No clubbing, cyanosis or edema. DP/PT/Radials 2+ and equal bilaterally.  Accessory Clinical Findings  CBC  Recent Labs  12/13/14 0340 12/14/14 0535  WBC 8.5 7.2  HGB 13.2 13.1  HCT 42.7 41.6  MCV 93.8 92.9  PLT 211 885   Basic Metabolic Panel  Recent Labs  12/13/14 0340 12/14/14 0535  NA 136 138  K 5.0 4.9  CL 97 94*  CO2 30 36*  GLUCOSE 109* 123*  BUN 24* 28*  CREATININE 0.59 0.80  CALCIUM 8.8 9.0     TELE Rhythm is atrial sensed ventricular paced.  Radiology/Studies  Dg Chest 2 View  12/09/2014   CLINICAL DATA:  Dyspnea  EXAM:  CHEST  2 VIEW  COMPARISON:  12/06/2014  FINDINGS: Cardiomegaly again noted. Central mild vascular congestion without pulmonary edema. There is improvement in aeration. No segmental infiltrate. Dual lead cardiac pacemaker is unchanged in position.  IMPRESSION: Improvement in aeration. Cardiomegaly again noted. Central mild vascular contrast without pulmonary edema. No segmental infiltrate. Stable dual lead cardiac pacemaker position.   Electronically Signed   By: Lahoma Crocker M.D.   On: 12/09/2014 13:05   Ct Angio Chest Pe W/cm &/or Wo Cm  12/11/2014   CLINICAL DATA:  Dyspnea since 12/06/2014.  EXAM: CT ANGIOGRAPHY CHEST WITH CONTRAST  TECHNIQUE: Multidetector CT imaging of the chest was performed using the standard protocol during bolus administration of intravenous contrast. Multiplanar CT image reconstructions and MIPs were obtained to evaluate the vascular anatomy.  CONTRAST:  80 mL OMNIPAQUE IOHEXOL 350 MG/ML SOLN   COMPARISON:  Plain films of the chest 12/09/2014.  FINDINGS: No pulmonary embolus is identified. The right and left main pulmonary arteries are dilated, approximately the same size as the ascending aorta compatible with pulmonary arterial hypertension. There is mild cardiomegaly. No pleural or pericardial effusion. Calcific aortic and coronary atherosclerosis is identified. A pacing device in place. There is no axillary, hilar or mediastinal lymphadenopathy. The lungs show linear atelectatic change in the right base.  Visualized upper abdomen demonstrates no focal abnormality. No lytic or sclerotic bony lesion is seen.  Review of the MIP images confirms the above findings.  IMPRESSION: Negative for pulmonary embolus.  Mild cardiomegaly.  Calcific aortic and coronary atherosclerosis.  Findings compatible with pulmonary arterial hypertension.   Electronically Signed   By: Inge Rise M.D.   On: 12/11/2014 16:07   Dg Chest Port 1 View  12/06/2014   CLINICAL DATA:  Shortness of Breath  EXAM: PORTABLE CHEST - 1 VIEW  COMPARISON:  February 27, 2012  FINDINGS: There is underlying emphysematous change. There is cardiomegaly with pulmonary venous hypertension and interstitial edema. There is no airspace consolidation. Pacemaker leads are attached to the right atrium and right ventricle. No adenopathy. There is postoperative change in the lower cervical spine.  IMPRESSION: Congestive heart failure superimposed on emphysematous change. No airspace consolidation.   Electronically Signed   By: Lowella Grip III M.D.   On: 12/06/2014 12:59   2D echo 12/07/14 Study Conclusions - Left ventricle: The cavity size was normal. Wall thickness was increased in a pattern of mild LVH. Systolic function was normal. The estimated ejection fraction was in the range of 50% to 55%. Wall motion was normal; there were no regional wall motion abnormalities. The study is not technically sufficient to allow evaluation of LV  diastolic function. - Ventricular septum: Septal motion showed abnormal function and dyssynergy. - Aortic valve: There was trivial regurgitation. - Mitral valve: Calcified annulus. There was mild regurgitation. - Left atrium: The atrium was mildly dilated. Impressions: - Normal to mild global reduction in LV function (EF 50); mild LAE; mild MR; trace TR.   ASSESSMENT AND PLAN  74 yo female w/ hx HTN, HL, DM, St Jude PPM for CHB, nl cors '02, nl EF, admitted 04/06 w/ SOB and CHF.   Acute Diastolic CHF: EF normal on echo at 50-55%. BNP 599 on admission. CXR with pulm edema.  We have continued diuresis. For R and L heart cath today    HTN: well controlled. Continue atenolol 25mg , lasix 40mg  qd and lisinopril 5mg   DM- continue home regimen  HLD- cont statin.  Deconditioning- PT recommending SNF. Patient  and family do not want to go to SNF and want inpatient rehab, however, she does not qualify and there are no beds availble. (Her husband recently died in a SNF)  Ramond Dial., MD, [facility] 12/14/2014, 11:47 AM [address],  [place] Pager [phone]

## 2014-12-14 NOTE — Progress Notes (Signed)
Name: [name] MRN: [id number] DOB: Mar 26, 1941    ADMISSION DATE:  12/06/2014 CONSULTATION DATE:  4/11  REFERRING MD :  [name]   CHIEF COMPLAINT:  Hypoxia   BRIEF PATIENT DESCRIPTION: 74 year old female w/ PMH HTN, diastolic dysfn, pacemaker dependent since 09 d/t CHB, w/ 20 pack year hx (stopped smoking 20 yrs ago). Admitted on 4/8 w/ working dx of decompensated diastolic HF and accelerated HTN. Treated w/ aggressive diuresis and BP control. PCCM asked to see on 4/11 for on-going exertional O2 dependence in spite of above rx.   SIGNIFICANT EVENTS  4/06  Admit with decompensated dCHF 4/14  R/L HC   STUDIES:  4/08  ECHO >> EF 50-55%; Wall thickness wasincreased in a pattern of mild LVH. Ventricular septum: Septal motion showed abnormal function anddyssynergy.  Aortic valve: There was trivial regurgitation.  Mitral valve: Calcified annulus. There was mild regurgitation.  Left atrium: The atrium was mildly dilated. 4/11  CT Chest  >> no pe +PAH 4/13  PFT >> consistent with obstructive, non reactive (no BD response) lung disease and DLCO suggestive of pulmonary edema.   4/14  R/L HC >>  SUBJECTIVE:   Remains on 2L O2, reports she is thirsty/hungry.  No acute events.  No dyspnea at rest   VITAL SIGNS: Temp:  [98.3 F (36.8 C)-98.6 F (37 C)] 98.6 F (37 C) (04/13 2038) Pulse Rate:  [70-74] 70 (04/13 2038) Resp:  [16] 16 (04/13 2038) BP: (108-145)/(44-55) 113/55 mmHg (04/13 2038) SpO2:  [95 %-100 %] 100 % (04/13 2038)  PHYSICAL EXAMINATION: General:  Chronically ill appearing female. NAD but easily dyspneic with talking, moving.  Neuro:  Awake, alert, no focal def  HEENT:  [place], no JVD  Cardiovascular:  rrr Lungs:  Resps even/non labored at rest, few basilar rales, no wheeze Abdomen:  NT, + bowel sounds Musculoskeletal:  intact Skin:  Only trace LE edema    Recent Labs Lab 12/12/14 0530 12/13/14 0340 12/14/14 0535  NA 136 136 138  K 4.7 5.0 4.9  CL 95* 97 94*   CO2 33* 30 36*  BUN 19 24* 28*  CREATININE 0.64 0.59 0.80  GLUCOSE 123* 109* 123*    Recent Labs Lab 12/12/14 0530 12/13/14 0340 12/14/14 0535  HGB 12.9 13.2 13.1  HCT 41.4 42.7 41.6  WBC 7.6 8.5 7.2  PLT 216 211 207   No results found.  ASSESSMENT / PLAN:  Exertional dyspnea/hypoxia Acute Decompensated Diastolic HF Pulmonary HTN by CT Deconditioning Persistent  Hypoxemia  Nocturnal desaturation  ?obstructive lung disease   Discussion Suspect that her symptom burden can most-likely be explained still by decompensated HF. She does have a 20pk/yr h/o smoking but it seems unusual for her to suddenly developed obstructive lung disease > 20 yrs after smoking cessation. Wonder about secondary PAH, although estimated CVP of 3 on echo would argue against this.  PFT's consistent with obstructive, non reactive (no BD response) lung disease and DLCO suggestive of pulmonary edema.    Recommendations  For R and L heart cath per cards 4/14 Continue diuresis per cards as tol  Nocturnal desat study ordered and pending No role steroids at this time  Ambulatory desat after cath is complete Will likely need home o2 Will hold off on BDs for now  Noe Gens, NP-C [facility] Pgr: [phone] or [phone]  PFTs completed.  I reviewed them myself and evidence of irreversible obstruction that is not moderate and would not explain  the degree of SOB.  DLCO is very low which can be explained by pulmonary edema associated with decompensated heart failure.  Going for a cardiac cath today with right and left heart.  Decompensated heart failure: cath today, continue diureses as BP and renal function allows.  Pul HTN: for a right heart cath to confirm this today.  If confirmed may consider treatment however, this is likely to be secondary pulmonary HTN related to left heart failure.  Hypoxemic respiratory failure: due to pulmonary edema.  Diureses as ordered.  Ambulatory  desaturation study to obtain home O2.  Obstructive lung disease: irreversible will d/c all bronchodilators.  Will f/u post cath.  Patient seen and examined, agree with above note.  I dictated the care and orders written for this patient under my direction.  Rush Farmer, MD [phone]    12/14/2014  8:48 AM

## 2014-12-14 NOTE — Interval H&P Note (Signed)
History and Physical Interval Note:  12/14/2014 12:43 PM  [name]  has presented today for surgery, with the diagnosis of CP  The various methods of treatment have been discussed with the patient and family. After consideration of risks, benefits and other options for treatment, the patient has consented to  Procedure(s): LEFT AND RIGHT HEART CATHETERIZATION WITH CORONARY ANGIOGRAM (N/A) as a surgical intervention .  The patient's history has been reviewed, patient examined, no change in status, stable for surgery.  I have reviewed the patient's chart and labs.  Questions were answered to the patient's satisfaction.   Cath Lab Visit (complete for each Cath Lab visit)  Clinical Evaluation Leading to the Procedure:   ACS: No.  Non-ACS:    Anginal Classification: CCS III  Anti-ischemic medical therapy: Minimal Therapy (1 class of medications)  Non-Invasive Test Results: No non-invasive testing performed  Prior CABG: No previous CABG        Collier Salina [facility] 12/14/2014 12:43 PM

## 2014-12-14 NOTE — Progress Notes (Signed)
Site area: rt fv sheath-rt groin Site Prior to Removal:  Level  0 Pressure Applied For:  20 minutes Manual:  Yes  Patient Status During Pull:  stable Post Pull Site:  Level  0 Post Pull Instructions Given:  yes Post Pull Pulses Present: yes Dressing Applied:  tegaderm Bedrest begins @  1425 Comments: 0 complications

## 2014-12-14 NOTE — Progress Notes (Signed)
Site area: rt ac venous sheath pulled and held by [employer] Prior to Removal:  Level 0 Pressure Applied For: 15 minutes Manual:   yes Patient Status During Pull:  0 Post Pull Site:  Level  0 Post Pull Instructions Given:  yes Post Pull Pulses Present yes   Dressing Applied:  tegaderm Bedrest begins @  Comments:

## 2014-12-14 NOTE — Progress Notes (Signed)
Physical Therapy Treatment Patient Details Name: [name] MRN: [id number] DOB: 1940/12/14 Today's Date: 12/14/2014    History of Present Illness The patient is a 74yo female with a history of HTN, HLD, DM,cervical fusion with resultant incontinence. She is pacemaker dependent with complete heart block since 2009.Admitted with SOB, leg weakness and fall at MD office, CHF    PT Comments    Pt continues with slow improvement in mobility, fatigues with gait and benefits from encouragement for HEP. Pt with assist to don/doff brief for ambulation today and can be impulsive with need for cues for safety. Son and dgtr present and state they are agreeable with home and working on plan for assist at home.   Follow Up Recommendations  SNF;Supervision for mobility/OOB     Equipment Recommendations       Recommendations for Other Services       Precautions / Restrictions Precautions Precautions: Fall Precaution Comments: watch sats    Mobility  Bed Mobility Overal bed mobility: Modified Independent             General bed mobility comments: with need for use of rail and with scooting to [facility] increased time and difficulty with need for rails  Transfers       Sit to Stand: Supervision         General transfer comment: cues for safety and hand placement x 2 trials  Ambulation/Gait Ambulation/Gait assistance: Min guard Ambulation Distance (Feet): 125 Feet Assistive device: Rolling walker (2 wheeled) Gait Pattern/deviations: Step-through pattern;Decreased stride length;Trunk flexed   Gait velocity interpretation: Below normal speed for age/gender General Gait Details: cues for posture and position in RW with sats 92-96% on 2L with gait today. 2 trials performed with grossly 4 min seated rest between   Stairs            Wheelchair Mobility    Modified Rankin (Stroke Patients Only)       Balance                                     Cognition Arousal/Alertness: Awake/alert Behavior During Therapy: WFL for tasks assessed/performed Overall Cognitive Status: Within Functional Limits for tasks assessed                      Exercises General Exercises - Lower Extremity Heel Slides: AROM;Both;15 reps;Supine Hip ABduction/ADduction: AROM;Both;15 reps;Supine    General Comments        Pertinent Vitals/Pain Pain Assessment: No/denies pain    Home Living                      Prior Function            PT Goals (current goals can now be found in the care plan section) Progress towards PT goals: Progressing toward goals    Frequency       PT Plan Current plan remains appropriate    Co-evaluation             End of Session Equipment Utilized During Treatment: Oxygen Activity Tolerance: Patient limited by fatigue Patient left: in bed;with call bell/phone within reach;with family/visitor present (returned to bed for transfer to cath lab)     Time: 2263-3354 PT Time Calculation (min) (ACUTE ONLY): 25 min  Charges:  $Gait Training: 8-22 mins $Therapeutic Activity: 8-22 mins  G CodesMelford Aase 12-31-14, 1:27 PM Elwyn Reach, [place]

## 2014-12-15 ENCOUNTER — Inpatient Hospital Stay (HOSPITAL_COMMUNITY): Payer: Medicare Other

## 2014-12-15 DIAGNOSIS — G4733 Obstructive sleep apnea (adult) (pediatric): Secondary | ICD-10-CM

## 2014-12-15 LAB — CBC
HCT: 38.9 % (ref 36.0–46.0)
Hemoglobin: 12.4 g/dL (ref 12.0–15.0)
MCH: 29.5 pg (ref 26.0–34.0)
MCHC: 31.9 g/dL (ref 30.0–36.0)
MCV: 92.6 fL (ref 78.0–100.0)
PLATELETS: 195 10*3/uL (ref 150–400)
RBC: 4.2 MIL/uL (ref 3.87–5.11)
RDW: 13.9 % (ref 11.5–15.5)
WBC: 6.8 10*3/uL (ref 4.0–10.5)

## 2014-12-15 LAB — BASIC METABOLIC PANEL
ANION GAP: 11 (ref 5–15)
BUN: 17 mg/dL (ref 6–23)
CALCIUM: 8.8 mg/dL (ref 8.4–10.5)
CO2: 28 mmol/L (ref 19–32)
CREATININE: 0.64 mg/dL (ref 0.50–1.10)
Chloride: 97 mmol/L (ref 96–112)
GFR calc Af Amer: >90 mL/min (ref >90)
GFR, EST NON AFRICAN AMERICAN: 85 mL/min — AB (ref >90)
Glucose, Bld: 103 mg/dL — ABNORMAL HIGH (ref 70–99)
POTASSIUM: 4.6 mmol/L (ref 3.5–5.1)
SODIUM: 136 mmol/L (ref 135–145)

## 2014-12-15 LAB — GLUCOSE, CAPILLARY: Glucose-Capillary: 113 mg/dL — ABNORMAL HIGH (ref 70–99)

## 2014-12-15 MED ORDER — POTASSIUM CHLORIDE CRYS ER 20 MEQ PO TBCR: 20.0000 meq | EXTENDED_RELEASE_TABLET | Freq: Every day | ORAL | Status: DC

## 2014-12-15 MED ORDER — FUROSEMIDE 40 MG PO TABS: 40.0000 mg | ORAL_TABLET | Freq: Every day | ORAL | Status: DC

## 2014-12-15 MED ORDER — LISINOPRIL 5 MG PO TABS: 5.0000 mg | ORAL_TABLET | Freq: Every day | ORAL | Status: DC

## 2014-12-15 MED ORDER — FUROSEMIDE 40 MG PO TABS
40.0000 mg | ORAL_TABLET | Freq: Every day | ORAL | Status: DC
  Filled 2014-12-15: qty 1

## 2014-12-15 NOTE — Consult Note (Signed)
   [facility] CM Inpatient Consult   12/15/2014  [name] 06-09-1941 [id number] Patient evaluated for community based chronic disease management services with [facility] as a benefit of patient's Medicare Insurance. Spoke with patient and daughter, [name], at bedside to explain [place] [facility] services.  Patient will receive post discharge transition of care call and will be evaluated for monthly home visits for assessments and disease process education. Consent form signed.  Left contact information and [facility] literature at bedside. Made Inpatient Case Manager aware that [place] [facility] following. Of note, [facility] Care Management services does not replace or interfere with any services that are arranged by inpatient case management or social work.  For additional questions or referrals please contact: Natividad Brood, RN BSN [facility] Liaison  [phone] business mobile phone

## 2014-12-15 NOTE — Discharge Summary (Signed)
CARDIOLOGY DISCHARGE SUMMARY   [name] MRN: [id number] DOB/AGE: [birth year] 74 y.o.  Admit date: 12/06/2014 Discharge date: 12/15/2014  PCP: Simona Huh, MD Primary Cardiologist: Dr. Sallyanne Kuster  Primary Discharge Diagnosis:   Accelerated hypertension with diastolic congestive heart failure, NYHA class 3 - weight 206 pounds at discharge  Secondary Discharge Diagnosis:    CHB (complete heart block)    Pacemaker - dual chamber St Jude, 2009   Essential hypertension   Normal coronary arteries angiography 2002   Hyperlipidemia   Acute congestive heart failure   Congestive heart disease   Dyspnea   SOB (shortness of breath)   Sleep apnea   Hypoxemia   Acute respiratory failure with hypoxemia   Acute pulmonary edema   Pulmonary hypertension   Consults: [facility] care management, CCM, inpatient rehabilitation  Procedures: CT angiogram of the chest, Right Heart Cath, Left Heart Cath, Selective Coronary Angiography, LV angiography, pacemaker interrogation, PFTs  Hospital Course: [name] is a 74 y.o. female with a history of complete heart block and St. Jude pacemaker. She also has a history of hypertension, hyperlipidemia and diabetes but not CAD. Her husband passed away about 2 weeks prior to admission from pneumonia. She came to see her PCP complaining of orthopnea, PND and weight gain. She was transferred to the emergency room and admitted for further evaluation and treatment.  She was clearly volume overloaded and was admitted for diuresis and further evaluation. A 2-D echocardiogram was performed, results are below. She had mild LVH and an EF of 50% with no wall motion abnormalities. Her renal function and electrolytes were followed with diuresis. She required potassium supplementation. Her weight decreased by about 8 pounds during her hospital stay. As her weight decreased her shortness of breath improved, but she continued to have problems with hypoxia.  Case management was consulted and she is being started on home oxygen.  Her pacemaker was interrogated and showed almost continuous ventricular pacing with intermittent native atrial rate and atrial pacing at times. She has good battery life and will follow-up as an outpatient for this.  Her BNP was rechecked and showed a significant improvement with diuresis. She will be discharged on oral Lasix.  She had problems with lower extremity weakness and was a fall risk. She was seen by physical therapy and consideration was given to inpatient cardiac rehabilitation. A PM&R consult was called. She was seen by Dr. Tessa Lerner who recommended home health versus skilled nursing facility placement. The patient refused skilled nursing facility placement as her husband had recently died in a nursing home. Family, including her daughter, were involved in the patient's care and stated they can provide 24/7 assistance at discharge. She will have home health RN, PT and aide.   A CCM consult was called because she had continued oxygen dependence despite normal volume status. Dr. Nelda Marseille recommended a CT of the chest, which was performed, results below. It showed no PE or significant structural abnormality. She continued to have problems with oxygen desaturation, especially with activity and sleep. She will be on home oxygen at discharge. There was also consideration of BiPAP daily at bedtime upon discharge. She will need to follow-up with pulmonary M.D. when she is more recovered from her acute illness to determine if this is necessary. Once again skilled nursing facility placement was recommended but the patient continued to refuse. PFTs were performed.  The PFTs demonstrated a moderately severe airway obstruction and a concurrent restrictive process, no response  to bronchodilators. She will not benefit from bronchodilators, and further studies with exercise can be considered as an outpatient to evaluate for hypoxemia.  She  continued to have problems with dyspnea on exertion and hypoxemia despite diuresis, a right and left heart cath was performed on 12/14/2014. Results are below, she had no significant CAD, her PA pressures were 38/16 and her mean wedge was 10. Medical management was recommended.  On 12/15/2014, she was seen by Dr. Acie Fredrickson and all data were reviewed. She was seen by case management and arrangements were made for outpatient assistance. No medical equipment was needed. She is to continue her current dose of beta blocker, Lasix and lisinopril. She will need early follow-up in the outpatient setting with a BMET to follow her renal function and electrolytes. No further inpatient workup is indicated and she is considered stable for discharge, to follow-up as an outpatient.   Labs:  Lab Results  Component Value Date   WBC 6.8 12/15/2014   HGB 12.4 12/15/2014   HCT 38.9 12/15/2014   MCV 92.6 12/15/2014   PLT 195 12/15/2014     Recent Labs Lab 12/15/14 0505  NA 136  K 4.6  CL 97  CO2 28  BUN 17  CREATININE 0.64  CALCIUM 8.8  GLUCOSE 103*   Lab Results  Component Value Date   TROPONINI 0.03 12/06/2014   B NATRIURETIC PEPTIDE  Date/Time Value Ref Range Status  12/09/2014 04:08 AM 36.1 0.0 - 100.0 pg/mL Final  12/06/2014 12:00 PM 599.9* 0.0 - 100.0 pg/mL Final    Recent Labs  12/14/14 0535  INR 1.00      Radiology: Dg Chest 2 View 12/15/2014   CLINICAL DATA:  Cough and shortness of breath  EXAM: CHEST  2 VIEW  COMPARISON:  Chest radiograph December 09, 2014 and chest CT December 11, 2014  FINDINGS: There is no edema or consolidation. Heart size is upper normal with pulmonary vascularity are within normal limits. No adenopathy. Pacemaker lead tips are attached to the right atrium and right ventricle. No pneumothorax. Postoperative changes noted in the lower cervical spine.  IMPRESSION: No edema or consolidation.   Electronically Signed   By: Lowella Grip III M.D.   On: 12/15/2014 09:18    Dg Chest 2 View 12/09/2014   CLINICAL DATA:  Dyspnea  EXAM: CHEST  2 VIEW  COMPARISON:  12/06/2014  FINDINGS: Cardiomegaly again noted. Central mild vascular congestion without pulmonary edema. There is improvement in aeration. No segmental infiltrate. Dual lead cardiac pacemaker is unchanged in position.  IMPRESSION: Improvement in aeration. Cardiomegaly again noted. Central mild vascular contrast without pulmonary edema. No segmental infiltrate. Stable dual lead cardiac pacemaker position.   Electronically Signed   By: Lahoma Crocker M.D.   On: 12/09/2014 13:05   Ct Angio Chest Pe W/cm &/or Wo Cm 12/11/2014   CLINICAL DATA:  Dyspnea since 12/06/2014.  EXAM: CT ANGIOGRAPHY CHEST WITH CONTRAST  TECHNIQUE: Multidetector CT imaging of the chest was performed using the standard protocol during bolus administration of intravenous contrast. Multiplanar CT image reconstructions and MIPs were obtained to evaluate the vascular anatomy.  CONTRAST:  80 mL OMNIPAQUE IOHEXOL 350 MG/ML SOLN  COMPARISON:  Plain films of the chest 12/09/2014.  FINDINGS: No pulmonary embolus is identified. The right and left main pulmonary arteries are dilated, approximately the same size as the ascending aorta compatible with pulmonary arterial hypertension. There is mild cardiomegaly. No pleural or pericardial effusion. Calcific aortic and coronary atherosclerosis is identified. A  pacing device in place. There is no axillary, hilar or mediastinal lymphadenopathy. The lungs show linear atelectatic change in the right base.  Visualized upper abdomen demonstrates no focal abnormality. No lytic or sclerotic bony lesion is seen.  Review of the MIP images confirms the above findings.  IMPRESSION: Negative for pulmonary embolus.  Mild cardiomegaly.  Calcific aortic and coronary atherosclerosis.  Findings compatible with pulmonary arterial hypertension.   Electronically Signed   By: Inge Rise M.D.   On: 12/11/2014 16:07   Dg Chest Port 1  View 12/06/2014   CLINICAL DATA:  Shortness of Breath  EXAM: PORTABLE CHEST - 1 VIEW  COMPARISON:  February 27, 2012  FINDINGS: There is underlying emphysematous change. There is cardiomegaly with pulmonary venous hypertension and interstitial edema. There is no airspace consolidation. Pacemaker leads are attached to the right atrium and right ventricle. No adenopathy. There is postoperative change in the lower cervical spine.  IMPRESSION: Congestive heart failure superimposed on emphysematous change. No airspace consolidation.   Electronically Signed   By: Lowella Grip III M.D.   On: 12/06/2014 12:59   Cardiac Cath: 12/14/2014 Procedural Findings: Hemodynamics RA 8/5 mean 4 mm Hg RV 38/5/7 mm Hg PA 38/16 mean 24 mm Hg  PCWP 12/10 mean 10 mm Hg LV 144/14 mm Hg AO 138/59 mean 86 mm Hg Oxygen saturations: PA 68% AO 92% Cardiac Output (Fick) 6.25 L/min  Cardiac Index (Fick) 3.1 L/min/meter squared.  Coronary angiography: Coronary dominance: right Left mainstem: Normal Left anterior descending (LAD): 30% stenosis at the takeoff of the first diagonal. Otherwise normal. Left circumflex (LCx): Normal Right coronary artery (RCA): Normal.  Left ventriculography: Left ventricular systolic function is normal, LVEF is estimated at 55-65%, there is no significant mitral regurgitation  Final Conclusions:  1. No significant CAD 2. Normal LV function.  3. Normal right heart pressures and low to normal LV filling pressures.  Recommendations: Medical management. Will DC IV lasix. Consider adding lower dose oral lasix in am.   EKG: Sinus rhythm, V pacing  Echo: 12/08/2014 Conclusions - Left ventricle: The cavity size was normal. Wall thickness was increased in a pattern of mild LVH. Systolic function was normal. The estimated ejection fraction was in the range of 50% to 55%. Wall motion was normal; there were no regional wall motion abnormalities. The study is not technically  sufficient to allow evaluation of LV diastolic function. - Ventricular septum: Septal motion showed abnormal function and dyssynergy. - Aortic valve: There was trivial regurgitation. - Mitral valve: Calcified annulus. There was mild regurgitation. - Left atrium: The atrium was mildly dilated. Impressions: - Normal to mild global reduction in LV function (EF 50); mild LAE; mild MR; trace TR.  FOLLOW UP PLANS AND APPOINTMENTS No Known Allergies   Medication List    TAKE these medications        aspirin EC 81 MG tablet  Take 81 mg by mouth daily.     atenolol 25 MG tablet  Commonly known as:  TENORMIN  Take 25 mg by mouth daily.     furosemide 40 MG tablet  Commonly known as:  LASIX  Take 1 tablet (40 mg total) by mouth daily.     gabapentin 300 MG capsule  Commonly known as:  NEURONTIN  Take 300 mg by mouth 3 (three) times daily.     lisinopril 5 MG tablet  Commonly known as:  PRINIVIL,ZESTRIL  Take 1 tablet (5 mg total) by mouth daily.     metFORMIN  500 MG 24 hr tablet  Commonly known as:  GLUCOPHAGE-XR  Take 500 mg by mouth daily.  Notes to Patient:  Hold for 48 hours, restart on 12/18/2014.     MULTIVITAMIN PO  Take by mouth daily.     potassium chloride SA 20 MEQ tablet  Commonly known as:  K-DUR,KLOR-CON  Take 1 tablet (20 mEq total) by mouth daily.     pravastatin 40 MG tablet  Commonly known as:  PRAVACHOL  Take 40 mg by mouth daily.        Discharge Instructions    (HEART FAILURE PATIENTS) Call MD:  Anytime you have any of the following symptoms: 1) 3 pound weight gain in 24 hours or 5 pounds in 1 week 2) shortness of breath, with or without a dry hacking cough 3) swelling in the hands, feet or stomach 4) if you have to sleep on extra pillows at night in order to breathe.    Complete by:  As directed      AMB Referral to [facility]    Complete by:  As directed   Reason for consult:  HF; long hospital stay; transition to home and  support; diet and medication education  Diagnoses of:  Heart Failure  Expected date of contact:  1-3 days (reserved for hospital discharges)     Diet - low sodium heart healthy    Complete by:  As directed      Diet Carb Modified    Complete by:  As directed      Increase activity slowly    Complete by:  As directed           Follow-up Information    Follow up with Sanda Klein, MD.   Specialty:  Cardiology   Why:  The office will call.   Contact information:   [place] [address] [phone]       BRING ALL MEDICATIONS WITH YOU TO FOLLOW UP APPOINTMENTS  Time spent with patient to include physician time: 55 min Signed: Rosaria Ferries, PA-C 12/15/2014, 12:27 PM Co-Sign MD  Attending Note:   The patient was seen and examined.  Agree with assessment and plan as noted above.  Changes made to the above note as needed.  See my note from earlier today   Ramond Dial., MD, [facility] 12/15/2014, 5:06 PM [address],  [place] Pager [phone]

## 2014-12-15 NOTE — Progress Notes (Signed)
Patient Name: [name] Date of Encounter: 12/15/2014     Principal Problem:   Accelerated hypertension with diastolic congestive heart failure, NYHA class 3 Active Problems:   CHB (complete heart block)   Pacemaker - dual chamber St Jude, 2009   Essential hypertension   Normal coronary arteries angiography 2002   Hyperlipidemia   Acute congestive heart failure   Congestive heart disease   Dyspnea   SOB (shortness of breath)   Sleep apnea   Hypoxemia   Acute respiratory failure with hypoxemia   Acute pulmonary edema   Pulmonary hypertension    SUBJECTIVE  She is feeling the same. No real improvement in breathing although she does "feel more awake this AM." PFT show non reactive lung disease.  DLCO is suggestive of pulmonary edema   R and L heart cath looked normal. No CAD Normal Right sided filling pressures.   CURRENT MEDS . albuterol  2.5 mg Nebulization Once  . aspirin EC  81 mg Oral Daily  . atenolol  25 mg Oral Daily  . gabapentin  300 mg Oral TID  . heparin  5,000 Units Subcutaneous 3 times per day  . insulin aspart  0-15 Units Subcutaneous TID WC  . insulin aspart  0-5 Units Subcutaneous QHS  . lisinopril  5 mg Oral Daily  . metFORMIN  500 mg Oral Q breakfast  . potassium chloride  20 mEq Oral Daily  . pravastatin  40 mg Oral Daily  . sodium chloride  3 mL Intravenous Q12H    OBJECTIVE  Filed Vitals:   12/14/14 1645 12/14/14 1745 12/14/14 2056 12/15/14 0517  BP: 119/48 129/51 139/58 145/46  Pulse: 68 69 70 75  Temp:   97.7 F (36.5 C) 98 F (36.7 C)  TempSrc:   Oral Oral  Resp:   18 18  Height:      Weight:    206 lb 5.6 oz (93.6 kg)  SpO2: 94%  98% 99%    Intake/Output Summary (Last 24 hours) at 12/15/14 1008 Last data filed at 12/15/14 0729  Gross per 24 hour  Intake    998 ml  Output    200 ml  Net    798 ml   Filed Weights   12/12/14 0639 12/13/14 0549 12/15/14 0517  Weight: 205 lb 9.6 oz (93.26 kg) 206 lb 5.6 oz (93.6 kg)  206 lb 5.6 oz (93.6 kg)    PHYSICAL EXAM  General: Pleasant, NAD. Obese. Appears comfortable on 02. Neuro: Alert and oriented X 3. Moves all extremities spontaneously. Psych: Normal affect. HEENT:  Normal  Neck: Supple without bruits or JVD. Lungs:  Resp regular and unlabored, CTA. Heart: RRR no s3, s4, or murmurs. Abdomen: Soft, non-tender, non-distended, BS + x 4.  Extremities: No clubbing, cyanosis or edema. DP/PT/Radials 2+ and equal bilaterally.  Accessory Clinical Findings  CBC  Recent Labs  12/14/14 0535 12/15/14 0505  WBC 7.2 6.8  HGB 13.1 12.4  HCT 41.6 38.9  MCV 92.9 92.6  PLT 207 659   Basic Metabolic Panel  Recent Labs  12/14/14 0535 12/15/14 0505  NA 138 136  K 4.9 4.6  CL 94* 97  CO2 36* 28  GLUCOSE 123* 103*  BUN 28* 17  CREATININE 0.80 0.64  CALCIUM 9.0 8.8     TELE Rhythm is atrial sensed ventricular paced.  Radiology/Studies  Dg Chest 2 View  12/15/2014   CLINICAL DATA:  Cough and shortness of breath  EXAM: CHEST  2  VIEW  COMPARISON:  Chest radiograph December 09, 2014 and chest CT December 11, 2014  FINDINGS: There is no edema or consolidation. Heart size is upper normal with pulmonary vascularity are within normal limits. No adenopathy. Pacemaker lead tips are attached to the right atrium and right ventricle. No pneumothorax. Postoperative changes noted in the lower cervical spine.  IMPRESSION: No edema or consolidation.   Electronically Signed   By: Lowella Grip III M.D.   On: 12/15/2014 09:18   Dg Chest 2 View  12/09/2014   CLINICAL DATA:  Dyspnea  EXAM: CHEST  2 VIEW  COMPARISON:  12/06/2014  FINDINGS: Cardiomegaly again noted. Central mild vascular congestion without pulmonary edema. There is improvement in aeration. No segmental infiltrate. Dual lead cardiac pacemaker is unchanged in position.  IMPRESSION: Improvement in aeration. Cardiomegaly again noted. Central mild vascular contrast without pulmonary edema. No segmental infiltrate.  Stable dual lead cardiac pacemaker position.   Electronically Signed   By: Lahoma Crocker M.D.   On: 12/09/2014 13:05   Ct Angio Chest Pe W/cm &/or Wo Cm  12/11/2014   CLINICAL DATA:  Dyspnea since 12/06/2014.  EXAM: CT ANGIOGRAPHY CHEST WITH CONTRAST  TECHNIQUE: Multidetector CT imaging of the chest was performed using the standard protocol during bolus administration of intravenous contrast. Multiplanar CT image reconstructions and MIPs were obtained to evaluate the vascular anatomy.  CONTRAST:  80 mL OMNIPAQUE IOHEXOL 350 MG/ML SOLN  COMPARISON:  Plain films of the chest 12/09/2014.  FINDINGS: No pulmonary embolus is identified. The right and left main pulmonary arteries are dilated, approximately the same size as the ascending aorta compatible with pulmonary arterial hypertension. There is mild cardiomegaly. No pleural or pericardial effusion. Calcific aortic and coronary atherosclerosis is identified. A pacing device in place. There is no axillary, hilar or mediastinal lymphadenopathy. The lungs show linear atelectatic change in the right base.  Visualized upper abdomen demonstrates no focal abnormality. No lytic or sclerotic bony lesion is seen.  Review of the MIP images confirms the above findings.  IMPRESSION: Negative for pulmonary embolus.  Mild cardiomegaly.  Calcific aortic and coronary atherosclerosis.  Findings compatible with pulmonary arterial hypertension.   Electronically Signed   By: Inge Rise M.D.   On: 12/11/2014 16:07   Dg Chest Port 1 View  12/06/2014   CLINICAL DATA:  Shortness of Breath  EXAM: PORTABLE CHEST - 1 VIEW  COMPARISON:  February 27, 2012  FINDINGS: There is underlying emphysematous change. There is cardiomegaly with pulmonary venous hypertension and interstitial edema. There is no airspace consolidation. Pacemaker leads are attached to the right atrium and right ventricle. No adenopathy. There is postoperative change in the lower cervical spine.  IMPRESSION: Congestive heart  failure superimposed on emphysematous change. No airspace consolidation.   Electronically Signed   By: Lowella Grip III M.D.   On: 12/06/2014 12:59   2D echo 12/07/14 Study Conclusions - Left ventricle: The cavity size was normal. Wall thickness was increased in a pattern of mild LVH. Systolic function was normal. The estimated ejection fraction was in the range of 50% to 55%. Wall motion was normal; there were no regional wall motion abnormalities. The study is not technically sufficient to allow evaluation of LV diastolic function. - Ventricular septum: Septal motion showed abnormal function and dyssynergy. - Aortic valve: There was trivial regurgitation. - Mitral valve: Calcified annulus. There was mild regurgitation. - Left atrium: The atrium was mildly dilated. Impressions: - Normal to mild global reduction in LV function (EF  75); mild LAE; mild MR; trace TR.   ASSESSMENT AND PLAN  74 yo female w/ hx HTN, HL, DM, St Jude PPM for CHB, nl cors '02, nl EF, admitted 04/06 w/ SOB and CHF.   Acute Diastolic CHF: EF normal on echo at 50-55%. BNP 599 on admission. CXR with pulm edema.    HTN: well controlled. Continue atenolol 25mg , lasix 40mg  qd and lisinopril 5mg   DM- continue home regimen  HLD- cont statin.  Deconditioning- PT recommending SNF.  The family has refused SNF.  Inpatient rehab has been consulted and she is not a candidate for inpatient rehab. Note to physical therapy - please assess patient to determine whether or not it is saft for her to go home with home PT.  Possible DC today    Ramond Dial., MD, [facility] 12/15/2014, 10:08 AM [address]  [place] Pager [phone]

## 2014-12-15 NOTE — Progress Notes (Signed)
SATURATION QUALIFICATIONS: (This note is used to comply with regulatory documentation for home oxygen)  Patient Saturations on Room Air at Rest =  93%  Patient Saturations on Room Air while Ambulating = 79*%  Patient Saturations on 2 Liters of oxygen while Ambulating= 94*%  Please briefly explain why patient needs home oxygen:

## 2014-12-15 NOTE — Progress Notes (Signed)
Pt oxygen saturation at 93% at rest on room air 15 minutes off oxygen. Desats to 79% lowest recorded during 200 feet  Ambulation.; and 94% on 2 liters nasal canula  While ambulating.

## 2014-12-15 NOTE — Discharge Instructions (Signed)
PLEASE REMEMBER TO BRING ALL OF YOUR MEDICATIONS TO EACH OF YOUR FOLLOW-UP OFFICE VISITS. ° °PLEASE ATTEND ALL SCHEDULED FOLLOW-UP APPOINTMENTS.  ° °Activity: Increase activity slowly as tolerated. You may shower, but no soaking baths (or swimming) for 1 week. No driving for 2 days. No lifting over 5 lbs for 1 week. No sexual activity for 1 week.  ° °You May Return to Work: in 1 week (if applicable) ° °Wound Care: You may wash cath site gently with soap and water. Keep cath site clean and dry. If you notice pain, swelling, bleeding or pus at your cath site, please call [phone]. ° ° ° °Cardiac Cath Site Care °Refer to this sheet in the next few weeks. These instructions provide you with information on caring for yourself after your procedure. Your caregiver may also give you more specific instructions. Your treatment has been planned according to current medical practices, but problems sometimes occur. Call your caregiver if you have any problems or questions after your procedure. °HOME CARE INSTRUCTIONS °· You may shower 24 hours after the procedure. Remove the bandage (dressing) and gently wash the site with plain soap and water. Gently pat the site dry.  °· Do not apply powder or lotion to the site.  °· Do not sit in a bathtub, swimming pool, or whirlpool for 5 to 7 days.  °· No bending, squatting, or lifting anything over 10 pounds (4.5 kg) as directed by your caregiver.  °· Inspect the site at least twice daily.  °· Do not drive home if you are discharged the same day of the procedure. Have someone else drive you.  °· You may drive 24 hours after the procedure unless otherwise instructed by your caregiver.  °What to expect: °· Any bruising will usually fade within 1 to 2 weeks.  °· Blood that collects in the tissue (hematoma) may be painful to the touch. It should usually decrease in size and tenderness within 1 to 2 weeks.  °SEEK IMMEDIATE MEDICAL CARE IF: °· You have unusual pain at the site or down the  affected limb.  °· You have redness, warmth, swelling, or pain at the site.  °· You have drainage (other than a small amount of blood on the dressing).  °· You have chills.  °· You have a fever or persistent symptoms for more than 72 hours.  °· You have a fever and your symptoms suddenly get worse.  °· Your leg becomes pale, cool, tingly, or numb.  °· You have heavy bleeding from the site. Hold pressure on the site.  °Document Released: 09/20/2010 Document Revised: 08/07/2011 Document Reviewed: 09/20/2010 °[employer]® Patient Information ©2012 [employer]. ° °

## 2014-12-15 NOTE — Progress Notes (Signed)
Name: [name] MRN: [id number] DOB: 1941/07/12    ADMISSION DATE:  12/06/2014 CONSULTATION DATE:  4/11  REFERRING MD :  [name]   CHIEF COMPLAINT:  Hypoxia   BRIEF PATIENT DESCRIPTION: 74 year old female w/ PMH HTN, diastolic dysfn, pacemaker dependent since 09 d/t CHB, w/ 20 pack year hx (stopped smoking 20 yrs ago). Admitted on 4/8 w/ working dx of decompensated diastolic HF and accelerated HTN. Treated w/ aggressive diuresis and BP control. PCCM asked to see on 4/11 for on-going exertional O2 dependence in spite of above rx.   SIGNIFICANT EVENTS  4/06  Admit with decompensated dCHF 4/14  R/L HC   STUDIES:  4/08  ECHO >> EF 50-55%; Wall thickness wasincreased in a pattern of mild LVH. Ventricular septum: Septal motion showed abnormal function anddyssynergy.  Aortic valve: There was trivial regurgitation.  Mitral valve: Calcified annulus. There was mild regurgitation.  Left atrium: The atrium was mildly dilated. 4/11  CT Chest  >> no pe +PAH 4/13  PFT >> consistent with obstructive, non reactive (no BD response) lung disease and DLCO suggestive of pulmonary edema.   4/14  R/L HC >>  SUBJECTIVE:   Remains on 2L O2, reports she is thirsty/hungry.  No acute events.  No dyspnea at rest   VITAL SIGNS: Temp:  [97.7 F (36.5 C)-98 F (36.7 C)] 98 F (36.7 C) (04/15 0517) Pulse Rate:  [68-80] 80 (04/15 1022) Resp:  [18-19] 18 (04/15 0517) BP: (119-158)/(46-92) 151/58 mmHg (04/15 1022) SpO2:  [81 %-99 %] 99 % (04/15 0517) Weight:  [93.6 kg (206 lb 5.6 oz)] 93.6 kg (206 lb 5.6 oz) (04/15 0517)  PHYSICAL EXAMINATION: General:  Chronically ill appearing female. NAD but easily dyspneic with talking, moving.  Neuro:  Awake, alert, no focal def  HEENT:  [place], no JVD  Cardiovascular:  rrr Lungs:  Resps even/non labored at rest, few basilar rales, no wheeze Abdomen:  NT, + bowel sounds Musculoskeletal:  intact Skin:  Only trace LE edema    Recent Labs Lab 12/13/14 0340  12/14/14 0535 12/15/14 0505  NA 136 138 136  K 5.0 4.9 4.6  CL 97 94* 97  CO2 30 36* 28  BUN 24* 28* 17  CREATININE 0.59 0.80 0.64  GLUCOSE 109* 123* 103*    Recent Labs Lab 12/13/14 0340 12/14/14 0535 12/15/14 0505  HGB 13.2 13.1 12.4  HCT 42.7 41.6 38.9  WBC 8.5 7.2 6.8  PLT 211 207 195   Dg Chest 2 View  12/15/2014   CLINICAL DATA:  Cough and shortness of breath  EXAM: CHEST  2 VIEW  COMPARISON:  Chest radiograph December 09, 2014 and chest CT December 11, 2014  FINDINGS: There is no edema or consolidation. Heart size is upper normal with pulmonary vascularity are within normal limits. No adenopathy. Pacemaker lead tips are attached to the right atrium and right ventricle. No pneumothorax. Postoperative changes noted in the lower cervical spine.  IMPRESSION: No edema or consolidation.   Electronically Signed   By: Lowella Grip III M.D.   On: 12/15/2014 09:18    ASSESSMENT / PLAN:  Exertional dyspnea/hypoxia Acute Decompensated Diastolic HF Pulmonary HTN by CT Deconditioning Persistent  Hypoxemia  Nocturnal desaturation  ?obstructive lung disease   Discussion Suspect that her symptom burden can most-likely be explained still by decompensated HF. She does have a 20pk/yr h/o smoking but it seems unusual for her to suddenly developed obstructive lung disease > 20 yrs after smoking cessation. Wonder  about secondary PAH, although estimated CVP of 3 on echo would argue against this.  PFT's consistent with obstructive, non reactive (no BD response) lung disease and DLCO suggestive of pulmonary edema.    Recommendations  For R and L heart cath per cards 4/14 Continue diuresis as tol  Nocturnal desat study ordered and pending No role steroids at this time  Ambulatory desat after cath is complete Will likely need home o2 Will hold off on BDs for now  PFTs completed.  I reviewed them myself and evidence of irreversible obstruction that is not moderate and would not explain the  degree of SOB.  DLCO is very low which can be explained by pulmonary edema associated with decompensated heart failure.  Cardiac cath results reviewed, PAP of 38/16 so mild pulmonary HTN and wedge of 12.  Cardiac index of 3.1.  Decompensated heart failure: continue diureses as BP and renal function allows.  Lasix 40 mg daily, atenolol 25 mg and lisinopril 5 mg daily  Pul HTN: Mild with PAP of 38.  Hypoxemic respiratory failure: due to pulmonary edema.  Diureses as ordered, 40 mg daily.  Will order ambulatory desaturation study to obtain home O2 today, anticipate will need O2 with activity.  Obstructive lung disease: irreversible will d/c all bronchodilators.  PCCM will sign off, please call back if needed.  Rush Farmer, M.D. [facility] Pulmonary/Critical Care Medicine. Pager: [phone]. After hours pager: [phone].  12/15/2014  11:08 AM

## 2014-12-18 ENCOUNTER — Other Ambulatory Visit: Payer: Self-pay

## 2014-12-18 ENCOUNTER — Telehealth: Payer: Self-pay | Admitting: Cardiovascular Disease

## 2014-12-18 DIAGNOSIS — Z950 Presence of cardiac pacemaker: Secondary | ICD-10-CM | POA: Diagnosis not present

## 2014-12-18 DIAGNOSIS — I5031 Acute diastolic (congestive) heart failure: Secondary | ICD-10-CM | POA: Diagnosis not present

## 2014-12-18 DIAGNOSIS — I442 Atrioventricular block, complete: Secondary | ICD-10-CM | POA: Diagnosis not present

## 2014-12-18 DIAGNOSIS — I272 Other secondary pulmonary hypertension: Secondary | ICD-10-CM | POA: Diagnosis not present

## 2014-12-18 DIAGNOSIS — G4733 Obstructive sleep apnea (adult) (pediatric): Secondary | ICD-10-CM | POA: Diagnosis not present

## 2014-12-18 DIAGNOSIS — E1165 Type 2 diabetes mellitus with hyperglycemia: Secondary | ICD-10-CM | POA: Diagnosis not present

## 2014-12-18 DIAGNOSIS — E785 Hyperlipidemia, unspecified: Secondary | ICD-10-CM | POA: Diagnosis not present

## 2014-12-18 DIAGNOSIS — Z9981 Dependence on supplemental oxygen: Secondary | ICD-10-CM | POA: Diagnosis not present

## 2014-12-18 NOTE — Patient Outreach (Signed)
Transition of care call: Phone call to patient at 3pm. Stated weight of 202 pounds today.   Patient reports that she is doing well. States that she is eating well. Reports following a low salt diet.   Reports home health nurse was at her home today to see her. Denies any problems with medications at this time.  I arranged follow up with primary MD (12/21/2014 at 10:30 am ) and cardiology (12/29/2014 at 10:15am.  This was communicated to patient.    Patient was unsure of how much oxygen she was supposed to be on. Placed call to cardiology and confirmed that patient is on 2 Liters Nasal [place].  Patient informed.  Plan:  Patient will be outreached weekly for transition of care.  Provided my contact information for patient to call me if needed.  Reviewed all meds.  [name]        Patient Outreach Telephone from 12/18/2014 in [place] Problem One  Patient with recent admission related to heart failure.   Care Plan for Problem One  Active   Interventions for Problem One Long Term Goal  Explained importance of timely follow up with MD.  Explained importance of daily heart failure management like weights and low salt diet.   THN Long Term Goal (31-90 days)  Patient will be able to report no readmission in the next 31 days.   THN Long Term Goal Start Date  12/18/14   THN CM Short Term Goal #1 (0-30 days)  Patient will be able to verbalize daily weights in the next 30 days.   THN CM Short Term Goal #1 Start Date  12/18/14   THN CM Short Term Goal #2 (0-30 days)  Patient will be able to report following low salt diet for 30 days.   [facility] CM Short Term Goal #2 Start Date  12/18/14      Tammy Rand, RN, BSN, CEN [place] Coordinator [phone]

## 2014-12-18 NOTE — Telephone Encounter (Signed)
Patient contacted regarding discharge from [facility] on 12/15/2014.  Patient understands to follow up with provider K. Grandville Silos, [place] on 12/29/2014 at 10:15am at [facility]. Patient understands discharge instructions? yes Patient understands medications and regiment? yes Patient understands to bring all medications to this visit? yes  Reiterated patient appointment date, time, and location  Spoke with Tomasa Rand, RN with [facility] - patient should be on 2L O2 per pulm/critical care note and saturation qualifications note documented in [place]. She will communicate this to patient

## 2014-12-18 NOTE — Telephone Encounter (Signed)
[name] is calling because the patient went home with oxygen and not sure what the oxygen orders should be . Please call

## 2014-12-18 NOTE — Telephone Encounter (Signed)
TCM Patient .Marland Kitchen Appt Date 04/29@10 :15am .phone#[phone]

## 2014-12-19 DIAGNOSIS — E1165 Type 2 diabetes mellitus with hyperglycemia: Secondary | ICD-10-CM | POA: Diagnosis not present

## 2014-12-19 DIAGNOSIS — I272 Other secondary pulmonary hypertension: Secondary | ICD-10-CM | POA: Diagnosis not present

## 2014-12-19 DIAGNOSIS — E785 Hyperlipidemia, unspecified: Secondary | ICD-10-CM | POA: Diagnosis not present

## 2014-12-19 DIAGNOSIS — I442 Atrioventricular block, complete: Secondary | ICD-10-CM | POA: Diagnosis not present

## 2014-12-19 DIAGNOSIS — G4733 Obstructive sleep apnea (adult) (pediatric): Secondary | ICD-10-CM | POA: Diagnosis not present

## 2014-12-19 DIAGNOSIS — I5031 Acute diastolic (congestive) heart failure: Secondary | ICD-10-CM | POA: Diagnosis not present

## 2014-12-20 DIAGNOSIS — I5031 Acute diastolic (congestive) heart failure: Secondary | ICD-10-CM | POA: Diagnosis not present

## 2014-12-20 DIAGNOSIS — E1165 Type 2 diabetes mellitus with hyperglycemia: Secondary | ICD-10-CM | POA: Diagnosis not present

## 2014-12-20 DIAGNOSIS — E785 Hyperlipidemia, unspecified: Secondary | ICD-10-CM | POA: Diagnosis not present

## 2014-12-20 DIAGNOSIS — G4733 Obstructive sleep apnea (adult) (pediatric): Secondary | ICD-10-CM | POA: Diagnosis not present

## 2014-12-20 DIAGNOSIS — I272 Other secondary pulmonary hypertension: Secondary | ICD-10-CM | POA: Diagnosis not present

## 2014-12-20 DIAGNOSIS — I442 Atrioventricular block, complete: Secondary | ICD-10-CM | POA: Diagnosis not present

## 2014-12-21 ENCOUNTER — Encounter: Payer: Self-pay | Admitting: Cardiovascular Disease

## 2014-12-21 DIAGNOSIS — J449 Chronic obstructive pulmonary disease, unspecified: Secondary | ICD-10-CM | POA: Diagnosis not present

## 2014-12-21 DIAGNOSIS — I442 Atrioventricular block, complete: Secondary | ICD-10-CM | POA: Diagnosis not present

## 2014-12-21 DIAGNOSIS — M4806 Spinal stenosis, lumbar region: Secondary | ICD-10-CM | POA: Diagnosis not present

## 2014-12-21 DIAGNOSIS — G4733 Obstructive sleep apnea (adult) (pediatric): Secondary | ICD-10-CM | POA: Diagnosis not present

## 2014-12-21 DIAGNOSIS — E785 Hyperlipidemia, unspecified: Secondary | ICD-10-CM | POA: Diagnosis not present

## 2014-12-21 DIAGNOSIS — E1165 Type 2 diabetes mellitus with hyperglycemia: Secondary | ICD-10-CM | POA: Diagnosis not present

## 2014-12-21 DIAGNOSIS — I110 Hypertensive heart disease with heart failure: Secondary | ICD-10-CM | POA: Diagnosis not present

## 2014-12-21 DIAGNOSIS — I5031 Acute diastolic (congestive) heart failure: Secondary | ICD-10-CM | POA: Diagnosis not present

## 2014-12-21 DIAGNOSIS — I700 Atherosclerosis of aorta: Secondary | ICD-10-CM | POA: Diagnosis not present

## 2014-12-21 DIAGNOSIS — Z950 Presence of cardiac pacemaker: Secondary | ICD-10-CM | POA: Diagnosis not present

## 2014-12-21 DIAGNOSIS — R197 Diarrhea, unspecified: Secondary | ICD-10-CM | POA: Diagnosis not present

## 2014-12-21 DIAGNOSIS — I272 Other secondary pulmonary hypertension: Secondary | ICD-10-CM | POA: Diagnosis not present

## 2014-12-22 ENCOUNTER — Telehealth: Payer: Self-pay | Admitting: Cardiovascular Disease

## 2014-12-22 DIAGNOSIS — I5031 Acute diastolic (congestive) heart failure: Secondary | ICD-10-CM | POA: Diagnosis not present

## 2014-12-22 DIAGNOSIS — E1165 Type 2 diabetes mellitus with hyperglycemia: Secondary | ICD-10-CM | POA: Diagnosis not present

## 2014-12-22 DIAGNOSIS — E785 Hyperlipidemia, unspecified: Secondary | ICD-10-CM | POA: Diagnosis not present

## 2014-12-22 DIAGNOSIS — I442 Atrioventricular block, complete: Secondary | ICD-10-CM | POA: Diagnosis not present

## 2014-12-22 DIAGNOSIS — I272 Other secondary pulmonary hypertension: Secondary | ICD-10-CM | POA: Diagnosis not present

## 2014-12-22 DIAGNOSIS — R197 Diarrhea, unspecified: Secondary | ICD-10-CM | POA: Diagnosis not present

## 2014-12-22 DIAGNOSIS — G4733 Obstructive sleep apnea (adult) (pediatric): Secondary | ICD-10-CM | POA: Diagnosis not present

## 2014-12-22 NOTE — Telephone Encounter (Signed)
Received records from [place] for appointment on 01/16/15 with Dr Sallyanne Kuster.  Records given to [facility] (medical records) for Dr [name];s schedule on 01/16/15.  lp

## 2014-12-25 ENCOUNTER — Other Ambulatory Visit: Payer: Self-pay

## 2014-12-25 DIAGNOSIS — I442 Atrioventricular block, complete: Secondary | ICD-10-CM | POA: Diagnosis not present

## 2014-12-25 DIAGNOSIS — I5031 Acute diastolic (congestive) heart failure: Secondary | ICD-10-CM | POA: Diagnosis not present

## 2014-12-25 DIAGNOSIS — I272 Other secondary pulmonary hypertension: Secondary | ICD-10-CM | POA: Diagnosis not present

## 2014-12-25 DIAGNOSIS — E785 Hyperlipidemia, unspecified: Secondary | ICD-10-CM | POA: Diagnosis not present

## 2014-12-25 DIAGNOSIS — G4733 Obstructive sleep apnea (adult) (pediatric): Secondary | ICD-10-CM | POA: Diagnosis not present

## 2014-12-25 DIAGNOSIS — E1165 Type 2 diabetes mellitus with hyperglycemia: Secondary | ICD-10-CM | POA: Diagnosis not present

## 2014-12-25 NOTE — Patient Outreach (Signed)
Patient reports that she is doing well.States that her weight is 200-202 pounds. Continues to take meds as prescribed.  Reports that home health nurse is coming to see her tomorrow. Reports physical therapy is coming today. Reports that she is happy to have someone help her with bathing. States that she is not use to having someone help her but it has been beneficial.  PLAN; Home visit planned for 12/27/2014.  Will plan to complete all assessment and determine needs.

## 2014-12-26 DIAGNOSIS — G4733 Obstructive sleep apnea (adult) (pediatric): Secondary | ICD-10-CM | POA: Diagnosis not present

## 2014-12-26 DIAGNOSIS — I442 Atrioventricular block, complete: Secondary | ICD-10-CM | POA: Diagnosis not present

## 2014-12-26 DIAGNOSIS — I5031 Acute diastolic (congestive) heart failure: Secondary | ICD-10-CM | POA: Diagnosis not present

## 2014-12-26 DIAGNOSIS — E785 Hyperlipidemia, unspecified: Secondary | ICD-10-CM | POA: Diagnosis not present

## 2014-12-26 DIAGNOSIS — I272 Other secondary pulmonary hypertension: Secondary | ICD-10-CM | POA: Diagnosis not present

## 2014-12-26 DIAGNOSIS — E1165 Type 2 diabetes mellitus with hyperglycemia: Secondary | ICD-10-CM | POA: Diagnosis not present

## 2014-12-27 ENCOUNTER — Other Ambulatory Visit: Payer: Self-pay

## 2014-12-27 DIAGNOSIS — E1165 Type 2 diabetes mellitus with hyperglycemia: Secondary | ICD-10-CM | POA: Diagnosis not present

## 2014-12-27 DIAGNOSIS — E785 Hyperlipidemia, unspecified: Secondary | ICD-10-CM | POA: Diagnosis not present

## 2014-12-27 DIAGNOSIS — G4733 Obstructive sleep apnea (adult) (pediatric): Secondary | ICD-10-CM | POA: Diagnosis not present

## 2014-12-27 DIAGNOSIS — I272 Other secondary pulmonary hypertension: Secondary | ICD-10-CM | POA: Diagnosis not present

## 2014-12-27 DIAGNOSIS — I442 Atrioventricular block, complete: Secondary | ICD-10-CM | POA: Diagnosis not present

## 2014-12-27 DIAGNOSIS — I5031 Acute diastolic (congestive) heart failure: Secondary | ICD-10-CM | POA: Diagnosis not present

## 2014-12-27 NOTE — Progress Notes (Signed)
Cardiology Office Note   Date:  12/29/2014   ID:  [name], DOB 07-Mar-1941, MRN [id number]  PCP:  Simona Huh, MD  Cardiologist:  Dr. Sallyanne Kuster  Post hospital follow up for hypoxemia   History of Present Illness: [name] is a 74 y.o. female with a history of CHB s/p St. Jude pacemaker, HTN, HLD, and diabetes but not CAD who was recently admitted to [facility] for acute on chronic diastolic CHF and ongoing hypoxemia.   Admitted from 12/06/14-12-30-2014. Her husband passed away about 2 weeks prior to admission from pneumonia. She came to see her PCP complaining of orthopnea, PND and weight gain. She was transferred to the emergency room and admitted for further evaluation and treatment.  She was clearly volume overloaded and was admitted for diuresis and further evaluation. A 2-D echocardiogram was performed, results are below. She had mild LVH and an EF of 50% with no wall motion abnormalities. As her weight decreased her shortness of breath improved, but she continued to have problems with hypoxia. Case management was consulted and she is being started on home oxygen.  Her pacemaker was interrogated and showed almost continuous ventricular pacing with intermittent native atrial rate and atrial pacing at times. She has good battery life and will follow-up as an outpatient for this.  She had problems with lower extremity weakness and was a fall risk. She was seen by physical therapy and consideration was given to inpatient cardiac rehabilitation. A PM&R consult was called. She was seen by Dr. Tessa Lerner who recommended home health versus skilled nursing facility placement. The patient refused skilled nursing facility placement as her husband had recently died in a nursing home. Family, including her daughter, were involved in the patient's care and stated they can provide 24/7 assistance at discharge. She will have home health RN, PT and aide.   A CCM consult was called because she had  continued oxygen dependence despite normal volume status. Dr. Nelda Marseille recommended a CT of the chest, which was performed, results below. It showed no PE or significant structural abnormality. She continued to have problems with oxygen desaturation, especially with activity and sleep. She will be on home oxygen at discharge. There was also consideration of BiPAP daily at bedtime upon discharge. She will need to follow-up with pulmonary M.D. when she is more recovered from her acute illness to determine if this is necessary. Once again skilled nursing facility placement was recommended but the patient continued to refuse. PFTs were performed.  The PFTs demonstrated a moderately severe airway obstruction and a concurrent restrictive process, no response to bronchodilators. She will not benefit from bronchodilators, and further studies with exercise can be considered as an outpatient to evaluate for hypoxemia.  She continued to have problems with dyspnea on exertion and hypoxemia despite diuresis, a right and left heart cath was performed on 12/14/2014. Results are below, she had no significant CAD, her PA pressures were 38/16 and her mean wedge was 10. Medical management was recommended.  On 2014-12-30, she was seen by Dr. Acie Fredrickson and all data were reviewed. She was seen by case management and arrangements were made for outpatient assistance. No medical equipment was needed. She is to continue her current dose of beta blocker, Lasix and lisinopril.  Today she presents for outpatient follow up. She is feeling quite well. She wears her 02 almost continuoulsy. No CP. SOB improved on 02. She does not have pulmonology follow up set up. She monitors her weights daily  and has been taking all of her mediations. No LE edema, orthopnea, PND, lightheadedness or dizziness.   Past Medical History  Diagnosis Date  . Hypertension   . Hypercholesterolemia   . Pacemaker   . Dysrhythmia   . Neuropathy of both feet   .  Shortness of breath     exertional  . Venous insufficiency (chronic) (peripheral)   . CHB (complete heart block) 09/19/2013  . HTN (hypertension) 09/19/2013  . Hyperlipidemia 09/19/2013  . Paralysis of right hand 2007    "after spinal cord injury/OR"  . CHF (congestive heart failure) dx'd 12/06/2014  . Coronary artery disease     normal coronaries by 09/10/00 cath  . Myocardial infarction dx'd 2009  . Diabetes mellitus     hga1c 7.1 no meds  . Type II diabetes mellitus     "had it years ago; lost weight after neck OR in 2007; glucose went way down; recently dx'd as returned recently" (12/07/2014)  . Arthritis     "hands, spine" (12/07/2014)  . Chronic pain     "since neck OR in 2007"  . Urinary incontinence     "since 2007's OR"    Past Surgical History  Procedure Laterality Date  . Cardiac pacemaker placement  12/01/2007    St.Jude Zephyr XLDR #[id number], dual chamber  . Anterior cervical decomp/discectomy fusion  2007  . Lumbar laminectomy/decompression microdiscectomy  03/02/2012    Procedure: LUMBAR LAMINECTOMY/DECOMPRESSION MICRODISCECTOMY 2 LEVELS;  Surgeon: Floyce Stakes, MD;  Location: [facility];  Service: Neurosurgery;  Laterality: Bilateral;  Lumbar three, lumbar four, lumbar five Laminectomy/Foraminotomy  . Transthoracic echocardiogram  11/30/2007    normal echo, mean transaortic valve gradient 37mmHg  . Nm myocar perf wall motion  10/24/2008    protocol:Lexiscan, EF63%, normal study, scan negative for ischemia  . Cardiovascular stress test  08/28/2000    Cardiolite perfusion study, EF58%, low risk study,   . Lower ext. dopplers  06/12/2011    no evidence of thrombus, all vessels normal in size, no evidence of insuffiency  . Back surgery    . Laparoscopic cholecystectomy  1995  . Knee arthroscopy Right   . Dilation and curettage of uterus  1960's  . Cardiac catheterization  09/10/2000    normal Coronary arteries, EF60%,   . Left and right heart catheterization with  coronary angiogram N/A 12/14/2014    Procedure: LEFT AND RIGHT HEART CATHETERIZATION WITH CORONARY ANGIOGRAM;  Surgeon: Peter M Martinique, MD;  Location: [facility] CATH LAB;  Service: Cardiovascular;  Laterality: N/A;     Current Outpatient Prescriptions  Medication Sig Dispense Refill  . acetaminophen (TYLENOL) 500 MG tablet Take 500 mg by mouth 4 (four) times daily -  with meals and at bedtime.    Marland Kitchen aspirin EC 81 MG tablet Take 81 mg by mouth daily.    Marland Kitchen atenolol (TENORMIN) 25 MG tablet Take 0.5 tablets (12.5 mg total) by mouth daily.    . furosemide (LASIX) 40 MG tablet Take 1 tablet (40 mg total) by mouth daily. 30 tablet 6  . gabapentin (NEURONTIN) 300 MG capsule Take 300 mg by mouth 3 (three) times daily.    Marland Kitchen lisinopril (PRINIVIL,ZESTRIL) 5 MG tablet Take 1 tablet (5 mg total) by mouth daily. 30 tablet 11  . metFORMIN (GLUCOPHAGE-XR) 500 MG 24 hr tablet Take 500 mg by mouth daily.  3  . Multiple Vitamins-Minerals (MULTIVITAMIN PO) Take by mouth daily.    . potassium chloride SA (K-DUR,KLOR-CON) 20 MEQ tablet Take  1 tablet (20 mEq total) by mouth daily. 30 tablet 6  . pravastatin (PRAVACHOL) 40 MG tablet Take 40 mg by mouth daily.     No current facility-administered medications for this visit.    Allergies:   Review of patient's allergies indicates no known allergies.    Social History:  The patient  reports that she quit smoking about 22 years ago. Her smoking use included Cigarettes. She has a 40 pack-year smoking history. She has never used smokeless tobacco. She reports that she does not drink alcohol or use illicit drugs.   Family History:  The patient's family history includes Diabetes in her father and mother; Heart attack in her father; Heart disease in her father and mother; Hyperlipidemia in her father and mother; Hypertension in her father and mother.    ROS:  Please see the history of present illness.   Otherwise, review of systems are positive for none.   All other systems are  reviewed and negative.    PHYSICAL EXAM: VS:  BP 92/42 mmHg  Pulse 70  Ht 5\' 7"  (1.702 m)  Wt 203 lb 12.8 oz (92.443 kg)  BMI 31.91 kg/m2 , BMI Body mass index is 31.91 kg/(m^2). GEN: Well nourished, well developed, in no acute distress. obese HEENT: normal Neck: no JVD, carotid bruits, or masses Cardiac: RRR; no murmurs, rubs, or gallops,no edema  Respiratory:  clear to auscultation bilaterally, normal work of breathing GI: soft, nontender, nondistended, + BS MS: no deformity or atrophy Skin: warm and dry, no rash Neuro:  Strength and sensation are intact Psych: euthymic mood, full affect   EKG:  EKG is not ordered today.    Recent Labs: 12/09/2014: B Natriuretic Peptide 36.1 12/15/2014: BUN 17; Creatinine 0.64; Hemoglobin 12.4; Platelets 195; Potassium 4.6; Sodium 136    Lipid Panel    Component Value Date/Time   CHOL  11/30/2007 0510    158        ATP III CLASSIFICATION:  <200     mg/dL   Desirable  200-239  mg/dL   Borderline High  >=240    mg/dL   High   TRIG 174* 11/30/2007 0510   HDL 43 11/30/2007 0510   CHOLHDL 3.7 11/30/2007 0510   VLDL 35 11/30/2007 0510   LDLCALC  11/30/2007 0510    80        Total Cholesterol/HDL:CHD Risk Coronary Heart Disease Risk Table                     Men   Women  1/2 Average Risk   3.4   3.3      Wt Readings from Last 3 Encounters:  12/29/14 203 lb 12.8 oz (92.443 kg)  12/27/14 201 lb (91.173 kg)  12/25/14 200 lb (90.719 kg)      Other studies Reviewed: Additional studies/ records that were reviewed today include: RHC, CTA and 2D ECHO  Review of the above records demonstrates:  Cardiac Cath: 12/14/2014 Procedural Findings: Hemodynamics RA 8/5 mean 4 mm Hg RV 38/5/7 mm Hg PA 38/16 mean 24 mm Hg  PCWP 12/10 mean 10 mm Hg LV 144/14 mm Hg AO 138/59 mean 86 mm Hg Oxygen saturations: PA 68% AO 92% Cardiac Output (Fick) 6.25 L/min  Cardiac Index (Fick) 3.1 L/min/meter squared.  Coronary  angiography: Coronary dominance: right Left mainstem: Normal Left anterior descending (LAD): 30% stenosis at the takeoff of the first diagonal. Otherwise normal. Left circumflex (LCx): Normal Right coronary artery (RCA): Normal.  Left ventriculography: Left ventricular systolic function is normal, LVEF is estimated at 55-65%, there is no significant mitral regurgitation  Final Conclusions:  1. No significant CAD 2. Normal LV function.  3. Normal right heart pressures and low to normal LV filling pressures.  Recommendations: Medical management. Will DC IV lasix. Consider adding lower dose oral lasix in am.    Echo: 12/08/2014 Conclusions - Left ventricle: The cavity size was normal. Wall thickness was increased in a pattern of mild LVH. Systolic function was normal. The estimated ejection fraction was in the range of 50% to 55%. Wall motion was normal; there were no regional wall motion abnormalities. The study is not technically sufficient to allow evaluation of LV diastolic function. - Ventricular septum: Septal motion showed abnormal function and dyssynergy. - Aortic valve: There was trivial regurgitation. - Mitral valve: Calcified annulus. There was mild regurgitation. - Left atrium: The atrium was mildly dilated. Impressions: - Normal to mild global reduction in LV function (EF 50); mild LAE; mild MR; trace TR.    CLINICAL DATA: Dyspnea since 12/06/2014.  EXAM: CT ANGIOGRAPHY CHEST WITH CONTRAST  TECHNIQUE: Multidetector CT imaging of the chest was performed using the standard protocol during bolus administration of intravenous contrast. Multiplanar CT image reconstructions and MIPs were obtained to evaluate the vascular anatomy.  CONTRAST: 80 mL OMNIPAQUE IOHEXOL 350 MG/ML SOLN COMPARISON: Plain films of the chest 12/09/2014.  FINDINGS: No pulmonary embolus is identified. The right and left main pulmonary arteries are dilated,  approximately the same size as the ascending aorta compatible with pulmonary arterial hypertension. There is mild cardiomegaly. No pleural or pericardial effusion. Calcific aortic and coronary atherosclerosis is identified. A pacing device in place. There is no axillary, hilar or mediastinal lymphadenopathy. The lungs show linear atelectatic change in the right base. Visualized upper abdomen demonstrates no focal abnormality. No lytic or sclerotic bony lesion is seen. Review of the MIP images confirms the above findings. IMPRESSION: Negative for pulmonary embolus Mild cardiomegaly. Calcific aortic and coronary atherosclerosis. Findings compatible with pulmonary arterial hypertension.     ASSESSMENT AND PLAN:  [name] is a 74 y.o. female with a history of CHB s/p St. Jude pacemaker, HTN, HLD, and diabetes but not CAD who was recently admitted to [facility] for acute on chronic diastolic CHF and ongoing hypoxemia.   Chronic diastolic CHF- Discharge weight 206 lbs. Today she is 203 lbs. -- Continue atenelol 12.5mg  qd, lasix 40mg  qd, lisinopril 5mg .  -- BMET today  Hypoexemia- continue home 02. She will need pulmonology follow up. Will get a referral set up   CHB s/p St Jude PPM- she has pacer check scheduled on 01/16/15.   HLD- continue statin   HTN- her BP is low today at 92/42. I rechecked this in the room and confirmed. Will cut her atenolol in half to 12.5mg  qd. -- Continue lisinopril 5mg  and lasix 40mg  qd.  She will check her BP at home.    Current medicines are reviewed at length with the patient today.  The patient does not have concerns regarding medicines.  The following changes have been made:  Cut atenolol in half 25mg  --> 12.5 due to hypotension.    Labs/ tests ordered today include:   Orders Placed This Encounter  Procedures  . Basic Metabolic Panel (BMET)  . Ambulatory referral to Pulmonology    Disposition:   FU with Dr. Sallyanne Kuster in 3 months.     Judy Pimple, PA-C  12/29/2014 11:01 AM  [facility] [address], [place], [place]  [address] Phone: [phone]; Fax: [phone]

## 2014-12-27 NOTE — Patient Outreach (Signed)
[facility]) Care Management   12/27/2014  [name] 1941/07/13 [id number]  [name] is an 74 y.o. female  Arrived for home visit at [place]. No internet. Charting done after visit. Joint visit with patient, her [name] [name] and sister in Sports coach. Explained [place] program and updated consent.  Subjective: Patient reports that she is doing better this week.  States that she is starting to get some strength back. She continues to be active with home health physical therpay. Reports that she is doing her exercises daily. Currently is walking with a walker which is new for her.  Reports that she sat outside yesterday and read a book. Reports that she is taking her meds as prescribed. Reports weighing daily.  Patients [name] comes to stay with her 2 days per week. [name] lives in home in the back of patients property. [name] is available at night if needed. Patient reports that she gets up three times during the night to go to the potty chair. (Has 2 night lights.) Reports joint pain. States pain all the time. (knees, back neck) Currently reports taking Tylenol with takes off the edge.  Objective:  Awake and alert.  Ambulating with walker. Lungs clear.  Patient actively engaged in home visit. Wearing oxygen.   Filed Vitals:   12/27/14 0945  BP: 136/78  Pulse: 70  Resp: 20  Height: 1.702 m (5\' 7" )  Weight: 201 lb (91.173 kg)  SpO2: 98%   Review of Systems  Respiratory: Negative for shortness of breath.   Cardiovascular: Negative for chest pain and leg swelling.    Physical Exam  Constitutional: She appears well-developed and well-nourished.  Cardiovascular: Normal rate.   Respiratory: Effort normal and breath sounds normal.  GI: Soft. Bowel sounds are normal.  Musculoskeletal: She exhibits no edema.  Skin: Skin is warm and dry.  Psychiatric: She has a normal mood and affect. Her behavior is normal. Judgment and thought content normal.    Current Medications:   Current  Outpatient Prescriptions  Medication Sig Dispense Refill  . aspirin EC 81 MG tablet Take 81 mg by mouth daily.    Marland Kitchen atenolol (TENORMIN) 25 MG tablet Take 25 mg by mouth daily.    . furosemide (LASIX) 40 MG tablet Take 1 tablet (40 mg total) by mouth daily. 30 tablet 6  . gabapentin (NEURONTIN) 300 MG capsule Take 300 mg by mouth 3 (three) times daily.    Marland Kitchen lisinopril (PRINIVIL,ZESTRIL) 5 MG tablet Take 1 tablet (5 mg total) by mouth daily. 30 tablet 11  . potassium chloride SA (K-DUR,KLOR-CON) 20 MEQ tablet Take 1 tablet (20 mEq total) by mouth daily. 30 tablet 6  . pravastatin (PRAVACHOL) 40 MG tablet Take 40 mg by mouth daily.    . metFORMIN (GLUCOPHAGE-XR) 500 MG 24 hr tablet Take 500 mg by mouth daily.  3  . Multiple Vitamins-Minerals (MULTIVITAMIN PO) Take by mouth daily.     No current facility-administered medications for this visit.    Functional Status:   In your present state of health, do you have any difficulty performing the following activities: 12/27/2014 12/07/2014  Hearing? Tempie Donning  Vision? Y Y  Difficulty concentrating or making decisions? N N  Walking or climbing stairs? Y Y  Dressing or bathing? - N  Doing errands, shopping? Y -  Conservation officer, nature and eating ? N -  Using the Toilet? N -  In the past six months, have you accidently leaked urine? Y -  Managing  your Medications? N -  Managing your Finances? N -  Housekeeping or managing your Housekeeping? N -    Fall/Depression Screening:    PHQ 2/9 Scores 12/27/2014  PHQ - 2 Score 1   THN CM Care Plan        Patient Outreach from 12/27/2014 in [place] for Problem One  Active   Interventions for Problem One Long Term Goal  -- [Discussed importance of daily weights, provided THN calendar]   THN Long Term Goal (31-90 days)  Patient will be able to report no readmission in the next 31 days.   THN Long Term Goal Start Date  12/18/14   THN CM Short Term Goal #1 (0-30 days)  Patient will be able to  verbalize daily weights in the next 30 days.   THN CM Short Term Goal #1 Start Date  12/18/14   THN CM Short Term Goal #2 (0-30 days)  Patient will be able to report following low salt diet for 30 days.   THN CM Short Term Goal #2 Start Date  12/18/14     Assessment:   (1) takes meds as prescribed.  Uses a pill box. (2) No advanced directives. (3) equipment used in the home;  Walker, cane, oxygen, pulse ox, incentive spirometer, potty chair, shower chair,  Non-slid mats, hand rails, (4) Active with home health, bath aid and physical therapy. (5) Managing heart failure well at this time. Daily weights, low salt diet. Using incentive spirometer 3 times a day. Current volume of 1250 (6) Recent loss of husband. (7) joint pain 7/10  Plan:  (1) Patient will continue to take meds as prescribed.  No questions or problems with meds noted. (2) provided advance directive packet and encouraged patient to complete. (3) patient will continue to use safety devices. Discussed with patient to be cautious of tripping over oxygen tubing. (4) patient will continue to follow home exercise program. (5) Patient will continue to weigh daily , follow a low salt diet, and review CHF zones. Patient agreed to call MD for any questions or concerns. Patient will continue to use incentive spirometer 3 times a day.  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 Reports that she is dealing well with death of husband. Family offering support. (7) Patient currently taking tylenol for pain will inquire with MD if able to take other over the counter meds like aleve or motrin for pain relief.   Patient will continue to followed for transition of care for 2 more weeks and then reassessed for monthly home visits.  Patient with good family support. Collaborative care planning and goal setting and priority goal to avoid readmission to hospital.  Tomasa Rand, RN, BSN, CEN [employer]  [facility] [phone]

## 2014-12-28 ENCOUNTER — Other Ambulatory Visit: Payer: Self-pay

## 2014-12-28 DIAGNOSIS — I442 Atrioventricular block, complete: Secondary | ICD-10-CM | POA: Diagnosis not present

## 2014-12-28 DIAGNOSIS — E1165 Type 2 diabetes mellitus with hyperglycemia: Secondary | ICD-10-CM | POA: Diagnosis not present

## 2014-12-28 DIAGNOSIS — G4733 Obstructive sleep apnea (adult) (pediatric): Secondary | ICD-10-CM | POA: Diagnosis not present

## 2014-12-28 DIAGNOSIS — E785 Hyperlipidemia, unspecified: Secondary | ICD-10-CM | POA: Diagnosis not present

## 2014-12-28 DIAGNOSIS — I272 Other secondary pulmonary hypertension: Secondary | ICD-10-CM | POA: Diagnosis not present

## 2014-12-28 DIAGNOSIS — I5031 Acute diastolic (congestive) heart failure: Secondary | ICD-10-CM | POA: Diagnosis not present

## 2014-12-28 NOTE — Patient Outreach (Signed)
MD office called and left a voice mail and suggested that patient take Aleve 1 tablet twice a day with food. Will inform patient.

## 2014-12-28 NOTE — Patient Outreach (Signed)
Called patient to inform her that primary MD suggested Aleve 1 tablet twice a day with food.  Explained these instructions to patient and she verbalized understanding. Patient thankful for help.

## 2014-12-28 NOTE — Patient Outreach (Signed)
Call MD and spoke with Arbie Cookey to inquire about meds for pain like aleve or motrin. She will send MD a note and someone will call me back.

## 2014-12-29 ENCOUNTER — Ambulatory Visit (INDEPENDENT_AMBULATORY_CARE_PROVIDER_SITE_OTHER): Payer: Medicare Other | Admitting: Physician Assistant

## 2014-12-29 ENCOUNTER — Encounter: Payer: Self-pay | Admitting: Physician Assistant

## 2014-12-29 VITALS — BP 92/42 | HR 70 | Ht 67.0 in | Wt 203.8 lb

## 2014-12-29 DIAGNOSIS — R0902 Hypoxemia: Secondary | ICD-10-CM

## 2014-12-29 DIAGNOSIS — I509 Heart failure, unspecified: Secondary | ICD-10-CM | POA: Diagnosis not present

## 2014-12-29 LAB — BASIC METABOLIC PANEL
BUN: 24 mg/dL — ABNORMAL HIGH (ref 6–23)
CALCIUM: 9.8 mg/dL (ref 8.4–10.5)
CO2: 31 mEq/L (ref 19–32)
CREATININE: 0.67 mg/dL (ref 0.40–1.20)
Chloride: 98 mEq/L (ref 96–112)
GFR: 91.1 mL/min (ref >60.00)
Glucose, Bld: 117 mg/dL — ABNORMAL HIGH (ref 70–99)
Potassium: 4.6 mEq/L (ref 3.5–5.1)
SODIUM: 134 meq/L — AB (ref 135–145)

## 2014-12-29 MED ORDER — ATENOLOL 25 MG PO TABS: 12.5000 mg | ORAL_TABLET | Freq: Every day | ORAL | Status: DC

## 2014-12-29 NOTE — Patient Instructions (Addendum)
Medication Instructions:  START TAKING 12.5 MG ATENOLOL  Labwork: BMET  Testing/Procedures: NONE AT THIS TIME  Follow-Up: FOLLOW UP WITH DR Jasper Riling IN 3 MONTHS   You have been referred to PULMONARY WITH DR Nelda Marseille   Any Other Special Instructions Will Be Listed Below (If Applicable).

## 2015-01-01 ENCOUNTER — Telehealth: Payer: Self-pay | Admitting: *Deleted

## 2015-01-01 DIAGNOSIS — I5031 Acute diastolic (congestive) heart failure: Secondary | ICD-10-CM | POA: Diagnosis not present

## 2015-01-01 DIAGNOSIS — I442 Atrioventricular block, complete: Secondary | ICD-10-CM | POA: Diagnosis not present

## 2015-01-01 DIAGNOSIS — E785 Hyperlipidemia, unspecified: Secondary | ICD-10-CM | POA: Diagnosis not present

## 2015-01-01 DIAGNOSIS — E1165 Type 2 diabetes mellitus with hyperglycemia: Secondary | ICD-10-CM | POA: Diagnosis not present

## 2015-01-01 DIAGNOSIS — I272 Other secondary pulmonary hypertension: Secondary | ICD-10-CM | POA: Diagnosis not present

## 2015-01-01 DIAGNOSIS — G4733 Obstructive sleep apnea (adult) (pediatric): Secondary | ICD-10-CM | POA: Diagnosis not present

## 2015-01-01 NOTE — Telephone Encounter (Signed)
Signed orders faxed for evaluation and treatment to [place].

## 2015-01-02 DIAGNOSIS — I5031 Acute diastolic (congestive) heart failure: Secondary | ICD-10-CM | POA: Diagnosis not present

## 2015-01-02 DIAGNOSIS — G4733 Obstructive sleep apnea (adult) (pediatric): Secondary | ICD-10-CM | POA: Diagnosis not present

## 2015-01-02 DIAGNOSIS — E1165 Type 2 diabetes mellitus with hyperglycemia: Secondary | ICD-10-CM | POA: Diagnosis not present

## 2015-01-02 DIAGNOSIS — I272 Other secondary pulmonary hypertension: Secondary | ICD-10-CM | POA: Diagnosis not present

## 2015-01-02 DIAGNOSIS — E785 Hyperlipidemia, unspecified: Secondary | ICD-10-CM | POA: Diagnosis not present

## 2015-01-02 DIAGNOSIS — I442 Atrioventricular block, complete: Secondary | ICD-10-CM | POA: Diagnosis not present

## 2015-01-03 ENCOUNTER — Other Ambulatory Visit: Payer: Self-pay

## 2015-01-03 ENCOUNTER — Telehealth: Payer: Self-pay | Admitting: *Deleted

## 2015-01-03 DIAGNOSIS — I272 Other secondary pulmonary hypertension: Secondary | ICD-10-CM | POA: Diagnosis not present

## 2015-01-03 DIAGNOSIS — E785 Hyperlipidemia, unspecified: Secondary | ICD-10-CM | POA: Diagnosis not present

## 2015-01-03 DIAGNOSIS — G4733 Obstructive sleep apnea (adult) (pediatric): Secondary | ICD-10-CM | POA: Diagnosis not present

## 2015-01-03 DIAGNOSIS — I442 Atrioventricular block, complete: Secondary | ICD-10-CM | POA: Diagnosis not present

## 2015-01-03 DIAGNOSIS — E1165 Type 2 diabetes mellitus with hyperglycemia: Secondary | ICD-10-CM | POA: Diagnosis not present

## 2015-01-03 DIAGNOSIS — I5031 Acute diastolic (congestive) heart failure: Secondary | ICD-10-CM | POA: Diagnosis not present

## 2015-01-03 NOTE — Patient Outreach (Signed)
Transition of care call week 3.  Patient reports that she is doing well. States that she has been weighing daily. Current weight today is 199 pounds. Reports that she is doing her exercises.  Has started taking Aleve 1 tablet twice a day for pain.  Reports pain today is 6/10.    No new problems or concerns.   Plan: Will continue transition of care calls.  Filed Vitals:   01/03/15 1130  Weight: 199 lb (90.266 kg)    THN CM Care Plan        Most Recent Value   Problem One    Care Plan Problem One  Patient with recent admission related to heart failure.   Role Documenting the Problem One  Care Management Coordinator   EMMI for Problem One  Yes [printed material  x 6 programs]   [facility] CM Care Plan Problem One     Care Plan for Problem One  Active   Patient Has Long Term Goal?  Yes   THN Long Term Goal (31-90 days)  Patient will be able to report no readmission in the next 31 days.   THN Long Term Goal Start Date  12/18/14   Interventions for Problem One Long Term Goal  -- [Discussed importance of daily weights, provided THN calendar]   Problem One Short Term Goals    Number of Short Term Goals for Problem One  One, Two   Short Term Goal #1    THN CM Short Term Goal #1 (0-30 days)  Patient will be able to verbalize daily weights in the next 30 days. [patient is weighing daily]   THN CM Short Term Goal #1 Start Date  12/18/14   Interventions for Short Term Goal #1  -- [reviewed heart failure zones]   Short Term Goal #2    THN CM Short Term Goal #2 (0-30 days)  Patient will be able to report following low salt diet for 30 days. [patient is following a low salt diet]   THN CM Short Term Goal #2 Start Date  12/18/14   Interventions for Short Term Goal #2  -- [patient is currently following low salt diet. Emmi provided]   Short Term Goal #3    Short Term Goal #4    Short Tern Goal #5    Problem Two    THN CM Care Plan Problem Two    Problem Two Short Term Goals    Short Term Goal #1    Short Term Goal #2    Short Term Goal #3    Short Term Goal #4    Short Term Goal #5    Problem Three    THN CM Care Plan Problem Three    Problem Three Short Term Goals    Short Term Goal #1    Short Term Goal #2    Short Term Goal #3    Short Term Goal #4    Short Term Goal #5      Tomasa Rand, RN, BSN, CEN [facility] [employer] [phone]

## 2015-01-03 NOTE — Telephone Encounter (Signed)
-----   Message from Eileen Stanford, PA-C sent at 12/29/2014  3:31 PM EDT ----- Labs look good. No change in plans. Thank you

## 2015-01-04 DIAGNOSIS — E1165 Type 2 diabetes mellitus with hyperglycemia: Secondary | ICD-10-CM | POA: Diagnosis not present

## 2015-01-04 DIAGNOSIS — I442 Atrioventricular block, complete: Secondary | ICD-10-CM | POA: Diagnosis not present

## 2015-01-04 DIAGNOSIS — G4733 Obstructive sleep apnea (adult) (pediatric): Secondary | ICD-10-CM | POA: Diagnosis not present

## 2015-01-04 DIAGNOSIS — I272 Other secondary pulmonary hypertension: Secondary | ICD-10-CM | POA: Diagnosis not present

## 2015-01-04 DIAGNOSIS — I5031 Acute diastolic (congestive) heart failure: Secondary | ICD-10-CM | POA: Diagnosis not present

## 2015-01-04 DIAGNOSIS — E785 Hyperlipidemia, unspecified: Secondary | ICD-10-CM | POA: Diagnosis not present

## 2015-01-05 DIAGNOSIS — I5031 Acute diastolic (congestive) heart failure: Secondary | ICD-10-CM | POA: Diagnosis not present

## 2015-01-05 DIAGNOSIS — I272 Other secondary pulmonary hypertension: Secondary | ICD-10-CM | POA: Diagnosis not present

## 2015-01-05 DIAGNOSIS — I442 Atrioventricular block, complete: Secondary | ICD-10-CM | POA: Diagnosis not present

## 2015-01-05 DIAGNOSIS — E785 Hyperlipidemia, unspecified: Secondary | ICD-10-CM | POA: Diagnosis not present

## 2015-01-05 DIAGNOSIS — E1165 Type 2 diabetes mellitus with hyperglycemia: Secondary | ICD-10-CM | POA: Diagnosis not present

## 2015-01-05 DIAGNOSIS — G4733 Obstructive sleep apnea (adult) (pediatric): Secondary | ICD-10-CM | POA: Diagnosis not present

## 2015-01-08 DIAGNOSIS — E1165 Type 2 diabetes mellitus with hyperglycemia: Secondary | ICD-10-CM | POA: Diagnosis not present

## 2015-01-08 DIAGNOSIS — G4733 Obstructive sleep apnea (adult) (pediatric): Secondary | ICD-10-CM | POA: Diagnosis not present

## 2015-01-08 DIAGNOSIS — I272 Other secondary pulmonary hypertension: Secondary | ICD-10-CM | POA: Diagnosis not present

## 2015-01-08 DIAGNOSIS — I442 Atrioventricular block, complete: Secondary | ICD-10-CM | POA: Diagnosis not present

## 2015-01-08 DIAGNOSIS — E785 Hyperlipidemia, unspecified: Secondary | ICD-10-CM | POA: Diagnosis not present

## 2015-01-08 DIAGNOSIS — I5031 Acute diastolic (congestive) heart failure: Secondary | ICD-10-CM | POA: Diagnosis not present

## 2015-01-09 ENCOUNTER — Ambulatory Visit (INDEPENDENT_AMBULATORY_CARE_PROVIDER_SITE_OTHER): Payer: Medicare Other | Admitting: Cardiovascular Disease

## 2015-01-09 ENCOUNTER — Encounter: Payer: Self-pay | Admitting: Cardiovascular Disease

## 2015-01-09 VITALS — BP 110/68 | HR 68 | Resp 16 | Ht 67.0 in | Wt 203.1 lb

## 2015-01-09 DIAGNOSIS — I442 Atrioventricular block, complete: Secondary | ICD-10-CM | POA: Diagnosis not present

## 2015-01-09 DIAGNOSIS — I110 Hypertensive heart disease with heart failure: Secondary | ICD-10-CM

## 2015-01-09 DIAGNOSIS — I10 Essential (primary) hypertension: Secondary | ICD-10-CM | POA: Diagnosis not present

## 2015-01-09 DIAGNOSIS — Z950 Presence of cardiac pacemaker: Secondary | ICD-10-CM | POA: Diagnosis not present

## 2015-01-09 DIAGNOSIS — I5030 Unspecified diastolic (congestive) heart failure: Secondary | ICD-10-CM

## 2015-01-09 DIAGNOSIS — IMO0001 Reserved for inherently not codable concepts without codable children: Secondary | ICD-10-CM

## 2015-01-09 DIAGNOSIS — Z0389 Encounter for observation for other suspected diseases and conditions ruled out: Secondary | ICD-10-CM

## 2015-01-09 LAB — CUP PACEART INCLINIC DEVICE CHECK
Battery Impedance: 1800 Ohm
Brady Statistic RA Percent Paced: 50 %
Brady Statistic RV Percent Paced: 99 %
Date Time Interrogation Session: 20160510100840
Lead Channel Impedance Value: 430 Ohm
Lead Channel Impedance Value: 522 Ohm
Lead Channel Pacing Threshold Amplitude: 0.625 V
Lead Channel Pacing Threshold Pulse Width: 0.5 ms
Lead Channel Sensing Intrinsic Amplitude: 1.6 mV
Lead Channel Setting Pacing Amplitude: 2 V
MDC IDC MSMT BATTERY VOLTAGE: 2.79 V
MDC IDC MSMT LEADCHNL RA PACING THRESHOLD AMPLITUDE: 0.75 V
MDC IDC MSMT LEADCHNL RV PACING THRESHOLD PULSEWIDTH: 0.5 ms
MDC IDC SET LEADCHNL RV PACING PULSEWIDTH: 0.5 ms
MDC IDC SET LEADCHNL RV SENSING SENSITIVITY: 4 mV
Pulse Gen Model: 5826
Pulse Gen Serial Number: 1904939

## 2015-01-09 NOTE — Patient Instructions (Addendum)
Your physician recommends that you schedule a follow-up appointment in: 6 months with Dr.[name] with a pacemaker check (ST Jude).

## 2015-01-09 NOTE — Progress Notes (Signed)
Patient [name]: [name], female   DOB: [birth year], 74 y.o.   MRN: [id number]     Cardiology Office Note   Date:  01/09/2015   [name]:  [name], DOB July 23, 1941, MRN [id number]  PCP:  Simona Huh, MD  Cardiologist:   Sanda Klein, MD   Chief Complaint  Patient presents with  . Follow-up    [place] VISIT.  Occas. twinge around pacemaker site.  Minimal swelling.       History of Present Illness: [name] is a 74 y.o. female who presents for follow up after hospitalization with accelerated hypertension with diastolic congestive heart failure, NYHA class 3 that occurred about 2 weeks after her husband passed away. Echo showed LVEF 50%, CT chest showed no evidence of PE, cardiac cath showed no significant CAD (30% stenosis at the takeoff of the first diagonal) and mild pulmonary HTN (38/16 when PAWP was 10), PFTs demonstrated moderately severe airway obstruction and a concurrent restrictive process, no response to bronchodilators. She improved with diuresis - weight 206 pounds at discharge, but is still on oxygen supplementation.  She is pacemaker dependent with complete heart block since 2009. Comprehensive check of her pacemaker in the office today shows normal function and no evidence of significant arrhythmia. Her device is a Radio broadcast assistant that is not capable of remote monitoring. Battery longevity is estimated at 3.5-4.75 years. Roughly 50% atrial pacing and 100% ventricular pacing. No meaningful tachyarrhythmia has been recorded ( 8 episodes of SVT all < 20 seconds).  She is well treated for hypertension and hyperlipidemia. She sees Dr. Scot Dock for problems with varicose veins of the lower extremities.   Past Medical History  Diagnosis Date  . Hypertension   . Hypercholesterolemia   . Pacemaker   . Dysrhythmia   . Neuropathy of both feet   . Shortness of breath     exertional  . Venous insufficiency (chronic) (peripheral)   . CHB (complete heart  block) 09/19/2013  . HTN (hypertension) 09/19/2013  . Hyperlipidemia 09/19/2013  . Paralysis of right hand 2007    "after spinal cord injury/OR"  . CHF (congestive heart failure) dx'd 12/06/2014  . Coronary artery disease     normal coronaries by 09/10/00 cath  . Myocardial infarction dx'd 2009  . Diabetes mellitus     hga1c 7.1 no meds  . Type II diabetes mellitus     "had it years ago; lost weight after neck OR in 2007; glucose went way down; recently dx'd as returned recently" (12/07/2014)  . Arthritis     "hands, spine" (12/07/2014)  . Chronic pain     "since neck OR in 2007"  . Urinary incontinence     "since 2007's OR"    Past Surgical History  Procedure Laterality Date  . Cardiac pacemaker placement  12/01/2007    St.Jude Zephyr XLDR #[id number], dual chamber  . Anterior cervical decomp/discectomy fusion  2007  . Lumbar laminectomy/decompression microdiscectomy  03/02/2012    Procedure: LUMBAR LAMINECTOMY/DECOMPRESSION MICRODISCECTOMY 2 LEVELS;  Surgeon: Floyce Stakes, MD;  Location: [place] NEURO ORS;  Service: Neurosurgery;  Laterality: Bilateral;  Lumbar three, lumbar four, lumbar five Laminectomy/Foraminotomy  . Transthoracic echocardiogram  11/30/2007    normal echo, mean transaortic valve gradient 34mmHg  . Nm myocar perf wall motion  10/24/2008    protocol:Lexiscan, EF63%, normal study, scan negative for ischemia  . Cardiovascular stress test  08/28/2000    Cardiolite perfusion study, EF58%, low risk study,   .  Lower ext. dopplers  06/12/2011    no evidence of thrombus, all vessels normal in size, no evidence of insuffiency  . Back surgery    . Laparoscopic cholecystectomy  1995  . Knee arthroscopy Right   . Dilation and curettage of uterus  1960's  . Cardiac catheterization  09/10/2000    normal Coronary arteries, EF60%,   . Left and right heart catheterization with coronary angiogram N/A 12/14/2014    Procedure: LEFT AND RIGHT HEART CATHETERIZATION WITH CORONARY ANGIOGRAM;   Surgeon: Peter M Martinique, MD;  Location[facility] CATH LAB;  Service: Cardiovascular;  Laterality: N/A;     Current Outpatient Prescriptions  Medication Sig Dispense Refill  . acetaminophen (TYLENOL) 500 MG tablet Take 500 mg by mouth 4 (four) times daily -  with meals and at bedtime.    Marland Kitchen aspirin EC 81 MG tablet Take 81 mg by mouth daily.    Marland Kitchen atenolol (TENORMIN) 25 MG tablet Take 0.5 tablets (12.5 mg total) by mouth daily.    . furosemide (LASIX) 40 MG tablet Take 1 tablet (40 mg total) by mouth daily. 30 tablet 6  . gabapentin (NEURONTIN) 300 MG capsule Take 300 mg by mouth 3 (three) times daily.    Marland Kitchen lisinopril (PRINIVIL,ZESTRIL) 5 MG tablet Take 1 tablet (5 mg total) by mouth daily. 30 tablet 11  . Multiple Vitamins-Minerals (MULTIVITAMIN PO) Take by mouth daily.    . naproxen sodium (ANAPROX) 220 MG tablet Take 220 mg by mouth 2 (two) times daily with a meal.    . potassium chloride SA (K-DUR,KLOR-CON) 20 MEQ tablet Take 1 tablet (20 mEq total) by mouth daily. 30 tablet 6  . pravastatin (PRAVACHOL) 40 MG tablet Take 40 mg by mouth daily.    . metFORMIN (GLUCOPHAGE-XR) 500 MG 24 hr tablet Take 500 mg by mouth daily.  3   No current facility-administered medications for this visit.    Allergies:   Review of patient's allergies indicates no known allergies.    Social History:  The patient  reports that she quit smoking about 22 years ago. Her smoking use included Cigarettes. She has a 40 pack-year smoking history. She has never used smokeless tobacco. She reports that she does not drink alcohol or use illicit drugs.   Family History:  The patient's family history includes Diabetes in her father and mother; Heart attack in her father; Heart disease in her father and mother; Hyperlipidemia in her father and mother; Hypertension in her father and mother.    ROS:  Please see the history of present illness.    Otherwise, review of systems positive for twinge in left arm when she lies back in  bed, mild ankle swelling.   All other systems are reviewed and negative.    PHYSICAL EXAM: VS:  BP 110/68 mmHg  Pulse 68  Ht 5\' 7"  (1.702 m)  Wt 203 lb 1.6 oz (92.126 kg)  BMI 31.80 kg/m2 , BMI Body mass index is 31.8 kg/(m^2).  General: Alert, oriented x3, no distress Head: no evidence of trauma, PERRL, EOMI, no exophtalmos or lid lag, no myxedema, no xanthelasma; normal ears, nose and oropharynx Neck: normal jugular venous pulsations and no hepatojugular reflux; brisk carotid pulses without delay and no carotid bruits Chest: clear to auscultation, no signs of consolidation by percussion or palpation, normal fremitus, symmetrical and full respiratory excursions Cardiovascular: normal position and quality of the apical impulse, regular rhythm, normal first and second heart sounds, no murmurs, rubs or gallops Abdomen: no  tenderness or distention, no masses by palpation, no abnormal pulsatility or arterial bruits, normal bowel sounds, no hepatosplenomegaly Extremities: no clubbing, cyanosis or edema; 2+ radial, ulnar and brachial pulses bilaterally; 2+ right femoral, posterior tibial and dorsalis pedis pulses; 2+ left femoral, posterior tibial and dorsalis pedis pulses; no subclavian or femoral bruits Neurological: grossly nonfocal Psych: euthymic mood, full affect   EKG:  EKG is not ordered today.   Recent Labs: 12/09/2014: B Natriuretic Peptide 36.1 12/15/2014: Hemoglobin 12.4; Platelets 195 12/29/2014: BUN 24*; Creatinine 0.67; Potassium 4.6; Sodium 134*    Lipid Panel    Component Value Date/Time   CHOL  11/30/2007 0510    158        ATP III CLASSIFICATION:  <200     mg/dL   Desirable  200-239  mg/dL   Borderline High  >=240    mg/dL   High   TRIG 174* 11/30/2007 0510   HDL 43 11/30/2007 0510   CHOLHDL 3.7 11/30/2007 0510   VLDL 35 11/30/2007 0510   LDLCALC  11/30/2007 0510    80        Total Cholesterol/HDL:CHD Risk Coronary Heart Disease Risk Table                      Men   Women  1/2 Average Risk   3.4   3.3      Wt Readings from Last 3 Encounters:  01/09/15 203 lb 1.6 oz (92.126 kg)  01/03/15 199 lb (90.266 kg)  12/29/14 203 lb 12.8 oz (92.443 kg)      Other studies Reviewed: Additional studies/ records that were reviewed today include: cath lab report and images, echo report and images, DC summary and full pacemaker check.  ASSESSMENT AND PLAN:  1.  Resolved acute diastolic heart failure 2. Complete heart block, pacemaker dependent 3. Normal dual chamber pacemaker function 4. Minor, asymptomatic CAD 5. HTN, excellent today 6. Hyperlipidemia on statin 7. 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  The patient has concerns regarding medicines.  The following changes have been made:  no change  Labs/ tests ordered today include:  No orders of the defined types were placed in this encounter.   Patient Instructions  Your physician recommends that you schedule a follow-up appointment in: 6 months with Dr.Trevar Boehringer with a pacemaker check (ST Jude).    Mikael Spray, MD  01/09/2015 9:12 AM    Sanda Klein, MD, [facility] [phone] office [phone] pager

## 2015-01-10 ENCOUNTER — Ambulatory Visit: Payer: Medicare Other

## 2015-01-10 ENCOUNTER — Encounter: Payer: Self-pay | Admitting: Cardiovascular Disease

## 2015-01-10 ENCOUNTER — Other Ambulatory Visit: Payer: Self-pay

## 2015-01-10 NOTE — Patient Outreach (Signed)
Transition of care call week 3.  Patient reports today she is doing well. States that she continues to be short of breath with activity. Reports today weight of 197 pounds. ( 2 pound weight loss since last week) Reports that she is recording weights daily. Reports that she continues to follow her low salt diet.  Patient reports that she went to the grocery store yesterday and rode the scooter. Felt better to get out of the house.   Reports that ALEVE that she is taking for joint pain has not helped.  Encouraged patient to discuss with primary MD.  PLAN: Will continue transition of care calls for another week and then reassess needs for home visits.  [facility] CM Care Plan Problem One        Patient Outreach Telephone from 01/10/2015 in [place] Problem One  Patient with recent admission related to heart failure.   Care Plan for Problem One  Active   THN Long Term Goal (31-90 days)  Patient will be able to report no readmission in the next 31 days.   THN Long Term Goal Start Date  12/18/14   Interventions for Problem One Long Term Goal  Explained importance of timely follow up with MD.  Explained importance of daily heart failure management like weights and low salt diet. [Discussed importance of daily weights, provided THN calendar]   THN CM Short Term Goal #1 (0-30 days)  Patient will be able to verbalize daily weights in the next 30 days. [patient is weighing daily]   THN CM Short Term Goal #1 Start Date  12/18/14 [patient reports that she is weighing daily. ]   Interventions for Short Term Goal #1  -- [reviewed heart failure zones]   THN CM Short Term Goal #2 (0-30 days)  Patient will be able to report following low salt diet for 30 days. [patient is following a low salt diet]   THN CM Short Term Goal #2 Start Date  12/18/14   Interventions for Short Term Goal #2  -- [patient is currently following low salt diet. Emmi provided]     Tomasa Rand, RN, BSN, CEN [facility]  [employer] [phone]

## 2015-01-11 DIAGNOSIS — I272 Other secondary pulmonary hypertension: Secondary | ICD-10-CM | POA: Diagnosis not present

## 2015-01-11 DIAGNOSIS — G4733 Obstructive sleep apnea (adult) (pediatric): Secondary | ICD-10-CM | POA: Diagnosis not present

## 2015-01-11 DIAGNOSIS — E785 Hyperlipidemia, unspecified: Secondary | ICD-10-CM | POA: Diagnosis not present

## 2015-01-11 DIAGNOSIS — E1165 Type 2 diabetes mellitus with hyperglycemia: Secondary | ICD-10-CM | POA: Diagnosis not present

## 2015-01-11 DIAGNOSIS — I442 Atrioventricular block, complete: Secondary | ICD-10-CM | POA: Diagnosis not present

## 2015-01-11 DIAGNOSIS — I5031 Acute diastolic (congestive) heart failure: Secondary | ICD-10-CM | POA: Diagnosis not present

## 2015-01-12 DIAGNOSIS — G4733 Obstructive sleep apnea (adult) (pediatric): Secondary | ICD-10-CM | POA: Diagnosis not present

## 2015-01-12 DIAGNOSIS — E1165 Type 2 diabetes mellitus with hyperglycemia: Secondary | ICD-10-CM | POA: Diagnosis not present

## 2015-01-12 DIAGNOSIS — I5031 Acute diastolic (congestive) heart failure: Secondary | ICD-10-CM | POA: Diagnosis not present

## 2015-01-12 DIAGNOSIS — E785 Hyperlipidemia, unspecified: Secondary | ICD-10-CM | POA: Diagnosis not present

## 2015-01-12 DIAGNOSIS — I272 Other secondary pulmonary hypertension: Secondary | ICD-10-CM | POA: Diagnosis not present

## 2015-01-12 DIAGNOSIS — I442 Atrioventricular block, complete: Secondary | ICD-10-CM | POA: Diagnosis not present

## 2015-01-15 DIAGNOSIS — I5031 Acute diastolic (congestive) heart failure: Secondary | ICD-10-CM | POA: Diagnosis not present

## 2015-01-15 DIAGNOSIS — I442 Atrioventricular block, complete: Secondary | ICD-10-CM | POA: Diagnosis not present

## 2015-01-15 DIAGNOSIS — I272 Other secondary pulmonary hypertension: Secondary | ICD-10-CM | POA: Diagnosis not present

## 2015-01-15 DIAGNOSIS — E785 Hyperlipidemia, unspecified: Secondary | ICD-10-CM | POA: Diagnosis not present

## 2015-01-15 DIAGNOSIS — G4733 Obstructive sleep apnea (adult) (pediatric): Secondary | ICD-10-CM | POA: Diagnosis not present

## 2015-01-15 DIAGNOSIS — E1165 Type 2 diabetes mellitus with hyperglycemia: Secondary | ICD-10-CM | POA: Diagnosis not present

## 2015-01-16 ENCOUNTER — Institutional Professional Consult (permissible substitution): Payer: Medicare Other | Admitting: Internal Medicine

## 2015-01-16 ENCOUNTER — Encounter: Payer: Medicare Other | Admitting: Cardiovascular Disease

## 2015-01-17 DIAGNOSIS — I5031 Acute diastolic (congestive) heart failure: Secondary | ICD-10-CM | POA: Diagnosis not present

## 2015-01-17 DIAGNOSIS — I442 Atrioventricular block, complete: Secondary | ICD-10-CM | POA: Diagnosis not present

## 2015-01-17 DIAGNOSIS — E1165 Type 2 diabetes mellitus with hyperglycemia: Secondary | ICD-10-CM | POA: Diagnosis not present

## 2015-01-17 DIAGNOSIS — E785 Hyperlipidemia, unspecified: Secondary | ICD-10-CM | POA: Diagnosis not present

## 2015-01-17 DIAGNOSIS — I272 Other secondary pulmonary hypertension: Secondary | ICD-10-CM | POA: Diagnosis not present

## 2015-01-17 DIAGNOSIS — G4733 Obstructive sleep apnea (adult) (pediatric): Secondary | ICD-10-CM | POA: Diagnosis not present

## 2015-01-18 ENCOUNTER — Encounter: Payer: Self-pay | Admitting: Internal Medicine

## 2015-01-18 ENCOUNTER — Ambulatory Visit (INDEPENDENT_AMBULATORY_CARE_PROVIDER_SITE_OTHER): Payer: Medicare Other | Admitting: Internal Medicine

## 2015-01-18 ENCOUNTER — Telehealth: Payer: Self-pay | Admitting: Internal Medicine

## 2015-01-18 ENCOUNTER — Other Ambulatory Visit: Payer: Medicare Other

## 2015-01-18 ENCOUNTER — Other Ambulatory Visit: Payer: Self-pay

## 2015-01-18 VITALS — BP 132/60 | HR 69 | Ht 67.0 in | Wt 202.0 lb

## 2015-01-18 DIAGNOSIS — J449 Chronic obstructive pulmonary disease, unspecified: Secondary | ICD-10-CM

## 2015-01-18 DIAGNOSIS — E780 Pure hypercholesterolemia: Secondary | ICD-10-CM | POA: Diagnosis not present

## 2015-01-18 DIAGNOSIS — I270 Primary pulmonary hypertension: Secondary | ICD-10-CM

## 2015-01-18 DIAGNOSIS — R0602 Shortness of breath: Secondary | ICD-10-CM

## 2015-01-18 DIAGNOSIS — H269 Unspecified cataract: Secondary | ICD-10-CM | POA: Diagnosis not present

## 2015-01-18 DIAGNOSIS — I272 Other secondary pulmonary hypertension: Secondary | ICD-10-CM | POA: Diagnosis not present

## 2015-01-18 DIAGNOSIS — E1165 Type 2 diabetes mellitus with hyperglycemia: Secondary | ICD-10-CM | POA: Diagnosis not present

## 2015-01-18 DIAGNOSIS — F418 Other specified anxiety disorders: Secondary | ICD-10-CM | POA: Diagnosis not present

## 2015-01-18 DIAGNOSIS — I10 Essential (primary) hypertension: Secondary | ICD-10-CM | POA: Diagnosis not present

## 2015-01-18 DIAGNOSIS — I700 Atherosclerosis of aorta: Secondary | ICD-10-CM | POA: Diagnosis not present

## 2015-01-18 DIAGNOSIS — M4806 Spinal stenosis, lumbar region: Secondary | ICD-10-CM | POA: Diagnosis not present

## 2015-01-18 DIAGNOSIS — E119 Type 2 diabetes mellitus without complications: Secondary | ICD-10-CM | POA: Diagnosis not present

## 2015-01-18 DIAGNOSIS — I110 Hypertensive heart disease with heart failure: Secondary | ICD-10-CM | POA: Diagnosis not present

## 2015-01-18 MED ORDER — VALSARTAN 80 MG PO TABS: 80.0000 mg | ORAL_TABLET | Freq: Every day | ORAL | Status: DC

## 2015-01-18 NOTE — Assessment & Plan Note (Signed)
ACE inhibitors are problematic in  pts with airway complaints because  even experienced pulmonologists can't always distinguish ace effects from copd/asthma.  By themselves they don't actually cause a problem, much like oxygen can't by itself start a fire, but they certainly serve as a powerful catalyst or enhancer for any "fire"  or inflammatory process in the upper airway, be it caused by an ET  tube or more commonly reflux (especially in the obese or pts with known GERD or who are on biphoshonates).    In the era of ARB near equivalency until we have a better handle on the reversibility of the airway problem, it just makes sense to avoid ACEI  entirely in the short run and then decide later, having established a level of airway control using a reasonable limited regimen, whether to add back ace but even then being very careful to observe the pt for worsening airway control and number of meds used/ needed to control symptoms.    Try diovan 80 mg daily for now and regroup in 6 weeks

## 2015-01-18 NOTE — Patient Instructions (Addendum)
Only use the 02 at bedtime and keep a portable system handy for as needed use but at this point your walking is not limited by lack of 02   Stop lisinopril and your breathing should improve back to where it was previously   Start valsartan 80 mg daily in place of lisinopril  Please schedule a follow up office visit in 6 weeks, call sooner if needed and we'll re-evaluate your needs then

## 2015-01-18 NOTE — Patient Outreach (Signed)
Placed call to patient. No answer. Will attempt again. Noted patient saw pulmonary today.  Tomasa Rand, RN, BSN, CEN [facility] [employer] [phone]

## 2015-01-18 NOTE — Assessment & Plan Note (Addendum)
pfts 12/13/14  FEV1  1.06 (53%) ratio 57  And dlco 30 corrects to 77%    When respiratory symptoms begin or become refractory well after a patient reports complete smoking cessation,  Especially when this wasn't the case while they were smoking, a red flag is raised based on the work of Dr Kris Mouton which states:  if you quit smoking when your best day FEV1 is still well preserved it is highly unlikely you will progress to severe disease.  That is to say, once the smoking stops,  the symptoms should not suddenly erupt or markedly worsen.  If so, the differential diagnosis should include  obesity/deconditioning,  LPR/Reflux/Aspiration syndromes,  occult CHF, or  especially side effect of medications commonly used in this population.     Acei and beta blockers at top of list > try off acei first then regroup and consider trial of lama if limited by sob  Discussed with son and pt:  although there may be copd present, it may not be clinically relevant:   it does not appear to be limiting activity tolerance any more than a set of worn tires limits someone from driving a car  around a parking lot.  A new set of Michelins might look good but would have no perceived impact on the performance of the car and would not be worth the cost.  That is to say:   this pt is so sedentary I don't recommend aggressive pulmonary rx at this point unless limiting symptoms arise or acute exacerbations become as issue, neither of which is the case now.  I asked the patient to contact this office at any time in the future should either of these problems arise.

## 2015-01-18 NOTE — Assessment & Plan Note (Signed)
>>  ASSESSMENT AND PLAN FOR PULMONARY HYPERTENSION (HCC) WRITTEN ON 01/18/2015  1:11 PM BY Sherene Sires, MICHAEL B, MD  R[facility] 12/14/14  RA 8/5 mean 4 mm Hg RV 38/5/7 mm Hg PA 38/16 mean 24 mm Hg  PCWP 12/10 mean 10 mm Hg LV 144/14 mm Hg AO 138/59 mean 86 mm Hg PVR =2.24 woods units   Oxygen saturations: PA 68% AO 92%  Cardiac Output (Fick) 6.25 L/min  Cardiac Index (Fick) 3.1 L/min/meter squared.    Clearly this is not PAH but related to elevated LH pressures, no need for specific rx

## 2015-01-18 NOTE — Telephone Encounter (Signed)
Per 01/18/15 OV w/ MW: Patient Instructions     Only use the 02 at bedtime and keep a portable system handy for as needed use but at this point your walking is not limited by lack of 02  Stop lisinopril and your breathing should improve back to where it was previously  Start valsartan 80 mg daily in place of lisinopril Please schedule a follow up office visit in 6 weeks, call sooner if needed and we'll re-evaluate your needs then   ----  Called spoke with pt son Ron. He wants [place] to order pt a POC called simply go. I advised him pt did not qualify for this in office but he still wants an order sent for this for pt to have when she goes on vacations. Please advise MW if okay? thanks

## 2015-01-18 NOTE — Assessment & Plan Note (Signed)
>>  ASSESSMENT AND PLAN FOR ESSENTIAL HYPERTENSION WRITTEN ON 01/18/2015 11:38 AM BY WERT, MICHAEL B, MD  ACE inhibitors are problematic in  pts with airway complaints because  even experienced pulmonologists can't always distinguish ace effects from copd/asthma.  By themselves they don't actually cause a problem, much like oxygen can't by itself start a fire, but they certainly serve as a powerful catalyst or enhancer for any "fire"  or inflammatory process in the upper airway, be it caused by an ET  tube or more commonly reflux (especially in the obese or pts with known GERD or who are on biphoshonates).    In the era of ARB near equivalency until we have a better handle on the reversibility of the airway problem, it just makes sense to avoid ACEI  entirely in the short run and then decide later, having established a level of airway control using a reasonable limited regimen, whether to add back ace but even then being very careful to observe the pt for worsening airway control and number of meds used/ needed to control symptoms.    Try diovan 80 mg daily for now and regroup in 6 weeks

## 2015-01-18 NOTE — Assessment & Plan Note (Signed)
Trial off acei 01/18/2015 >>> - 01/18/2015   Walked RA x one lap @ 185 stopped due to  Sob but no desat, slow to nl  Pace  Symptoms are markedly disproportionate to objective findings and not clear this is a lung problem but pt does appear to have difficult airway management issues. DDX of  difficult airways management all start with A and  include Adherence, Ace Inhibitors, Acid Reflux, Active Sinus Disease, Alpha 1 Antitripsin deficiency, Anxiety masquerading as Airways dz,  ABPA,  allergy(esp in young), Aspiration (esp in elderly), Adverse effects of DPI,  Active smokers, plus two Bs  = Bronchiectasis and Beta blocker use..and one C= CHF   Adherence is always the initial "prime suspect" and is a multilayered concern that requires a "trust but verify" approach in every patient - starting with knowing how to use medications, especially inhalers, correctly, keeping up with refills and understanding the fundamental difference between maintenance and prns vs those medications only taken for a very short course and then stopped and not refilled.   ACEi effect > only way to know is try off

## 2015-01-18 NOTE — Assessment & Plan Note (Signed)
RHC 12/14/14  RA 8/5 mean 4 mm Hg RV 38/5/7 mm Hg PA 38/16 mean 24 mm Hg  PCWP 12/10 mean 10 mm Hg LV 144/14 mm Hg AO 138/59 mean 86 mm Hg PVR =2.24 woods units   Oxygen saturations: PA 68% AO 92%  Cardiac Output (Fick) 6.25 L/min  Cardiac Index (Fick) 3.1 L/min/meter squared.    Clearly this is not PAH but related to elevated LH pressures, no need for specific rx

## 2015-01-18 NOTE — Progress Notes (Signed)
Subjective:    [name], female    DOB: 1940/12/11,    MRN: [id number]  HPI  63 yowf quit smoking around 1990 with documented GOLD II copd 12/2014  with no resp problems until spring of 2016 > admit cc progressively SOB x two weeks with orthopnea, PND and swelling in her abdomen. Some coughing which is worse when lying down s sign peripheral edema:  Admit date: 12/06/2014 Discharge date: 12/15/2014  PCP: Simona Huh, MD Primary Cardiologist: Dr. Sallyanne Kuster  Primary Discharge Diagnosis: Accelerated hypertension with diastolic congestive heart failure, NYHA class 3 - weight 206 pounds at discharge  Secondary Discharge Diagnosis:   CHB (complete heart block)   Pacemaker - dual chamber St Jude, 2009  Essential hypertension  Normal coronary arteries angiography 2002  Hyperlipidemia  Acute congestive heart failure  Congestive heart disease  Dyspnea  SOB (shortness of breath)  Sleep apnea  Hypoxemia  Acute respiratory failure with hypoxemia  Acute pulmonary edema  Pulmonary hypertension    01/18/2015 1st [place] [facility] office visit/ Wert / referred by Cards/ Miguel Rota re copd/02 desp resp failure   Chief Complaint  Patient presents with  . Pulmonary Consult    Referred by Miguel Rota, PA for eval of Hypoxia. Pt c/o SOB "all the time" for the past 3 wks.  She has been clearing her throat alot for the past couple wks.    new doe x 3 steps but not wearing 02 / steps were  not a problem before her admit (confirmed only sob x 2 weeks pre-admit)  Also new sob talking and lump in throat / dysyphagia since discharge > that was not present prior to d/c on ACEi new start assoc with dry daytime cough/ urge to clear throat Now lying flat at hs ok on 02 comfortably  but confused as to how to use 02 sitting/walking  No obvious other patterns in day to day or daytime variabilty or assoc   cp or chest tightness, subjective wheeze overt sinus  or hb symptoms. No unusual exp hx or h/o childhood pna/ asthma or knowledge of premature birth.  Sleeping ok without nocturnal  or early am exacerbation  of respiratory  c/o's or need for noct saba. Also denies any obvious fluctuation of symptoms with weather or environmental changes or other aggravating or alleviating factors except as outlined above   Current Medications, Allergies, Complete Past Medical History, Past Surgical History, Family History, and Social History were reviewed in [employer] record.               Review of Systems  Constitutional: Negative for fever, chills and unexpected weight change.  HENT: Negative for congestion, dental problem, ear pain, nosebleeds, postnasal drip, rhinorrhea, sinus pressure, sneezing, sore throat, trouble swallowing and voice change.   Eyes: Negative for visual disturbance.  Respiratory: Positive for cough and shortness of breath. Negative for choking.   Cardiovascular: Negative for chest pain and leg swelling.  Gastrointestinal: Negative for vomiting, abdominal pain and diarrhea.  Genitourinary: Negative for difficulty urinating.  Musculoskeletal: Negative for arthralgias.  Skin: Negative for rash.  Neurological: Negative for tremors, syncope and headaches.  Hematological: Does not bruise/bleed easily.       Objective:   Physical Exam   amb wf nad very hoarse / occ throat clearing   Wt Readings from Last 3 Encounters:  01/18/15 202 lb (91.627 kg)  01/10/15 197 lb (89.359 kg)  01/09/15 203 lb 1.6 oz (92.126  kg)    Vital signs reviewed   HEENT: full dentures , nl turbinates, and orophanx. Nl external ear canals without cough reflex   NECK :  without JVD/Nodes/TM/ nl carotid upstrokes bilaterally   LUNGS: no acc muscle use, clear to A and P bilaterally without cough on insp or exp maneuvers   CV:  RRR  no s3 or murmur or increase in P2, no edema   ABD:  soft and nontender with nl excursion in  the supine position. No bruits or organomegaly, bowel sounds nl  MS:  warm without deformities, calf tenderness, cyanosis or clubbing  SKIN: warm and dry without lesions    NEURO:  alert, approp, no deficits     I personally reviewed images and agree with radiology impression as follows:  CXR:  12/15/14  No edema or consolidation.   CTa  12/11/14 Negative for pulmonary embolus. Mild cardiomegaly. Calcific aortic and coronary atherosclerosis. Findings compatible with pulmonary arterial hypertension.  Labs reviewed:     Chemistry      Component Value Date/Time   NA 134* 12/29/2014 1128   K 4.6 12/29/2014 1128   CL 98 12/29/2014 1128   CO2 31 12/29/2014 1128   BUN 24* 12/29/2014 1128   CREATININE 0.67 12/29/2014 1128      Component Value Date/Time   CALCIUM 9.8 12/29/2014 1128       Lab Results  Component Value Date   TSH 2.775   11/29/2007     Lab Results  Component Value Date   PROBNP 1068.0* 11/29/2007    Lab Results  Component Value Date   WBC 6.8 12/15/2014   HGB 12.4 12/15/2014   HCT 38.9 12/15/2014   MCV 92.6 12/15/2014   PLT 195 12/15/2014          Assessment & Plan:

## 2015-01-19 DIAGNOSIS — G4733 Obstructive sleep apnea (adult) (pediatric): Secondary | ICD-10-CM | POA: Diagnosis not present

## 2015-01-19 DIAGNOSIS — E785 Hyperlipidemia, unspecified: Secondary | ICD-10-CM | POA: Diagnosis not present

## 2015-01-19 DIAGNOSIS — I5031 Acute diastolic (congestive) heart failure: Secondary | ICD-10-CM | POA: Diagnosis not present

## 2015-01-19 DIAGNOSIS — I272 Other secondary pulmonary hypertension: Secondary | ICD-10-CM | POA: Diagnosis not present

## 2015-01-19 DIAGNOSIS — I442 Atrioventricular block, complete: Secondary | ICD-10-CM | POA: Diagnosis not present

## 2015-01-19 DIAGNOSIS — E1165 Type 2 diabetes mellitus with hyperglycemia: Secondary | ICD-10-CM | POA: Diagnosis not present

## 2015-01-19 NOTE — Telephone Encounter (Signed)
Her concern is having a system Production manager) to use when she travels which advanced should be able to provide  That is different from portable 02 which she did not qualify for in our office but fine with me to put in order for it and warn them it probably won't be paid for if same result as we got in office (would discuss with libby)  Order should be for "evaluation for POC /titrate for ambulation"

## 2015-01-19 NOTE — Telephone Encounter (Signed)
Spoke with Ron and notified of recs per MW  He verbalized understanding  He understands ins may not cover, but still wants [place] to order  I have placed order to [facility]  Nothing further needed

## 2015-01-22 ENCOUNTER — Other Ambulatory Visit: Payer: Self-pay

## 2015-01-22 DIAGNOSIS — E1165 Type 2 diabetes mellitus with hyperglycemia: Secondary | ICD-10-CM | POA: Diagnosis not present

## 2015-01-22 DIAGNOSIS — I5031 Acute diastolic (congestive) heart failure: Secondary | ICD-10-CM | POA: Diagnosis not present

## 2015-01-22 DIAGNOSIS — I272 Other secondary pulmonary hypertension: Secondary | ICD-10-CM | POA: Diagnosis not present

## 2015-01-22 DIAGNOSIS — I442 Atrioventricular block, complete: Secondary | ICD-10-CM | POA: Diagnosis not present

## 2015-01-22 DIAGNOSIS — G4733 Obstructive sleep apnea (adult) (pediatric): Secondary | ICD-10-CM | POA: Diagnosis not present

## 2015-01-22 DIAGNOSIS — E785 Hyperlipidemia, unspecified: Secondary | ICD-10-CM | POA: Diagnosis not present

## 2015-01-22 NOTE — Patient Outreach (Signed)
Case closure:  Final transition of care call.  Patient reports that she is going well. Reports that she was able to walk down the driveway and back without her oxygen with therapist today.  Reports that she is using her oxygen as needed during the day. Reports using her oxygen at night all the time.   Denies any weight gain. No shortness of breath or swelling.   Voice sounds much stronger.  Talking in complete sentences without being short of breath.   Discussed with patient that she has met her goals. ( no readmission and is able to manage her Heart failure daily without problems)Patient feels confident that she knows how to manage her heart failure and does not feel like she needs any more home visit. Patient feels like she is ready to fly on her own.  After long discussion with patient she feels comfortable with case closure at this time.    Plan:Will close case. Will send MD notification. Will mail patient case closure letter.  Reminded patient to call if she needs assistance in the future.THN will reopen case as needed.  Weight 196 lb (88.905 kg).   [facility] CM Care Plan Problem One        Patient Outreach Telephone from 01/22/2015 in [place] Problem One  Patient with recent admission related to heart failure.   Care Plan for Problem One  Active   [facility] Long Term Goal (31-90 days)  Patient will be able to report no readmission in the next 31 days.   [facility] Long Term Goal Start Date  12/18/14   [facility] Long Term Goal Met Date  01/22/15 [successul at reaching goal.no readmissions]   Interventions for Problem One Long Term Goal  Explained importance of timely follow up with MD.  Explained importance of daily heart failure management like weights and low salt diet. [Discussed importance of daily weights, provided THN calendar]   [facility] CM Short Term Goal #1 (0-30 days)  Patient will be able to verbalize daily weights in the next 30 days. [patient is weighing daily]   [facility] CM Short  Term Goal #1 Start Date  12/18/14 [patient reports that she is weighing daily. ]   [facility] CM Short Term Goal #1 Met Date  01/22/15 [no readmissions]   Interventions for Short Term Goal #1  -- [reviewed heart failure zones]   THN CM Short Term Goal #2 (0-30 days)  Patient will be able to report following low salt diet for 30 days. [patient is following a low salt diet]   [facility]N CM Short Term Goal #2 Start Date  12/18/14   [facility] CM Short Term Goal #2 Met Date  01/22/15 [goal met. following a low salt diet]   Interventions for Short Term Goal #2  -- [patient is currently following low salt diet. Emmi provided]      Tomasa Rand, RN, BSN, CEN [facility] [employer] [phone]

## 2015-01-24 DIAGNOSIS — I272 Other secondary pulmonary hypertension: Secondary | ICD-10-CM | POA: Diagnosis not present

## 2015-01-24 DIAGNOSIS — E1165 Type 2 diabetes mellitus with hyperglycemia: Secondary | ICD-10-CM | POA: Diagnosis not present

## 2015-01-24 DIAGNOSIS — E785 Hyperlipidemia, unspecified: Secondary | ICD-10-CM | POA: Diagnosis not present

## 2015-01-24 DIAGNOSIS — I442 Atrioventricular block, complete: Secondary | ICD-10-CM | POA: Diagnosis not present

## 2015-01-24 DIAGNOSIS — G4733 Obstructive sleep apnea (adult) (pediatric): Secondary | ICD-10-CM | POA: Diagnosis not present

## 2015-01-24 DIAGNOSIS — I5031 Acute diastolic (congestive) heart failure: Secondary | ICD-10-CM | POA: Diagnosis not present

## 2015-01-25 NOTE — Patient Outreach (Signed)
[facility]) Care Management  01/25/2015  [name] 1940/12/25 [id number]  Notification from Tomasa Rand, RN to close case due to goals met.  [name]. [place], [place] Management [place] Assistant Phone: [phone] Fax: [phone]

## 2015-01-30 DIAGNOSIS — E1165 Type 2 diabetes mellitus with hyperglycemia: Secondary | ICD-10-CM | POA: Diagnosis not present

## 2015-01-30 DIAGNOSIS — I272 Other secondary pulmonary hypertension: Secondary | ICD-10-CM | POA: Diagnosis not present

## 2015-01-30 DIAGNOSIS — I5031 Acute diastolic (congestive) heart failure: Secondary | ICD-10-CM | POA: Diagnosis not present

## 2015-01-30 DIAGNOSIS — I442 Atrioventricular block, complete: Secondary | ICD-10-CM | POA: Diagnosis not present

## 2015-01-30 DIAGNOSIS — G4733 Obstructive sleep apnea (adult) (pediatric): Secondary | ICD-10-CM | POA: Diagnosis not present

## 2015-01-30 DIAGNOSIS — E785 Hyperlipidemia, unspecified: Secondary | ICD-10-CM | POA: Diagnosis not present

## 2015-02-05 DIAGNOSIS — E1165 Type 2 diabetes mellitus with hyperglycemia: Secondary | ICD-10-CM | POA: Diagnosis not present

## 2015-02-05 DIAGNOSIS — I272 Other secondary pulmonary hypertension: Secondary | ICD-10-CM | POA: Diagnosis not present

## 2015-02-05 DIAGNOSIS — G4733 Obstructive sleep apnea (adult) (pediatric): Secondary | ICD-10-CM | POA: Diagnosis not present

## 2015-02-05 DIAGNOSIS — I442 Atrioventricular block, complete: Secondary | ICD-10-CM | POA: Diagnosis not present

## 2015-02-05 DIAGNOSIS — E785 Hyperlipidemia, unspecified: Secondary | ICD-10-CM | POA: Diagnosis not present

## 2015-02-05 DIAGNOSIS — I5031 Acute diastolic (congestive) heart failure: Secondary | ICD-10-CM | POA: Diagnosis not present

## 2015-02-07 ENCOUNTER — Encounter: Payer: Self-pay | Admitting: Cardiovascular Disease

## 2015-02-07 ENCOUNTER — Telehealth: Payer: Self-pay | Admitting: *Deleted

## 2015-02-07 NOTE — Telephone Encounter (Signed)
Home O2 f/u orders faxed to [facility].

## 2015-02-19 DIAGNOSIS — M5416 Radiculopathy, lumbar region: Secondary | ICD-10-CM | POA: Diagnosis not present

## 2015-02-19 DIAGNOSIS — M5136 Other intervertebral disc degeneration, lumbar region: Secondary | ICD-10-CM | POA: Diagnosis not present

## 2015-02-21 DIAGNOSIS — H2589 Other age-related cataract: Secondary | ICD-10-CM | POA: Diagnosis not present

## 2015-02-21 DIAGNOSIS — H2512 Age-related nuclear cataract, left eye: Secondary | ICD-10-CM | POA: Diagnosis not present

## 2015-02-21 DIAGNOSIS — H40012 Open angle with borderline findings, low risk, left eye: Secondary | ICD-10-CM | POA: Diagnosis not present

## 2015-02-21 DIAGNOSIS — H5703 Miosis: Secondary | ICD-10-CM | POA: Diagnosis not present

## 2015-02-21 DIAGNOSIS — H2511 Age-related nuclear cataract, right eye: Secondary | ICD-10-CM | POA: Diagnosis not present

## 2015-02-23 ENCOUNTER — Other Ambulatory Visit: Payer: Self-pay

## 2015-02-23 DIAGNOSIS — M5416 Radiculopathy, lumbar region: Secondary | ICD-10-CM | POA: Diagnosis not present

## 2015-02-23 DIAGNOSIS — Z1231 Encounter for screening mammogram for malignant neoplasm of breast: Secondary | ICD-10-CM

## 2015-02-28 ENCOUNTER — Ambulatory Visit
Admission: RE | Admit: 2015-02-28 | Discharge: 2015-02-28 | Disposition: A | Discharge status: Home / Self Care | Payer: Medicare Other | Source: Ambulatory Visit

## 2015-02-28 DIAGNOSIS — Z1231 Encounter for screening mammogram for malignant neoplasm of breast: Secondary | ICD-10-CM

## 2015-03-01 ENCOUNTER — Ambulatory Visit (INDEPENDENT_AMBULATORY_CARE_PROVIDER_SITE_OTHER): Payer: Medicare Other | Admitting: Internal Medicine

## 2015-03-01 ENCOUNTER — Encounter (INDEPENDENT_AMBULATORY_CARE_PROVIDER_SITE_OTHER): Payer: Self-pay

## 2015-03-01 ENCOUNTER — Encounter: Payer: Self-pay | Admitting: Internal Medicine

## 2015-03-01 VITALS — BP 110/74 | HR 69 | Ht 67.0 in | Wt 197.8 lb

## 2015-03-01 DIAGNOSIS — R058 Other specified cough: Secondary | ICD-10-CM

## 2015-03-01 DIAGNOSIS — J449 Chronic obstructive pulmonary disease, unspecified: Secondary | ICD-10-CM | POA: Diagnosis not present

## 2015-03-01 DIAGNOSIS — R05 Cough: Secondary | ICD-10-CM | POA: Diagnosis not present

## 2015-03-01 DIAGNOSIS — R0602 Shortness of breath: Secondary | ICD-10-CM | POA: Diagnosis not present

## 2015-03-01 DIAGNOSIS — I10 Essential (primary) hypertension: Secondary | ICD-10-CM

## 2015-03-01 NOTE — Patient Instructions (Addendum)
Try prilosec 20mg   Take 30-60 min before first meal of the day and Pepcid (famotidine) 20 mg one at bedtime until frog in throat  is completely gone for at least a week   GERD (REFLUX)  is an extremely common cause of respiratory symptoms just like yours , many times with no obvious heartburn at all.    It can be treated with medication, but also with lifestyle changes including elevation of the head of your bed (ideally with 6 inch  bed blocks),  Smoking cessation, avoidance of late meals, excessive alcohol, and avoid fatty foods, chocolate, peppermint, colas, red wine, and acidic juices such as orange juice.  NO MINT OR MENTHOL PRODUCTS SO NO COUGH DROPS  USE SUGARLESS CANDY INSTEAD (Jolley ranchers or Stover's or Life Savers) or even ice chips will also do - the key is to swallow to prevent all throat clearing. NO OIL BASED VITAMINS - use powdered substitutes.   If breathing getting worse or cough worse return right away.

## 2015-03-01 NOTE — Progress Notes (Signed)
Subjective:    Patient ID: [name], female    DOB: 04-Nov-1940,    MRN: [id number]    Brief patient profile:  73 yowf quit smoking around 1990 with documented GOLD II copd 12/2014  with no resp problems until spring of 2016 > admit cc progressively SOB x two weeks with orthopnea, PND and swelling in her abdomen. Some coughing which is worse when lying down s sign peripheral edema:  Admit date: 12/06/2014 Discharge date: 12/15/2014  PCP: Simona Huh, MD Primary Cardiologist: Dr. Sallyanne Kuster  Primary Discharge Diagnosis: Accelerated hypertension with diastolic congestive heart failure, NYHA class 3 - weight 206 pounds at discharge  Secondary Discharge Diagnosis:   CHB (complete heart block)   Pacemaker - dual chamber St Jude, 2009  Essential hypertension  Normal coronary arteries angiography 2002  Hyperlipidemia  Acute congestive heart failure  Congestive heart disease  Dyspnea  SOB (shortness of breath)  Sleep apnea  Hypoxemia  Acute respiratory failure with hypoxemia  Acute pulmonary edema  Pulmonary hypertension    01/18/2015 1st [place] [facility] office visit/ Shauntee Karp / referred by Cards/ Miguel Rota re copd/02 desp resp failure   Chief Complaint  Patient presents with  . Pulmonary Consult    Referred by Miguel Rota, PA for eval of Hypoxia. Pt c/o SOB "all the time" for the past 3 wks.  She has been clearing her throat alot for the past couple wks.   new doe x 3 steps but not wearing 02 / steps were  not a problem before her admit (confirmed only sob x 2 weeks pre-admit)  Also new sob talking and lump in throat / dysyphagia since discharge > that was not present prior to d/c on ACEi new start assoc with dry daytime cough/ urge to clear throat Now lying flat at hs ok on 02 comfortably  but confused as to how to use 02 sitting/walking rec Only use the 02 at bedtime and keep a portable system handy for as needed use but at this point your  walking is not limited by lack of 02  Stop lisinopril and your breathing should improve back to where it was previously  Start valsartan 80 mg daily in place of lisinopril Please schedule a follow up office visit in 6 weeks, call sooner if needed and we'll re-evaluate your needs then    03/01/2015 f/u ov/Vona Whiters re:  Noct 02 dep/GOLD II  copd/ uacs ? How much acei related off x 6 weeks  Chief Complaint  Patient presents with  . Follow-up    Pt states that her breathing has improved some. She states she has only had to use her o2 a few times during the day. No new co's today.   walking at home is better but not like it was due to limiting sob  But also back and knees limit her  Was able to do some house work but can't do sweeper s doe/ sats ok  Sleeping on 02 2lpm flat Taking gabapentin 300 x 2  = 6 per day  / still frog in throat daytime but not as bad as while on ACEi   No obvious day to day or daytime variabilty or assoc excess/ purulent mucus production or cp or chest tightness, subjective wheeze overt sinus or hb symptoms. No unusual exp hx or h/o childhood pna/ asthma or knowledge of premature birth.  Sleeping ok without nocturnal  or early am exacerbation  of respiratory  c/o's or need for noct saba.  Also denies any obvious fluctuation of symptoms with weather or environmental changes or other aggravating or alleviating factors except as outlined above   Current Medications, Allergies, Complete Past Medical History, Past Surgical History, Family History, and Social History were reviewed in [employer] record.  ROS  The following are not active complaints unless bolded sore throat, dysphagia, dental problems, itching, sneezing,  nasal congestion or excess/ purulent secretions, ear ache,   fever, chills, sweats, unintended wt loss, pleuritic or exertional cp, hemoptysis,  orthopnea pnd or leg swelling, presyncope, palpitations, abdominal pain, anorexia, nausea, vomiting,  diarrhea  or change in bowel or urinary habits, change in stools or urine, dysuria,hematuria,  rash, arthralgias, visual complaints, headache, numbness weakness or ataxia or problems with walking or coordination,  change in mood/affect or memory.           Objective:   Physical Exam   amb wf nad   03/01/2015        198  Wt Readings from Last 3 Encounters:  01/18/15 202 lb (91.627 kg)  01/10/15 197 lb (89.359 kg)  01/09/15 203 lb 1.6 oz (92.126 kg)    Vital signs reviewed   HEENT: full dentures , nl turbinates, and orophanx. Nl external ear canals without cough reflex   NECK :  without JVD/Nodes/TM/ nl carotid upstrokes bilaterally   LUNGS: no acc muscle use, clear to A and P bilaterally without cough on insp or exp maneuvers   CV:  RRR  no s3 or murmur or increase in P2, no edema   ABD:  soft and nontender with nl excursion in the supine position. No bruits or organomegaly, bowel sounds nl  MS:  warm without deformities, calf tenderness, cyanosis or clubbing  SKIN: warm and dry without lesions    NEURO:  alert, approp, no deficits     I personally reviewed images and agree with radiology impression as follows:  CXR:  12/15/14  No edema or consolidation.   CTa  12/11/14 Negative for pulmonary embolus. Mild cardiomegaly. Calcific aortic and coronary atherosclerosis. Findings compatible with pulmonary arterial hypertension.  Labs reviewed:     Chemistry      Component Value Date/Time   NA 134* 12/29/2014 1128   K 4.6 12/29/2014 1128   CL 98 12/29/2014 1128   CO2 31 12/29/2014 1128   BUN 24* 12/29/2014 1128   CREATININE 0.67 12/29/2014 1128      Component Value Date/Time   CALCIUM 9.8 12/29/2014 1128       Lab Results  Component Value Date   TSH 2.775   11/29/2007     Lab Results  Component Value Date   PROBNP 1068.0* 11/29/2007    Lab Results  Component Value Date   WBC 6.8 12/15/2014   HGB 12.4 12/15/2014   HCT 38.9 12/15/2014   MCV 92.6  12/15/2014   PLT 195 12/15/2014          Assessment & Plan:

## 2015-03-04 ENCOUNTER — Encounter: Payer: Self-pay | Admitting: Internal Medicine

## 2015-03-04 DIAGNOSIS — R05 Cough: Secondary | ICD-10-CM | POA: Insufficient documentation

## 2015-03-04 DIAGNOSIS — R058 Other specified cough: Secondary | ICD-10-CM | POA: Insufficient documentation

## 2015-03-04 NOTE — Assessment & Plan Note (Addendum)
Pfts 12/13/14  FEV1  1.06 (53%) ratio 57  And dlco 30 corrects to 77%   She is not as improved as I had hoped off acei and certainly reasonable to give trial of LAMA with the caveat we not use dry powder inhalers due to UACS so we will see her back here prn to teach her how to use respimat spiriva though cautioned here the cost can be quite high relative to the benefit and there are no generics in this market so best approach is free trial for at least a 2 week period  I had an extended discussion with the patient reviewing all relevant studies completed to date and  lasting 15 to 20 minutes of a 25 minute visit    Each maintenance medication was reviewed in detail including most importantly the difference between maintenance and prns and under what circumstances the prns are to be triggered using an action plan format that is not reflected in the computer generated alphabetically organized AVS.    Please see instructions for details which were reviewed in writing and the patient given a copy highlighting the part that I personally wrote and discussed at today's ov.

## 2015-03-04 NOTE — Assessment & Plan Note (Signed)
D/c acei 01/18/2015 due to throat irritation/ sob   Adequate control on present rx, reviewed > no change in rx needed  > would avoid acei rechallenge for at least 3 months to see if get any response to max rx for UACs and if not certain that she did then ok to resume acei but even then being very careful to observe the pt for worsening airway control and number of meds used/ needed to control symptoms/ be they felt to be specific to copd or otherwise eg 'allergy meds/ otc's etc.

## 2015-03-04 NOTE — Assessment & Plan Note (Signed)
Classic Upper airway cough syndrome, so named because it's frequently impossible to sort out how much is  CR/sinusitis with freq throat clearing (which can be related to primary GERD)   vs  causing  secondary (" extra esophageal")  GERD from wide swings in gastric pressure that occur with throat clearing, often  promoting self use of mint and menthol lozenges that reduce the lower esophageal sphincter tone and exacerbate the problem further in a cyclical fashion.   These are the same pts (now being labeled as having "irritable larynx syndrome" by some cough centers) who not infrequently have a history of having failed to tolerate ace inhibitors,  dry powder inhalers (so need to chose inhalers wisely) or biphosphonates or report having atypical reflux symptoms that don't respond to standard doses of PPI , and are easily confused as having aecopd or asthma flares by even experienced allergists/ pulmonologists.  Will try gerd rx and then regroup if still having lump in throat > ? ent next

## 2015-03-04 NOTE — Assessment & Plan Note (Signed)
>>  ASSESSMENT AND PLAN FOR ESSENTIAL HYPERTENSION WRITTEN ON 03/04/2015  5:48 PM BY Sherene SiresWERT, MICHAEL B, MD  D/c acei 01/18/2015 due to throat irritation/ sob   Adequate control on present rx, reviewed > no change in rx needed  > would avoid acei rechallenge for at least 3 months to see if get any response to max rx for UACs and if not certain that she did then ok to resume acei but even then being very careful to observe the pt for worsening airway control and number of meds used/ needed to control symptoms/ be they felt to be specific to copd or otherwise eg 'allergy meds/ otc's etc.

## 2015-03-04 NOTE — Assessment & Plan Note (Addendum)
-   Note PCWP 24 at cath 12/14/14  - Trial off acei 01/18/2015 > marginally improved 02/29/16  - 01/18/2015   Walked RA x one lap @ 185 stopped due to  Sob but no desat, slow to nl  Pace - 03/01/2015   Walked RA x one lap @ 185 stopped due to  fatiue > sob/ no desat/ moderate pace    She is not all that dramatically improved but did say she couldn't have gone much further more to fatigue that sob so doubt more aggressive pulmonary rx will help - see copd

## 2015-03-14 ENCOUNTER — Other Ambulatory Visit: Payer: Self-pay | Admitting: *Deleted

## 2015-03-19 DIAGNOSIS — H2512 Age-related nuclear cataract, left eye: Secondary | ICD-10-CM | POA: Diagnosis not present

## 2015-03-20 DIAGNOSIS — M5416 Radiculopathy, lumbar region: Secondary | ICD-10-CM | POA: Diagnosis not present

## 2015-03-20 DIAGNOSIS — M5136 Other intervertebral disc degeneration, lumbar region: Secondary | ICD-10-CM | POA: Diagnosis not present

## 2015-03-20 DIAGNOSIS — M5440 Lumbago with sciatica, unspecified side: Secondary | ICD-10-CM | POA: Diagnosis not present

## 2015-04-03 ENCOUNTER — Ambulatory Visit: Payer: Medicare Other | Admitting: Cardiovascular Disease

## 2015-04-03 ENCOUNTER — Encounter: Payer: Self-pay | Admitting: Cardiovascular Disease

## 2015-04-03 ENCOUNTER — Ambulatory Visit (INDEPENDENT_AMBULATORY_CARE_PROVIDER_SITE_OTHER): Payer: Medicare Other | Admitting: Cardiovascular Disease

## 2015-04-03 VITALS — BP 94/56 | HR 70 | Resp 16 | Ht 67.0 in | Wt 191.1 lb

## 2015-04-03 DIAGNOSIS — I442 Atrioventricular block, complete: Secondary | ICD-10-CM | POA: Diagnosis not present

## 2015-04-03 DIAGNOSIS — I5031 Acute diastolic (congestive) heart failure: Secondary | ICD-10-CM

## 2015-04-03 DIAGNOSIS — I10 Essential (primary) hypertension: Secondary | ICD-10-CM

## 2015-04-03 LAB — CUP PACEART INCLINIC DEVICE CHECK
Battery Impedance: 1900 Ohm
Battery Voltage: 2.79 V
Brady Statistic RA Percent Paced: 75 %
Brady Statistic RV Percent Paced: 99 %
Lead Channel Impedance Value: 477 Ohm
Lead Channel Pacing Threshold Pulse Width: 0.5 ms
Lead Channel Pacing Threshold Pulse Width: 0.5 ms
Lead Channel Setting Pacing Pulse Width: 0.5 ms
Lead Channel Setting Sensing Sensitivity: 4 mV
MDC IDC MSMT LEADCHNL RA PACING THRESHOLD AMPLITUDE: 0.75 V
MDC IDC MSMT LEADCHNL RA SENSING INTR AMPL: 2.2 mV
MDC IDC MSMT LEADCHNL RV IMPEDANCE VALUE: 537 Ohm
MDC IDC MSMT LEADCHNL RV PACING THRESHOLD AMPLITUDE: 0.625 V
MDC IDC SESS DTM: 20160802132353
MDC IDC SET LEADCHNL RA PACING AMPLITUDE: 2 V
Pulse Gen Serial Number: 1904939

## 2015-04-03 NOTE — Patient Instructions (Addendum)
Your physician wants you to follow-up in: 6 Months. You will receive a reminder letter in the mail two months in advance. If you don't receive a letter, please call our office to schedule the follow-up appointment.  Your physician has recommended you make the following change in your medication: STOP Atenolol

## 2015-04-03 NOTE — Progress Notes (Signed)
[name], female   DOB: [birth year], 74 y.o.   MRN: [id number]     Cardiology Office Note   Date:  04/03/2015   ID:  [name], DOB 1940/09/03, MRN [id number]  PCP:  Tammy Huh, MD  Cardiologist:   Tammy Klein, MD   Chief Complaint  Patient presents with  . Follow-up      History of Present Illness: [name] is a 74 y.o. female who presents for complete heart block and pacemaker check, chronic diastolic HF.  She is doing well, without cardiovascular complaints. Her BP is low today. Has occasional fatigue, denies presyncope.  She is pacemaker dependent with complete heart block since 2009. Comprehensive check of her pacemaker in the office today shows normal function and no evidence of significant arrhythmia. Her device is a Radio broadcast assistant that is not capable of remote monitoring.Estimated generator longevity is 3.25-4.5 years. She has 75% A pacing, >99% V pacing. Lead parameters are good.  She has not had any new manifestations since the episode of CHF precipitated by severely elevated BP after her husband's death earlier this year.  Past Medical History  Diagnosis Date  . Hypertension   . Hypercholesterolemia   . Pacemaker   . Dysrhythmia   . Neuropathy of both feet   . Shortness of breath     exertional  . Venous insufficiency (chronic) (peripheral)   . CHB (complete heart block) 09/19/2013  . HTN (hypertension) 09/19/2013  . Hyperlipidemia 09/19/2013  . Paralysis of right hand 2007    "after spinal cord injury/OR"  . CHF (congestive heart failure) dx'd 12/06/2014  . Coronary artery disease     normal coronaries by 09/10/00 cath  . Myocardial infarction dx'd 2009  . Diabetes mellitus     hga1c 7.1 no meds  . Type II diabetes mellitus     "had it years ago; lost weight after neck OR in 2007; glucose went way down; recently dx'd as returned recently" (12/07/2014)  . Arthritis     "hands, spine" (12/07/2014)  . Chronic pain     "since neck  OR in 2007"  . Urinary incontinence     "since 2007's OR"    Past Surgical History  Procedure Laterality Date  . Cardiac pacemaker placement  12/01/2007    St.Jude Zephyr XLDR #[id number], dual chamber  . Anterior cervical decomp/discectomy fusion  2007  . Lumbar laminectomy/decompression microdiscectomy  03/02/2012    Procedure: LUMBAR LAMINECTOMY/DECOMPRESSION MICRODISCECTOMY 2 LEVELS;  Surgeon: Floyce Stakes, MD;  Location: [facility];  Service: Neurosurgery;  Laterality: Bilateral;  Lumbar three, lumbar four, lumbar five Laminectomy/Foraminotomy  . Transthoracic echocardiogram  11/30/2007    normal echo, mean transaortic valve gradient 13mmHg  . Nm myocar perf wall motion  10/24/2008    protocol:Lexiscan, EF63%, normal study, scan negative for ischemia  . Cardiovascular stress test  08/28/2000    Cardiolite perfusion study, EF58%, low risk study,   . Lower ext. dopplers  06/12/2011    no evidence of thrombus, all vessels normal in size, no evidence of insuffiency  . Back surgery    . Laparoscopic cholecystectomy  1995  . Knee arthroscopy Right   . Dilation and curettage of uterus  1960's  . Cardiac catheterization  09/10/2000    normal Coronary arteries, EF60%,   . Left and right heart catheterization with coronary angiogram N/A 12/14/2014    Procedure: LEFT AND RIGHT HEART CATHETERIZATION WITH CORONARY ANGIOGRAM;  Surgeon: Peter M Martinique, MD;  Location: [facility];  Service: Cardiovascular;  Laterality: N/A;     Current Outpatient Prescriptions  Medication Sig Dispense Refill  . acetaminophen (TYLENOL) 500 MG tablet Take 500 mg by mouth 4 (four) times daily -  with meals and at bedtime.    Marland Kitchen aspirin EC 81 MG tablet Take 81 mg by mouth daily.    . furosemide (LASIX) 40 MG tablet Take 1 tablet (40 mg total) by mouth daily. 30 tablet 6  . gabapentin (NEURONTIN) 300 MG capsule Take 300 mg by mouth 2 (two) times daily.    . metFORMIN (GLUCOPHAGE-XR) 500 MG 24 hr tablet Take 500  mg by mouth daily.  3  . Multiple Vitamins-Minerals (MULTIVITAMIN PO) Take by mouth daily.    . naproxen sodium (ANAPROX) 220 MG tablet Take 220 mg by mouth 2 (two) times daily with a meal.    . potassium chloride SA (K-DUR,KLOR-CON) 20 MEQ tablet Take 1 tablet (20 mEq total) by mouth daily. 30 tablet 6  . pravastatin (PRAVACHOL) 40 MG tablet Take 40 mg by mouth daily.    . valsartan (DIOVAN) 80 MG tablet Take 1 tablet (80 mg total) by mouth daily. 30 tablet 11   No current facility-administered medications for this visit.    Allergies:   Review of patient's allergies indicates no known allergies.    Social History:  The patient  reports that she quit smoking about 23 years ago. Her smoking use included Cigarettes. She has a 40 pack-year smoking history. She has never used smokeless tobacco. She reports that she does not drink alcohol or use illicit drugs.   Family History:  The patient's family history includes Diabetes in her father and mother; Heart attack in her father; Heart disease in her father and mother; Hyperlipidemia in her father and mother; Hypertension in her father and mother.  ROS:  Please see the history of present illness.    Otherwise, review of systems positive for none.   All other systems are reviewed and negative.    PHYSICAL EXAM: VS:  BP 94/56 mmHg  Pulse 70  Resp 16  Ht 5\' 7"  (1.702 m)  Wt 191 lb 1.6 oz (86.682 kg)  BMI 29.92 kg/m2 , BMI Body mass index is 29.92 kg/(m^2).  General: Alert, oriented x3, no distress Head: no evidence of trauma, PERRL, EOMI, no exophtalmos or lid lag, no myxedema, no xanthelasma; normal ears, nose and oropharynx Neck: normal jugular venous pulsations and no hepatojugular reflux; brisk carotid pulses without delay and no carotid bruits Chest: clear to auscultation, no signs of consolidation by percussion or palpation, normal fremitus, symmetrical and full respiratory excursions Cardiovascular: normal position and quality of the  apical impulse, regular rhythm, normal first and paradoxically split second heart sounds, no  murmurs, rubs or gallops Abdomen: no tenderness or distention, no masses by palpation, no abnormal pulsatility or arterial bruits, normal bowel sounds, no hepatosplenomegaly Extremities: no clubbing, cyanosis or edema; 2+ radial, ulnar and brachial pulses bilaterally; 2+ right femoral, posterior tibial and dorsalis pedis pulses; 2+ left femoral, posterior tibial and dorsalis pedis pulses; no subclavian or femoral bruits Neurological: grossly nonfocal Psych: euthymic mood, full affect   EKG:  EKG is not ordered today.  Recent Labs: 12/09/2014: B Natriuretic Peptide 36.1 12/15/2014: Hemoglobin 12.4; Platelets 195 12/29/2014: BUN 24*; Creatinine, Ser 0.67; Potassium 4.6; Sodium 134*    Lipid Panel    Component Value Date/Time   CHOL  11/30/2007 0510    158  ATP III CLASSIFICATION:  <200     mg/dL   Desirable  200-239  mg/dL   Borderline High  >=240    mg/dL   High   TRIG 174* 11/30/2007 0510   HDL 43 11/30/2007 0510   CHOLHDL 3.7 11/30/2007 0510   VLDL 35 11/30/2007 0510   LDLCALC  11/30/2007 0510    80        Total Cholesterol/HDL:CHD Risk Coronary Heart Disease Risk Table                     Men   Women  1/2 Average Risk   3.4   3.3      Wt Readings from Last 3 Encounters:  04/03/15 191 lb 1.6 oz (86.682 kg)  03/01/15 197 lb 12.8 oz (89.721 kg)  01/22/15 196 lb (88.905 kg)      ASSESSMENT AND PLAN:  1.Chronic diastolic heart failure, NYHA class I 2. Complete heart block, pacemaker dependent 3. Normal dual chamber pacemaker function - device not capable of remote monitoring 4. Minor, asymptomatic CAD 5. HTN, BP low today. Stop atenolol 6. Hyperlipidemia on statin 7. Mixed obstructive and restrictive lung disease with persistent hypoxemia and mild secondary pulmonary artery HTN   Current medicines are reviewed at length with the patient today.  The patient does not have  concerns regarding medicines.  The following changes have been made:  Stop atenolol  Labs/ tests ordered today include:  Orders Placed This Encounter  Procedures  . Implantable device check     Patient Instructions  Your physician wants you to follow-up in: 6 Months. You will receive a reminder letter in the mail two months in advance. If you don't receive a letter, please call our office to schedule the follow-up appointment.  Your physician has recommended you make the following change in your medication: STOP Atenolol      Signed, Tammy Klein, MD  04/03/2015 8:55 PM    Tammy Klein, MD, [facility] [phone] office [phone] pager

## 2015-04-06 DIAGNOSIS — E780 Pure hypercholesterolemia: Secondary | ICD-10-CM | POA: Diagnosis not present

## 2015-04-06 DIAGNOSIS — E1165 Type 2 diabetes mellitus with hyperglycemia: Secondary | ICD-10-CM | POA: Diagnosis not present

## 2015-04-25 ENCOUNTER — Encounter: Payer: Self-pay | Admitting: Cardiovascular Disease

## 2015-05-09 DIAGNOSIS — M170 Bilateral primary osteoarthritis of knee: Secondary | ICD-10-CM | POA: Diagnosis not present

## 2015-05-09 DIAGNOSIS — M1711 Unilateral primary osteoarthritis, right knee: Secondary | ICD-10-CM | POA: Diagnosis not present

## 2015-05-09 DIAGNOSIS — M1712 Unilateral primary osteoarthritis, left knee: Secondary | ICD-10-CM | POA: Diagnosis not present

## 2015-05-11 ENCOUNTER — Other Ambulatory Visit: Payer: Self-pay | Admitting: *Deleted

## 2015-05-11 MED ORDER — FUROSEMIDE 40 MG PO TABS: 40.0000 mg | ORAL_TABLET | Freq: Every day | ORAL | Status: DC

## 2015-05-11 MED ORDER — POTASSIUM CHLORIDE CRYS ER 20 MEQ PO TBCR: 20.0000 meq | EXTENDED_RELEASE_TABLET | Freq: Every day | ORAL | Status: DC

## 2015-05-15 DIAGNOSIS — M5136 Other intervertebral disc degeneration, lumbar region: Secondary | ICD-10-CM | POA: Diagnosis not present

## 2015-05-15 DIAGNOSIS — M5416 Radiculopathy, lumbar region: Secondary | ICD-10-CM | POA: Diagnosis not present

## 2015-05-17 DIAGNOSIS — M170 Bilateral primary osteoarthritis of knee: Secondary | ICD-10-CM | POA: Diagnosis not present

## 2015-05-22 DIAGNOSIS — H2511 Age-related nuclear cataract, right eye: Secondary | ICD-10-CM | POA: Diagnosis not present

## 2015-05-24 DIAGNOSIS — M1712 Unilateral primary osteoarthritis, left knee: Secondary | ICD-10-CM | POA: Diagnosis not present

## 2015-05-24 DIAGNOSIS — M170 Bilateral primary osteoarthritis of knee: Secondary | ICD-10-CM | POA: Diagnosis not present

## 2015-05-24 DIAGNOSIS — M1711 Unilateral primary osteoarthritis, right knee: Secondary | ICD-10-CM | POA: Diagnosis not present

## 2015-05-28 DIAGNOSIS — H2511 Age-related nuclear cataract, right eye: Secondary | ICD-10-CM | POA: Diagnosis not present

## 2015-06-19 DIAGNOSIS — M5416 Radiculopathy, lumbar region: Secondary | ICD-10-CM | POA: Diagnosis not present

## 2015-06-19 DIAGNOSIS — M5136 Other intervertebral disc degeneration, lumbar region: Secondary | ICD-10-CM | POA: Diagnosis not present

## 2015-06-25 ENCOUNTER — Telehealth: Payer: Self-pay | Admitting: Cardiovascular Disease

## 2015-06-25 NOTE — Telephone Encounter (Signed)
Pt asking if OK to get on an exercise machine that "you stand on and it vibrates". She couldn't tell me any details about this.  She made comparison to the old belt vibrating exercise machines. She is checking to see if there would be any concerns for PM or other health concerns.  Will route to Dr. Sallyanne Kuster for best advice.

## 2015-06-25 NOTE — Telephone Encounter (Signed)
Please call,wants to know if she can exercise on the machine that vibrates you? She is concerned,because she has a pacemaker.

## 2015-06-25 NOTE — Telephone Encounter (Signed)
She has complete heart block and is pacemaker dependent, therefore at risk for serious complications if there is external electromagnetic interference. However not sure what the "vibrating exercise machine" is. They she have any more details about this device? Does the device manufacturer manual suggest pacemakers might be a problem? Need a little more information please.

## 2015-06-26 NOTE — Telephone Encounter (Signed)
Pt states she unfortunately does not have any more information about this, not sure if she will do at all. She does not plan to investigate further at this time. Will close encounter.

## 2015-07-06 ENCOUNTER — Telehealth: Payer: Self-pay | Admitting: Cardiovascular Disease

## 2015-07-06 NOTE — Telephone Encounter (Signed)
Line busy when dialed. 

## 2015-07-06 NOTE — Telephone Encounter (Signed)
Yes, she can cut it in half, but needs to keep a close eye on her weight every morning. If she gains more than 3 lb from current weight, go back to the full 40 mg dose.

## 2015-07-06 NOTE — Telephone Encounter (Signed)
Pt called in wanting to know if she can cut her dosage of Furosemide in half, since she is doing well with her salt intake. Please f/u with her   Thanks

## 2015-07-06 NOTE — Telephone Encounter (Signed)
Spoke to patient, she wanted to know if OK to cut her furosemide. Feels like it's too much medicine.  She wanted to see if she could scale to 20mg  daily and if she gets swelling to return to 40mg .  She denies any specific acute concerns, but stated she feels like she is adhering well to salt restrictions and medication compliance.  Informed her I was not sure if Dr. Sallyanne Kuster would recommend cutting the dose. Would defer to him.  Pt aware Dr. Sallyanne Kuster not in office today and that we will return call w/ advice once he has responded.

## 2015-07-09 NOTE — Telephone Encounter (Signed)
Instructions relayed to patient regarding medication change, she verbalized understanding.

## 2015-07-17 DIAGNOSIS — M5416 Radiculopathy, lumbar region: Secondary | ICD-10-CM | POA: Diagnosis not present

## 2015-07-17 DIAGNOSIS — Z6831 Body mass index (BMI) 31.0-31.9, adult: Secondary | ICD-10-CM | POA: Diagnosis not present

## 2015-07-17 DIAGNOSIS — M5440 Lumbago with sciatica, unspecified side: Secondary | ICD-10-CM | POA: Diagnosis not present

## 2015-07-17 DIAGNOSIS — M5136 Other intervertebral disc degeneration, lumbar region: Secondary | ICD-10-CM | POA: Diagnosis not present

## 2015-07-19 DIAGNOSIS — Z23 Encounter for immunization: Secondary | ICD-10-CM | POA: Diagnosis not present

## 2015-08-02 ENCOUNTER — Telehealth: Payer: Self-pay | Admitting: Cardiovascular Disease

## 2015-08-02 NOTE — Telephone Encounter (Signed)
No answer when dialed. 

## 2015-08-02 NOTE — Telephone Encounter (Signed)
Patient called, explains she has been getting shots in back for leg and back pain.  She was also prescribed sleep medication and wants to know if concerns for interaction w/ meds, pacemaker, etc.  Nortryptiline HCL 25mg  - medication she has been recommended.   She also notes concern for SEs r/t cognitive function. She has not started med yet. If OTC alternative such as PM analgesic or other medication is preferred, she would like to know.  Will route to [place] for considerations.

## 2015-08-02 NOTE — Telephone Encounter (Signed)
Please call,she says she have some questions that she wants you to ask Dr C.

## 2015-08-02 NOTE — Telephone Encounter (Signed)
rcommendations communicated to patient, who voiced understanding.

## 2015-08-02 NOTE — Telephone Encounter (Signed)
Nortriptyline can increase drowsiness/lethargy when mixed with gabapentin.  This may help her sleep better.  They are not contra-indicated, but would suggest she only use the nortriptyline as needed.  OTC sleep aids are usually just benadryl, which would probably be less potent.   Some people get cognitive SE from nortriptyline, if she takes it only at bedtime this is less likely to be a problem.

## 2015-09-17 DIAGNOSIS — M5416 Radiculopathy, lumbar region: Secondary | ICD-10-CM | POA: Diagnosis not present

## 2015-10-10 DIAGNOSIS — I110 Hypertensive heart disease with heart failure: Secondary | ICD-10-CM | POA: Diagnosis not present

## 2015-10-10 DIAGNOSIS — M4806 Spinal stenosis, lumbar region: Secondary | ICD-10-CM | POA: Diagnosis not present

## 2015-10-10 DIAGNOSIS — E7800 Pure hypercholesterolemia, unspecified: Secondary | ICD-10-CM | POA: Diagnosis not present

## 2015-10-10 DIAGNOSIS — I700 Atherosclerosis of aorta: Secondary | ICD-10-CM | POA: Diagnosis not present

## 2015-10-10 DIAGNOSIS — I272 Other secondary pulmonary hypertension: Secondary | ICD-10-CM | POA: Diagnosis not present

## 2015-10-10 DIAGNOSIS — J449 Chronic obstructive pulmonary disease, unspecified: Secondary | ICD-10-CM | POA: Diagnosis not present

## 2015-10-10 DIAGNOSIS — Z23 Encounter for immunization: Secondary | ICD-10-CM | POA: Diagnosis not present

## 2015-10-10 DIAGNOSIS — E119 Type 2 diabetes mellitus without complications: Secondary | ICD-10-CM | POA: Diagnosis not present

## 2015-10-10 DIAGNOSIS — Z950 Presence of cardiac pacemaker: Secondary | ICD-10-CM | POA: Diagnosis not present

## 2015-10-10 DIAGNOSIS — I442 Atrioventricular block, complete: Secondary | ICD-10-CM | POA: Diagnosis not present

## 2015-10-11 ENCOUNTER — Telehealth: Payer: Self-pay | Admitting: Internal Medicine

## 2015-10-11 NOTE — Telephone Encounter (Signed)
Per 03/01/15 OV: Patient Instructions     Try prilosec 20mg  Take 30-60 min before first meal of the day and Pepcid (famotidine) 20 mg one at bedtime until frog in throat is completely gone for at least a week   GERD (REFLUX) is an extremely common cause of respiratory symptoms just like yours , many times with no obvious heartburn at all.   It can be treated with medication, but also with lifestyle changes including elevation of the head of your bed (ideally with 6 inch bed blocks), Smoking cessation, avoidance of late meals, excessive alcohol, and avoid fatty foods, chocolate, peppermint, colas, red wine, and acidic juices such as orange juice.  NO MINT OR MENTHOL PRODUCTS SO NO COUGH DROPS  USE SUGARLESS CANDY INSTEAD (Jolley ranchers or Stover's or Life Savers) or even ice chips will also do - the key is to swallow to prevent all throat clearing. NO OIL BASED VITAMINS - use powdered substitutes.  If breathing getting worse or cough worse return right away  ----  Pt has no pending appt.  Called spoke with pt. She received a letter from [facility] she needs her O2 recert with OV. appt scheduled to see MW on 2/27 at 1:45. Nothing further needed

## 2015-10-15 DIAGNOSIS — M5136 Other intervertebral disc degeneration, lumbar region: Secondary | ICD-10-CM | POA: Diagnosis not present

## 2015-10-15 DIAGNOSIS — Z6832 Body mass index (BMI) 32.0-32.9, adult: Secondary | ICD-10-CM | POA: Diagnosis not present

## 2015-10-15 DIAGNOSIS — M5416 Radiculopathy, lumbar region: Secondary | ICD-10-CM | POA: Diagnosis not present

## 2015-10-15 DIAGNOSIS — M5440 Lumbago with sciatica, unspecified side: Secondary | ICD-10-CM | POA: Diagnosis not present

## 2015-10-29 ENCOUNTER — Encounter: Payer: Self-pay | Admitting: Internal Medicine

## 2015-10-29 ENCOUNTER — Ambulatory Visit (INDEPENDENT_AMBULATORY_CARE_PROVIDER_SITE_OTHER): Payer: Medicare Other | Admitting: Internal Medicine

## 2015-10-29 VITALS — BP 104/70 | HR 91 | Ht 67.0 in | Wt 199.6 lb

## 2015-10-29 DIAGNOSIS — J9611 Chronic respiratory failure with hypoxia: Secondary | ICD-10-CM | POA: Diagnosis not present

## 2015-10-29 DIAGNOSIS — R05 Cough: Secondary | ICD-10-CM

## 2015-10-29 DIAGNOSIS — J449 Chronic obstructive pulmonary disease, unspecified: Secondary | ICD-10-CM | POA: Diagnosis not present

## 2015-10-29 DIAGNOSIS — R058 Other specified cough: Secondary | ICD-10-CM

## 2015-10-29 NOTE — Assessment & Plan Note (Addendum)
pfts 12/13/14  FEV1  1.06 (53%) ratio 57  And dlco 30 corrects to 77%   I had an extended final summary discussion with the patient reviewing all relevant studies completed to date and  lasting 15 to 20 minutes of a 25 minute visit on the following issues:    As I explained to this patient in detail:  although there may be copd present, it may not be clinically relevant:   it does not appear to be limiting activity tolerance any more than a set of worn tires limits someone from driving a car  around a parking lot.  A new set of Michelins might look good but would have no perceived impact on the performance of the car and would not be worth the cost.  That is to say:   this pt is so sedentary I don't recommend aggressive pulmonary rx at this point unless limiting symptoms arise or acute exacerbations become as issue, neither of which is the case now.  I asked the patient to contact this office at any time in the future should either of these problems arise.

## 2015-10-29 NOTE — Progress Notes (Signed)
Subjective:    [name], female    DOB: 1941/07/08     MRN: [id number]    Brief patient profile:  28 yowf quit smoking around 1990 with documented GOLD II copd 12/2014  with no resp problems until spring of 2016 > admit cc progressively SOB x two weeks with orthopnea, PND and swelling in her abdomen. Some coughing which is worse when lying down s sign peripheral edema:  Admit date: 12/06/2014 Discharge date: 12/15/2014  PCP: Simona Huh, MD Primary Cardiologist: Dr. Sallyanne Kuster  Primary Discharge Diagnosis: Accelerated hypertension with diastolic congestive heart failure, NYHA class 3 - weight 206 pounds at discharge  Secondary Discharge Diagnosis:   CHB (complete heart block)   Pacemaker - dual chamber St Jude, 2009  Essential hypertension  Normal coronary arteries angiography 2002  Hyperlipidemia  Acute congestive heart failure  Congestive heart disease  Dyspnea  SOB (shortness of breath)  Sleep apnea  Hypoxemia  Acute respiratory failure with hypoxemia  Acute pulmonary edema  Pulmonary hypertension    01/18/2015 [facility]st [facility] office visit/ Ladonne Sharples / referred by Cards/ Miguel Rota re copd/02 desp resp failure   Chief Complaint  Patient presents with  . Pulmonary Consult    Referred by Miguel Rota, PA for eval of Hypoxia. Pt c/o SOB "all the time" for the past 3 wks.  She has been clearing her throat alot for the past couple wks.   new doe x 3 steps but not wearing 02 / steps were  not a problem before her admit (confirmed only sob x 2 weeks pre-admit)  Also new sob talking and lump in throat / dysyphagia since discharge > that was not present prior to d/c on ACEi new start assoc with dry daytime cough/ urge to clear throat Now lying flat at hs ok on 02 comfortably  but confused as to how to use 02 sitting/walking rec Only use the 02 at bedtime and keep a portable system handy for as needed use but at this point your  walking is not limited by lack of 02  Stop lisinopril and your breathing should improve back to where it was previously  Start valsartan 80 mg daily in place of lisinopril Please schedule a follow up office visit in 6 weeks, call sooner if needed and we'll re-evaluate your needs then    03/01/2015 f/u ov/[facility] Mchugh re:  Noct 02 dep/GOLD II  copd/ uacs ? How much acei related off x 6 weeks  Chief Complaint  Patient presents with  . Follow-up    Pt states that her breathing has improved some. She states she has only had to use her o2 a few times during the day. No new co's today.   walking at home is better but not like it was due to limiting sob  But also back and knees limit her  Was able to do some house work but can't do sweeper s doe/ sats ok  Sleeping on 02 2lpm flat Taking gabapentin 300 x 2  = 6 per day  / still frog in throat daytime but not as bad as while on ACEi  rec Try prilosec 20mg   Take 30-60 min before first meal of the day and Pepcid (famotidine) 20 mg one at bedtime until frog in throat  is completely gone for at least a week  GERD diet  If breathing getting worse or cough worse return right away    10/29/2015  f/u ov/[facility] Hedeen re: GOLD II copd/  obesity / noct 02 only  And no maint  resp rx / did not follow gerd rx or diet - using mineral oil  Chief Complaint  Patient presents with  . Follow-up    Breathing is doing well. She is here to recertify for o2. No new co's today.   push buggy with HT no 02 just 2lpm at hs  Still with constant sensation of throat mucus day >> noct    No obvious day to day or daytime variabilty or assoc excess/ purulent mucus production or cp or chest tightness, subjective wheeze overt sinus or hb symptoms. No unusual exp hx or h/o childhood pna/ asthma or knowledge of premature birth.  Sleeping ok without nocturnal  or early am exacerbation  of respiratory  c/o's or need for noct saba. Also denies any obvious fluctuation of symptoms with weather or  environmental changes or other aggravating or alleviating factors except as outlined above   Current Medications, Allergies, Complete Past Medical History, Past Surgical History, Family History, and Social History were reviewed in [employer] record.  ROS  The following are not active complaints unless bolded sore throat, dysphagia, dental problems, itching, sneezing,  nasal congestion or excess/ purulent secretions, ear ache,   fever, chills, sweats, unintended wt loss, pleuritic or exertional cp, hemoptysis,  orthopnea pnd or leg swelling, presyncope, palpitations, abdominal pain, anorexia, nausea, vomiting, diarrhea  or change in bowel or urinary habits, change in stools or urine, dysuria,hematuria,  rash, arthralgias, visual complaints, headache, numbness weakness or ataxia or problems with walking or coordination,  change in mood/affect or memory.           Objective:   Physical Exam   amb wf nad   03/01/2015        198 > 10/29/2015  200     01/18/15 202 lb (91.627 kg)  01/10/15 197 lb (89.359 kg)  01/09/15 203 lb 1.6 oz (92.126 kg)    Vital signs reviewed   HEENT: full dentures , nl turbinates, and orophanx. Nl external ear canals without cough reflex   NECK :  without JVD/Nodes/TM/ nl carotid upstrokes bilaterally   LUNGS: no acc muscle use, clear to A and P bilaterally without cough on insp or exp maneuvers   CV:  RRR  no s3 or murmur or increase in P2, no edema   ABD:  soft and nontender with nl excursion in the supine position. No bruits or organomegaly, bowel sounds nl  MS:  warm without deformities, calf tenderness, cyanosis or clubbing  SKIN: warm and dry without lesions    NEURO:  alert, approp, no deficits             Assessment & Plan:

## 2015-10-29 NOTE — Assessment & Plan Note (Signed)
Trial off acei 01/18/15 > marginally improved sob/ throat lump - trial of max gerd rx 10/29/2015   Did not follow prev recs so repeat recs and f/u ent prn - see avs

## 2015-10-29 NOTE — Assessment & Plan Note (Signed)
Noct 02 2lpm since admit 12/2014  - 10/29/2015   Walked RA x one lap @ 185 stopped due to  Weak leg no sob, sat still 89%  - ONO rec 10/29/2015 >>>   Likely still 02 dep at hs but not really limited by desat with activity

## 2015-10-29 NOTE — Patient Instructions (Addendum)
Try prilosec 20mg   Take 30-60 min before first meal of the day and Pepcid (famotidine) 20 mg one at bedtime until frog in throat  is completely gone for at least a week   GERD (REFLUX)  is an extremely common cause of respiratory symptoms just like yours , many times with no obvious heartburn at all.    It can be treated with medication, but also with lifestyle changes including elevation of the head of your bed (ideally with 6 inch  bed blocks),  Smoking cessation, avoidance of late meals, excessive alcohol, and avoid fatty foods, chocolate, peppermint, colas, red wine, and acidic juices such as orange juice.  NO MINT OR MENTHOL PRODUCTS SO NO COUGH DROPS  USE SUGARLESS CANDY INSTEAD (Jolley ranchers or Stover's or Life Savers) or even ice chips will also do - the key is to swallow to prevent all throat clearing. NO OIL BASED VITAMINS - use powdered substitutes - NO mineral oil   Please see patient coordinator before you leave today  to schedule overnight oxygen levels on Room air   Pulmonary follow up is as needed if breathing gets worse - if hoarseness not better on the above plan rec ENT eval next

## 2015-10-29 NOTE — Assessment & Plan Note (Signed)
Complicated by HBP/ Hyperlipidemia  Body mass index is 31.25    Lab Results  Component Value Date   TSH 2.775   11/29/2007     Contributing to gerd tendency/ doe/reviewed the need and the process to achieve and maintain neg calorie balance > defer f/u primary care including intermittently monitoring thyroid status

## 2015-10-31 ENCOUNTER — Encounter: Payer: Self-pay | Admitting: *Deleted

## 2015-11-08 ENCOUNTER — Telehealth: Payer: Self-pay | Admitting: Internal Medicine

## 2015-11-08 DIAGNOSIS — J449 Chronic obstructive pulmonary disease, unspecified: Secondary | ICD-10-CM

## 2015-11-08 NOTE — Telephone Encounter (Signed)
ONO on RA from 10/05/15 reviewed by MW  Per MW- study is okay, no need for o2  ATC, NA and no option to leave msg, [facility]

## 2015-11-09 NOTE — Telephone Encounter (Signed)
ATC, NA and unable to leave msg, [facility]

## 2015-11-16 NOTE — Telephone Encounter (Signed)
ATC, NA and VM full

## 2015-11-20 NOTE — Telephone Encounter (Signed)
Attempted to call pt. Voicemail is full. Will try back. 

## 2015-11-26 NOTE — Telephone Encounter (Signed)
Patient notified. Oxygen D/C'd. Nothing further needed.

## 2015-12-06 DIAGNOSIS — M5116 Intervertebral disc disorders with radiculopathy, lumbar region: Secondary | ICD-10-CM | POA: Diagnosis not present

## 2015-12-11 ENCOUNTER — Other Ambulatory Visit: Payer: Self-pay | Admitting: Physician Assistant

## 2015-12-11 NOTE — Telephone Encounter (Signed)
REFILL 

## 2015-12-20 DIAGNOSIS — H26493 Other secondary cataract, bilateral: Secondary | ICD-10-CM | POA: Diagnosis not present

## 2015-12-20 DIAGNOSIS — Z961 Presence of intraocular lens: Secondary | ICD-10-CM | POA: Diagnosis not present

## 2015-12-20 DIAGNOSIS — H40012 Open angle with borderline findings, low risk, left eye: Secondary | ICD-10-CM | POA: Diagnosis not present

## 2015-12-20 DIAGNOSIS — H2589 Other age-related cataract: Secondary | ICD-10-CM | POA: Diagnosis not present

## 2015-12-28 DIAGNOSIS — D225 Melanocytic nevi of trunk: Secondary | ICD-10-CM | POA: Diagnosis not present

## 2016-01-03 ENCOUNTER — Ambulatory Visit (INDEPENDENT_AMBULATORY_CARE_PROVIDER_SITE_OTHER): Payer: Medicare Other | Admitting: Cardiovascular Disease

## 2016-01-03 ENCOUNTER — Encounter: Payer: Self-pay | Admitting: Cardiovascular Disease

## 2016-01-03 VITALS — BP 116/76 | HR 70 | Ht 67.0 in | Wt 203.6 lb

## 2016-01-03 DIAGNOSIS — I442 Atrioventricular block, complete: Secondary | ICD-10-CM

## 2016-01-03 DIAGNOSIS — J449 Chronic obstructive pulmonary disease, unspecified: Secondary | ICD-10-CM | POA: Diagnosis not present

## 2016-01-03 DIAGNOSIS — I110 Hypertensive heart disease with heart failure: Secondary | ICD-10-CM | POA: Diagnosis not present

## 2016-01-03 DIAGNOSIS — I5030 Unspecified diastolic (congestive) heart failure: Secondary | ICD-10-CM | POA: Diagnosis not present

## 2016-01-03 DIAGNOSIS — J9611 Chronic respiratory failure with hypoxia: Secondary | ICD-10-CM | POA: Diagnosis not present

## 2016-01-03 DIAGNOSIS — Z950 Presence of cardiac pacemaker: Secondary | ICD-10-CM

## 2016-01-03 NOTE — Progress Notes (Signed)
[name], female   DOB: [birth year], 75 y.o.   MRN: [id number]    Cardiology Office Note    Date:  01/05/2016   ID:  [name], DOB 1940/12/25, MRN [id number]  PCP:  Simona Huh, MD  Cardiologist:   Sanda Klein, MD   Chief Complaint  Patient presents with  . Follow-up    pacer check    History of Present Illness:  [name] is a 75 y.o. female with complete heart block/pacemaker dependent, St. Jude pacemaker implanted [id number], chronic diastolic heart failure, hypertension, COPD with chronic respiratory insufficiency on home oxygen at night only. She returns for a pacemaker follow-up. She denies interval problems with change in her usual dyspnea (NYHA functional class III), denies edema, angina, palpitations, syncope, focal neurological complaints, bleeding problems, claudication or other vascular problems.  Her St. Jude Zephyr XL device still has 2.7 5-4 years of estimated generator longevity. She has 39% atrial pacing and greater than 99% ventricular pacing. She has had a small number of episodes of atrial mode switch, none of them in excess of 20 seconds duration, many of them due to sensor driven competitive pacing. No atrial fibrillation is seen.  Had minor coronary artery disease by previous angiography and has never had angina pectoris. She had acute HF exacerbation due to severe hypertension in the weeks after her husband passed away about a year ago. At that time she underwent coronary angiography that showed very minor disease (30% stenosis at the takeoff of the first diagonal artery), normal pulmonary wedge pressure and mild pulmonary hypertension (38/16).  Past Medical History  Diagnosis Date  . Hypertension   . Hypercholesterolemia   . Pacemaker   . Dysrhythmia   . Neuropathy of both feet ([place])   . Shortness of breath     exertional  . Venous insufficiency (chronic) (peripheral)   . CHB (complete heart block) ([place]) 09/19/2013  . HTN  (hypertension) 09/19/2013  . Hyperlipidemia 09/19/2013  . Paralysis of right hand 2007    "after spinal cord injury/OR"  . CHF (congestive heart failure) ([place]) dx'd 12/06/2014  . Coronary artery disease     normal coronaries by 09/10/00 cath  . Myocardial infarction ([place]) dx'd 2009  . Diabetes mellitus     hga1c 7.1 no meds  . Type II diabetes mellitus ([place])     "had it years ago; lost weight after neck OR in 2007; glucose went way down; recently dx'd as returned recently" (12/07/2014)  . Arthritis     "hands, spine" (12/07/2014)  . Chronic pain     "since neck OR in 2007"  . Urinary incontinence     "since 2007's OR"    Past Surgical History  Procedure Laterality Date  . Cardiac pacemaker placement  12/01/2007    St.Jude Zephyr XLDR [id number], dual chamber  . Anterior cervical decomp/discectomy fusion  2007  . Lumbar laminectomy/decompression microdiscectomy  03/02/2012    Procedure: LUMBAR LAMINECTOMY/DECOMPRESSION MICRODISCECTOMY 2 LEVELS;  Surgeon: Floyce Stakes, MD;  Location: [facility];  Service: Neurosurgery;  Laterality: Bilateral;  Lumbar three, lumbar four, lumbar five Laminectomy/Foraminotomy  . Transthoracic echocardiogram  11/30/2007    normal echo, mean transaortic valve gradient 39mmHg  . Nm myocar perf wall motion  10/24/2008    protocol:Lexiscan, EF63%, normal study, scan negative for ischemia  . Cardiovascular stress test  08/28/2000    Cardiolite perfusion study, EF58%, low risk study,   . Lower ext. dopplers  06/12/2011  no evidence of thrombus, all vessels normal in size, no evidence of insuffiency  . Back surgery    . Laparoscopic cholecystectomy  1995  . Knee arthroscopy Right   . Dilation and curettage of uterus  1960's  . Cardiac catheterization  09/10/2000    normal Coronary arteries, EF60%,   . Left and right heart catheterization with coronary angiogram N/A 12/14/2014    Procedure: LEFT AND RIGHT HEART CATHETERIZATION WITH CORONARY ANGIOGRAM;  Surgeon:  Peter M Martinique, MD;  Location: [facility] CATH LAB;  Service: Cardiovascular;  Laterality: N/A;    Current Medications: Outpatient Prescriptions Prior to Visit  Medication Sig Dispense Refill  . acetaminophen (TYLENOL) 500 MG tablet Take 500 mg by mouth 4 (four) times daily -  with meals and at bedtime.    Marland Kitchen aspirin EC 81 MG tablet Take 81 mg by mouth daily.    . furosemide (LASIX) 40 MG tablet TAKE 1 TABLET (40 MG TOTAL) BY MOUTH DAILY. 30 tablet 0  . gabapentin (NEURONTIN) 300 MG capsule Take 600 mg by mouth 3 (three) times daily.     . naproxen sodium (ANAPROX) 220 MG tablet Take 220 mg by mouth 2 (two) times daily with a meal.    . OXYGEN 2lpm with sleep only  [facility]    . potassium chloride SA (K-DUR,KLOR-CON) 20 MEQ tablet Take 1 tablet (20 mEq total) by mouth daily. 30 tablet 11  . pravastatin (PRAVACHOL) 40 MG tablet Take 40 mg by mouth daily.    . psyllium (METAMUCIL) 58.6 % packet Take 1 packet by mouth daily.    . valsartan (DIOVAN) 80 MG tablet Take 1 tablet (80 mg total) by mouth daily. 30 tablet 11   No facility-administered medications prior to visit.     Allergies:   Review of patient's allergies indicates no known allergies.   Social History   Social History  . Marital Status: Widowed    Spouse Name: N/A  . Number of Children: N/A  . Years of Education: N/A   Occupational History  . Self employed- Academic librarian business     Social History Main Topics  . Smoking status: Former Smoker -- 1.00 packs/day for 40 years    Types: Cigarettes    Quit date: 03/02/1992  . Smokeless tobacco: Never Used  . Alcohol Use: No  . Drug Use: No  . Sexual Activity: No   Other Topics Concern  . None   Social History Narrative     Family History:  The patient's \  family history includes Diabetes in her father and mother; Heart attack in her father; Heart disease in her father and mother; Hyperlipidemia in her father and mother; Hypertension in her father and mother.   ROS:   Please see the  history of present illness.    ROS All other systems reviewed and are negative.   PHYSICAL EXAM:   VS:  BP 116/76 mmHg  Pulse 70  Ht 5\' 7"  (1.702 m)  Wt 92.352 kg (203 lb 9.6 oz)  BMI 31.88 kg/m2  SpO2 93%   GEN: Well nourished, well developed, in no acute distress HEENT: normal Neck: no JVD, carotid bruits, or masses Cardiac: Paradoxically split S2 ,RRR; no murmurs, rubs, or gallops,no edema, healthy left subclavian pacemaker site  Respiratory:  Distant breath sounds, but otherwise clear to auscultation bilaterally, normal work of breathing GI: soft, nontender, nondistended, + BS MS: no deformity or atrophy Skin: warm and dry, no rash Neuro:  Alert and Oriented x 3,  Strength and sensation are intact Psych: euthymic mood, full affect  Wt Readings from Last 3 Encounters:  01/03/16 92.352 kg (203 lb 9.6 oz)  10/29/15 90.538 kg (199 lb 9.6 oz)  04/03/15 86.682 kg (191 lb 1.6 oz)      Studies/Labs Reviewed:   EKG:  EKG is ordered today.  The ekg ordered today demonstrates AV sequential pacing, QTC 498 ms  Recent Labs: No results found for requested labs within last 365 days.   Lipid Panel    Component Value Date/Time   CHOL  11/30/2007 0510    158        ATP III CLASSIFICATION:  <200     mg/dL   Desirable  200-239  mg/dL   Borderline High  >=240    mg/dL   High   TRIG 174* 11/30/2007 0510   HDL 43 11/30/2007 0510   CHOLHDL 3.7 11/30/2007 0510   VLDL 35 11/30/2007 0510   LDLCALC  11/30/2007 0510    80        Total Cholesterol/HDL:CHD Risk Coronary Heart Disease Risk Table                     Men   Women  1/2 Average Risk   3.4   3.3    Additional studies/ records that were reviewed today include:  Dr. Gustavus Bryant notes    ASSESSMENT:    1. CHB (complete heart block) ([place])   2. Accelerated hypertension with diastolic congestive heart failure, NYHA class 3 ([place])   3. Pacemaker - dual chamber St Jude, 2009   4. Chronic respiratory failure with hypoxia ([facility])   5.  COPD GOLD II      PLAN:  In order of problems listed above:  1. CHB: pacemaker dependent 2. CHF: Appears euvolemic, very sedentary so hard to assess functional status today. Her weight has increased compared to last year, but she has no edema or other signs of hypervolemia. She thinks she has gained weight due to poor eating habits.  3. PPM: Her pacemaker is not amenable to remote monitoring. For the time being will continue in office checks every 6 months, but in a year or so may decide to increase the frequency to every 3 months 4. On home O2 5. COPD:  22 with Dr. Melvyn Novas    Medication Adjustments/Labs and Tests Ordered: Current medicines are reviewed at length with the patient today.  Concerns regarding medicines are outlined above.  Medication changes, Labs and Tests ordered today are listed in the Patient Instructions below. Patient Instructions  Dr Sallyanne Kuster recommends that you continue on your current medications as directed. Please refer to the Current Medication list given to you today.  Dr Sallyanne Kuster recommends that you schedule a follow-up appointment in 6 months. You will receive a reminder letter in the mail two months in advance. If you don't receive a letter, please call our office to schedule the follow-up appointment.  If you need a refill on your cardiac medications before your next appointment, please call your pharmacy.    Mikael Spray, MD  01/05/2016 8:43 PM    [facility] [place], [place], [place]  [address] Phone: [phone]; Fax: [phone]

## 2016-01-03 NOTE — Patient Instructions (Signed)
Dr Sallyanne Kuster recommends that you continue on your current medications as directed. Please refer to the Current Medication list given to you today.  Dr Sallyanne Kuster recommends that you schedule a follow-up appointment in 6 months. You will receive a reminder letter in the mail two months in advance. If you don't receive a letter, please call our office to schedule the follow-up appointment.  If you need a refill on your cardiac medications before your next appointment, please call your pharmacy.

## 2016-01-05 DIAGNOSIS — E669 Obesity, unspecified: Secondary | ICD-10-CM | POA: Insufficient documentation

## 2016-01-07 ENCOUNTER — Other Ambulatory Visit: Payer: Self-pay | Admitting: Physician Assistant

## 2016-01-07 NOTE — Telephone Encounter (Signed)
Rx Refill

## 2016-01-12 ENCOUNTER — Other Ambulatory Visit: Payer: Self-pay | Admitting: Internal Medicine

## 2016-01-19 LAB — CUP PACEART INCLINIC DEVICE CHECK
Date Time Interrogation Session: 20170520092013
Implantable Lead Location: 753860
Lead Channel Setting Pacing Pulse Width: 0.5 ms
Lead Channel Setting Sensing Sensitivity: 4 mV
MDC IDC LEAD IMPLANT DT: 20090401
MDC IDC LEAD IMPLANT DT: 20090401
MDC IDC LEAD LOCATION: 753859
MDC IDC PG SERIAL: 1904939
MDC IDC SET LEADCHNL RA PACING AMPLITUDE: 2 V

## 2016-01-23 ENCOUNTER — Encounter: Payer: Self-pay | Admitting: Cardiovascular Disease

## 2016-02-06 ENCOUNTER — Other Ambulatory Visit: Payer: Self-pay | Admitting: Physician Assistant

## 2016-03-10 DIAGNOSIS — M5416 Radiculopathy, lumbar region: Secondary | ICD-10-CM | POA: Diagnosis not present

## 2016-03-28 DIAGNOSIS — M170 Bilateral primary osteoarthritis of knee: Secondary | ICD-10-CM | POA: Diagnosis not present

## 2016-04-08 DIAGNOSIS — E119 Type 2 diabetes mellitus without complications: Secondary | ICD-10-CM | POA: Diagnosis not present

## 2016-04-08 DIAGNOSIS — I442 Atrioventricular block, complete: Secondary | ICD-10-CM | POA: Diagnosis not present

## 2016-04-08 DIAGNOSIS — I272 Other secondary pulmonary hypertension: Secondary | ICD-10-CM | POA: Diagnosis not present

## 2016-04-08 DIAGNOSIS — Z950 Presence of cardiac pacemaker: Secondary | ICD-10-CM | POA: Diagnosis not present

## 2016-04-08 DIAGNOSIS — I700 Atherosclerosis of aorta: Secondary | ICD-10-CM | POA: Diagnosis not present

## 2016-04-08 DIAGNOSIS — E7800 Pure hypercholesterolemia, unspecified: Secondary | ICD-10-CM | POA: Diagnosis not present

## 2016-04-08 DIAGNOSIS — J449 Chronic obstructive pulmonary disease, unspecified: Secondary | ICD-10-CM | POA: Diagnosis not present

## 2016-04-08 DIAGNOSIS — E669 Obesity, unspecified: Secondary | ICD-10-CM | POA: Diagnosis not present

## 2016-04-08 DIAGNOSIS — I110 Hypertensive heart disease with heart failure: Secondary | ICD-10-CM | POA: Diagnosis not present

## 2016-04-08 DIAGNOSIS — M4806 Spinal stenosis, lumbar region: Secondary | ICD-10-CM | POA: Diagnosis not present

## 2016-04-11 DIAGNOSIS — M5416 Radiculopathy, lumbar region: Secondary | ICD-10-CM | POA: Diagnosis not present

## 2016-04-14 DIAGNOSIS — M79605 Pain in left leg: Secondary | ICD-10-CM | POA: Diagnosis not present

## 2016-04-23 DIAGNOSIS — M170 Bilateral primary osteoarthritis of knee: Secondary | ICD-10-CM | POA: Diagnosis not present

## 2016-04-23 DIAGNOSIS — M1712 Unilateral primary osteoarthritis, left knee: Secondary | ICD-10-CM | POA: Diagnosis not present

## 2016-04-23 DIAGNOSIS — M1711 Unilateral primary osteoarthritis, right knee: Secondary | ICD-10-CM | POA: Diagnosis not present

## 2016-04-30 DIAGNOSIS — M170 Bilateral primary osteoarthritis of knee: Secondary | ICD-10-CM | POA: Diagnosis not present

## 2016-05-01 ENCOUNTER — Telehealth: Payer: Self-pay | Admitting: Cardiovascular Disease

## 2016-05-01 NOTE — Telephone Encounter (Signed)
Spoke w/ pt and informed her that this is a theoretical risk. No device has ever been hacked. We do have firmware upgrade for these devices available, but our clinic (along with many other clinics across the country) has determined that the risks of installing the upgrade outweigh the benefit. If in the future we determine that it would be beneficial, the firmware upgrade is available to [place] and we will notify our patients.

## 2016-05-01 NOTE — Telephone Encounter (Signed)
[name] is calling because she hheard that her pacemaker be on recall or someone may try to hack in to her pacemaker . Please call   Thanks

## 2016-05-07 DIAGNOSIS — M170 Bilateral primary osteoarthritis of knee: Secondary | ICD-10-CM | POA: Diagnosis not present

## 2016-05-14 DIAGNOSIS — M5416 Radiculopathy, lumbar region: Secondary | ICD-10-CM | POA: Diagnosis not present

## 2016-05-19 DIAGNOSIS — M5416 Radiculopathy, lumbar region: Secondary | ICD-10-CM | POA: Diagnosis not present

## 2016-06-01 ENCOUNTER — Other Ambulatory Visit: Payer: Self-pay | Admitting: Physician Assistant

## 2016-06-03 NOTE — Telephone Encounter (Signed)
Rx request sent to pharmacy.  

## 2016-06-20 DIAGNOSIS — Z961 Presence of intraocular lens: Secondary | ICD-10-CM | POA: Diagnosis not present

## 2016-06-20 DIAGNOSIS — H2589 Other age-related cataract: Secondary | ICD-10-CM | POA: Diagnosis not present

## 2016-06-20 DIAGNOSIS — H40012 Open angle with borderline findings, low risk, left eye: Secondary | ICD-10-CM | POA: Diagnosis not present

## 2016-06-20 DIAGNOSIS — H26493 Other secondary cataract, bilateral: Secondary | ICD-10-CM | POA: Diagnosis not present

## 2016-06-24 ENCOUNTER — Encounter: Payer: Self-pay | Admitting: Cardiovascular Disease

## 2016-06-24 ENCOUNTER — Ambulatory Visit (INDEPENDENT_AMBULATORY_CARE_PROVIDER_SITE_OTHER): Payer: Medicare Other | Admitting: Cardiovascular Disease

## 2016-06-24 VITALS — BP 110/66 | HR 80 | Ht 67.0 in | Wt 202.0 lb

## 2016-06-24 DIAGNOSIS — I5032 Chronic diastolic (congestive) heart failure: Secondary | ICD-10-CM

## 2016-06-24 DIAGNOSIS — J449 Chronic obstructive pulmonary disease, unspecified: Secondary | ICD-10-CM | POA: Diagnosis not present

## 2016-06-24 DIAGNOSIS — Z950 Presence of cardiac pacemaker: Secondary | ICD-10-CM | POA: Diagnosis not present

## 2016-06-24 DIAGNOSIS — I442 Atrioventricular block, complete: Secondary | ICD-10-CM

## 2016-06-24 DIAGNOSIS — M5416 Radiculopathy, lumbar region: Secondary | ICD-10-CM

## 2016-06-24 NOTE — Progress Notes (Signed)
[name], female   DOB: [birth year], 75 y.o.   MRN: [id number]    Cardiology Office Note    Date:  06/25/2016   ID:  [name], DOB 06-13-1941, MRN [id number]  PCP:  Simona Huh, MD  Cardiologist:   Sanda Klein, MD   Chief Complaint  Patient presents with  . Follow-up    pt c/o SOB with activity    History of Present Illness:  [name] is a 75 y.o. female with complete heart block/pacemaker dependent, St. Jude pacemaker implanted [id number], chronic diastolic heart failure, hypertension, COPD with chronic respiratory insufficiency on home oxygen at night only. She returns for a pacemaker follow-up.   She has functional class II exertional dyspnea, but is limited primarily by pain related to "pinched nerves" in her back. She also has a lot of knee problems. She has recently had 3 shots in each knee (sounds like hyaluronic acid). She has also been told that she needs lumbar spine surgery (in fact 2 separate consecutive surgeries to place rods for severe scoliosis). She has been evaluated by Dr. Joya Salm and Dr. Lynann Bologna.  The patient specifically denies any chest pain at rest or exertion, dyspnea at rest, orthopnea, paroxysmal nocturnal dyspnea, syncope, palpitations, focal neurological deficits, intermittent claudication, lower extremity edema, unexplained weight gain, cough, hemoptysis or wheezing.  Her St. Jude Zephyr XL device still has 2-4 years of estimated generator longevity. She has 39% atrial pacing and greater than >99% ventricular pacing. She has had a small number of episodes of atrial mode switch, none of them in excess of 20 seconds duration, many of them due to sensor driven competitive pacing. No atrial fibrillation is seen.  She had minor coronary artery disease by previous angiography and has never had angina pectoris. She had acute HF exacerbation due to severe hypertension in the weeks after her husband passed away in Dec 04, 2014. At that time she  underwent coronary angiography that showed very minor disease (30% stenosis at the takeoff of the first diagonal artery), normal pulmonary wedge pressure and mild pulmonary hypertension (38/16).  Past Medical History:  Diagnosis Date  . Arthritis    "hands, spine" (12/07/2014)  . CHB (complete heart block) ([place]) 09/19/2013  . CHF (congestive heart failure) ([place]) dx'd 12/06/2014  . Chronic pain    "since neck OR in 2005/12/03"  . Coronary artery disease    normal coronaries by 09/10/00 cath  . Diabetes mellitus    hga1c 7.1 no meds  . Dysrhythmia   . HTN (hypertension) 09/19/2013  . Hypercholesterolemia   . Hyperlipidemia 09/19/2013  . Hypertension   . Myocardial infarction dx'd 12-04-07  . Neuropathy of both feet   . Pacemaker   . Paralysis of right hand ([place]) 03-Dec-2005   "after spinal cord injury/OR"  . Shortness of breath    exertional  . Type II diabetes mellitus ([place])    "had it years ago; lost weight after neck OR in Dec 03, 2005; glucose went way down; recently dx'd as returned recently" (12/07/2014)  . Urinary incontinence    "since 2007's OR"  . Venous insufficiency (chronic) (peripheral)     Past Surgical History:  Procedure Laterality Date  . ANTERIOR CERVICAL DECOMP/DISCECTOMY FUSION  2005-12-03  . BACK SURGERY    . CARDIAC CATHETERIZATION  09/10/2000   normal Coronary arteries, EF60%,   . CARDIAC PACEMAKER PLACEMENT  12/01/2007   St.Jude Zephyr Elta [place] [id number], dual chamber  . CARDIOVASCULAR STRESS TEST  08/28/2000   Cardiolite perfusion  study, EF58%, low risk study,   . DILATION AND CURETTAGE OF UTERUS  1960's  . KNEE ARTHROSCOPY Right   . LAPAROSCOPIC CHOLECYSTECTOMY  1995  . LEFT AND RIGHT HEART CATHETERIZATION WITH CORONARY ANGIOGRAM N/A 12/14/2014   Procedure: LEFT AND RIGHT HEART CATHETERIZATION WITH CORONARY ANGIOGRAM;  Surgeon: Peter M Martinique, MD;  Location: [facility] CATH LAB;  Service: Cardiovascular;  Laterality: N/A;  . Lower Ext. Dopplers  06/12/2011   no evidence of thrombus, all vessels  normal in size, no evidence of insuffiency  . LUMBAR LAMINECTOMY/DECOMPRESSION MICRODISCECTOMY  03/02/2012   Procedure: LUMBAR LAMINECTOMY/DECOMPRESSION MICRODISCECTOMY 2 LEVELS;  Surgeon: Floyce Stakes, MD;  Location: [facility];  Service: Neurosurgery;  Laterality: Bilateral;  Lumbar three, lumbar four, lumbar five Laminectomy/Foraminotomy  . NM MYOCAR PERF WALL MOTION  10/24/2008   protocol:Lexiscan, EF63%, normal study, scan negative for ischemia  . TRANSTHORACIC ECHOCARDIOGRAM  11/30/2007   normal echo, mean transaortic valve gradient 48mmHg    Current Medications: Outpatient Medications Prior to Visit  Medication Sig Dispense Refill  . acetaminophen (TYLENOL) 500 MG tablet Take 500 mg by mouth 4 (four) times daily -  with meals and at bedtime.    Marland Kitchen aspirin EC 81 MG tablet Take 81 mg by mouth daily.    . furosemide (LASIX) 40 MG tablet TAKE 1 TABLET (40 MG TOTAL) BY MOUTH DAILY. 30 tablet 11  . gabapentin (NEURONTIN) 300 MG capsule Take 600 mg by mouth 3 (three) times daily.     Marland Kitchen KLOR-CON M20 20 MEQ tablet TAKE 1 TABLET (20 MEQ TOTAL) BY MOUTH DAILY. 30 tablet 6  . naproxen sodium (ANAPROX) 220 MG tablet Take 220 mg by mouth 2 (two) times daily with a meal.    . OXYGEN 2lpm with sleep only  [facility]    . pravastatin (PRAVACHOL) 40 MG tablet Take 40 mg by mouth daily.    . psyllium (METAMUCIL) 58.6 % packet Take 1 packet by mouth daily.    . valsartan (DIOVAN) 80 MG tablet TAKE 1 TABLET (80 MG TOTAL) BY MOUTH DAILY. 30 tablet 11   No facility-administered medications prior to visit.      Allergies:   Review of patient's allergies indicates no known allergies.   Social History   Social History  . Marital status: Widowed    Spouse name: N/A  . Number of children: N/A  . Years of education: N/A   Occupational History  . Self employed- Academic librarian business     Social History Main Topics  . Smoking status: Former Smoker    Packs/day: 1.00    Years: 40.00    Types: Cigarettes     Quit date: 03/02/1992  . Smokeless tobacco: Never Used  . Alcohol use No  . Drug use: No  . Sexual activity: No   Other Topics Concern  . None   Social History Narrative  . None     Family History:  The patient's \  family history includes Diabetes in her father and mother; Heart attack in her father; Heart disease in her father and mother; Hyperlipidemia in her father and mother; Hypertension in her father and mother.   ROS:   Please see the history of present illness.    ROS All other systems reviewed and are negative.   PHYSICAL EXAM:   VS:  BP 110/66 (BP Location: Right Arm, Patient Position: Sitting, Cuff Size: Normal)   Pulse 80   Ht 5\' 7"  (1.702 m)   Wt 202 lb (91.6  kg)   BMI 31.64 kg/m    GEN: Well nourished, well developed, in no acute distress  HEENT: normal  Neck: no JVD, carotid bruits, or masses Cardiac: Paradoxically split S2 ,RRR; no murmurs, rubs, or gallops,no edema, healthy left subclavian pacemaker site  Respiratory:  Distant breath sounds, but otherwise clear to auscultation bilaterally, normal work of breathing GI: soft, nontender, nondistended, + BS MS: no deformity or atrophy  Skin: warm and dry, no rash Neuro:  Alert and Oriented x 3, Strength and sensation are intact Psych: euthymic mood, full affect  Wt Readings from Last 3 Encounters:  06/24/16 202 lb (91.6 kg)  01/03/16 203 lb 9.6 oz (92.4 kg)  10/29/15 199 lb 9.6 oz (90.5 kg)      Studies/Labs Reviewed:   EKG:  EKG is ordered today.  The ekg ordered today demonstrates AV sequential pacing, QTC 498 ms  Recent Labs: No results found for requested labs within last 8760 hours.   Lipid Panel    Component Value Date/Time   CHOL  11/30/2007 0510    158        ATP III CLASSIFICATION:  <200     mg/dL   Desirable  200-239  mg/dL   Borderline High  >=240    mg/dL   High   TRIG 174 (H) 11/30/2007 0510   HDL 43 11/30/2007 0510   CHOLHDL 3.7 11/30/2007 0510   VLDL 35 11/30/2007 0510    LDLCALC  11/30/2007 0510    80        Total Cholesterol/HDL:CHD Risk Coronary Heart Disease Risk Table                     Men   Women  1/2 Average Risk   3.4   3.3    Additional studies/ records that were reviewed today include:  Dr. Gustavus Bryant notes    ASSESSMENT:    1. CHB (complete heart block) ([place])   2. Chronic diastolic heart failure ([place])   3. Pacemaker   4. COPD GOLD II   5. Lumbar spine root compression      PLAN:  In order of problems listed above:  1. CHB: pacemaker dependent 2. CHF: Preserved LVEF. Appears euvolemic, very sedentary so hard to assess functional status. Activity is clearly limited by her orthopedic problems rather than her HF.  3. PPM: Her pacemaker is not amenable to remote monitoring. For the time being will continue in office checks every 6 months, but in a year or so may decide to increase the frequency to every 3 months 4. COPD:  Follows with Dr. Melvyn Novas, uses oxygen at night. 5. Severe lumbar spine disease: From a cardiac standpoint I think she would be able to tolerate spine surgery with a relatively low risk of complications, as long as we take precautions to avoid excessive intravenous fluid administration or wide swings in blood pressure. Would not expect electrocautery interference with her pacemaker with infradiaphragmatic levels of surgery.    Medication Adjustments/Labs and Tests Ordered: Current medicines are reviewed at length with the patient today.  Concerns regarding medicines are outlined above.  Medication changes, Labs and Tests ordered today are listed in the Patient Instructions below. Patient Instructions  Dr Sallyanne Kuster recommends that you schedule a follow-up appointment in 6 months with a device check. You will receive a reminder letter in the mail two months in advance. If you don't receive a letter, please call our office to schedule the follow-up appointment.  If  you need a refill on your cardiac medications before your next  appointment, please call your pharmacy.    Signed, Sanda Klein, MD  06/25/2016 2:54 PM    [facility] [place], [place], [place]  [address] Phone: [phone]; Fax: [phone]

## 2016-06-24 NOTE — Patient Instructions (Signed)
Dr [name] recommends that you schedule a follow-up appointment in 6 months with a device check. You will receive a reminder letter in the mail two months in advance. If you don't receive a letter, please call our office to schedule the follow-up appointment.  If you need a refill on your cardiac medications before your next appointment, please call your pharmacy. 

## 2016-07-02 DIAGNOSIS — M170 Bilateral primary osteoarthritis of knee: Secondary | ICD-10-CM | POA: Diagnosis not present

## 2016-07-02 DIAGNOSIS — M1711 Unilateral primary osteoarthritis, right knee: Secondary | ICD-10-CM | POA: Diagnosis not present

## 2016-07-02 DIAGNOSIS — M1712 Unilateral primary osteoarthritis, left knee: Secondary | ICD-10-CM | POA: Diagnosis not present

## 2016-08-13 DIAGNOSIS — Z23 Encounter for immunization: Secondary | ICD-10-CM | POA: Diagnosis not present

## 2016-09-02 DIAGNOSIS — M545 Low back pain: Secondary | ICD-10-CM | POA: Diagnosis not present

## 2016-09-02 DIAGNOSIS — M5416 Radiculopathy, lumbar region: Secondary | ICD-10-CM | POA: Diagnosis not present

## 2016-09-05 DIAGNOSIS — M5416 Radiculopathy, lumbar region: Secondary | ICD-10-CM | POA: Diagnosis not present

## 2016-09-09 DIAGNOSIS — M545 Low back pain: Secondary | ICD-10-CM | POA: Diagnosis not present

## 2016-09-09 DIAGNOSIS — M5416 Radiculopathy, lumbar region: Secondary | ICD-10-CM | POA: Diagnosis not present

## 2016-09-11 DIAGNOSIS — M5416 Radiculopathy, lumbar region: Secondary | ICD-10-CM | POA: Diagnosis not present

## 2016-09-11 DIAGNOSIS — M545 Low back pain: Secondary | ICD-10-CM | POA: Diagnosis not present

## 2016-09-16 DIAGNOSIS — M545 Low back pain: Secondary | ICD-10-CM | POA: Diagnosis not present

## 2016-09-16 DIAGNOSIS — M5416 Radiculopathy, lumbar region: Secondary | ICD-10-CM | POA: Diagnosis not present

## 2016-09-23 DIAGNOSIS — M5416 Radiculopathy, lumbar region: Secondary | ICD-10-CM | POA: Diagnosis not present

## 2016-09-23 DIAGNOSIS — M545 Low back pain: Secondary | ICD-10-CM | POA: Diagnosis not present

## 2016-09-25 DIAGNOSIS — M5416 Radiculopathy, lumbar region: Secondary | ICD-10-CM | POA: Diagnosis not present

## 2016-09-25 DIAGNOSIS — M545 Low back pain: Secondary | ICD-10-CM | POA: Diagnosis not present

## 2016-09-30 DIAGNOSIS — M5416 Radiculopathy, lumbar region: Secondary | ICD-10-CM | POA: Diagnosis not present

## 2016-09-30 DIAGNOSIS — M545 Low back pain: Secondary | ICD-10-CM | POA: Diagnosis not present

## 2016-10-02 DIAGNOSIS — M545 Low back pain: Secondary | ICD-10-CM | POA: Diagnosis not present

## 2016-10-02 DIAGNOSIS — M5416 Radiculopathy, lumbar region: Secondary | ICD-10-CM | POA: Diagnosis not present

## 2016-10-07 DIAGNOSIS — M545 Low back pain: Secondary | ICD-10-CM | POA: Diagnosis not present

## 2016-10-07 DIAGNOSIS — M5416 Radiculopathy, lumbar region: Secondary | ICD-10-CM | POA: Diagnosis not present

## 2016-10-09 DIAGNOSIS — M545 Low back pain: Secondary | ICD-10-CM | POA: Diagnosis not present

## 2016-10-09 DIAGNOSIS — M5416 Radiculopathy, lumbar region: Secondary | ICD-10-CM | POA: Diagnosis not present

## 2016-10-13 DIAGNOSIS — Z950 Presence of cardiac pacemaker: Secondary | ICD-10-CM | POA: Diagnosis not present

## 2016-10-13 DIAGNOSIS — I442 Atrioventricular block, complete: Secondary | ICD-10-CM | POA: Diagnosis not present

## 2016-10-13 DIAGNOSIS — E669 Obesity, unspecified: Secondary | ICD-10-CM | POA: Diagnosis not present

## 2016-10-13 DIAGNOSIS — I110 Hypertensive heart disease with heart failure: Secondary | ICD-10-CM | POA: Diagnosis not present

## 2016-10-13 DIAGNOSIS — E119 Type 2 diabetes mellitus without complications: Secondary | ICD-10-CM | POA: Diagnosis not present

## 2016-10-13 DIAGNOSIS — J449 Chronic obstructive pulmonary disease, unspecified: Secondary | ICD-10-CM | POA: Diagnosis not present

## 2016-10-13 DIAGNOSIS — I700 Atherosclerosis of aorta: Secondary | ICD-10-CM | POA: Diagnosis not present

## 2016-10-13 DIAGNOSIS — E7800 Pure hypercholesterolemia, unspecified: Secondary | ICD-10-CM | POA: Diagnosis not present

## 2016-10-14 DIAGNOSIS — M545 Low back pain: Secondary | ICD-10-CM | POA: Diagnosis not present

## 2016-10-14 DIAGNOSIS — M5416 Radiculopathy, lumbar region: Secondary | ICD-10-CM | POA: Diagnosis not present

## 2016-10-16 DIAGNOSIS — M5416 Radiculopathy, lumbar region: Secondary | ICD-10-CM | POA: Diagnosis not present

## 2016-10-16 DIAGNOSIS — M545 Low back pain: Secondary | ICD-10-CM | POA: Diagnosis not present

## 2016-10-21 DIAGNOSIS — M546 Pain in thoracic spine: Secondary | ICD-10-CM | POA: Diagnosis not present

## 2016-10-21 DIAGNOSIS — M5416 Radiculopathy, lumbar region: Secondary | ICD-10-CM | POA: Diagnosis not present

## 2016-10-23 DIAGNOSIS — M545 Low back pain: Secondary | ICD-10-CM | POA: Diagnosis not present

## 2016-10-23 DIAGNOSIS — M5416 Radiculopathy, lumbar region: Secondary | ICD-10-CM | POA: Diagnosis not present

## 2016-10-24 DIAGNOSIS — M5416 Radiculopathy, lumbar region: Secondary | ICD-10-CM | POA: Diagnosis not present

## 2016-10-30 DIAGNOSIS — M545 Low back pain: Secondary | ICD-10-CM | POA: Diagnosis not present

## 2016-10-30 DIAGNOSIS — M5416 Radiculopathy, lumbar region: Secondary | ICD-10-CM | POA: Diagnosis not present

## 2016-11-04 DIAGNOSIS — M545 Low back pain: Secondary | ICD-10-CM | POA: Diagnosis not present

## 2016-11-04 DIAGNOSIS — M5416 Radiculopathy, lumbar region: Secondary | ICD-10-CM | POA: Diagnosis not present

## 2016-11-06 DIAGNOSIS — M5416 Radiculopathy, lumbar region: Secondary | ICD-10-CM | POA: Diagnosis not present

## 2016-11-06 DIAGNOSIS — M545 Low back pain: Secondary | ICD-10-CM | POA: Diagnosis not present

## 2016-11-11 DIAGNOSIS — M5416 Radiculopathy, lumbar region: Secondary | ICD-10-CM | POA: Diagnosis not present

## 2016-11-11 DIAGNOSIS — M545 Low back pain: Secondary | ICD-10-CM | POA: Diagnosis not present

## 2016-11-13 DIAGNOSIS — M5416 Radiculopathy, lumbar region: Secondary | ICD-10-CM | POA: Diagnosis not present

## 2016-11-13 DIAGNOSIS — M545 Low back pain: Secondary | ICD-10-CM | POA: Diagnosis not present

## 2016-11-18 DIAGNOSIS — M545 Low back pain: Secondary | ICD-10-CM | POA: Diagnosis not present

## 2016-11-18 DIAGNOSIS — M5416 Radiculopathy, lumbar region: Secondary | ICD-10-CM | POA: Diagnosis not present

## 2016-11-20 DIAGNOSIS — M5416 Radiculopathy, lumbar region: Secondary | ICD-10-CM | POA: Diagnosis not present

## 2016-11-20 DIAGNOSIS — M545 Low back pain: Secondary | ICD-10-CM | POA: Diagnosis not present

## 2016-11-25 DIAGNOSIS — M545 Low back pain: Secondary | ICD-10-CM | POA: Diagnosis not present

## 2016-11-25 DIAGNOSIS — M5416 Radiculopathy, lumbar region: Secondary | ICD-10-CM | POA: Diagnosis not present

## 2016-12-02 DIAGNOSIS — M545 Low back pain: Secondary | ICD-10-CM | POA: Diagnosis not present

## 2016-12-02 DIAGNOSIS — M5416 Radiculopathy, lumbar region: Secondary | ICD-10-CM | POA: Diagnosis not present

## 2016-12-09 DIAGNOSIS — M5416 Radiculopathy, lumbar region: Secondary | ICD-10-CM | POA: Diagnosis not present

## 2016-12-09 DIAGNOSIS — M545 Low back pain: Secondary | ICD-10-CM | POA: Diagnosis not present

## 2016-12-16 DIAGNOSIS — M545 Low back pain: Secondary | ICD-10-CM | POA: Diagnosis not present

## 2016-12-16 DIAGNOSIS — M5416 Radiculopathy, lumbar region: Secondary | ICD-10-CM | POA: Diagnosis not present

## 2017-01-07 ENCOUNTER — Ambulatory Visit (INDEPENDENT_AMBULATORY_CARE_PROVIDER_SITE_OTHER): Payer: Medicare Other | Admitting: Cardiovascular Disease

## 2017-01-07 VITALS — BP 118/62 | HR 79 | Ht 67.0 in | Wt 200.6 lb

## 2017-01-07 DIAGNOSIS — I442 Atrioventricular block, complete: Secondary | ICD-10-CM | POA: Diagnosis not present

## 2017-01-07 DIAGNOSIS — J449 Chronic obstructive pulmonary disease, unspecified: Secondary | ICD-10-CM

## 2017-01-07 DIAGNOSIS — Z01810 Encounter for preprocedural cardiovascular examination: Secondary | ICD-10-CM

## 2017-01-07 DIAGNOSIS — I5032 Chronic diastolic (congestive) heart failure: Secondary | ICD-10-CM

## 2017-01-07 DIAGNOSIS — Z950 Presence of cardiac pacemaker: Secondary | ICD-10-CM

## 2017-01-07 NOTE — Progress Notes (Signed)
[name], female   DOB: 14-Nov-1940, 76 y.o.   MRN: [id number]    Cardiology Office Note    Date:  01/07/2017   ID:  [name], DOB 1940-11-03, MRN [id number]  PCP:  Gaynelle Arabian, MD  Cardiologist:   Sanda Klein, MD   Chief Complaint  Patient presents with  . Follow-up    History of Present Illness:  [name] is a 76 y.o. female with complete heart block/pacemaker dependent, St. Jude pacemaker implanted [id number], chronic diastolic heart failure, hypertension, COPD with chronic respiratory insufficiency on home oxygen at night only. She returns for a pacemaker follow-up.   She continues to have severe back pain related to scoliosis. She is reluctant to consider undergoing complicated two-stage/2 day back surgery with placement of rods., Recommended by Dr. Lynann Bologna.  The patient specifically denies any chest pain at rest or exertion, dyspnea at rest, orthopnea, paroxysmal nocturnal dyspnea, syncope, palpitations, focal neurological deficits, intermittent claudication, lower extremity edema, unexplained weight gain, cough, hemoptysis or wheezing. She has functional class II exertional dyspnea  Her St. Jude Zephyr XL device still has 1.5-2.5 years of estimated generator longevity. She has 36% atrial pacing and greater than >99% ventricular pacing. She has has not had any episodes of atrial fibrillation or mode switch.  She had minor coronary artery disease by previous angiography and has never had angina pectoris. She had acute HF exacerbation due to severe hypertension in the weeks after her husband passed away in 12-26-2014. At that time she underwent coronary angiography that showed very minor disease (30% stenosis at the takeoff of the first diagonal artery), normal pulmonary wedge pressure and mild pulmonary hypertension (38/16).  Past Medical History:  Diagnosis Date  . Arthritis    "hands, spine" (12/07/2014)  . CHB (complete heart block) ([facility]) 09/19/2013  . CHF  (congestive heart failure) ([facility]) dx'd 12/06/2014  . Chronic pain    "since neck OR in 12/25/05"  . Coronary artery disease    normal coronaries by 09/10/00 cath  . Diabetes mellitus    hga1c 7.1 no meds  . Dysrhythmia   . HTN (hypertension) 09/19/2013  . Hypercholesterolemia   . Hyperlipidemia 09/19/2013  . Hypertension   . Myocardial infarction dx'd December 26, 2007  . Neuropathy of both feet   . Pacemaker   . Paralysis of right hand ([facility]) 12-25-2005   "after spinal cord injury/OR"  . Shortness of breath    exertional  . Type II diabetes mellitus ([facility])    "had it years ago; lost weight after neck OR in 25-Dec-2005; glucose went way down; recently dx'd as returned recently" (12/07/2014)  . Urinary incontinence    "since 2007's OR"  . Venous insufficiency (chronic) (peripheral)     Past Surgical History:  Procedure Laterality Date  . ANTERIOR CERVICAL DECOMP/DISCECTOMY FUSION  12-25-2005  . BACK SURGERY    . CARDIAC CATHETERIZATION  09/10/2000   normal Coronary arteries, EF60%,   . CARDIAC PACEMAKER PLACEMENT  12/01/2007   St.Jude Zephyr Elta [place] #[id number], dual chamber  . CARDIOVASCULAR STRESS TEST  08/28/2000   Cardiolite perfusion study, EF58%, low risk study,   . DILATION AND CURETTAGE OF UTERUS  1960's  . KNEE ARTHROSCOPY Right   . LAPAROSCOPIC CHOLECYSTECTOMY  1995  . LEFT AND RIGHT HEART CATHETERIZATION WITH CORONARY ANGIOGRAM N/A 12/14/2014   Procedure: LEFT AND RIGHT HEART CATHETERIZATION WITH CORONARY ANGIOGRAM;  Surgeon: Peter M Martinique, MD;  Location: [facility] CATH LAB;  Service: Cardiovascular;  Laterality: N/A;  .  Lower Ext. Dopplers  06/12/2011   no evidence of thrombus, all vessels normal in size, no evidence of insuffiency  . LUMBAR LAMINECTOMY/DECOMPRESSION MICRODISCECTOMY  03/02/2012   Procedure: LUMBAR LAMINECTOMY/DECOMPRESSION MICRODISCECTOMY 2 LEVELS;  Surgeon: Floyce Stakes, MD;  Location: [facility] NEURO ORS;  Service: Neurosurgery;  Laterality: Bilateral;  Lumbar three, lumbar four, lumbar five  Laminectomy/Foraminotomy  . NM MYOCAR PERF WALL MOTION  10/24/2008   protocol:Lexiscan, EF63%, normal study, scan negative for ischemia  . TRANSTHORACIC ECHOCARDIOGRAM  11/30/2007   normal echo, mean transaortic valve gradient 53mmHg    Current Medications: Outpatient Medications Prior to Visit  Medication Sig Dispense Refill  . acetaminophen (TYLENOL) 500 MG tablet Take 500 mg by mouth 4 (four) times daily -  with meals and at bedtime.    Marland Kitchen aspirin EC 81 MG tablet Take 81 mg by mouth daily.    . furosemide (LASIX) 40 MG tablet TAKE 1 TABLET (40 MG TOTAL) BY MOUTH DAILY. 30 tablet 11  . gabapentin (NEURONTIN) 300 MG capsule Take 600 mg by mouth 3 (three) times daily.     Marland Kitchen KLOR-CON M20 20 MEQ tablet TAKE 1 TABLET (20 MEQ TOTAL) BY MOUTH DAILY. 30 tablet 6  . naproxen sodium (ANAPROX) 220 MG tablet Take 220 mg by mouth 2 (two) times daily with a meal.    . pravastatin (PRAVACHOL) 40 MG tablet Take 40 mg by mouth daily.    . psyllium (METAMUCIL) 58.6 % packet Take 1 packet by mouth daily.    . valsartan (DIOVAN) 80 MG tablet TAKE 1 TABLET (80 MG TOTAL) BY MOUTH DAILY. 30 tablet 11  . OXYGEN 2lpm with sleep only  [facility]     No facility-administered medications prior to visit.      Allergies:   Patient has no known allergies.   Social History   Social History  . Marital status: Widowed    Spouse name: N/A  . Number of children: N/A  . Years of education: N/A   Occupational History  . Self employed- Academic librarian business     Social History Main Topics  . Smoking status: Former Smoker    Packs/day: 1.00    Years: 40.00    Types: Cigarettes    Quit date: 03/02/1992  . Smokeless tobacco: Never Used  . Alcohol use No  . Drug use: No  . Sexual activity: No   Other Topics Concern  . Not on file   Social History Narrative  . No narrative on file     Family History:  The patient's \  family history includes Diabetes in her father and mother; Heart attack in her father; Heart disease in  her father and mother; Hyperlipidemia in her father and mother; Hypertension in her father and mother.   ROS:   Please see the history of present illness.    ROS All other systems reviewed and are negative.   PHYSICAL EXAM:   VS:  BP 118/62 (BP Location: Right Arm, Patient Position: Sitting, Cuff Size: Normal)   Pulse 79   Ht 5\' 7"  (1.702 m)   Wt 91 kg (200 lb 9.6 oz)   BMI 31.42 kg/m     General: Alert, oriented x3, no distress Head: no evidence of trauma, PERRL, EOMI, no exophtalmos or lid lag, no myxedema, no xanthelasma; normal ears, nose and oropharynx Neck: normal jugular venous pulsations and no hepatojugular reflux; brisk carotid pulses without delay and no carotid bruits Chest: clear to auscultation, no signs of consolidation by percussion  or palpation, normal fremitus, symmetrical and full respiratory excursions. Scoliosis Cardiovascular: normal position and quality of the apical impulse, regular rhythm, Paradoxically split S2 ,RRR; no murmurs, rubs, or gallops,no edema, healthy left subclavian pacemaker site Abdomen: no tenderness or distention, no masses by palpation, no abnormal pulsatility or arterial bruits, normal bowel sounds, no hepatosplenomegaly Extremities: no clubbing, cyanosis or edema; 2+ radial, ulnar and brachial pulses bilaterally; 2+ right femoral, posterior tibial and dorsalis pedis pulses; 2+ left femoral, posterior tibial and dorsalis pedis pulses; no subclavian or femoral bruits Neurological: grossly nonfocal   Wt Readings from Last 3 Encounters:  01/07/17 91 kg (200 lb 9.6 oz)  06/24/16 91.6 kg (202 lb)  01/03/16 92.4 kg (203 lb 9.6 oz)      Studies/Labs Reviewed:   EKG:  EKG is ordered today.  The ekg ordered today demonstrates AV sequential pacing, QTC 498 ms  Recent Labs: No results found for requested labs within last 8760 hours.   Lipid Panel    Component Value Date/Time   CHOL  11/30/2007 0510    158        ATP III CLASSIFICATION:   <200     mg/dL   Desirable  200-239  mg/dL   Borderline High  >=240    mg/dL   High   TRIG 174 (H) 11/30/2007 0510   HDL 43 11/30/2007 0510   CHOLHDL 3.7 11/30/2007 0510   VLDL 35 11/30/2007 0510   LDLCALC  11/30/2007 0510    80        Total Cholesterol/HDL:CHD Risk Coronary Heart Disease Risk Table                     Men   Women  1/2 Average Risk   3.4   3.3    ASSESSMENT:    1. Chronic diastolic heart failure ([facility])   2. CHB (complete heart block) ([facility])   3. Pacemaker   4. COPD GOLD II   5. Preoperative cardiovascular examination      PLAN:  In order of problems listed above:  1. CHB: pacemaker dependent. While back surgery is unlikely to interfere with normal pacemaker function, for safety I would recommend taping the pacemaker magnet over her device to avoid inadvertent device function inhibition due to electromagnetic interference. 2. CHF: Preserved LVEF. Appears euvolemic, very sedentary so hard to assess functional status. Activity level is limited by her knee and back problems, not by heart failure. 3. PPM: Older device not amenable to remote monitoring. Continue every six-month office checks, increase to every 3 months in the last anticipated year of device function 4. COPD:  Follows with Dr. Melvyn Novas, uses oxygen at night. 5. Severe lumbar spine disease: From a cardiac standpoint I think she would be able to tolerate spine surgery with a relatively low risk of complications, as long as we take precautions to avoid excessive intravenous fluid administration or wide swings in blood pressure. Would not expect electrocautery interference with her pacemaker with infradiaphragmatic levels of surgery, but would use a magnet over the device to avoid interference.    Medication Adjustments/Labs and Tests Ordered: Current medicines are reviewed at length with the patient today.  Concerns regarding medicines are outlined above.  Medication changes, Labs and Tests ordered today are  listed in the Patient Instructions below. There are no Patient Instructions on file for this visit.   Signed, Sanda Klein, MD  01/07/2017 11:22 AM    [facility] [address], [place], [place]  [address] Phone: [phone]; Fax: [phone]

## 2017-01-07 NOTE — Patient Instructions (Signed)
Dr [name] recommends that you schedule a follow-up appointment in 6 months with a pacemaker check. You will receive a reminder letter in the mail two months in advance. If you don't receive a letter, please call our office to schedule the follow-up appointment.  If you need a refill on your cardiac medications before your next appointment, please call your pharmacy. 

## 2017-01-08 ENCOUNTER — Encounter: Payer: Self-pay | Admitting: Cardiovascular Disease

## 2017-01-09 ENCOUNTER — Other Ambulatory Visit: Payer: Self-pay | Admitting: Cardiovascular Disease

## 2017-01-09 LAB — CUP PACEART INCLINIC DEVICE CHECK
Implantable Lead Implant Date: 20090401
Implantable Lead Implant Date: 20090401
Implantable Lead Location: 753860
Implantable Pulse Generator Implant Date: 20090401
Lead Channel Impedance Value: 525 Ohm
Lead Channel Pacing Threshold Amplitude: 0.5 V
Lead Channel Pacing Threshold Amplitude: 0.625 V
Lead Channel Pacing Threshold Pulse Width: 0.5 ms
Lead Channel Sensing Intrinsic Amplitude: 1.9 mV
Lead Channel Setting Sensing Sensitivity: 4 mV
MDC IDC LEAD LOCATION: 753859
MDC IDC MSMT BATTERY IMPEDANCE: 4600 Ohm
MDC IDC MSMT BATTERY VOLTAGE: 2.74 V
MDC IDC MSMT LEADCHNL RA IMPEDANCE VALUE: 465 Ohm
MDC IDC MSMT LEADCHNL RA PACING THRESHOLD PULSEWIDTH: 0.5 ms
MDC IDC SESS DTM: 20180509140952
MDC IDC SET LEADCHNL RA PACING AMPLITUDE: 2.5 V
MDC IDC SET LEADCHNL RV PACING PULSEWIDTH: 0.5 ms
Pulse Gen Serial Number: 1904939

## 2017-01-20 ENCOUNTER — Other Ambulatory Visit: Payer: Self-pay | Admitting: Internal Medicine

## 2017-02-13 ENCOUNTER — Other Ambulatory Visit: Payer: Self-pay | Admitting: Cardiovascular Disease

## 2017-02-16 NOTE — Telephone Encounter (Signed)
Rx(s) sent to pharmacy electronically.  

## 2017-02-24 ENCOUNTER — Other Ambulatory Visit: Payer: Self-pay | Admitting: Internal Medicine

## 2017-03-05 ENCOUNTER — Other Ambulatory Visit: Payer: Self-pay | Admitting: Internal Medicine

## 2017-03-12 ENCOUNTER — Other Ambulatory Visit: Payer: Self-pay | Admitting: Internal Medicine

## 2017-03-14 ENCOUNTER — Other Ambulatory Visit: Payer: Self-pay | Admitting: Internal Medicine

## 2017-04-14 DIAGNOSIS — J449 Chronic obstructive pulmonary disease, unspecified: Secondary | ICD-10-CM | POA: Diagnosis not present

## 2017-04-14 DIAGNOSIS — E7800 Pure hypercholesterolemia, unspecified: Secondary | ICD-10-CM | POA: Diagnosis not present

## 2017-04-14 DIAGNOSIS — M48061 Spinal stenosis, lumbar region without neurogenic claudication: Secondary | ICD-10-CM | POA: Diagnosis not present

## 2017-04-14 DIAGNOSIS — Z950 Presence of cardiac pacemaker: Secondary | ICD-10-CM | POA: Diagnosis not present

## 2017-04-14 DIAGNOSIS — E1165 Type 2 diabetes mellitus with hyperglycemia: Secondary | ICD-10-CM | POA: Diagnosis not present

## 2017-04-14 DIAGNOSIS — I110 Hypertensive heart disease with heart failure: Secondary | ICD-10-CM | POA: Diagnosis not present

## 2017-04-14 DIAGNOSIS — I272 Pulmonary hypertension, unspecified: Secondary | ICD-10-CM | POA: Diagnosis not present

## 2017-04-14 DIAGNOSIS — I700 Atherosclerosis of aorta: Secondary | ICD-10-CM | POA: Diagnosis not present

## 2017-04-14 DIAGNOSIS — R413 Other amnesia: Secondary | ICD-10-CM | POA: Diagnosis not present

## 2017-04-14 DIAGNOSIS — E669 Obesity, unspecified: Secondary | ICD-10-CM | POA: Diagnosis not present

## 2017-04-14 DIAGNOSIS — I442 Atrioventricular block, complete: Secondary | ICD-10-CM | POA: Diagnosis not present

## 2017-04-15 ENCOUNTER — Encounter: Payer: Self-pay | Admitting: Neurology

## 2017-06-08 DIAGNOSIS — Z23 Encounter for immunization: Secondary | ICD-10-CM | POA: Diagnosis not present

## 2017-06-19 ENCOUNTER — Ambulatory Visit: Payer: Medicare Other | Admitting: Neurology

## 2017-06-24 DIAGNOSIS — H2589 Other age-related cataract: Secondary | ICD-10-CM | POA: Diagnosis not present

## 2017-06-24 DIAGNOSIS — H26493 Other secondary cataract, bilateral: Secondary | ICD-10-CM | POA: Diagnosis not present

## 2017-06-24 DIAGNOSIS — Z961 Presence of intraocular lens: Secondary | ICD-10-CM | POA: Diagnosis not present

## 2017-06-24 DIAGNOSIS — H40012 Open angle with borderline findings, low risk, left eye: Secondary | ICD-10-CM | POA: Diagnosis not present

## 2017-07-01 DIAGNOSIS — Z961 Presence of intraocular lens: Secondary | ICD-10-CM | POA: Diagnosis not present

## 2017-07-01 DIAGNOSIS — H26491 Other secondary cataract, right eye: Secondary | ICD-10-CM | POA: Diagnosis not present

## 2017-10-16 DIAGNOSIS — E669 Obesity, unspecified: Secondary | ICD-10-CM | POA: Diagnosis not present

## 2017-10-16 DIAGNOSIS — I110 Hypertensive heart disease with heart failure: Secondary | ICD-10-CM | POA: Diagnosis not present

## 2017-10-16 DIAGNOSIS — I700 Atherosclerosis of aorta: Secondary | ICD-10-CM | POA: Diagnosis not present

## 2017-10-16 DIAGNOSIS — J449 Chronic obstructive pulmonary disease, unspecified: Secondary | ICD-10-CM | POA: Diagnosis not present

## 2017-10-16 DIAGNOSIS — Z950 Presence of cardiac pacemaker: Secondary | ICD-10-CM | POA: Diagnosis not present

## 2017-10-16 DIAGNOSIS — E119 Type 2 diabetes mellitus without complications: Secondary | ICD-10-CM | POA: Diagnosis not present

## 2017-10-16 DIAGNOSIS — E7800 Pure hypercholesterolemia, unspecified: Secondary | ICD-10-CM | POA: Diagnosis not present

## 2017-10-16 DIAGNOSIS — I442 Atrioventricular block, complete: Secondary | ICD-10-CM | POA: Diagnosis not present

## 2017-11-27 DIAGNOSIS — I110 Hypertensive heart disease with heart failure: Secondary | ICD-10-CM | POA: Diagnosis not present

## 2017-11-27 DIAGNOSIS — E119 Type 2 diabetes mellitus without complications: Secondary | ICD-10-CM | POA: Diagnosis not present

## 2017-11-27 DIAGNOSIS — J449 Chronic obstructive pulmonary disease, unspecified: Secondary | ICD-10-CM | POA: Diagnosis not present

## 2017-12-25 DIAGNOSIS — I110 Hypertensive heart disease with heart failure: Secondary | ICD-10-CM | POA: Diagnosis not present

## 2017-12-25 DIAGNOSIS — E119 Type 2 diabetes mellitus without complications: Secondary | ICD-10-CM | POA: Diagnosis not present

## 2017-12-25 DIAGNOSIS — J449 Chronic obstructive pulmonary disease, unspecified: Secondary | ICD-10-CM | POA: Diagnosis not present

## 2018-01-06 DIAGNOSIS — J449 Chronic obstructive pulmonary disease, unspecified: Secondary | ICD-10-CM | POA: Diagnosis not present

## 2018-01-06 DIAGNOSIS — E119 Type 2 diabetes mellitus without complications: Secondary | ICD-10-CM | POA: Diagnosis not present

## 2018-01-06 DIAGNOSIS — I110 Hypertensive heart disease with heart failure: Secondary | ICD-10-CM | POA: Diagnosis not present

## 2018-03-18 DIAGNOSIS — E119 Type 2 diabetes mellitus without complications: Secondary | ICD-10-CM | POA: Diagnosis not present

## 2018-03-18 DIAGNOSIS — J449 Chronic obstructive pulmonary disease, unspecified: Secondary | ICD-10-CM | POA: Diagnosis not present

## 2018-03-18 DIAGNOSIS — I110 Hypertensive heart disease with heart failure: Secondary | ICD-10-CM | POA: Diagnosis not present

## 2018-04-21 DIAGNOSIS — E119 Type 2 diabetes mellitus without complications: Secondary | ICD-10-CM | POA: Diagnosis not present

## 2018-04-21 DIAGNOSIS — I5030 Unspecified diastolic (congestive) heart failure: Secondary | ICD-10-CM | POA: Diagnosis not present

## 2018-04-21 DIAGNOSIS — E7800 Pure hypercholesterolemia, unspecified: Secondary | ICD-10-CM | POA: Diagnosis not present

## 2018-04-21 DIAGNOSIS — I442 Atrioventricular block, complete: Secondary | ICD-10-CM | POA: Diagnosis not present

## 2018-04-21 DIAGNOSIS — D692 Other nonthrombocytopenic purpura: Secondary | ICD-10-CM | POA: Diagnosis not present

## 2018-04-21 DIAGNOSIS — I110 Hypertensive heart disease with heart failure: Secondary | ICD-10-CM | POA: Diagnosis not present

## 2018-04-21 DIAGNOSIS — M48061 Spinal stenosis, lumbar region without neurogenic claudication: Secondary | ICD-10-CM | POA: Diagnosis not present

## 2018-04-21 DIAGNOSIS — Z950 Presence of cardiac pacemaker: Secondary | ICD-10-CM | POA: Diagnosis not present

## 2018-04-21 DIAGNOSIS — J449 Chronic obstructive pulmonary disease, unspecified: Secondary | ICD-10-CM | POA: Diagnosis not present

## 2018-04-21 DIAGNOSIS — I2721 Secondary pulmonary arterial hypertension: Secondary | ICD-10-CM | POA: Diagnosis not present

## 2018-04-21 DIAGNOSIS — I700 Atherosclerosis of aorta: Secondary | ICD-10-CM | POA: Diagnosis not present

## 2018-04-28 ENCOUNTER — Encounter: Payer: Medicare Other | Admitting: Cardiovascular Disease

## 2018-05-26 ENCOUNTER — Encounter (HOSPITAL_COMMUNITY)
Admission: RE | Disposition: A | Discharge status: Home / Self Care | Payer: Self-pay | Source: Ambulatory Visit | Attending: Cardiovascular Disease

## 2018-05-26 ENCOUNTER — Encounter (HOSPITAL_COMMUNITY): Payer: Self-pay | Admitting: *Deleted

## 2018-05-26 ENCOUNTER — Other Ambulatory Visit: Payer: Self-pay

## 2018-05-26 ENCOUNTER — Ambulatory Visit (INDEPENDENT_AMBULATORY_CARE_PROVIDER_SITE_OTHER): Payer: Medicare Other | Admitting: Cardiovascular Disease

## 2018-05-26 ENCOUNTER — Encounter: Payer: Self-pay | Admitting: Cardiovascular Disease

## 2018-05-26 ENCOUNTER — Ambulatory Visit (HOSPITAL_COMMUNITY)
Admission: RE | Admit: 2018-05-26 | Discharge: 2018-05-26 | Disposition: A | Discharge status: Home / Self Care | Payer: Medicare Other | Source: Ambulatory Visit | Attending: Cardiovascular Disease | Admitting: Cardiovascular Disease

## 2018-05-26 VITALS — BP 138/78 | HR 70 | Ht 67.0 in | Wt 200.8 lb

## 2018-05-26 DIAGNOSIS — I872 Venous insufficiency (chronic) (peripheral): Secondary | ICD-10-CM | POA: Insufficient documentation

## 2018-05-26 DIAGNOSIS — Z950 Presence of cardiac pacemaker: Secondary | ICD-10-CM | POA: Diagnosis not present

## 2018-05-26 DIAGNOSIS — E1140 Type 2 diabetes mellitus with diabetic neuropathy, unspecified: Secondary | ICD-10-CM | POA: Diagnosis not present

## 2018-05-26 DIAGNOSIS — Z8249 Family history of ischemic heart disease and other diseases of the circulatory system: Secondary | ICD-10-CM | POA: Diagnosis not present

## 2018-05-26 DIAGNOSIS — Z981 Arthrodesis status: Secondary | ICD-10-CM | POA: Diagnosis not present

## 2018-05-26 DIAGNOSIS — I252 Old myocardial infarction: Secondary | ICD-10-CM | POA: Diagnosis not present

## 2018-05-26 DIAGNOSIS — E7800 Pure hypercholesterolemia, unspecified: Secondary | ICD-10-CM | POA: Diagnosis not present

## 2018-05-26 DIAGNOSIS — Z45010 Encounter for checking and testing of cardiac pacemaker pulse generator [battery]: Secondary | ICD-10-CM

## 2018-05-26 DIAGNOSIS — Z833 Family history of diabetes mellitus: Secondary | ICD-10-CM | POA: Diagnosis not present

## 2018-05-26 DIAGNOSIS — I110 Hypertensive heart disease with heart failure: Secondary | ICD-10-CM | POA: Diagnosis not present

## 2018-05-26 DIAGNOSIS — M1990 Unspecified osteoarthritis, unspecified site: Secondary | ICD-10-CM | POA: Insufficient documentation

## 2018-05-26 DIAGNOSIS — I5032 Chronic diastolic (congestive) heart failure: Secondary | ICD-10-CM

## 2018-05-26 DIAGNOSIS — Z9049 Acquired absence of other specified parts of digestive tract: Secondary | ICD-10-CM | POA: Insufficient documentation

## 2018-05-26 DIAGNOSIS — I442 Atrioventricular block, complete: Secondary | ICD-10-CM

## 2018-05-26 DIAGNOSIS — M419 Scoliosis, unspecified: Secondary | ICD-10-CM | POA: Diagnosis not present

## 2018-05-26 DIAGNOSIS — Z955 Presence of coronary angioplasty implant and graft: Secondary | ICD-10-CM | POA: Insufficient documentation

## 2018-05-26 DIAGNOSIS — Z87891 Personal history of nicotine dependence: Secondary | ICD-10-CM | POA: Insufficient documentation

## 2018-05-26 DIAGNOSIS — Z9889 Other specified postprocedural states: Secondary | ICD-10-CM | POA: Insufficient documentation

## 2018-05-26 DIAGNOSIS — J449 Chronic obstructive pulmonary disease, unspecified: Secondary | ICD-10-CM | POA: Diagnosis not present

## 2018-05-26 DIAGNOSIS — I272 Pulmonary hypertension, unspecified: Secondary | ICD-10-CM | POA: Insufficient documentation

## 2018-05-26 DIAGNOSIS — I2510 Atherosclerotic heart disease of native coronary artery without angina pectoris: Secondary | ICD-10-CM | POA: Insufficient documentation

## 2018-05-26 DIAGNOSIS — Z79899 Other long term (current) drug therapy: Secondary | ICD-10-CM | POA: Diagnosis not present

## 2018-05-26 DIAGNOSIS — Z7982 Long term (current) use of aspirin: Secondary | ICD-10-CM | POA: Insufficient documentation

## 2018-05-26 DIAGNOSIS — Z9981 Dependence on supplemental oxygen: Secondary | ICD-10-CM | POA: Insufficient documentation

## 2018-05-26 HISTORY — PX: PPM GENERATOR CHANGEOUT: EP1233

## 2018-05-26 LAB — CBC
HCT: 43.9 % (ref 36.0–46.0)
HEMOGLOBIN: 14 g/dL (ref 12.0–15.0)
MCH: 29.9 pg (ref 26.0–34.0)
MCHC: 31.9 g/dL (ref 30.0–36.0)
MCV: 93.8 fL (ref 78.0–100.0)
PLATELETS: 163 10*3/uL (ref 150–400)
RBC: 4.68 MIL/uL (ref 3.87–5.11)
RDW: 12.2 % (ref 11.5–15.5)
WBC: 9.2 10*3/uL (ref 4.0–10.5)

## 2018-05-26 LAB — BASIC METABOLIC PANEL
ANION GAP: 13 (ref 5–15)
BUN: 19 mg/dL (ref 8–23)
CALCIUM: 9.3 mg/dL (ref 8.9–10.3)
CO2: 29 mmol/L (ref 22–32)
CREATININE: 0.72 mg/dL (ref 0.44–1.00)
Chloride: 100 mmol/L (ref 98–111)
Glucose, Bld: 154 mg/dL — ABNORMAL HIGH (ref 70–99)
Potassium: 4.5 mmol/L (ref 3.5–5.1)
SODIUM: 142 mmol/L (ref 135–145)

## 2018-05-26 LAB — SURGICAL PCR SCREEN
MRSA, PCR: NEGATIVE
Staphylococcus aureus: NEGATIVE

## 2018-05-26 LAB — PROTIME-INR
INR: 1
PROTHROMBIN TIME: 13.1 s (ref 11.4–15.2)

## 2018-05-26 SURGERY — PPM GENERATOR CHANGEOUT

## 2018-05-26 MED ORDER — SODIUM CHLORIDE 0.9 % IV SOLN
INTRAVENOUS | Status: AC
  Filled 2018-05-26: qty 2

## 2018-05-26 MED ORDER — ACETAMINOPHEN 325 MG PO TABS: 325.0000 mg | ORAL_TABLET | ORAL | Status: DC | PRN

## 2018-05-26 MED ORDER — SODIUM CHLORIDE 0.9 % IV SOLN: 250.0000 mL | INTRAVENOUS | Status: DC | PRN

## 2018-05-26 MED ORDER — SODIUM CHLORIDE 0.9% FLUSH: 3.0000 mL | INTRAVENOUS | Status: DC | PRN

## 2018-05-26 MED ORDER — CEFAZOLIN SODIUM-DEXTROSE 2-4 GM/100ML-% IV SOLN
2.0000 g | INTRAVENOUS | Status: AC
  Administered 2018-05-26: 2 g via INTRAVENOUS

## 2018-05-26 MED ORDER — SODIUM CHLORIDE 0.9 % IV SOLN
INTRAVENOUS | Status: DC
  Administered 2018-05-26: 12:00:00 via INTRAVENOUS

## 2018-05-26 MED ORDER — SODIUM CHLORIDE 0.9% FLUSH: 3.0000 mL | Freq: Two times a day (BID) | INTRAVENOUS | Status: DC

## 2018-05-26 MED ORDER — LIDOCAINE HCL (PF) 1 % IJ SOLN
INTRAMUSCULAR | Status: AC
  Filled 2018-05-26: qty 30

## 2018-05-26 MED ORDER — MUPIROCIN 2 % EX OINT
1.0000 "application " | TOPICAL_OINTMENT | Freq: Once | CUTANEOUS | Status: AC
  Administered 2018-05-26: 1 via TOPICAL

## 2018-05-26 MED ORDER — SODIUM CHLORIDE 0.9 % IV SOLN
80.0000 mg | INTRAVENOUS | Status: AC
  Administered 2018-05-26: 80 mg

## 2018-05-26 MED ORDER — CEFAZOLIN SODIUM-DEXTROSE 2-4 GM/100ML-% IV SOLN
INTRAVENOUS | Status: AC
  Filled 2018-05-26: qty 100

## 2018-05-26 MED ORDER — LIDOCAINE HCL (PF) 1 % IJ SOLN
INTRAMUSCULAR | Status: DC | PRN
  Administered 2018-05-26: 60 mL

## 2018-05-26 MED ORDER — ONDANSETRON HCL 4 MG/2ML IJ SOLN: 4.0000 mg | Freq: Four times a day (QID) | INTRAMUSCULAR | Status: DC | PRN

## 2018-05-26 MED ORDER — MUPIROCIN 2 % EX OINT
TOPICAL_OINTMENT | CUTANEOUS | Status: AC
  Administered 2018-05-26: 1 via TOPICAL
  Filled 2018-05-26: qty 22

## 2018-05-26 SURGICAL SUPPLY — 5 items
CABLE SURGICAL S-101-97-12 (CABLE) ×3 IMPLANT
PACEMAKER ASSURITY DR-RF (Pacemaker) ×2 IMPLANT
PAD PRO RADIOLUCENT 2001M-C (PAD) ×3 IMPLANT
POUCH AIGIS-R ANTIBACT ICD (Mesh General) ×3 IMPLANT
TRAY PACEMAKER INSERTION (PACKS) ×3 IMPLANT

## 2018-05-26 NOTE — Patient Instructions (Addendum)
  [facility] at [place], [place]  [place], [place] [address]  Phone: [phone] Fax: [phone]  You are scheduled for a Pacemaker [place] with Dr Sallyanne Kuster.  Please arrive at the [place] "A" of [facility]  1. Complete labwork at the hospital. 2. You may continue your current medications. 3. Plan for an overnight stay. 4. Bring your insurance cards and a list of your current medications.  * Special note:  Every effort is made to have your procedure done on time.  Occasionally there are emergencies that present themselves at the hospital that may cause delays.  Please be patient if a delay does occur.  If you have ANY questions after you get home, please call the office [phone].  Chelley, CMA Dr Sallyanne Kuster

## 2018-05-26 NOTE — Discharge Instructions (Signed)

## 2018-05-26 NOTE — Interval H&P Note (Signed)
History and Physical Interval Note:  05/26/2018 1:18 PM  [name]  has presented today for surgery, with the diagnosis of eri  The various methods of treatment have been discussed with the patient and family. After consideration of risks, benefits and other options for treatment, the patient has consented to  Procedure(s): PPM GENERATOR CHANGEOUT (N/A) as a surgical intervention .  The patient's history has been reviewed, patient examined, no change in status, stable for surgery.  I have reviewed the patient's chart and labs.  Questions were answered to the patient's satisfaction.     Gregery Walberg

## 2018-05-26 NOTE — H&P (View-Only) (Signed)
[name], female   DOB: 1941/08/03, 77 y.o.   MRN: [id number]    Cardiology Office Note    Date:  05/26/2018   ID:  FRANCENE MCERLEAN, DOB 01-24-1941, MRN [id number]  PCP:  Gaynelle Arabian, MD  Cardiologist:   Sanda Klein, MD   No chief complaint on file.   History of Present Illness:  [name] is a 77 y.o. female with complete heart block/pacemaker dependent, St. Jude pacemaker implanted 7408, chronic diastolic heart failure, hypertension, COPD with chronic respiratory insufficiency on home oxygen at night only. She returns for a pacemaker follow-up and her device has reached ERI.   At the last device check in May 2018 the estimated battery longevity was 1.5-2.5 years, but the [place] (implanted thinks that the largest not enough immediate response to my which is normal 12/14/07) reached ERI in February 2019, much faster than anticipated.  She was unaware of any change in her stamina (she has mostly negative driven atrial rhythm but requires 100% ventricular pacing).  Battery voltage has held reasonably well and today's voltage is 2.48 V (ERI 2.5 V) and she is still receiving appropriate pacing.  As much as could be checked today, lead parameters remain very good.  She has not experienced syncope.  She remains limited primarily by her scoliosis and walks slowly with a walker.  She has NYHA functional class I2 at -3 exertional dyspnea, unchanged.  She has not had problems with edema or required any diuretic dose adjustments.  She does not have palpitations but does feel occasional "ping" in her pacemaker area.  She denies angina pectoris.  She is wearing oxygen only at night for COPD.  She has not had any interim serious health problems.  She decided not to have back surgery.  She had minor coronary artery disease by previous angiography and has never had angina pectoris. She had acute HF exacerbation due to severe hypertension in the weeks after her husband  passed away in December 14, 2014. At that time she underwent coronary angiography that showed very minor disease (30% stenosis at the takeoff of the first diagonal artery), normal pulmonary wedge pressure and mild pulmonary hypertension (38/16).  Past Medical History:  Diagnosis Date  . Arthritis    "hands, spine" (12/07/2014)  . CHB (complete heart block) ([place]) 09/19/2013  . CHF (congestive heart failure) ([facility]) dx'd 12/06/2014  . Chronic pain    "since neck OR in 12/13/05"  . Coronary artery disease    normal coronaries by 09/10/00 cath  . Diabetes mellitus    hga1c 7.1 no meds  . Dysrhythmia   . HTN (hypertension) 09/19/2013  . Hypercholesterolemia   . Hyperlipidemia 09/19/2013  . Hypertension   . Myocardial infarction ([facility]) dx'd 12-14-2007  . Neuropathy of both feet   . Pacemaker   . Paralysis of right hand ([facility]) 2005-12-13   "after spinal cord injury/OR"  . Shortness of breath    exertional  . Type II diabetes mellitus ([facility])    "had it years ago; lost weight after neck OR in 12/13/2005; glucose went way down; recently dx'd as returned recently" (12/07/2014)  . Urinary incontinence    "since 2007's OR"  . Venous insufficiency (chronic) (peripheral)     Past Surgical History:  Procedure Laterality Date  . ANTERIOR CERVICAL DECOMP/DISCECTOMY FUSION  Dec 13, 2005  . BACK SURGERY    . CARDIAC CATHETERIZATION  09/10/2000   normal Coronary arteries, EF60%,   . CARDIAC PACEMAKER PLACEMENT  12/01/2007   St.Jude Zephyr Elta Gu[place] #[id number], dual chamber  . CARDIOVASCULAR STRESS TEST  08/28/2000   Cardiolite perfusion study, EF58%, low risk study,   . DILATION AND CURETTAGE OF UTERUS  1960's  . KNEE ARTHROSCOPY Right   . LAPAROSCOPIC CHOLECYSTECTOMY  1995  . LEFT AND RIGHT HEART CATHETERIZATION WITH CORONARY ANGIOGRAM N/A 12/14/2014   Procedure: LEFT AND RIGHT HEART CATHETERIZATION WITH CORONARY ANGIOGRAM;  Surgeon: Peter M Martinique, MD;  Location: [facility] CATH LAB;  Service: Cardiovascular;  Laterality: N/A;  . Lower Ext. Dopplers   06/12/2011   no evidence of thrombus, all vessels normal in size, no evidence of insuffiency  . LUMBAR LAMINECTOMY/DECOMPRESSION MICRODISCECTOMY  03/02/2012   Procedure: LUMBAR LAMINECTOMY/DECOMPRESSION MICRODISCECTOMY 2 LEVELS;  Surgeon: Floyce Stakes, MD;  Location: [facility] NEURO ORS;  Service: Neurosurgery;  Laterality: Bilateral;  Lumbar three, lumbar four, lumbar five Laminectomy/Foraminotomy  . NM MYOCAR PERF WALL MOTION  10/24/2008   protocol:Lexiscan, EF63%, normal study, scan negative for ischemia  . TRANSTHORACIC ECHOCARDIOGRAM  11/30/2007   normal echo, mean transaortic valve gradient 80mmHg    Current Medications: Outpatient Medications Prior to Visit  Medication Sig Dispense Refill  . acetaminophen (TYLENOL) 500 MG tablet Take 500 mg by mouth 4 (four) times daily -  with meals and at bedtime.    Marland Kitchen aspirin EC 81 MG tablet Take 81 mg by mouth daily.    . furosemide (LASIX) 40 MG tablet TAKE 1 TABLET (40 MG TOTAL) BY MOUTH DAILY. 30 tablet 10  . gabapentin (NEURONTIN) 300 MG capsule Take 600 mg by mouth 3 (three) times daily.     Marland Kitchen KLOR-CON M20 20 MEQ tablet TAKE 1 TABLET (20 MEQ TOTAL) BY MOUTH DAILY. 30 tablet 6  . losartan (COZAAR) 25 MG tablet Take 2 tablets by mouth daily. For blood pressure  3  . meloxicam (MOBIC) 15 MG tablet Take 1 tablet by mouth daily as needed. With food  4  . naproxen sodium (ANAPROX) 220 MG tablet Take 220 mg by mouth 2 (two) times daily with a meal.    . pravastatin (PRAVACHOL) 40 MG tablet Take 40 mg by mouth daily.    . psyllium (METAMUCIL) 58.6 % packet Take 1 packet by mouth daily as needed.     . valsartan (DIOVAN) 80 MG tablet TAKE 1 TABLET BY MOUTH EVERY DAY 30 tablet 5   No facility-administered medications prior to visit.      Allergies:   Patient has no known allergies.   Social History   Socioeconomic History  . Marital status: Widowed    Spouse name: Not on file  . Number of children: Not on file  . Years of education: Not on file    . Highest education level: Not on file  Occupational History  . Occupation: Self employed- Academic librarian business   Social Needs  . Financial resource strain: Not on file  . Food insecurity:    Worry: Not on file    Inability: Not on file  . Transportation needs:    Medical: Not on file    Non-medical: Not on file  Tobacco Use  . Smoking status: Former Smoker    Packs/day: 1.00    Years: 40.00    Pack years: 40.00    Types: Cigarettes    Last attempt to quit: 03/02/1992    Years since quitting: 26.2  . Smokeless tobacco: Never Used  Substance and Sexual Activity  . Alcohol use: No  . Drug use: No  . Sexual  activity: Never  Lifestyle  . Physical activity:    Days per week: Not on file    Minutes per session: Not on file  . Stress: Not on file  Relationships  . Social connections:    Talks on phone: Not on file    Gets together: Not on file    Attends religious service: Not on file    Active member of club or organization: Not on file    Attends meetings of clubs or organizations: Not on file    Relationship status: Not on file  Other Topics Concern  . Not on file  Social History Narrative  . Not on file     Family History:  The patient's \  family history includes Diabetes in her father and mother; Heart attack in her father; Heart disease in her father and mother; Hyperlipidemia in her father and mother; Hypertension in her father and mother.   ROS:   Please see the history of present illness.    ROS All other systems reviewed and are negative.   PHYSICAL EXAM:   VS:  BP 138/78 (BP Location: Left Arm, Patient Position: Sitting, Cuff Size: Large)   Pulse 70   Ht 5\' 7"  (1.702 m)   Wt 200 lb 12.8 oz (91.1 kg)   BMI 31.45 kg/m     General: Alert, oriented x3, no distress, healthy pacemaker scars;  Head: no evidence of trauma, PERRL, EOMI, no exophtalmos or lid lag, no myxedema, no xanthelasma; normal ears, nose and oropharynx Neck: normal jugular venous pulsations and  no hepatojugular reflux; brisk carotid pulses without delay and no carotid bruits Chest: clear to auscultation, no signs of consolidation by percussion or palpation, normal fremitus, symmetrical and full respiratory excursions Cardiovascular: normal position and quality of the apical impulse, regular rhythm, normal first and second heart sounds, no murmurs, rubs or gallops Abdomen: no tenderness or distention, no masses by palpation, no abnormal pulsatility or arterial bruits, normal bowel sounds, no hepatosplenomegaly Extremities: no clubbing, cyanosis or edema; 2+ radial, ulnar and brachial pulses bilaterally; 2+ right femoral, posterior tibial and dorsalis pedis pulses; 2+ left femoral, posterior tibial and dorsalis pedis pulses; no subclavian or femoral bruits Neurological: grossly nonfocal; scoliosis, walks slowly with walker Psych: Normal mood and affect   Wt Readings from Last 3 Encounters:  05/26/18 200 lb 12.8 oz (91.1 kg)  01/07/17 200 lb 9.6 oz (91 kg)  06/24/16 202 lb (91.6 kg)    Studies/Labs Reviewed:   EKG:  EKG is ordered today.  The ekg ordered today demonstrates A sensed (sinus) V paced rhythm (QRS 158 ms, QTc  477 ms).  Recent Labs: No results found for requested labs within last 8760 hours.   Lipid Panel    Component Value Date/Time   CHOL  11/30/2007 0510    158        ATP III CLASSIFICATION:  <200     mg/dL   Desirable  200-239  mg/dL   Borderline High  >=240    mg/dL   High   TRIG 174 (H) 11/30/2007 0510   HDL 43 11/30/2007 0510   CHOLHDL 3.7 11/30/2007 0510   VLDL 35 11/30/2007 0510   LDLCALC  11/30/2007 0510    80        Total Cholesterol/HDL:CHD Risk Coronary Heart Disease Risk Table                     Men   Women  1/2 Average Risk  3.4   3.3    ASSESSMENT:    1. CHB (complete heart block) ([facility])   2. Pacemaker battery depletion   3. Chronic diastolic heart failure ([place] [place])   4. COPD GOLD II    PLAN:  In order of problems listed  above:  1. CHB: pacemaker dependent.  2. PM@ERI : schedule for generator changeout today. This procedure has been fully reviewed with the patient and written informed consent has been obtained. 3. CHF: Preserved LVEF. Appears euvolemic, weight unchanged, very sedentary so hard to assess functional status. Activity level is limited by her knee and back problems. 4. COPD:  Follows with Dr. Melvyn Novas, uses oxygen at night.    Medication Adjustments/Labs and Tests Ordered: Current medicines are reviewed at length with the patient today.  Concerns regarding medicines are outlined above.  Medication changes, Labs and Tests ordered today are listed in the Patient Instructions below. Patient [facility] at [facility] [place], [place]  [place], [place] [address]  Phone: [phone] Fax: [phone]  You are scheduled for a Pacemaker [place] with Dr Sallyanne Kuster.  Please arrive at the [facility]" of [facility] ([place]  1. Complete labwork at the hospital. 2. You may continue your current medications. 3. Plan for an overnight stay. 4. Bring your insurance cards and a list of your current medications.  * Special note:  Every effort is made to have your procedure done on time.  Occasionally there are emergencies that present themselves at the hospital that may cause delays.  Please be patient if a delay does occur.  If you have ANY questions after you get home, please call the office [phone].  Enid Cutter, CMA Dr Sallyanne Kuster      Signed, Sanda Klein, MD  05/26/2018 10:11 AM    [place] [place], [place], [place]  [address] Phone: [phone]; Fax: [phone]

## 2018-05-26 NOTE — Progress Notes (Signed)
[name], female   DOB: March 21, 1941, 77 y.o.   MRN: [id number]    Cardiology Office Note    Date:  05/26/2018   ID:  TAM SAVOIA, DOB 02-23-1941, MRN [id number]  PCP:  Tammy Arabian, MD  Cardiologist:   Tammy Klein, MD   No chief complaint on file.   History of Present Illness:  [name] is a 77 y.o. female with complete heart block/pacemaker dependent, St. Jude pacemaker implanted 7353, chronic diastolic heart failure, hypertension, COPD with chronic respiratory insufficiency on home oxygen at night only. She returns for a pacemaker follow-up and her device has reached ERI.   At the last device check in May 2018 the estimated battery longevity was 1.5-2.5 years, but the [place] (implanted thinks that the largest not enough immediate response to my which is normal 2007-12-24) reached ERI in February 2019, much faster than anticipated.  She was unaware of any change in her stamina (she has mostly negative driven atrial rhythm but requires 100% ventricular pacing).  Battery voltage has held reasonably well and today's voltage is 2.48 V (ERI 2.5 V) and she is still receiving appropriate pacing.  As much as could be checked today, lead parameters remain very good.  She has not experienced syncope.  She remains limited primarily by her scoliosis and walks slowly with a walker.  She has NYHA functional class I2 at -3 exertional dyspnea, unchanged.  She has not had problems with edema or required any diuretic dose adjustments.  She does not have palpitations but does feel occasional "ping" in her pacemaker area.  She denies angina pectoris.  She is wearing oxygen only at night for COPD.  She has not had any interim serious health problems.  She decided not to have back surgery.  She had minor coronary artery disease by previous angiography and has never had angina pectoris. She had acute HF exacerbation due to severe hypertension in the weeks after her husband  passed away in 2014/12/24. At that time she underwent coronary angiography that showed very minor disease (30% stenosis at the takeoff of the first diagonal artery), normal pulmonary wedge pressure and mild pulmonary hypertension (38/16).  Past Medical History:  Diagnosis Date  . Arthritis    "hands, spine" (12/07/2014)  . CHB (complete heart block) ([place]) 09/19/2013  . CHF (congestive heart failure) ([place]) dx'd 12/06/2014  . Chronic pain    "since neck OR in 23-Dec-2005"  . Coronary artery disease    normal coronaries by 09/10/00 cath  . Diabetes mellitus    hga1c 7.1 no meds  . Dysrhythmia   . HTN (hypertension) 09/19/2013  . Hypercholesterolemia   . Hyperlipidemia 09/19/2013  . Hypertension   . Myocardial infarction ([place]) dx'd December 24, 2007  . Neuropathy of both feet   . Pacemaker   . Paralysis of right hand ([place]) 23-Dec-2005   "after spinal cord injury/OR"  . Shortness of breath    exertional  . Type II diabetes mellitus ([place])    "had it years ago; lost weight after neck OR in 12-23-05; glucose went way down; recently dx'd as returned recently" (12/07/2014)  . Urinary incontinence    "since 2007's OR"  . Venous insufficiency (chronic) (peripheral)     Past Surgical History:  Procedure Laterality Date  . ANTERIOR CERVICAL DECOMP/DISCECTOMY FUSION  December 23, 2005  . BACK SURGERY    . CARDIAC CATHETERIZATION  09/10/2000   normal Coronary arteries, EF60%,   . CARDIAC PACEMAKER PLACEMENT  12/01/2007   St.Jude Zephyr [place] #[id number], dual chamber  . CARDIOVASCULAR STRESS TEST  08/28/2000   Cardiolite perfusion study, EF58%, low risk study,   . DILATION AND CURETTAGE OF UTERUS  1960's  . KNEE ARTHROSCOPY Right   . LAPAROSCOPIC CHOLECYSTECTOMY  1995  . LEFT AND RIGHT HEART CATHETERIZATION WITH CORONARY ANGIOGRAM N/A 12/14/2014   Procedure: LEFT AND RIGHT HEART CATHETERIZATION WITH CORONARY ANGIOGRAM;  Surgeon: Tammy M Martinique, MD;  [facility] LAB;  Service: Cardiovascular;  Laterality: N/A;  . Lower Ext. Dopplers   06/12/2011   no evidence of thrombus, all vessels normal in size, no evidence of insuffiency  . LUMBAR LAMINECTOMY/DECOMPRESSION MICRODISCECTOMY  03/02/2012   Procedure: LUMBAR LAMINECTOMY/DECOMPRESSION MICRODISCECTOMY 2 LEVELS;  Surgeon: Floyce Stakes, MD;  Location: [facility] NEURO ORS;  Service: Neurosurgery;  Laterality: Bilateral;  Lumbar three, lumbar four, lumbar five Laminectomy/Foraminotomy  . NM MYOCAR PERF WALL MOTION  10/24/2008   protocol:Lexiscan, EF63%, normal study, scan negative for ischemia  . TRANSTHORACIC ECHOCARDIOGRAM  11/30/2007   normal echo, mean transaortic valve gradient 37mmHg    Current Medications: Outpatient Medications Prior to Visit  Medication Sig Dispense Refill  . acetaminophen (TYLENOL) 500 MG tablet Take 500 mg by mouth 4 (four) times daily -  with meals and at bedtime.    Marland Kitchen aspirin EC 81 MG tablet Take 81 mg by mouth daily.    . furosemide (LASIX) 40 MG tablet TAKE 1 TABLET (40 MG TOTAL) BY MOUTH DAILY. 30 tablet 10  . gabapentin (NEURONTIN) 300 MG capsule Take 600 mg by mouth 3 (three) times daily.     Marland Kitchen KLOR-CON M20 20 MEQ tablet TAKE 1 TABLET (20 MEQ TOTAL) BY MOUTH DAILY. 30 tablet 6  . losartan (COZAAR) 25 MG tablet Take 2 tablets by mouth daily. For blood pressure  3  . meloxicam (MOBIC) 15 MG tablet Take 1 tablet by mouth daily as needed. With food  4  . naproxen sodium (ANAPROX) 220 MG tablet Take 220 mg by mouth 2 (two) times daily with a meal.    . pravastatin (PRAVACHOL) 40 MG tablet Take 40 mg by mouth daily.    . psyllium (METAMUCIL) 58.6 % packet Take 1 packet by mouth daily as needed.     . valsartan (DIOVAN) 80 MG tablet TAKE 1 TABLET BY MOUTH EVERY DAY 30 tablet 5   No facility-administered medications prior to visit.      Allergies:   Patient has no known allergies.   Social History   Socioeconomic History  . Marital status: Widowed    Spouse name: Not on file  . Number of children: Not on file  . Years of education: Not on file    . Highest education level: Not on file  Occupational History  . Occupation: Self employed- Academic librarian business   Social Needs  . Financial resource strain: Not on file  . Food insecurity:    Worry: Not on file    Inability: Not on file  . Transportation needs:    Medical: Not on file    Non-medical: Not on file  Tobacco Use  . Smoking status: Former Smoker    Packs/day: 1.00    Years: 40.00    Pack years: 40.00    Types: Cigarettes    Last attempt to quit: 03/02/1992    Years since quitting: 26.2  . Smokeless tobacco: Never Used  Substance and Sexual Activity  . Alcohol use: No  . Drug use: No  . Sexual  activity: Never  Lifestyle  . Physical activity:    Days per week: Not on file    Minutes per session: Not on file  . Stress: Not on file  Relationships  . Social connections:    Talks on phone: Not on file    Gets together: Not on file    Attends religious service: Not on file    Active member of club or organization: Not on file    Attends meetings of clubs or organizations: Not on file    Relationship status: Not on file  Other Topics Concern  . Not on file  Social History Narrative  . Not on file     Family History:  The patient's \  family history includes Diabetes in her father and mother; Heart attack in her father; Heart disease in her father and mother; Hyperlipidemia in her father and mother; Hypertension in her father and mother.   ROS:   Please see the history of present illness.    ROS All other systems reviewed and are negative.   PHYSICAL EXAM:   VS:  BP 138/78 (BP Location: Left Arm, Patient Position: Sitting, Cuff Size: Large)   Pulse 70   Ht 5\' 7"  (1.702 m)   Wt 200 lb 12.8 oz (91.1 kg)   BMI 31.45 kg/m     General: Alert, oriented x3, no distress, healthy pacemaker scars;  Head: no evidence of trauma, PERRL, EOMI, no exophtalmos or lid lag, no myxedema, no xanthelasma; normal ears, nose and oropharynx Neck: normal jugular venous pulsations and  no hepatojugular reflux; brisk carotid pulses without delay and no carotid bruits Chest: clear to auscultation, no signs of consolidation by percussion or palpation, normal fremitus, symmetrical and full respiratory excursions Cardiovascular: normal position and quality of the apical impulse, regular rhythm, normal first and second heart sounds, no murmurs, rubs or gallops Abdomen: no tenderness or distention, no masses by palpation, no abnormal pulsatility or arterial bruits, normal bowel sounds, no hepatosplenomegaly Extremities: no clubbing, cyanosis or edema; 2+ radial, ulnar and brachial pulses bilaterally; 2+ right femoral, posterior tibial and dorsalis pedis pulses; 2+ left femoral, posterior tibial and dorsalis pedis pulses; no subclavian or femoral bruits Neurological: grossly nonfocal; scoliosis, walks slowly with walker Psych: Normal mood and affect   Wt Readings from Last 3 Encounters:  05/26/18 200 lb 12.8 oz (91.1 kg)  01/07/17 200 lb 9.6 oz (91 kg)  06/24/16 202 lb (91.6 kg)    Studies/Labs Reviewed:   EKG:  EKG is ordered today.  The ekg ordered today demonstrates A sensed (sinus) V paced rhythm (QRS 158 ms, QTc  477 ms).  Recent Labs: No results found for requested labs within last 8760 hours.   Lipid Panel    Component Value Date/Time   CHOL  11/30/2007 0510    158        ATP III CLASSIFICATION:  <200     mg/dL   Desirable  200-239  mg/dL   Borderline High  >=240    mg/dL   High   TRIG 174 (H) 11/30/2007 0510   HDL 43 11/30/2007 0510   CHOLHDL 3.7 11/30/2007 0510   VLDL 35 11/30/2007 0510   LDLCALC  11/30/2007 0510    80        Total Cholesterol/HDL:CHD Risk Coronary Heart Disease Risk Table                     Men   Women  1/2 Average Risk  3.4   3.3    ASSESSMENT:    1. CHB (complete heart block) ([place])   2. Pacemaker battery depletion   3. Chronic diastolic heart failure ([place])   4. COPD GOLD II    PLAN:  In order of problems listed  above:  1. CHB: pacemaker dependent.  2. PM@ERI : schedule for generator changeout today. This procedure has been fully reviewed with the patient and written informed consent has been obtained. 3. CHF: Preserved LVEF. Appears euvolemic, weight unchanged, very sedentary so hard to assess functional status. Activity level is limited by her knee and back problems. 4. COPD:  Follows with Dr. Melvyn Novas, uses oxygen at night.    Medication Adjustments/Labs and Tests Ordered: Current medicines are reviewed at length with the patient today.  Concerns regarding medicines are outlined above.  Medication changes, Labs and Tests ordered today are listed in the Patient Instructions below. [facility], [place]  [place], [place] [address]  Phone: [phone] Fax: [phone]  You are scheduled for a Pacemaker [place] with Dr Sallyanne Kuster.  Please arrive at the [facility]  1. Complete labwork at the hospital. 2. You may continue your current medications. 3. Plan for an overnight stay. 4. Bring your insurance cards and a list of your current medications.  * Special note:  Every effort is made to have your procedure done on time.  Occasionally there are emergencies that present themselves at the hospital that may cause delays.  Please be patient if a delay does occur.  If you have ANY questions after you get home, please call the office [phone].  Enid Cutter, CMA Dr Sallyanne Kuster      Signed, Tammy Klein, MD  05/26/2018 10:11 AM    [address], [place], [place]  [address] Phone: [phone]; Fax: [phone]

## 2018-05-26 NOTE — Op Note (Signed)
Procedure report  Procedure performed:  Dual chamber pacemaker generator changeout   Reason for procedure:  1. Device generator at elective replacement interval  2. Complete heart block Procedure performed by:  Sanda Klein, MD  Complications:  None  Estimated blood loss:  <5 mL  Medications administered during procedure:  Ancef 2 g intravenously, lidocaine 1% 30 mL locallyintravenously Device details:   [place] number [id number], serial number [id number] Right atrial lead (chronic) St. Jude [id number], serial [id number] (implanted 12/01/2007) Right ventricular lead (chronic)  St. Jude [id number], serial number [id number], (implanted 12/01/2007)  Explanted generator St. Jude [place],  model number 3491, serial number  [id number] (implanted 12/01/2007)  Procedure details:  After the risks and benefits of the procedure were discussed the patient provided informed consent. She was brought to the cardiac catheter lab in the fasting state. The patient was prepped and draped in usual sterile fashion. Local anesthesia with 1% lidocaine was administered to to the left infraclavicular area. A 5-6cm horizontal incision was made parallel with and 2-3 cm caudal to the left clavicle, in the area of an old scar. An older scar was seen closer to the left clavicle. Using minimal electrocautery and mostly sharp and blunt dissection the prepectoral pocket was opened carefully to avoid injury to the loops of chronic leads. Extensive dissection was not necessary. The device was explanted. The pocket was carefully inspected for hemostasis and flushed with copious amounts of antibiotic solution.  The leads were disconnected from the old generator and testing of the lead parameters later showed excellent values. The new generator was connected to the chronic leads, with appropriate pacing noted.   The entire system was then carefully inserted in the pocket with care been taking that the  leads and device assumed a comfortable position without pressure on the incision. Great care was taken that the leads be located deep to the generator. The pocket was then closed in layers using 2 layers of 2-0 Vicryl and cutaneous staples after which a sterile dressing was applied.   At the end of the procedure the following lead parameters were encountered:   Right atrial lead sensed P waves 3.5 mV, impedance 450 ohms, threshold 0.7V at 0.5 ms pulse width.  Right ventricular lead sensed R waves  None detected, impedance 440 ohms, threshold 1.0 at 0.5 ms pulse width.  Sanda Klein, MD, [facility] [phone] office [phone] pager

## 2018-05-27 ENCOUNTER — Encounter (HOSPITAL_COMMUNITY): Payer: Self-pay | Admitting: Cardiovascular Disease

## 2018-06-04 DIAGNOSIS — Z23 Encounter for immunization: Secondary | ICD-10-CM | POA: Diagnosis not present

## 2018-06-07 ENCOUNTER — Ambulatory Visit (INDEPENDENT_AMBULATORY_CARE_PROVIDER_SITE_OTHER): Payer: Medicare Other | Admitting: *Deleted

## 2018-06-07 DIAGNOSIS — I442 Atrioventricular block, complete: Secondary | ICD-10-CM

## 2018-06-07 LAB — CUP PACEART INCLINIC DEVICE CHECK
Battery Remaining Longevity: 127 mo
Battery Voltage: 3.11 V
Brady Statistic RA Percent Paced: 6.3 %
Date Time Interrogation Session: 20191007164341
Implantable Lead Implant Date: 20090401
Implantable Lead Location: 753859
Lead Channel Impedance Value: 400 Ohm
Lead Channel Impedance Value: 412.5 Ohm
Lead Channel Pacing Threshold Amplitude: 0.75 V
Lead Channel Pacing Threshold Amplitude: 0.75 V
Lead Channel Pacing Threshold Pulse Width: 0.5 ms
Lead Channel Pacing Threshold Pulse Width: 0.5 ms
Lead Channel Sensing Intrinsic Amplitude: 3 mV
Lead Channel Setting Pacing Amplitude: 0.875
Lead Channel Setting Sensing Sensitivity: 4 mV
MDC IDC LEAD IMPLANT DT: 20090401
MDC IDC LEAD LOCATION: 753860
MDC IDC MSMT LEADCHNL RA PACING THRESHOLD PULSEWIDTH: 0.5 ms
MDC IDC MSMT LEADCHNL RV PACING THRESHOLD AMPLITUDE: 0.75 V
MDC IDC MSMT LEADCHNL RV PACING THRESHOLD AMPLITUDE: 0.75 V
MDC IDC MSMT LEADCHNL RV PACING THRESHOLD PULSEWIDTH: 0.5 ms
MDC IDC PG IMPLANT DT: 20190925
MDC IDC PG SERIAL: 9064942
MDC IDC SET LEADCHNL RA PACING AMPLITUDE: 2 V
MDC IDC SET LEADCHNL RV PACING PULSEWIDTH: 0.5 ms
MDC IDC STAT BRADY RV PERCENT PACED: 99.77 %

## 2018-06-07 NOTE — Progress Notes (Signed)
Wound check appointment. Staples removed. Wound without redness or edema. Incision edges approximated, wound well healed. Normal device function. Thresholds, sensing, and impedances consistent with implant measurements. Device programmed at chronic output values. Histogram distribution appropriate for patient and level of activity. 1 AMS<10sec. No  high ventricular rates noted. Patient educated about wound care, arm mobility. ROV w/ [facility] 09/08/2018

## 2018-07-01 ENCOUNTER — Inpatient Hospital Stay (HOSPITAL_COMMUNITY)
Admission: EM | Admit: 2018-07-01 | Discharge: 2018-07-03 | DRG: 183 | Disposition: A | Discharge status: Home Health | Payer: Medicare Other | Attending: Internal Medicine | Admitting: Internal Medicine

## 2018-07-01 ENCOUNTER — Encounter (HOSPITAL_COMMUNITY): Payer: Self-pay

## 2018-07-01 ENCOUNTER — Other Ambulatory Visit: Payer: Self-pay

## 2018-07-01 ENCOUNTER — Emergency Department (HOSPITAL_COMMUNITY): Payer: Medicare Other

## 2018-07-01 DIAGNOSIS — I700 Atherosclerosis of aorta: Secondary | ICD-10-CM | POA: Diagnosis present

## 2018-07-01 DIAGNOSIS — Z9181 History of falling: Secondary | ICD-10-CM

## 2018-07-01 DIAGNOSIS — I2510 Atherosclerotic heart disease of native coronary artery without angina pectoris: Secondary | ICD-10-CM | POA: Diagnosis present

## 2018-07-01 DIAGNOSIS — I2721 Secondary pulmonary arterial hypertension: Secondary | ICD-10-CM | POA: Diagnosis present

## 2018-07-01 DIAGNOSIS — Z87891 Personal history of nicotine dependence: Secondary | ICD-10-CM

## 2018-07-01 DIAGNOSIS — S299XXA Unspecified injury of thorax, initial encounter: Secondary | ICD-10-CM | POA: Diagnosis not present

## 2018-07-01 DIAGNOSIS — E119 Type 2 diabetes mellitus without complications: Secondary | ICD-10-CM | POA: Diagnosis not present

## 2018-07-01 DIAGNOSIS — S2241XA Multiple fractures of ribs, right side, initial encounter for closed fracture: Secondary | ICD-10-CM | POA: Diagnosis present

## 2018-07-01 DIAGNOSIS — R079 Chest pain, unspecified: Secondary | ICD-10-CM | POA: Diagnosis not present

## 2018-07-01 DIAGNOSIS — R109 Unspecified abdominal pain: Secondary | ICD-10-CM | POA: Diagnosis not present

## 2018-07-01 DIAGNOSIS — I152 Hypertension secondary to endocrine disorders: Secondary | ICD-10-CM | POA: Diagnosis present

## 2018-07-01 DIAGNOSIS — S2239XA Fracture of one rib, unspecified side, initial encounter for closed fracture: Secondary | ICD-10-CM | POA: Diagnosis not present

## 2018-07-01 DIAGNOSIS — E1140 Type 2 diabetes mellitus with diabetic neuropathy, unspecified: Secondary | ICD-10-CM | POA: Diagnosis present

## 2018-07-01 DIAGNOSIS — S2249XD Multiple fractures of ribs, unspecified side, subsequent encounter for fracture with routine healing: Secondary | ICD-10-CM | POA: Diagnosis not present

## 2018-07-01 DIAGNOSIS — W1830XA Fall on same level, unspecified, initial encounter: Secondary | ICD-10-CM | POA: Diagnosis present

## 2018-07-01 DIAGNOSIS — E1165 Type 2 diabetes mellitus with hyperglycemia: Secondary | ICD-10-CM | POA: Diagnosis present

## 2018-07-01 DIAGNOSIS — I272 Pulmonary hypertension, unspecified: Secondary | ICD-10-CM | POA: Diagnosis present

## 2018-07-01 DIAGNOSIS — I252 Old myocardial infarction: Secondary | ICD-10-CM | POA: Diagnosis not present

## 2018-07-01 DIAGNOSIS — J441 Chronic obstructive pulmonary disease with (acute) exacerbation: Secondary | ICD-10-CM | POA: Diagnosis present

## 2018-07-01 DIAGNOSIS — Z950 Presence of cardiac pacemaker: Secondary | ICD-10-CM

## 2018-07-01 DIAGNOSIS — I110 Hypertensive heart disease with heart failure: Secondary | ICD-10-CM | POA: Diagnosis present

## 2018-07-01 DIAGNOSIS — G4733 Obstructive sleep apnea (adult) (pediatric): Secondary | ICD-10-CM | POA: Diagnosis present

## 2018-07-01 DIAGNOSIS — I872 Venous insufficiency (chronic) (peripheral): Secondary | ICD-10-CM | POA: Diagnosis present

## 2018-07-01 DIAGNOSIS — Z7982 Long term (current) use of aspirin: Secondary | ICD-10-CM | POA: Diagnosis not present

## 2018-07-01 DIAGNOSIS — R0602 Shortness of breath: Secondary | ICD-10-CM | POA: Diagnosis not present

## 2018-07-01 DIAGNOSIS — E785 Hyperlipidemia, unspecified: Secondary | ICD-10-CM | POA: Diagnosis present

## 2018-07-01 DIAGNOSIS — M792 Neuralgia and neuritis, unspecified: Secondary | ICD-10-CM | POA: Diagnosis present

## 2018-07-01 DIAGNOSIS — I10 Essential (primary) hypertension: Secondary | ICD-10-CM | POA: Diagnosis not present

## 2018-07-01 DIAGNOSIS — I517 Cardiomegaly: Secondary | ICD-10-CM | POA: Diagnosis not present

## 2018-07-01 DIAGNOSIS — I5032 Chronic diastolic (congestive) heart failure: Secondary | ICD-10-CM | POA: Diagnosis present

## 2018-07-01 DIAGNOSIS — Z79899 Other long term (current) drug therapy: Secondary | ICD-10-CM

## 2018-07-01 DIAGNOSIS — J9601 Acute respiratory failure with hypoxia: Secondary | ICD-10-CM | POA: Diagnosis present

## 2018-07-01 DIAGNOSIS — J9611 Chronic respiratory failure with hypoxia: Secondary | ICD-10-CM | POA: Diagnosis not present

## 2018-07-01 DIAGNOSIS — S2249XA Multiple fractures of ribs, unspecified side, initial encounter for closed fracture: Secondary | ICD-10-CM

## 2018-07-01 LAB — BLOOD GAS, ARTERIAL
Acid-Base Excess: 7.8 mmol/L — ABNORMAL HIGH (ref 0.0–2.0)
Bicarbonate: 32.4 mmol/L — ABNORMAL HIGH (ref 20.0–28.0)
Drawn by: 129711
O2 Content: 2 L/min
O2 SAT: 94.7 %
PATIENT TEMPERATURE: 98.6
PCO2 ART: 51.4 mmHg — AB (ref 32.0–48.0)
pH, Arterial: 7.416 (ref 7.350–7.450)
pO2, Arterial: 71.4 mmHg — ABNORMAL LOW (ref 83.0–108.0)

## 2018-07-01 LAB — URINALYSIS, ROUTINE W REFLEX MICROSCOPIC
Bilirubin Urine: NEGATIVE
Glucose, UA: NEGATIVE mg/dL
HGB URINE DIPSTICK: NEGATIVE
Ketones, ur: 20 mg/dL — AB
LEUKOCYTES UA: NEGATIVE
NITRITE: NEGATIVE
PROTEIN: NEGATIVE mg/dL
Specific Gravity, Urine: >1.046 — ABNORMAL HIGH (ref 1.005–1.030)
pH: 9 — ABNORMAL HIGH (ref 5.0–8.0)

## 2018-07-01 LAB — BASIC METABOLIC PANEL
ANION GAP: 5 (ref 5–15)
BUN: 18 mg/dL (ref 8–23)
CHLORIDE: 100 mmol/L (ref 98–111)
CO2: 31 mmol/L (ref 22–32)
Calcium: 9.1 mg/dL (ref 8.9–10.3)
Creatinine, Ser: 0.63 mg/dL (ref 0.44–1.00)
GFR calc non Af Amer: >60 mL/min (ref >60)
GLUCOSE: 145 mg/dL — AB (ref 70–99)
Potassium: 4.6 mmol/L (ref 3.5–5.1)
Sodium: 136 mmol/L (ref 135–145)

## 2018-07-01 LAB — CBC WITH DIFFERENTIAL/PLATELET
Abs Immature Granulocytes: 0.02 10*3/uL (ref 0.00–0.07)
Basophils Absolute: 0 10*3/uL (ref 0.0–0.1)
Basophils Relative: 0 %
EOS ABS: 0 10*3/uL (ref 0.0–0.5)
EOS PCT: 1 %
HEMATOCRIT: 45.6 % (ref 36.0–46.0)
HEMOGLOBIN: 13.9 g/dL (ref 12.0–15.0)
IMMATURE GRANULOCYTES: 0 %
LYMPHS ABS: 1.9 10*3/uL (ref 0.7–4.0)
LYMPHS PCT: 24 %
MCH: 28.5 pg (ref 26.0–34.0)
MCHC: 30.5 g/dL (ref 30.0–36.0)
MCV: 93.6 fL (ref 80.0–100.0)
MONOS PCT: 6 %
Monocytes Absolute: 0.5 10*3/uL (ref 0.1–1.0)
Neutro Abs: 5.3 10*3/uL (ref 1.7–7.7)
Neutrophils Relative %: 69 %
Platelets: 184 10*3/uL (ref 150–400)
RBC: 4.87 MIL/uL (ref 3.87–5.11)
RDW: 12.8 % (ref 11.5–15.5)
WBC: 7.8 10*3/uL (ref 4.0–10.5)
nRBC: 0 % (ref 0.0–0.2)

## 2018-07-01 LAB — PROCALCITONIN

## 2018-07-01 LAB — LACTIC ACID, PLASMA: Lactic Acid, Venous: 1.2 mmol/L (ref 0.5–1.9)

## 2018-07-01 LAB — MAGNESIUM: Magnesium: 2.1 mg/dL (ref 1.7–2.4)

## 2018-07-01 LAB — I-STAT TROPONIN, ED: Troponin i, poc: 0 ng/mL (ref 0.00–0.08)

## 2018-07-01 MED ORDER — HYDROMORPHONE HCL 1 MG/ML IJ SOLN: 0.5000 mg | INTRAMUSCULAR | Status: DC | PRN

## 2018-07-01 MED ORDER — IPRATROPIUM BROMIDE 0.02 % IN SOLN
0.5000 mg | Freq: Once | RESPIRATORY_TRACT | Status: AC
  Administered 2018-07-01: 0.5 mg via RESPIRATORY_TRACT
  Filled 2018-07-01: qty 2.5

## 2018-07-01 MED ORDER — ONDANSETRON HCL 4 MG/2ML IJ SOLN
4.0000 mg | Freq: Four times a day (QID) | INTRAMUSCULAR | Status: DC | PRN
  Administered 2018-07-01: 4 mg via INTRAVENOUS
  Filled 2018-07-01: qty 2

## 2018-07-01 MED ORDER — GABAPENTIN 300 MG PO CAPS: 900.0000 mg | ORAL_CAPSULE | Freq: Four times a day (QID) | ORAL | Status: DC

## 2018-07-01 MED ORDER — POLYETHYLENE GLYCOL 3350 17 G PO PACK
17.0000 g | PACK | Freq: Every day | ORAL | Status: DC
  Administered 2018-07-02 – 2018-07-03 (×2): 17 g via ORAL
  Filled 2018-07-01 (×2): qty 1

## 2018-07-01 MED ORDER — IPRATROPIUM-ALBUTEROL 0.5-2.5 (3) MG/3ML IN SOLN
3.0000 mL | Freq: Four times a day (QID) | RESPIRATORY_TRACT | Status: DC
  Administered 2018-07-01: 3 mL via RESPIRATORY_TRACT
  Filled 2018-07-01: qty 3

## 2018-07-01 MED ORDER — MAGNESIUM SULFATE 2 GM/50ML IV SOLN: 2.0000 g | Freq: Once | INTRAVENOUS | Status: DC

## 2018-07-01 MED ORDER — HYDROMORPHONE HCL 1 MG/ML IJ SOLN
0.5000 mg | INTRAMUSCULAR | Status: DC | PRN
  Administered 2018-07-01: 0.5 mg via INTRAVENOUS
  Filled 2018-07-01: qty 1

## 2018-07-01 MED ORDER — ASPIRIN EC 81 MG PO TBEC
81.0000 mg | DELAYED_RELEASE_TABLET | Freq: Every day | ORAL | Status: DC
  Administered 2018-07-01 – 2018-07-03 (×3): 81 mg via ORAL
  Filled 2018-07-01 (×3): qty 1

## 2018-07-01 MED ORDER — IOHEXOL 300 MG/ML  SOLN
75.0000 mL | Freq: Once | INTRAMUSCULAR | Status: AC | PRN
  Administered 2018-07-01: 75 mL via INTRAVENOUS

## 2018-07-01 MED ORDER — GABAPENTIN 300 MG PO CAPS
300.0000 mg | ORAL_CAPSULE | Freq: Two times a day (BID) | ORAL | Status: DC
  Administered 2018-07-01: 300 mg via ORAL
  Filled 2018-07-01: qty 1

## 2018-07-01 MED ORDER — ALBUTEROL SULFATE (2.5 MG/3ML) 0.083% IN NEBU
5.0000 mg | INHALATION_SOLUTION | Freq: Once | RESPIRATORY_TRACT | Status: AC
  Administered 2018-07-01: 5 mg via RESPIRATORY_TRACT
  Filled 2018-07-01: qty 6

## 2018-07-01 MED ORDER — METHYLPREDNISOLONE SODIUM SUCC 125 MG IJ SOLR
125.0000 mg | Freq: Once | INTRAMUSCULAR | Status: AC
  Administered 2018-07-01: 125 mg via INTRAVENOUS
  Filled 2018-07-01: qty 2

## 2018-07-01 MED ORDER — SODIUM CHLORIDE 0.9 % IV SOLN
INTRAVENOUS | Status: DC
  Administered 2018-07-01: 21:00:00 via INTRAVENOUS

## 2018-07-01 MED ORDER — ACETAMINOPHEN 500 MG PO TABS
500.0000 mg | ORAL_TABLET | Freq: Three times a day (TID) | ORAL | Status: DC
  Administered 2018-07-01 – 2018-07-03 (×7): 500 mg via ORAL
  Filled 2018-07-01 (×7): qty 1

## 2018-07-01 MED ORDER — OXYCODONE HCL 5 MG PO TABS
5.0000 mg | ORAL_TABLET | Freq: Four times a day (QID) | ORAL | Status: DC | PRN
  Administered 2018-07-02 – 2018-07-03 (×4): 5 mg via ORAL
  Filled 2018-07-01 (×4): qty 1

## 2018-07-01 MED ORDER — HYDRALAZINE HCL 20 MG/ML IJ SOLN
10.0000 mg | Freq: Four times a day (QID) | INTRAMUSCULAR | Status: DC | PRN
  Administered 2018-07-01: 10 mg via INTRAVENOUS
  Filled 2018-07-01: qty 1

## 2018-07-01 MED ORDER — POTASSIUM CHLORIDE CRYS ER 20 MEQ PO TBCR: 20.0000 meq | EXTENDED_RELEASE_TABLET | Freq: Every day | ORAL | Status: DC

## 2018-07-01 MED ORDER — SENNOSIDES-DOCUSATE SODIUM 8.6-50 MG PO TABS
2.0000 | ORAL_TABLET | Freq: Two times a day (BID) | ORAL | Status: DC
  Administered 2018-07-01 – 2018-07-03 (×4): 2 via ORAL
  Filled 2018-07-01 (×4): qty 2

## 2018-07-01 MED ORDER — OXYCODONE HCL ER 10 MG PO T12A
10.0000 mg | EXTENDED_RELEASE_TABLET | Freq: Two times a day (BID) | ORAL | Status: DC
  Administered 2018-07-01: 10 mg via ORAL
  Filled 2018-07-01: qty 1

## 2018-07-01 MED ORDER — TRAMADOL HCL 50 MG PO TABS
50.0000 mg | ORAL_TABLET | Freq: Once | ORAL | Status: AC
  Administered 2018-07-01: 50 mg via ORAL
  Filled 2018-07-01: qty 1

## 2018-07-01 MED ORDER — FUROSEMIDE 20 MG PO TABS
40.0000 mg | ORAL_TABLET | Freq: Every day | ORAL | Status: DC
  Administered 2018-07-01: 40 mg via ORAL
  Filled 2018-07-01: qty 2

## 2018-07-01 MED ORDER — LOSARTAN POTASSIUM 50 MG PO TABS
50.0000 mg | ORAL_TABLET | Freq: Every day | ORAL | Status: DC
  Administered 2018-07-02 – 2018-07-03 (×2): 50 mg via ORAL
  Filled 2018-07-01 (×2): qty 1

## 2018-07-01 NOTE — ED Provider Notes (Addendum)
[facility] EMERGENCY DEPARTMENT Provider Note   CSN: [id number] Arrival date & time: 07/01/18  1326     History   Chief Complaint Chief Complaint  Patient presents with  . Pneumonia  . Fall    HPI [name] is a 77 y.o. female with a h/o of complete heart block s/o pacemaker, COPD gold II, CHF, HLD, OSA, CAD, diabetic neuropathy, and DM who presents to the emergency department from UC with a chief complaint of fall.  The patient endorses a mechanical fall yesterday onto her right side.  She ambulates with a walker at baseline and was not using the walker when she attempted to walk a distance less than a foot without assistance. States she fell against a large painting.  She denies hitting her head, LOC, nausea, or emesis.  She endorses severe, constant right rib pain that began after the fall.  Pain is worse with taking deep breaths and laying on her right side. Her daughter reports that she has had previous falls after her knees get weak and give out secondary to her diabetic neuropathy.  The patient's daughter reports they had lunch about 5 to 6 days ago and the patient had dyspnea with exertion, although the patient continues to deny dyspnea here in the ED.  She denies cough, fever, chills, nasal congestion, URI symptoms, abdominal pain, nausea, vomiting, diarrhea, lightheadedness, or dizziness.  When she was seen at urgent care earlier today, she was noted to be satting at 81% on room air.  An x-ray performed at urgent care demonstrated pneumonia, but they were unable to fully assess the right ribs due to the infiltrate.  States she does not wear home O2.  She is a former smoker and quit in 1993.    The history is provided by the patient and a relative. No language interpreter was used.  Fall  Associated symptoms include chest pain and shortness of breath. Pertinent negatives include no abdominal pain and no headaches.    Past Medical History:  Diagnosis  Date  . Arthritis    "hands, spine" (12/07/2014)  . CHB (complete heart block) ([place]) 09/19/2013  . CHF (congestive heart failure) ([place]) dx'd 12/06/2014  . Chronic pain    "since neck OR in 2007"  . Coronary artery disease    normal coronaries by 09/10/00 cath  . Diabetes mellitus    hga1c 7.1 no meds  . Dysrhythmia   . HTN (hypertension) 09/19/2013  . Hypercholesterolemia   . Hyperlipidemia 09/19/2013  . Hypertension   . Myocardial infarction ([place]) dx'd 2009  . Neuropathy of both feet   . Pacemaker   . 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 Shortness of breath    exertional  . Type II diabetes mellitus ([place])    "had it years ago; lost weight after neck OR in 2007; glucose went way down; recently dx'd as returned recently" (12/07/2014)  . Urinary incontinence    "since 2007's OR"  . Venous insufficiency (chronic) (peripheral)     Patient Active Problem List   Diagnosis Date Noted  . Pacemaker battery depletion 05/26/2018  . Mild obesity 01/05/2016  . Chronic respiratory failure with hypoxia ([facility] [facility]) 10/29/2015  . Morbid obesity ([place]) 10/29/2015  . Upper airway cough syndrome 03/04/2015  . COPD GOLD II 01/18/2015  . Pulmonary hypertension ([place])   . Dyspnea   . SOB (shortness of breath)   . Sleep apnea   . Hypoxemia   .  Acute respiratory failure with hypoxemia ([place])   . Acute pulmonary edema ([facility])   . Chronic diastolic heart failure ([place]) 12/06/2014  . Accelerated hypertension with diastolic congestive heart failure, NYHA class 3 ([place]) 12/06/2014  . CHB (complete heart block) ([place]) 09/19/2013  . Pacemaker - dual chamber St Jude, 2009 09/19/2013  . Essential hypertension 09/19/2013  . Normal coronary arteries angiography 2002 09/19/2013  . Hyperlipidemia 09/19/2013  . Varicose veins of lower extremities with other complications 06/17/5101    Past Surgical History:  Procedure Laterality Date  . ANTERIOR CERVICAL DECOMP/DISCECTOMY FUSION  2007  .  BACK SURGERY    . CARDIAC CATHETERIZATION  09/10/2000   normal Coronary arteries, EF60%,   . CARDIAC PACEMAKER PLACEMENT  12/01/2007   [employer]ude Zephyr Elta [place] #[id number], dual chamber  . CARDIOVASCULAR STRESS TEST  08/28/2000   Cardiolite perfusion study, EF58%, low risk study,   . DILATION AND CURETTAGE OF UTERUS  1960's  . KNEE ARTHROSCOPY Right   . LAPAROSCOPIC CHOLECYSTECTOMY  1995  . LEFT AND RIGHT HEART CATHETERIZATION WITH CORONARY ANGIOGRAM N/A 12/14/2014   Procedure: LEFT AND RIGHT HEART CATHETERIZATION WITH CORONARY ANGIOGRAM;  Surgeon: Peter M Martinique, MD;  Location: [facility] CATH LAB;  Service: Cardiovascular;  Laterality: N/A;  . Lower Ext. Dopplers  06/12/2011   no evidence of thrombus, all vessels normal in size, no evidence of insuffiency  . LUMBAR LAMINECTOMY/DECOMPRESSION MICRODISCECTOMY  03/02/2012   Procedure: LUMBAR LAMINECTOMY/DECOMPRESSION MICRODISCECTOMY 2 LEVELS;  Surgeon: Floyce Stakes, MD;  Location: [facility];  Service: Neurosurgery;  Laterality: Bilateral;  Lumbar three, lumbar four, lumbar five Laminectomy/Foraminotomy  . NM MYOCAR PERF WALL MOTION  10/24/2008   protocol:Lexiscan, EF63%, normal study, scan negative for ischemia  . PPM GENERATOR CHANGEOUT N/A 05/26/2018   Procedure: PPM GENERATOR CHANGEOUT;  Surgeon: Sanda Klein, MD;  Location: [facility] CV LAB;  Service: Cardiovascular;  Laterality: N/A;  . TRANSTHORACIC ECHOCARDIOGRAM  11/30/2007   normal echo, mean transaortic valve gradient 64mmHg     OB History   None      Home Medications    Prior to Admission medications   Medication Sig Start Date End Date Taking? Authorizing Provider  acetaminophen (TYLENOL) 500 MG tablet Take 500 mg by mouth 4 (four) times daily -  with meals and at bedtime.   Yes [provider]  aspirin EC 81 MG tablet Take 81 mg by mouth daily.   Yes [provider]  furosemide (LASIX) 40 MG tablet TAKE 1 TABLET (40 MG TOTAL) BY MOUTH DAILY. Patient taking  differently: Take 40 mg by mouth daily.  02/16/17  Yes [name], Mihai, MD  gabapentin (NEURONTIN) 300 MG capsule Take 900 mg by mouth 4 (four) times daily.    Yes [provider]  KLOR-CON M20 20 MEQ tablet TAKE 1 TABLET (20 MEQ TOTAL) BY MOUTH DAILY. 06/03/16  Yes [name], Mihai, MD  losartan (COZAAR) 25 MG tablet Take 2 tablets by mouth daily. For blood pressure 05/21/18  Yes [provider]  pravastatin (PRAVACHOL) 40 MG tablet Take 40 mg by mouth daily.   Yes [provider]    Family History Family History  Problem Relation Age of Onset  . Diabetes Father   . Heart disease Father        before age 36  . Hyperlipidemia Father   . Hypertension Father   . Heart attack Father   . Diabetes Mother   . Heart disease Mother   . Hyperlipidemia Mother   .  Hypertension Mother     Social History Social History   Tobacco Use  . Smoking status: Former Smoker    Packs/day: 1.00    Years: 40.00    Pack years: 40.00    Types: Cigarettes    Last attempt to quit: 03/02/1992    Years since quitting: 26.3  . Smokeless tobacco: Never Used  Substance Use Topics  . Alcohol use: No  . Drug use: No     Allergies   Patient has no known allergies.   Review of Systems Review of Systems  Constitutional: Negative for activity change, chills and fever.  HENT: Negative for congestion and sore throat.   Respiratory: Positive for shortness of breath. Negative for cough and wheezing.   Cardiovascular: Positive for chest pain.  Gastrointestinal: Negative for abdominal pain, diarrhea, nausea and vomiting.  Genitourinary: Negative for dysuria.  Musculoskeletal: Negative for back pain and neck pain.  Skin: Negative for rash.  Allergic/Immunologic: Negative for immunocompromised state.  Neurological: Negative for dizziness, syncope, weakness, numbness and headaches.  Psychiatric/Behavioral: Negative for confusion.   Physical Exam Updated Vital Signs BP (!) 162/71    Pulse 66   Temp 98.5 F (36.9 C) (Oral)   Resp 15   SpO2 98%   Physical Exam  Constitutional: No distress.  HENT:  Head: Normocephalic.  Eyes: Conjunctivae are normal.  Neck: Neck supple.  Cardiovascular: Normal rate, regular rhythm, normal heart sounds and intact distal pulses. Exam reveals no gallop and no friction rub.  No murmur heard. Pulmonary/Chest: Effort normal. No respiratory distress. She exhibits tenderness.  TTP to the right anterior and lateral inferior ribs.  No crepitus, step-offs, or obvious deformities.  No TTP to the right posterior ribs.  Left ribs are nontender.  Crackles in the bilateral bases.  Equal breath sounds bilaterally.  Abdominal: Soft. Bowel sounds are normal. She exhibits no distension and no mass. There is no tenderness. There is no rebound and no guarding. No hernia.  Musculoskeletal: She exhibits no tenderness.  No tenderness to palpation to the bilateral hips. NVI to the bilateral upper and lower extremities.   Neurological: She is alert.  Skin: Skin is warm. No rash noted. She is not diaphoretic.  Very large area of deep purple ecchymosis noted to most of the entire right buttock.  Psychiatric: Her behavior is normal.  Nursing note and vitals reviewed.  ED Treatments / Results  Labs (all labs ordered are listed, but only abnormal results are displayed) Labs Reviewed  BASIC METABOLIC PANEL - Abnormal; Notable for the following components:      Result Value   Glucose, Bld 145 (*)    All other components within normal limits  CBC WITH DIFFERENTIAL/PLATELET  I-STAT TROPONIN, ED    EKG EKG Interpretation  Date/Time:  Thursday July 01 2018 14:02:33 EDT Ventricular Rate:  66 PR Interval:    QRS Duration: 166 QT Interval:  462 QTC Calculation: 485 R Axis:   -74 Text Interpretation:  Nonspecific IVCD with LAD LVH with secondary repolarization abnormality Anterior infarct, old When comapred to prior, similar paced rhythm  no STEMI  Confirmed by Antony Blackbird [id number]) on 07/01/2018 3:45:23 PM   Radiology Ct Chest W Contrast  Result Date: 07/01/2018 CLINICAL DATA:  Recent diagnosis of pneumonia. Fall yesterday. RIGHT-sided chest pain. EXAM: CT CHEST WITH CONTRAST TECHNIQUE: Multidetector CT imaging of the chest was performed during intravenous contrast administration. CONTRAST:  100 mL OMNIPAQUE IOHEXOL 300 MG/ML  SOLN COMPARISON:  Chest CT angiogram dated 12/11/2014.  FINDINGS: Cardiovascular: Mild cardiomegaly. No pericardial effusion. LEFT chest wall pacemaker/ICD hardware in place. Prominent pulmonary arteries indicating chronic pulmonary artery hypertension. No thoracic aortic aneurysm or evidence of aortic dissection. Scattered aortic atherosclerosis. Scattered coronary artery calcifications. Mediastinum/Nodes: No mass or enlarged lymph nodes seen within the mediastinum or perihilar regions. Esophagus appears normal. Trachea and central bronchi are unremarkable. Lungs/Pleura: Mild scarring/atelectasis at the LEFT lung base. Lungs otherwise clear. No pleural effusion or pneumothorax. Upper Abdomen: No acute findings. Musculoskeletal: Displaced fracture of the RIGHT posterolateral eighth rib. Additional slightly displaced fracture of the RIGHT posterolateral ninth rib. IMPRESSION: 1. Slightly displaced fractures of the RIGHT posterolateral eighth and ninth ribs. No associated hemothorax or pneumothorax. 2. No evidence of pneumonia or pulmonary edema. 3. Prominent pulmonary arteries bilaterally indicating some degree of chronic pulmonary artery hypertension. 4.  Aortic Atherosclerosis (ICD10-I70.0). 5. Cardiomegaly. Electronically Signed   By: Franki Cabot M.D.   On: 07/01/2018 16:24    Procedures Procedures (including critical care time)  Medications Ordered in ED Medications  magnesium sulfate IVPB 2 g 50 mL (has no administration in time range)  methylPREDNISolone sodium succinate (SOLU-MEDROL) 125 mg/2 mL injection 125 mg  (has no administration in time range)  traMADol (ULTRAM) tablet 50 mg (50 mg Oral Given 07/01/18 1523)  albuterol (PROVENTIL) (2.5 MG/3ML) 0.083% nebulizer solution 5 mg (5 mg Nebulization Given 07/01/18 1627)  ipratropium (ATROVENT) nebulizer solution 0.5 mg (0.5 mg Nebulization Given 07/01/18 1628)  iohexol (OMNIPAQUE) 300 MG/ML solution 75 mL (75 mLs Intravenous Contrast Given 07/01/18 1559)     Initial Impression / Assessment and Plan / ED Course  I have reviewed the triage vital signs and the nursing notes.  Pertinent labs & imaging results that were available during my care of the patient were reviewed by me and considered in my medical decision making (see chart for details).  Clinical Course as of Jul 02 1727  Thu Jul 01, 2018  1700 IMPRESSION: 1. Slightly displaced fractures of the RIGHT posterolateral eighth and ninth ribs. No associated hemothorax or pneumothorax. 2. No evidence of pneumonia or pulmonary edema. 3. Prominent pulmonary arteries bilaterally indicating some degree of chronic pulmonary artery hypertension. 4. Aortic Atherosclerosis (ICD10-I70.0). 5. Cardiomegaly.  CT Chest W Contrast [CG]    Clinical Course User Index [CG] Kinnie Feil, PA-C    77 year old female with a h/o of complete heart block s/o pacemaker, CHF, CAD, and DM who presents to the emergency department from urgent care with a chief complaint of fall.  Patient was discussed with Dr. Sherry Ruffing, attending physician.  She endorses pain to the right ribs from the fall. She was noted to have an SaO2 at 81% at [facility]. SaO2 of 88% at rest in the ED. She has a disc of her chest x-ray from urgent care today in the ED where she was diagnosed with pneumonia. Symptoms are atypical for pneumonia  as she has no fever or cough. Will order chest CT. Tramadol given for pain. Labs in the ED are reassuring.  Anticipate she will require admission for hypoxia since she does not have home O2. Patient care transferred  to [place] at the end of my shift. Patient presentation, ED course, and plan of care discussed with review of all pertinent labs and imaging. Please see his/her note for further details regarding further ED course and disposition.  Final Clinical Impressions(s) / ED Diagnoses   Final diagnoses:  None    ED Discharge Orders    None  Joanne Gavel, PA-C 07/01/18 1611    Joanne Gavel, PA-C 07/01/18 1728    Tegeler, Gwenyth Allegra, MD 07/02/18 [phone]

## 2018-07-01 NOTE — ED Provider Notes (Signed)
Hand off from previous EDPA. See previous note for full details.   Briefly, pt is a 77 yo F who fell yesterday presents with right rib pain and hypoxia. She has some wheezing per daughter. Sent from UC for concern of PNA on CXR.  No fevers, chills.   Physical Exam  BP (!) 162/71   Pulse 66   Temp 98.5 F (36.9 C) (Oral)   Resp 15   SpO2 98%   Physical Exam  Constitutional: She appears well-developed. No distress.  HENT:  Head: Atraumatic.  Eyes: Pupils are equal, round, and reactive to light. EOM are normal.  Neck: Normal range of motion.  Cardiovascular: Normal rate and regular rhythm.  1+ radial and DP pulses bilaterally. No calf edema or tenderness.   Pulmonary/Chest: Effort normal. She has wheezes. She exhibits tenderness.  Faint expiratory wheezing in right upper lobe. 93% at rest on 2 L [place]. Right lateral rib tenderness.   Musculoskeletal: Normal range of motion.  Neurological: She is alert.  Skin: Skin is warm and dry.  Psychiatric: Her behavior is normal.    ED Course/Procedures   Clinical Course as of Jul 01 1741  Thu Jul 01, 2018  1700 IMPRESSION: 1. Slightly displaced fractures of the RIGHT posterolateral eighth and ninth ribs. No associated hemothorax or pneumothorax. 2. No evidence of pneumonia or pulmonary edema. 3. Prominent pulmonary arteries bilaterally indicating some degree of chronic pulmonary artery hypertension. 4. Aortic Atherosclerosis (ICD10-I70.0). 5. Cardiomegaly.  CT Chest W Contrast [CG]    Clinical Course User Index [CG] Kinnie Feil, PA-C    .Critical Care Performed by: Kinnie Feil, PA-C Authorized by: Kinnie Feil, PA-C   Critical care provider statement:    Critical care time (minutes):  45   Critical care was necessary to treat or prevent imminent or life-threatening deterioration of the following conditions:  Respiratory failure   Critical care was time spent personally by me on the following activities:   Discussions with consultants, evaluation of patient's response to treatment, examination of patient, ordering and performing treatments and interventions, ordering and review of radiographic studies, pulse oximetry, re-evaluation of patient's condition, obtaining history from patient or surrogate, review of old charts and ordering and review of laboratory studies   I assumed direction of critical care for this patient from another provider in my specialty: yes      MDM   1740: Pt does not use or have oxygen at home. Admitted to hospitalist service for acute respiratory failure in setting of right rib fractures. Will tx for COPD exacerbation with mg, solumedrol, duoneb in ER. Pt shared with Dr. Sherry Ruffing. Plan discussed with pt and daughter at bedside who are comfortable with plan.        Kinnie Feil, PA-C 07/01/18 1742    Tegeler, Gwenyth Allegra, MD 07/02/18 [phone]

## 2018-07-01 NOTE — ED Triage Notes (Signed)
Pt presents from PCP for evaluation of pneumonia and low O2. Pt was seen today after fall yesterday with R sided chest and side pain. Chest xray preformed, O2 sats as low as 81% in PCP office.

## 2018-07-01 NOTE — ED Notes (Signed)
ED Provider at bedside. 

## 2018-07-01 NOTE — H&P (Signed)
History and Physical  [name] RCV:[id number] DOB: [birth year] DOA: 07/01/2018  Referring physician: Dr Donney Rankins PCP: Gaynelle Arabian, MD  Outpatient Specialists: Cardiology Patient coming from: Home  Chief Complaint: Right rib pain  HPI: [name] is a 77 y.o. female with medical history significant for HTN, complete heart block s/p pacemakjer, chronic dCHF, pulmonary HTN, OSA who presented to [facility] ED after being referred by Urgent Care due to severe right ribs pain. Reports she was in her normal state of health prior to a fall that occurred yesterday 06/30/18 in which she hit the right side of her back while falling. Went to urgent care today due to excrutiated pain. CXR was done there and was told she had PNA. Instructed to come to the ED at [facility]. On presentation to [facility] ED O2 sat in the low 80's. CT chest w contrast done revealed slightly displaced fractures of the R posterolateral 8th and 9th ribs with no associated hemothorax or pneumothorax. States it is hard and painful to breathe since her fall. Not on O2 supplement at baseline. TRH asked to admit.  ED Course: Accelerated HTN, hypoxia with O2 sat 86 RA. No U/A, will obtain. Will r/o bacterial PNA by obtaining procalcitonin.  Review of Systems: Review of systems as noted in the HPI. All other systems reviewed and are negative.   Past Medical History:  Diagnosis Date  . Arthritis    "hands, spine" (12/07/2014)  . CHB (complete heart block) ([place]) 09/19/2013  . CHF (congestive heart failure) ([place]) dx'd 12/06/2014  . Chronic pain    "since neck OR in 2007"  . Coronary artery disease    normal coronaries by 09/10/00 cath  . Diabetes mellitus    hga1c 7.1 no meds  . Dysrhythmia   . HTN (hypertension) 09/19/2013  . Hypercholesterolemia   . Hyperlipidemia 09/19/2013  . Hypertension   . Myocardial infarction ([place]) dx'd 2009  . Neuropathy of both feet   . Pacemaker   . 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 Shortness of breath    exertional  . Type II diabetes mellitus ([place])    "had it years ago; lost weight after neck OR in 2007; glucose went way down; recently dx'd as returned recently" (12/07/2014)  . Urinary incontinence    "since 2007's OR"  . Venous insufficiency (chronic) (peripheral)    Past Surgical History:  Procedure Laterality Date  . ANTERIOR CERVICAL DECOMP/DISCECTOMY FUSION  2007  . BACK SURGERY    . CARDIAC CATHETERIZATION  09/10/2000   normal Coronary arteries, EF60%,   . CARDIAC PACEMAKER PLACEMENT  12/01/2007   [employer] [facility] [place] #[id number], dual chamber  . CARDIOVASCULAR STRESS TEST  08/28/2000   Cardiolite perfusion study, EF58%, low risk study,   . DILATION AND CURETTAGE OF UTERUS  1960's  . KNEE ARTHROSCOPY Right   . LAPAROSCOPIC CHOLECYSTECTOMY  1995  . LEFT AND RIGHT HEART CATHETERIZATION WITH CORONARY ANGIOGRAM N/A 12/14/2014   Procedure: LEFT AND RIGHT HEART CATHETERIZATION WITH CORONARY ANGIOGRAM;  Surgeon: Peter M Martinique, MD;  Location: [facility] CATH LAB;  Service: Cardiovascular;  Laterality: N/A;  . Lower Ext. Dopplers  06/12/2011   no evidence of thrombus, all vessels normal in size, no evidence of insuffiency  . LUMBAR LAMINECTOMY/DECOMPRESSION MICRODISCECTOMY  03/02/2012   Procedure: LUMBAR LAMINECTOMY/DECOMPRESSION MICRODISCECTOMY 2 LEVELS;  Surgeon: Floyce Stakes, MD;  Location: [place] NEURO ORS;  Service: Neurosurgery;  Laterality: Bilateral;  Lumbar three, lumbar four, lumbar  five Laminectomy/Foraminotomy  . NM MYOCAR PERF WALL MOTION  10/24/2008   protocol:Lexiscan, EF63%, normal study, scan negative for ischemia  . PPM GENERATOR CHANGEOUT N/A 05/26/2018   Procedure: PPM GENERATOR CHANGEOUT;  Surgeon: Sanda Klein, MD;  Location: [place] CV LAB;  Service: Cardiovascular;  Laterality: N/A;  . TRANSTHORACIC ECHOCARDIOGRAM  11/30/2007   normal echo, mean transaortic valve gradient 76mmHg    Social History:  reports that she quit smoking  about 26 years ago. Her smoking use included cigarettes. She has a 40.00 pack-year smoking history. She has never used smokeless tobacco. She reports that she does not drink alcohol or use drugs.   No Known Allergies  Family History  Problem Relation Age of Onset  . Diabetes Father   . Heart disease Father        before age 83  . Hyperlipidemia Father   . Hypertension Father   . Heart attack Father   . Diabetes Mother   . Heart disease Mother   . Hyperlipidemia Mother   . Hypertension Mother      Prior to Admission medications   Medication Sig Start Date End Date Taking? Authorizing Provider  acetaminophen (TYLENOL) 500 MG tablet Take 500 mg by mouth 4 (four) times daily -  with meals and at bedtime.   Yes [provider]  aspirin EC 81 MG tablet Take 81 mg by mouth daily.   Yes [provider]  furosemide (LASIX) 40 MG tablet TAKE 1 TABLET (40 MG TOTAL) BY MOUTH DAILY. Patient taking differently: Take 40 mg by mouth daily.  02/16/17  Yes [name], Mihai, MD  gabapentin (NEURONTIN) 300 MG capsule Take 900 mg by mouth 4 (four) times daily.    Yes [provider]  KLOR-CON M20 20 MEQ tablet TAKE 1 TABLET (20 MEQ TOTAL) BY MOUTH DAILY. 06/03/16  Yes [name], Mihai, MD  losartan (COZAAR) 25 MG tablet Take 2 tablets by mouth daily. For blood pressure 05/21/18  Yes [provider]  pravastatin (PRAVACHOL) 40 MG tablet Take 40 mg by mouth daily.   Yes [provider]    Physical Exam: BP (!) 166/61   Pulse 77   Temp 98.5 F (36.9 C) (Oral)   Resp 16   SpO2 93%   . General: 77 y.o. year-old female well developed well nourished appears uncomfortable due to severe right side back pain.  Alert and oriented x3. . Cardiovascular: Regular rate and rhythm with no rubs or gallops.  No thyromegaly or JVD noted.  No lower extremity edema. 2/4 pulses in all 4 extremities. Marland Kitchen Respiratory: Diffused rales with no wheezes. Poor inspiratory  effort. . Abdomen: Soft nontender nondistended with normal bowel sounds x4 quadrants. . Muskuloskeletal: No cyanosis, clubbing or edema noted bilaterally . Neuro: CN II-XII intact, strength, sensation, reflexes . Skin: No ulcerative lesions noted or rashes . Psychiatry: Judgement and insight appear normal. Mood is appropriate for condition and setting          Labs on Admission:  Basic Metabolic Panel: Recent Labs  Lab 07/01/18 1345  NA 136  K 4.6  CL 100  CO2 31  GLUCOSE 145*  BUN 18  CREATININE 0.63  CALCIUM 9.1   Liver Function Tests: No results for input(s): AST, ALT, ALKPHOS, BILITOT, PROT, ALBUMIN in the last 168 hours. No results for input(s): LIPASE, AMYLASE in the last 168 hours. No results for input(s): AMMONIA in the last 168 hours. CBC: Recent Labs  Lab 07/01/18 1345  WBC 7.8  NEUTROABS 5.3  HGB 13.9  HCT 45.6  MCV 93.6  PLT 184   Cardiac Enzymes: No results for input(s): CKTOTAL, CKMB, CKMBINDEX, TROPONINI in the last 168 hours.  BNP (last 3 results) No results for input(s): BNP in the last 8760 hours.  ProBNP (last 3 results) No results for input(s): PROBNP in the last 8760 hours.  CBG: No results for input(s): GLUCAP in the last 168 hours.  Radiological Exams on Admission: Ct Chest W Contrast  Result Date: 07/01/2018 CLINICAL DATA:  Recent diagnosis of pneumonia. Fall yesterday. RIGHT-sided chest pain. EXAM: CT CHEST WITH CONTRAST TECHNIQUE: Multidetector CT imaging of the chest was performed during intravenous contrast administration. CONTRAST:  100 mL OMNIPAQUE IOHEXOL 300 MG/ML  SOLN COMPARISON:  Chest CT angiogram dated 12/11/2014. FINDINGS: Cardiovascular: Mild cardiomegaly. No pericardial effusion. LEFT chest wall pacemaker/ICD hardware in place. Prominent pulmonary arteries indicating chronic pulmonary artery hypertension. No thoracic aortic aneurysm or evidence of aortic dissection. Scattered aortic atherosclerosis. Scattered coronary  artery calcifications. Mediastinum/Nodes: No mass or enlarged lymph nodes seen within the mediastinum or perihilar regions. Esophagus appears normal. Trachea and central bronchi are unremarkable. Lungs/Pleura: Mild scarring/atelectasis at the LEFT lung base. Lungs otherwise clear. No pleural effusion or pneumothorax. Upper Abdomen: No acute findings. Musculoskeletal: Displaced fracture of the RIGHT posterolateral eighth rib. Additional slightly displaced fracture of the RIGHT posterolateral ninth rib. IMPRESSION: 1. Slightly displaced fractures of the RIGHT posterolateral eighth and ninth ribs. No associated hemothorax or pneumothorax. 2. No evidence of pneumonia or pulmonary edema. 3. Prominent pulmonary arteries bilaterally indicating some degree of chronic pulmonary artery hypertension. 4.  Aortic Atherosclerosis (ICD10-I70.0). 5. Cardiomegaly. Electronically Signed   By: Franki Cabot M.D.   On: 07/01/2018 16:24    EKG: I independently viewed the EKG done and my findings are as followed: ventricular paced w rate 66.  Assessment/Plan Present on Admission: . Acute respiratory failure with hypoxia (HCC)  Active Problems:   Acute respiratory failure with hypoxia (HCC)  Acute hypoxic respiratory failure 2/2 to poor inspiration from 8th and 9th ribs fractures Start IV dilaudid 0.5 mg q2h prn for severe pain Start oxycontin 10 mg BID Start senokot 2 tabs BID and miralax daily for bowel regimen Maintain O2 sat 92% or greater Avoid sedatives Closely monitor on telemetry Continuous O2 monitoring  Ribs 8th and 9th fractures post fall No hemothorax or pneumothorax on CT chest w contrast Monitor O2 sat and vital signs closely Pain management and bowel regimen in place  Accelerated HTN Suspect contributed by severe pain Control pain Resume home antihypertensive meds On losartan Start IV hydralazine 10 mg prn for sbp>180 or OZD>664  Type 2 DM complicated by hyperglycemia Obtain A1c Start  ISS Cardiac carb modified diet  Chronic dCHF No acute issues Last 2D echo done on 12/08/14 revealed preserved LVEF Strict I&Os and daily weight C/w lasix and losartan  Neuropathic pain On gabapentin    DVT prophylaxis: SCDs   Code Status: Full   Family Communication: Daughter at bedside  Disposition Plan: Admit to telemetry   Consults called: None   Admission status: Inpatient     Kayleen Memos MD [facility] Pager [phone]  If 7PM-7AM, please contact night-coverage [email or web address] Password [email or web address]  07/01/2018, 5:56 PM

## 2018-07-01 NOTE — ED Provider Notes (Signed)
Patient placed in Quick Look pathway, seen and evaluated   Chief Complaint: fall  HPI:  PRAPTI GRUSSING is a 77 y.o. female who presents to the ED s/p fall. Patient reports she fell yesterday and thought she was ok but this morning had severe pain in her right ribs. Patient went to her PCP and they told her she had pneumonia and sent her to the ED. Patient reports O2 SAT in the office 81%.  ROS: Chest: right rib pain   Physical Exam:  BP (!) 168/93 (BP Location: Right Arm)   Pulse 70   Temp 98.5 F (36.9 C) (Oral)   Resp 16   SpO2 (!) 88%    Gen: No distress  Neuro: Awake and Alert  Skin: Warm and dry  Chest: tender with palpation right ribs,   Resp: decreased breath sounds and O2 SAT 80 on R/A and increased to 88% with O2  Initiation of care has begun. The patient has been counseled on the process, plan, and necessity for staying for the completion/evaluation, and the remainder of the medical screening examination    Ashley Murrain, NP 07/01/18 1341    Lacretia Leigh, MD 07/02/18 [phone]

## 2018-07-02 ENCOUNTER — Inpatient Hospital Stay (HOSPITAL_COMMUNITY): Payer: Medicare Other

## 2018-07-02 ENCOUNTER — Other Ambulatory Visit: Payer: Self-pay

## 2018-07-02 ENCOUNTER — Encounter (HOSPITAL_COMMUNITY): Payer: Self-pay | Admitting: *Deleted

## 2018-07-02 DIAGNOSIS — S2239XA Fracture of one rib, unspecified side, initial encounter for closed fracture: Secondary | ICD-10-CM

## 2018-07-02 DIAGNOSIS — J9611 Chronic respiratory failure with hypoxia: Secondary | ICD-10-CM

## 2018-07-02 DIAGNOSIS — E119 Type 2 diabetes mellitus without complications: Secondary | ICD-10-CM

## 2018-07-02 LAB — GLUCOSE, CAPILLARY
GLUCOSE-CAPILLARY: 232 mg/dL — AB (ref 70–99)
GLUCOSE-CAPILLARY: 157 mg/dL — AB (ref 70–99)
GLUCOSE-CAPILLARY: 145 mg/dL — AB (ref 70–99)
Glucose-Capillary: 191 mg/dL — ABNORMAL HIGH (ref 70–99)

## 2018-07-02 LAB — CBC
HEMATOCRIT: 46.4 % — AB (ref 36.0–46.0)
Hemoglobin: 14.4 g/dL (ref 12.0–15.0)
MCH: 29 pg (ref 26.0–34.0)
MCHC: 31 g/dL (ref 30.0–36.0)
MCV: 93.5 fL (ref 80.0–100.0)
Platelets: 204 10*3/uL (ref 150–400)
RBC: 4.96 MIL/uL (ref 3.87–5.11)
RDW: 13.1 % (ref 11.5–15.5)
WBC: 10.2 10*3/uL (ref 4.0–10.5)
nRBC: 0 % (ref 0.0–0.2)

## 2018-07-02 LAB — BASIC METABOLIC PANEL
Anion gap: 7 (ref 5–15)
BUN: 16 mg/dL (ref 8–23)
CO2: 33 mmol/L — AB (ref 22–32)
CREATININE: 0.69 mg/dL (ref 0.44–1.00)
Calcium: 8.9 mg/dL (ref 8.9–10.3)
Chloride: 97 mmol/L — ABNORMAL LOW (ref 98–111)
GFR calc non Af Amer: >60 mL/min (ref >60)
Glucose, Bld: 221 mg/dL — ABNORMAL HIGH (ref 70–99)
Potassium: 4.1 mmol/L (ref 3.5–5.1)
SODIUM: 137 mmol/L (ref 135–145)

## 2018-07-02 LAB — HEMOGLOBIN A1C
Hgb A1c MFr Bld: 7 % — ABNORMAL HIGH (ref 4.8–5.6)
Mean Plasma Glucose: 154.2 mg/dL

## 2018-07-02 MED ORDER — SODIUM CHLORIDE 0.9 % IV SOLN
INTRAVENOUS | Status: AC
  Administered 2018-07-02 (×2): via INTRAVENOUS

## 2018-07-02 MED ORDER — LIDOCAINE VISCOUS HCL 2 % MT SOLN
15.0000 mL | Freq: Once | OROMUCOSAL | Status: AC
  Administered 2018-07-02: 15 mL via ORAL
  Filled 2018-07-02: qty 15

## 2018-07-02 MED ORDER — INSULIN ASPART 100 UNIT/ML ~~LOC~~ SOLN: 0.0000 [IU] | Freq: Every day | SUBCUTANEOUS | Status: DC

## 2018-07-02 MED ORDER — ALUM & MAG HYDROXIDE-SIMETH 200-200-20 MG/5ML PO SUSP
30.0000 mL | Freq: Once | ORAL | Status: AC
  Administered 2018-07-02: 30 mL via ORAL
  Filled 2018-07-02: qty 30

## 2018-07-02 MED ORDER — OXYCODONE HCL ER 15 MG PO T12A
15.0000 mg | EXTENDED_RELEASE_TABLET | Freq: Two times a day (BID) | ORAL | Status: DC
  Administered 2018-07-02 – 2018-07-03 (×3): 15 mg via ORAL
  Filled 2018-07-02 (×3): qty 1

## 2018-07-02 MED ORDER — ORAL CARE MOUTH RINSE
15.0000 mL | Freq: Two times a day (BID) | OROMUCOSAL | Status: DC
  Administered 2018-07-02 – 2018-07-03 (×2): 15 mL via OROMUCOSAL

## 2018-07-02 MED ORDER — GABAPENTIN 300 MG PO CAPS
300.0000 mg | ORAL_CAPSULE | Freq: Three times a day (TID) | ORAL | Status: DC
  Administered 2018-07-02 – 2018-07-03 (×4): 300 mg via ORAL
  Filled 2018-07-02 (×4): qty 1

## 2018-07-02 MED ORDER — IPRATROPIUM-ALBUTEROL 0.5-2.5 (3) MG/3ML IN SOLN: 3.0000 mL | Freq: Four times a day (QID) | RESPIRATORY_TRACT | Status: DC | PRN

## 2018-07-02 MED ORDER — GABAPENTIN 100 MG PO CAPS: 100.0000 mg | ORAL_CAPSULE | Freq: Four times a day (QID) | ORAL | Status: DC

## 2018-07-02 MED ORDER — HYOSCYAMINE SULFATE 0.125 MG SL SUBL: 0.2500 mg | SUBLINGUAL_TABLET | Freq: Once | SUBLINGUAL | Status: DC

## 2018-07-02 MED ORDER — INSULIN ASPART 100 UNIT/ML ~~LOC~~ SOLN
0.0000 [IU] | Freq: Three times a day (TID) | SUBCUTANEOUS | Status: DC
  Administered 2018-07-02: 3 [IU] via SUBCUTANEOUS
  Administered 2018-07-02 – 2018-07-03 (×2): 2 [IU] via SUBCUTANEOUS
  Administered 2018-07-03: 1 [IU] via SUBCUTANEOUS

## 2018-07-02 NOTE — Plan of Care (Signed)
Discussed with patient and family plan of care for the evening, pain management and pain medications with some teach back displayed.

## 2018-07-02 NOTE — Evaluation (Signed)
Physical Therapy Evaluation Patient Details Name: [name] MRN: [id number] DOB: 1940-12-20 Today's Date: 07/02/2018   History of Present Illness  77yo female referred to the ED by Urgent Care due to severe rib pain following a fall at home. Found to be hypoxic on room air and with R 8th and 9th rib fractures, clear of hemo or pneumothorax. PMH complete heart block requiring ICD, CHF, DM, HTN, MI, neuropathy, paralysed right hand, ACDF, back surgery, cardiac cath, knee scope   Clinical Impression   Patient received up transferring to recliner with nursing staff and family, present and willing to participate in PT today. Daughter present and assisted with providing PLOF and chair follow during gait, motivating patient to participate. She is able to perform all functional transfers with min guard and heavy cues for sequencing and safety, and was able to ambulate approximately 43f x3 with close min guard and rest breaks between each bout of gait. SpO2 down to 87-88% on 2LPM O2 with gait but recovered to 90-92% with seated rest and cues for pursed lip breathing. Able to toilet and perform self care with min guard at bedside commode. She was left up in the recliner with all needs met, chair alarm active, and family present. She will continue to benefit from skilled PT services in the acute setting as well as skilled HHPT and 24/7 S from family moving forward; note if family cannot provide 24/7 S, she will require ST-SNF.     Follow Up Recommendations Home health PT;Supervision/Assistance - 24 hour;Other (comment)(if family cannot provide 24/7 S, she will require SNF )    Equipment Recommendations  None recommended by PT(has all necessary DME )    Recommendations for Other Services       Precautions / Restrictions Precautions Precautions: Fall;Other (comment) Precaution Comments: watch SpO2  Restrictions Weight Bearing Restrictions: No      Mobility  Bed Mobility                General bed mobility comments: OOB with nursing staff   Transfers Overall transfer level: Needs assistance Equipment used: Rolling walker (2 wheeled) Transfers: Sit to/from Stand Sit to Stand: Min guard         General transfer comment: min guard and extended time, Mod cues for safety and sequencing   Ambulation/Gait Ambulation/Gait assistance: Min guard Gait Distance (Feet): 50 Feet(x3 ) Assistive device: Rolling walker (2 wheeled) Gait Pattern/deviations: Step-through pattern;Decreased step length - right;Decreased step length - left;Decreased stride length;Decreased dorsiflexion - right;Decreased dorsiflexion - left;Trunk flexed;Narrow base of support Gait velocity: decreased    General Gait Details: slow antalgic gait pattern, cues for upright posture and breathing strategies. Able to ambulate [id number] with rest breaks in between. SpO2 drop to 87-88% each bout of gait but recovered to 90-92% with pursed lip breathing on 2LPM.   Stairs            Wheelchair Mobility    Modified Rankin (Stroke Patients Only)       Balance Overall balance assessment: Needs assistance;History of Falls Sitting-balance support: Bilateral upper extremity supported;Feet supported Sitting balance-Leahy Scale: Good     Standing balance support: Bilateral upper extremity supported;During functional activity Standing balance-Leahy Scale: Poor Standing balance comment: recent fall at home and heavy reliance on B UE support                              Pertinent Vitals/Pain Pain Assessment: Faces  Faces Pain Scale: Hurts even more Pain Location: ribs  Pain Descriptors / Indicators: Sharp;Guarding Pain Intervention(s): Limited activity within patient's tolerance;Monitored during session;Repositioned    Home Living Family/patient expects to be discharged to:: Private residence Living Arrangements: Alone Available Help at Discharge: Family;Available 24 hours/day Type of Home:  House Home Access: Stairs to enter Entrance Stairs-Rails: Right Entrance Stairs-Number of Steps: 5 Home Layout: One level Home Equipment: Walker - 2 wheels;Wheelchair - manual;Grab bars - tub/shower;Cane - single point;Bedside commode      Prior Function Level of Independence: Independent with assistive device(s)               Hand Dominance        Extremity/Trunk Assessment   Upper Extremity Assessment Upper Extremity Assessment: Defer to OT evaluation    Lower Extremity Assessment Lower Extremity Assessment: Generalized weakness    Cervical / Trunk Assessment Cervical / Trunk Assessment: Kyphotic  Communication   Communication: No difficulties  Cognition Arousal/Alertness: Awake/alert Behavior During Therapy: WFL for tasks assessed/performed Overall Cognitive Status: Within Functional Limits for tasks assessed                                        General Comments      Exercises     Assessment/Plan    PT Assessment Patient needs continued PT services  PT Problem List Decreased strength;Decreased mobility;Decreased safety awareness;Decreased coordination;Decreased activity tolerance;Decreased balance;Pain       PT Treatment Interventions DME instruction;Therapeutic activities;Gait training;Therapeutic exercise;Patient/family education;Stair training;Balance training;Functional mobility training;Neuromuscular re-education    PT Goals (Current goals can be found in the Care Plan section)  Acute Rehab PT Goals Patient Stated Goal: to feel better, go home  PT Goal Formulation: With patient/family Time For Goal Achievement: 07/16/18 Potential to Achieve Goals: Good    Frequency Min 3X/week   Barriers to discharge        Co-evaluation               AM-PAC PT "6 Clicks" Daily Activity  Outcome Measure Difficulty turning over in bed (including adjusting bedclothes, sheets and blankets)?: A Little Difficulty moving from lying on  back to sitting on the side of the bed? : A Little Difficulty sitting down on and standing up from a chair with arms (e.g., wheelchair, bedside commode, etc,.)?: A Little Help needed moving to and from a bed to chair (including a wheelchair)?: A Little Help needed walking in hospital room?: A Little Help needed climbing 3-5 steps with a railing? : A Lot 6 Click Score: 17    End of Session Equipment Utilized During Treatment: Oxygen Activity Tolerance: Patient limited by fatigue Patient left: in chair;with chair alarm set;with call bell/phone within reach;with family/visitor present Nurse Communication: Mobility status PT Visit Diagnosis: Unsteadiness on feet (R26.81);Muscle weakness (generalized) (M62.81);History of falling (Z91.81)    Time: 0850-0940 PT Time Calculation (min) (ACUTE ONLY): 50 min   Charges:   PT Evaluation $PT Eval Low Complexity: 1 Low PT Treatments $Gait Training: 8-22 mins $Therapeutic Activity: 8-22 mins        Kristen Unger PT, DPT, CBIS  Supplemental Physical Therapist [place]    Pager [phone] Acute Rehab Office [phone]    

## 2018-07-02 NOTE — Progress Notes (Signed)
[facility] PROGRESS NOTE    Progress Note  [name]  EGB:[id number] DOB: 1941-01-06 DOA: 07/01/2018 PCP: Gaynelle Arabian, MD     Brief Narrative:   [name] is an 77 y.o. female past medical history of hypertension, complete heart block with a pacer, chronic diastolic heart failure, pulmonary hypertension who presents to the ED due to severe right rib pain after fall on 06/30/2018, chest x-ray done in the ED show possible pneumonia she was satting in the low 90s.  CT with contrast revealed displaced fracture eighth and ninth  Assessment/Plan:   Acute respiratory failure with hypoxia (HCC) due to rib fractures on eighth and ninth with poor inspiratory effort: Currently on narcotics, and bowel regimen. Still requiring oxygen replacement. Consult physical therapy. Patient is not requesting narcotics for pain as she relates she does not want to get addicted. Increase long-acting start her on incentive spirometry.  Essential hypertension: Pressure continues to be elevated question of due to pain will continue IV hydralazine as needed.    Diabetes mellitus type 2: Continue sliding scale insulin.  Chronic diastolic heart failure: Continue strict I's and O's diuretics and ACE inhibitor were held on admission  Pulmonary hypertension ([place]) Continue current medication.  Diabetes mellitus type 2, controlled, without complications (HCC) Continue sliding scale insulin.   DVT prophylaxis: lovenox Family Communication:daughter Disposition Plan/Barrier to D/C: hopefully home in am Code Status:     Code Status Orders  (From admission, onward)         Start     Ordered   07/01/18 1753  Full code  Continuous     07/01/18 1752        Code Status History    Date Active Date Inactive Code Status Order ID Comments User Context   05/26/2018 1517 05/26/2018 2007 Full Code [id number]  Sanda Klein, MD Inpatient   12/14/2014 1508 12/15/2014 1646 Full Code [id number]   [place], Peter M, MD Inpatient   12/06/2014 1656 12/14/2014 1508 Full Code [id number]  Brett Canales, PA-C ED        IV Access:    Peripheral IV   Procedures and diagnostic studies:   Ct Chest W Contrast  Result Date: 07/01/2018 CLINICAL DATA:  Recent diagnosis of pneumonia. Fall yesterday. RIGHT-sided chest pain. EXAM: CT CHEST WITH CONTRAST TECHNIQUE: Multidetector CT imaging of the chest was performed during intravenous contrast administration. CONTRAST:  100 mL OMNIPAQUE IOHEXOL 300 MG/ML  SOLN COMPARISON:  Chest CT angiogram dated 12/11/2014. FINDINGS: Cardiovascular: Mild cardiomegaly. No pericardial effusion. LEFT chest wall pacemaker/ICD hardware in place. Prominent pulmonary arteries indicating chronic pulmonary artery hypertension. No thoracic aortic aneurysm or evidence of aortic dissection. Scattered aortic atherosclerosis. Scattered coronary artery calcifications. Mediastinum/Nodes: No mass or enlarged lymph nodes seen within the mediastinum or perihilar regions. Esophagus appears normal. Trachea and central bronchi are unremarkable. Lungs/Pleura: Mild scarring/atelectasis at the LEFT lung base. Lungs otherwise clear. No pleural effusion or pneumothorax. Upper Abdomen: No acute findings. Musculoskeletal: Displaced fracture of the RIGHT posterolateral eighth rib. Additional slightly displaced fracture of the RIGHT posterolateral ninth rib. IMPRESSION: 1. Slightly displaced fractures of the RIGHT posterolateral eighth and ninth ribs. No associated hemothorax or pneumothorax. 2. No evidence of pneumonia or pulmonary edema. 3. Prominent pulmonary arteries bilaterally indicating some degree of chronic pulmonary artery hypertension. 4.  Aortic Atherosclerosis (ICD10-I70.0). 5. Cardiomegaly. Electronically Signed   By: Franki Cabot M.D.   On: 07/01/2018 16:24     Medical Consultants:    None.  Anti-Infectives:  None  Subjective:    [name] she relates her pain is not  controlled, anorexic not hungry.  Objective:    Vitals:   07/01/18 2019 07/01/18 2050 07/01/18 2359 07/02/18 0417  BP: (!) 186/81  (!) 170/80   Pulse: 77 72 87   Resp:  20 18   Temp:   98.5 F (36.9 C)   TempSrc:   Oral   SpO2: 95% 97% 96%   Weight:    93.6 kg    Intake/Output Summary (Last 24 hours) at 07/02/2018 0720 Last data filed at 07/02/2018 0719 Gross per 24 hour  Intake 488.41 ml  Output 600 ml  Net -111.59 ml   Filed Weights   07/02/18 0417  Weight: 93.6 kg    Exam: General exam: In no acute distress. Respiratory system: Good air movement and clear to auscultation. Cardiovascular system: S1 & S2 heard, RRR. Gastrointestinal system: Abdomen is nondistended, soft and nontender.  Central nervous system: Alert and oriented. No focal neurological deficits. Extremities: No pedal edema. Skin: No rashes, lesions or ulcers Psychiatry: Judgement and insight appear normal. Mood & affect appropriate.    Data Reviewed:    Labs: Basic Metabolic Panel: Recent Labs  Lab 07/01/18 1345 07/01/18 2146 07/02/18 0553  NA 136  --  137  K 4.6  --  4.1  CL 100  --  97*  CO2 31  --  33*  GLUCOSE 145*  --  221*  BUN 18  --  16  CREATININE 0.63  --  0.69  CALCIUM 9.1  --  8.9  MG  --  2.1  --    GFR Estimated Creatinine Clearance: 68.1 mL/min (by C-G formula based on SCr of 0.69 mg/dL). Liver Function Tests: No results for input(s): AST, ALT, ALKPHOS, BILITOT, PROT, ALBUMIN in the last 168 hours. No results for input(s): LIPASE, AMYLASE in the last 168 hours. No results for input(s): AMMONIA in the last 168 hours. Coagulation profile No results for input(s): INR, PROTIME in the last 168 hours.  CBC: Recent Labs  Lab 07/01/18 1345 07/02/18 0553  WBC 7.8 10.2  NEUTROABS 5.3  --   HGB 13.9 14.4  HCT 45.6 46.4*  MCV 93.6 93.5  PLT 184 204   Cardiac Enzymes: No results for input(s): CKTOTAL, CKMB, CKMBINDEX, TROPONINI in the last 168 hours. BNP (last 3  results) No results for input(s): PROBNP in the last 8760 hours. CBG: No results for input(s): GLUCAP in the last 168 hours. D-Dimer: No results for input(s): DDIMER in the last 72 hours. Hgb A1c: No results for input(s): HGBA1C in the last 72 hours. Lipid Profile: No results for input(s): CHOL, HDL, LDLCALC, TRIG, CHOLHDL, LDLDIRECT in the last 72 hours. Thyroid function studies: No results for input(s): TSH, T4TOTAL, T3FREE, THYROIDAB in the last 72 hours.  Invalid input(s): FREET3 Anemia work up: No results for input(s): VITAMINB12, FOLATE, FERRITIN, TIBC, IRON, RETICCTPCT in the last 72 hours. Sepsis Labs: Recent Labs  Lab 07/01/18 1345 07/01/18 1943 07/02/18 0553  PROCALCITON  --  <0.10  --   WBC 7.8  --  10.2  LATICACIDVEN  --  1.2  --    Microbiology No results found for this or any previous visit (from the past 240 hour(s)).   Medications:   . acetaminophen  500 mg Oral TID WC & HS  . aspirin EC  81 mg Oral Daily  . gabapentin  300 mg Oral BID  . insulin aspart  0-5 Units  Subcutaneous QHS  . insulin aspart  0-9 Units Subcutaneous TID WC  . losartan  50 mg Oral Daily  . oxyCODONE  10 mg Oral Q12H  . polyethylene glycol  17 g Oral Daily  . senna-docusate  2 tablet Oral BID   Continuous Infusions: . sodium chloride    . magnesium sulfate 1 - 4 g bolus IVPB Stopped (07/01/18 2312)      LOS: 1 day   Charlynne Cousins  [facility] Pager [phone]  *Please refer to [email or web address], password [email or web address] to get updated schedule on who will round on this patient, as hospitalists switch teams weekly. If 7PM-7AM, please contact night-coverage at [email or web address], password [email or web address] for any overnight needs.  07/02/2018, 7:20 AM

## 2018-07-02 NOTE — Care Management Note (Signed)
Case Management Note  Patient Details  Name: GEARLDENE FIORENZA MRN: [id number] Date of Birth: 1940/09/10  Subjective/Objective:   From home alone, she has great family support per daughter Otila Kluver, she will have someone with her at all times, they do not want SNF, prefer to go home.  NCM offered choice and daughter and patient chose [facility], referral given to Butch Penny for [place].  Soc will begin 24-48 hrs post dc. Will need HHPT orders with face to face prior to dc.                 Action/Plan: DC home when ready.  Expected Discharge Date:                  Expected Discharge Plan:  [place]  In-House Referral:     Discharge planning Services  CM Consult  Post Acute Care Choice:  Home Health Choice offered to:  Adult Children, Patient  DME Arranged:    DME Agency:     HH Arranged:  PT HH Agency:  [place]  Status of Service:  Completed, signed off  If discussed at [place] of Stay Meetings, dates discussed:    Additional Comments:  Zenon Mayo, RN 07/02/2018, 4:30 PM

## 2018-07-03 DIAGNOSIS — E119 Type 2 diabetes mellitus without complications: Secondary | ICD-10-CM

## 2018-07-03 DIAGNOSIS — I10 Essential (primary) hypertension: Secondary | ICD-10-CM

## 2018-07-03 DIAGNOSIS — I272 Pulmonary hypertension, unspecified: Secondary | ICD-10-CM

## 2018-07-03 DIAGNOSIS — J9601 Acute respiratory failure with hypoxia: Secondary | ICD-10-CM

## 2018-07-03 DIAGNOSIS — S2249XD Multiple fractures of ribs, unspecified side, subsequent encounter for fracture with routine healing: Secondary | ICD-10-CM

## 2018-07-03 LAB — GLUCOSE, CAPILLARY
Glucose-Capillary: 215 mg/dL — ABNORMAL HIGH (ref 70–99)
Glucose-Capillary: 168 mg/dL — ABNORMAL HIGH (ref 70–99)
Glucose-Capillary: 134 mg/dL — ABNORMAL HIGH (ref 70–99)

## 2018-07-03 MED ORDER — OXYCODONE HCL 5 MG PO TABS: 5.0000 mg | ORAL_TABLET | ORAL | 0 refills | Status: DC | PRN

## 2018-07-03 NOTE — Progress Notes (Addendum)
SATURATION QUALIFICATIONS: (This note is used to comply with regulatory documentation for home oxygen)  Patient Saturations on Room Air at Rest = 85%  Patient Saturations on 2 Liters of oxygen while at rest 94%  Please briefly explain why patient needs home oxygen:  Pt needs home O2 because desats while lying in bed at rest without any activity.  Unable to tolerate activity at all without O2.

## 2018-07-03 NOTE — Discharge Summary (Signed)
Physician Discharge Summary  [name]:[id number] DOB: 02-26-1941 DOA: 07/01/2018  PCP: Gaynelle Arabian, MD  Admit date: 07/01/2018 Discharge date: 07/03/2018  Admitted From: home Disposition:  home  Recommendations for Outpatient Follow-up:  1. Follow up with PCP in 1-2 weeks  Home Health: PT Equipment/Devices: home O2  Discharge Condition: stable CODE STATUS: Full code Diet recommendation: regular  HPI: Per Dr. Nevada Crane, [name] is a 77 y.o. female with medical history significant for HTN, complete heart block s/p pacemakjer, chronic dCHF, pulmonary HTN, OSA who presented to [facility] ED after being referred by Urgent Care due to severe right ribs pain. Reports she was in her normal state of health prior to a fall that occurred yesterday 06/30/18 in which she hit the right side of her back while falling. Went to urgent care today due to excrutiated pain. CXR was done there and was told she had PNA. Instructed to come to the ED at [facility]. On presentation to [facility] ED O2 sat in the low 80's. CT chest w contrast done revealed slightly displaced fractures of the R posterolateral 8th and 9th ribs with no associated hemothorax or pneumothorax. States it is hard and painful to breathe since her fall. Not on O2 supplement at baseline. TRH asked to admit.  Hospital Course: Acute respiratory failure with hypoxia (HCC) due to rib fractures on eighth and ninth with poor inspiratory effort - patient improved with supplemental oxygen and pain control, able to ambulate more and work with PT. She was discharged home in stable condition, continue IS. She is requiring O2 on d/c, suspect multifactorial due to hypoventilation, may have COPD given 20+ years of smoking and pulmonary HTN. She was initially suspected of pneumonia however has no cough / congestion, she is afebrile and without leukocytosis.  Essential hypertension - continue home medications Diabetes mellitus type 2 - continue home  medications Chronic diastolic heart failure - euvolemic on d/c Pulmonary hypertension ([place]) -Continue current medication.  Discharge Diagnoses:  Active Problems:   Essential hypertension   Pulmonary hypertension (HCC)   Chronic respiratory failure with hypoxia (HCC)   Acute respiratory failure with hypoxia (HCC)   Rib fracture   Diabetes mellitus type 2, controlled, without complications ([place])     Discharge Instructions   Allergies as of 07/03/2018   No Known Allergies     Medication List    TAKE these medications   acetaminophen 500 MG tablet Commonly known as:  TYLENOL Take 500 mg by mouth 4 (four) times daily -  with meals and at bedtime.   aspirin EC 81 MG tablet Take 81 mg by mouth daily.   furosemide 40 MG tablet Commonly known as:  LASIX TAKE 1 TABLET (40 MG TOTAL) BY MOUTH DAILY. What changed:  See the new instructions.   gabapentin 300 MG capsule Commonly known as:  NEURONTIN Take 900 mg by mouth 4 (four) times daily.   KLOR-CON M20 20 MEQ tablet Generic drug:  potassium chloride SA TAKE 1 TABLET (20 MEQ TOTAL) BY MOUTH DAILY.   losartan 25 MG tablet Commonly known as:  COZAAR Take 2 tablets by mouth daily. For blood pressure   oxyCODONE 5 MG immediate release tablet Commonly known as:  Oxy IR/ROXICODONE Take 1 tablet (5 mg total) by mouth every 4 (four) hours as needed for moderate pain or breakthrough pain.   pravastatin 40 MG tablet Commonly known as:  PRAVACHOL Take 40 mg by mouth daily.  Durable Medical Equipment  (From admission, onward)         Start     Ordered   07/03/18 1014  For home use only DME oxygen  Once    Question Answer Comment  Mode or (Route) Nasal cannula   Liters per Minute 2   Frequency Continuous (stationary and portable oxygen unit needed)   Oxygen delivery system Gas      07/03/18 1014         Follow-up Information    [facility].   Specialty:  Home Health  Services Why:  HHPT and home oxygen Contact information: [place] [address] [phone]           Consultations:  None   Procedures/Studies:  Ct Chest W Contrast  Result Date: 07/01/2018 CLINICAL DATA:  Recent diagnosis of pneumonia. Fall yesterday. RIGHT-sided chest pain. EXAM: CT CHEST WITH CONTRAST TECHNIQUE: Multidetector CT imaging of the chest was performed during intravenous contrast administration. CONTRAST:  100 mL OMNIPAQUE IOHEXOL 300 MG/ML  SOLN COMPARISON:  Chest CT angiogram dated 12/11/2014. FINDINGS: Cardiovascular: Mild cardiomegaly. No pericardial effusion. LEFT chest wall pacemaker/ICD hardware in place. Prominent pulmonary arteries indicating chronic pulmonary artery hypertension. No thoracic aortic aneurysm or evidence of aortic dissection. Scattered aortic atherosclerosis. Scattered coronary artery calcifications. Mediastinum/Nodes: No mass or enlarged lymph nodes seen within the mediastinum or perihilar regions. Esophagus appears normal. Trachea and central bronchi are unremarkable. Lungs/Pleura: Mild scarring/atelectasis at the LEFT lung base. Lungs otherwise clear. No pleural effusion or pneumothorax. Upper Abdomen: No acute findings. Musculoskeletal: Displaced fracture of the RIGHT posterolateral eighth rib. Additional slightly displaced fracture of the RIGHT posterolateral ninth rib. IMPRESSION: 1. Slightly displaced fractures of the RIGHT posterolateral eighth and ninth ribs. No associated hemothorax or pneumothorax. 2. No evidence of pneumonia or pulmonary edema. 3. Prominent pulmonary arteries bilaterally indicating some degree of chronic pulmonary artery hypertension. 4.  Aortic Atherosclerosis (ICD10-I70.0). 5. Cardiomegaly. Electronically Signed   By: Franki Cabot M.D.   On: 07/01/2018 16:24   Dg Chest Port 1 View  Result Date: 07/02/2018 CLINICAL DATA:  Multiple rib fractures EXAM: PORTABLE CHEST 1 VIEW COMPARISON:  Due CT yesterday  FINDINGS: Rib fractures by prior CT are difficult to visualize radiographically. No newly detected osseous trauma. Blunting of the lateral left costophrenic sulcus from scarring. Normal heart size. There are dual-chamber pacer leads from the left. IMPRESSION: Known right-sided rib fractures. No pneumothorax or other acute or interval finding. Electronically Signed   By: Monte Fantasia M.D.   On: 07/02/2018 08:20      Subjective: Overall feeling well, stronger than yesterday, breathing and pain improved  Discharge Exam: Vitals:   07/02/18 2336 07/03/18 0828  BP: (!) 160/63 (!) 144/57  Pulse: 72 69  Resp: 16 16  Temp: 98 F (36.7 C) (!) 97.4 F (36.3 C)  SpO2: 94% 93%    General: Pt is alert, awake, not in acute distress Cardiovascular: RRR, S1/S2 +, no rubs, no gallops Respiratory: CTA bilaterally, no wheezing, no rhonchi Abdominal: Soft, NT, ND, bowel sounds +   The results of significant diagnostics from this hospitalization (including imaging, microbiology, ancillary and laboratory) are listed below for reference.     Microbiology: Recent Results (from the past 240 hour(s))  Culture, Urine     Status: Abnormal (Preliminary result)   Collection Time: 07/01/18 10:30 PM  Result Value Ref Range Status   Specimen Description URINE, CATHETERIZED  Final   Special Requests  Final    NONE Performed at [facility], [place] [address], [place], [place] [address]    Culture >=100,000 COLONIES/mL PROTEUS MIRABILIS (A)  Final   Report Status PENDING  Incomplete     Labs: BNP (last 3 results) No results for input(s): BNP in the last 8760 hours. Basic Metabolic Panel: Recent Labs  Lab 07/01/18 1345 07/01/18 2146 07/02/18 0553  NA 136  --  137  K 4.6  --  4.1  CL 100  --  97*  CO2 31  --  33*  GLUCOSE 145*  --  221*  BUN 18  --  16  CREATININE 0.63  --  0.69  CALCIUM 9.1  --  8.9  MG  --  2.1  --    Liver Function Tests: No results for input(s): AST, ALT, ALKPHOS,  BILITOT, PROT, ALBUMIN in the last 168 hours. No results for input(s): LIPASE, AMYLASE in the last 168 hours. No results for input(s): AMMONIA in the last 168 hours. CBC: Recent Labs  Lab 07/01/18 1345 07/02/18 0553  WBC 7.8 10.2  NEUTROABS 5.3  --   HGB 13.9 14.4  HCT 45.6 46.4*  MCV 93.6 93.5  PLT 184 204   Cardiac Enzymes: No results for input(s): CKTOTAL, CKMB, CKMBINDEX, TROPONINI in the last 168 hours. BNP: Invalid input(s): POCBNP CBG: Recent Labs  Lab 07/02/18 1708 07/02/18 2153 07/03/18 0828 07/03/18 1027 07/03/18 1202  GLUCAP 145* 191* 215* 168* 134*   D-Dimer No results for input(s): DDIMER in the last 72 hours. Hgb A1c Recent Labs    07/02/18 0553  HGBA1C 7.0*   Lipid Profile No results for input(s): CHOL, HDL, LDLCALC, TRIG, CHOLHDL, LDLDIRECT in the last 72 hours. Thyroid function studies No results for input(s): TSH, T4TOTAL, T3FREE, THYROIDAB in the last 72 hours.  Invalid input(s): FREET3 Anemia work up No results for input(s): VITAMINB12, FOLATE, FERRITIN, TIBC, IRON, RETICCTPCT in the last 72 hours. Urinalysis    Component Value Date/Time   COLORURINE YELLOW 07/01/2018 1030   APPEARANCEUR CLOUDY (A) 07/01/2018 1030   LABSPEC >1.046 (H) 07/01/2018 1030   PHURINE 9.0 (H) 07/01/2018 1030   GLUCOSEU NEGATIVE 07/01/2018 1030   HGBUR NEGATIVE 07/01/2018 1030   BILIRUBINUR NEGATIVE 07/01/2018 1030   KETONESUR 20 (A) 07/01/2018 1030   PROTEINUR NEGATIVE 07/01/2018 1030   UROBILINOGEN 0.2 12/06/2014 1642   NITRITE NEGATIVE 07/01/2018 1030   LEUKOCYTESUR NEGATIVE 07/01/2018 1030   Sepsis Labs Invalid input(s): PROCALCITONIN,  WBC,  LACTICIDVEN   Time coordinating discharge: 35 minutes  SIGNED:  Marzetta Board, MD  [facility] 07/03/2018, 5:07 PM Pager [phone]  If 7PM-7AM, please contact night-coverage [email or web address] Password [email or web address]

## 2018-07-03 NOTE — Care Management Note (Signed)
Case Management Note  Patient Details  Name: [name] MRN: [id number] Date of Birth: February 13, 1941  Subjective/Objective:                    Action/Plan:  Spoke w patient at  Bedside on speaker with her daughter who will provide transport home. Patient will have home oxygen delivered to room by [facility] today prior to DC. Patient will receive [place] services through [facility]. No other CM needs. CM signing off.  Anticipate home oxygen to be delivered to room by 3pm. Both patient and daughter aware of time frame.   Expected Discharge Date:  07/03/18               Expected Discharge Plan:  [place]  In-House Referral:     Discharge planning Services  CM Consult  Post Acute Care Choice:  Home Health Choice offered to:  Adult Children, Patient  DME Arranged:  Oxygen DME Agency:  [place] Arranged:  PT [place] Agency:  [place]  Status of Service:  Completed, signed off  If discussed at [place] of Stay Meetings, dates discussed:    Additional Comments:  Carles Collet, RN 07/03/2018, 10:42 AM

## 2018-07-04 DIAGNOSIS — I272 Pulmonary hypertension, unspecified: Secondary | ICD-10-CM | POA: Diagnosis not present

## 2018-07-04 DIAGNOSIS — E1142 Type 2 diabetes mellitus with diabetic polyneuropathy: Secondary | ICD-10-CM | POA: Diagnosis not present

## 2018-07-04 DIAGNOSIS — E785 Hyperlipidemia, unspecified: Secondary | ICD-10-CM | POA: Diagnosis not present

## 2018-07-04 DIAGNOSIS — Z950 Presence of cardiac pacemaker: Secondary | ICD-10-CM | POA: Diagnosis not present

## 2018-07-04 DIAGNOSIS — I5032 Chronic diastolic (congestive) heart failure: Secondary | ICD-10-CM | POA: Diagnosis not present

## 2018-07-04 DIAGNOSIS — G4733 Obstructive sleep apnea (adult) (pediatric): Secondary | ICD-10-CM | POA: Diagnosis not present

## 2018-07-04 DIAGNOSIS — Z7982 Long term (current) use of aspirin: Secondary | ICD-10-CM | POA: Diagnosis not present

## 2018-07-04 DIAGNOSIS — E1151 Type 2 diabetes mellitus with diabetic peripheral angiopathy without gangrene: Secondary | ICD-10-CM | POA: Diagnosis not present

## 2018-07-04 DIAGNOSIS — I110 Hypertensive heart disease with heart failure: Secondary | ICD-10-CM | POA: Diagnosis not present

## 2018-07-04 DIAGNOSIS — S2241XD Multiple fractures of ribs, right side, subsequent encounter for fracture with routine healing: Secondary | ICD-10-CM | POA: Diagnosis not present

## 2018-07-04 DIAGNOSIS — Z9981 Dependence on supplemental oxygen: Secondary | ICD-10-CM | POA: Diagnosis not present

## 2018-07-04 DIAGNOSIS — J9611 Chronic respiratory failure with hypoxia: Secondary | ICD-10-CM | POA: Diagnosis not present

## 2018-07-04 DIAGNOSIS — Z87891 Personal history of nicotine dependence: Secondary | ICD-10-CM | POA: Diagnosis not present

## 2018-07-04 DIAGNOSIS — W19XXXD Unspecified fall, subsequent encounter: Secondary | ICD-10-CM | POA: Diagnosis not present

## 2018-07-04 LAB — URINE CULTURE: Culture: >=100000 — AB

## 2018-07-07 DIAGNOSIS — J9611 Chronic respiratory failure with hypoxia: Secondary | ICD-10-CM | POA: Diagnosis not present

## 2018-07-07 DIAGNOSIS — I272 Pulmonary hypertension, unspecified: Secondary | ICD-10-CM | POA: Diagnosis not present

## 2018-07-07 DIAGNOSIS — I5032 Chronic diastolic (congestive) heart failure: Secondary | ICD-10-CM | POA: Diagnosis not present

## 2018-07-07 DIAGNOSIS — E1142 Type 2 diabetes mellitus with diabetic polyneuropathy: Secondary | ICD-10-CM | POA: Diagnosis not present

## 2018-07-07 DIAGNOSIS — S2241XD Multiple fractures of ribs, right side, subsequent encounter for fracture with routine healing: Secondary | ICD-10-CM | POA: Diagnosis not present

## 2018-07-07 DIAGNOSIS — I110 Hypertensive heart disease with heart failure: Secondary | ICD-10-CM | POA: Diagnosis not present

## 2018-07-08 DIAGNOSIS — I5032 Chronic diastolic (congestive) heart failure: Secondary | ICD-10-CM | POA: Diagnosis not present

## 2018-07-08 DIAGNOSIS — E1142 Type 2 diabetes mellitus with diabetic polyneuropathy: Secondary | ICD-10-CM | POA: Diagnosis not present

## 2018-07-08 DIAGNOSIS — I110 Hypertensive heart disease with heart failure: Secondary | ICD-10-CM | POA: Diagnosis not present

## 2018-07-08 DIAGNOSIS — I272 Pulmonary hypertension, unspecified: Secondary | ICD-10-CM | POA: Diagnosis not present

## 2018-07-08 DIAGNOSIS — J9611 Chronic respiratory failure with hypoxia: Secondary | ICD-10-CM | POA: Diagnosis not present

## 2018-07-08 DIAGNOSIS — S2241XD Multiple fractures of ribs, right side, subsequent encounter for fracture with routine healing: Secondary | ICD-10-CM | POA: Diagnosis not present

## 2018-07-13 DIAGNOSIS — I5032 Chronic diastolic (congestive) heart failure: Secondary | ICD-10-CM | POA: Diagnosis not present

## 2018-07-13 DIAGNOSIS — I272 Pulmonary hypertension, unspecified: Secondary | ICD-10-CM | POA: Diagnosis not present

## 2018-07-13 DIAGNOSIS — I110 Hypertensive heart disease with heart failure: Secondary | ICD-10-CM | POA: Diagnosis not present

## 2018-07-13 DIAGNOSIS — S2241XD Multiple fractures of ribs, right side, subsequent encounter for fracture with routine healing: Secondary | ICD-10-CM | POA: Diagnosis not present

## 2018-07-13 DIAGNOSIS — J9611 Chronic respiratory failure with hypoxia: Secondary | ICD-10-CM | POA: Diagnosis not present

## 2018-07-13 DIAGNOSIS — E1142 Type 2 diabetes mellitus with diabetic polyneuropathy: Secondary | ICD-10-CM | POA: Diagnosis not present

## 2018-07-15 DIAGNOSIS — E1142 Type 2 diabetes mellitus with diabetic polyneuropathy: Secondary | ICD-10-CM | POA: Diagnosis not present

## 2018-07-15 DIAGNOSIS — J9611 Chronic respiratory failure with hypoxia: Secondary | ICD-10-CM | POA: Diagnosis not present

## 2018-07-15 DIAGNOSIS — I272 Pulmonary hypertension, unspecified: Secondary | ICD-10-CM | POA: Diagnosis not present

## 2018-07-15 DIAGNOSIS — S2241XD Multiple fractures of ribs, right side, subsequent encounter for fracture with routine healing: Secondary | ICD-10-CM | POA: Diagnosis not present

## 2018-07-15 DIAGNOSIS — I110 Hypertensive heart disease with heart failure: Secondary | ICD-10-CM | POA: Diagnosis not present

## 2018-07-15 DIAGNOSIS — I5032 Chronic diastolic (congestive) heart failure: Secondary | ICD-10-CM | POA: Diagnosis not present

## 2018-07-20 DIAGNOSIS — J9611 Chronic respiratory failure with hypoxia: Secondary | ICD-10-CM | POA: Diagnosis not present

## 2018-07-20 DIAGNOSIS — E1142 Type 2 diabetes mellitus with diabetic polyneuropathy: Secondary | ICD-10-CM | POA: Diagnosis not present

## 2018-07-20 DIAGNOSIS — S2241XD Multiple fractures of ribs, right side, subsequent encounter for fracture with routine healing: Secondary | ICD-10-CM | POA: Diagnosis not present

## 2018-07-20 DIAGNOSIS — I110 Hypertensive heart disease with heart failure: Secondary | ICD-10-CM | POA: Diagnosis not present

## 2018-07-20 DIAGNOSIS — I5032 Chronic diastolic (congestive) heart failure: Secondary | ICD-10-CM | POA: Diagnosis not present

## 2018-07-20 DIAGNOSIS — I272 Pulmonary hypertension, unspecified: Secondary | ICD-10-CM | POA: Diagnosis not present

## 2018-07-22 DIAGNOSIS — I5032 Chronic diastolic (congestive) heart failure: Secondary | ICD-10-CM | POA: Diagnosis not present

## 2018-07-22 DIAGNOSIS — I110 Hypertensive heart disease with heart failure: Secondary | ICD-10-CM | POA: Diagnosis not present

## 2018-07-22 DIAGNOSIS — E1142 Type 2 diabetes mellitus with diabetic polyneuropathy: Secondary | ICD-10-CM | POA: Diagnosis not present

## 2018-07-22 DIAGNOSIS — J9611 Chronic respiratory failure with hypoxia: Secondary | ICD-10-CM | POA: Diagnosis not present

## 2018-07-22 DIAGNOSIS — I272 Pulmonary hypertension, unspecified: Secondary | ICD-10-CM | POA: Diagnosis not present

## 2018-07-22 DIAGNOSIS — S2241XD Multiple fractures of ribs, right side, subsequent encounter for fracture with routine healing: Secondary | ICD-10-CM | POA: Diagnosis not present

## 2018-07-23 ENCOUNTER — Other Ambulatory Visit: Payer: Self-pay | Admitting: Family Medicine

## 2018-07-23 DIAGNOSIS — E2839 Other primary ovarian failure: Secondary | ICD-10-CM

## 2018-07-23 DIAGNOSIS — S2239XA Fracture of one rib, unspecified side, initial encounter for closed fracture: Secondary | ICD-10-CM | POA: Diagnosis not present

## 2018-07-23 DIAGNOSIS — R0789 Other chest pain: Secondary | ICD-10-CM | POA: Diagnosis not present

## 2018-07-23 DIAGNOSIS — R0902 Hypoxemia: Secondary | ICD-10-CM | POA: Diagnosis not present

## 2018-07-26 DIAGNOSIS — J9611 Chronic respiratory failure with hypoxia: Secondary | ICD-10-CM | POA: Diagnosis not present

## 2018-07-26 DIAGNOSIS — I5032 Chronic diastolic (congestive) heart failure: Secondary | ICD-10-CM | POA: Diagnosis not present

## 2018-07-26 DIAGNOSIS — E1142 Type 2 diabetes mellitus with diabetic polyneuropathy: Secondary | ICD-10-CM | POA: Diagnosis not present

## 2018-07-26 DIAGNOSIS — I110 Hypertensive heart disease with heart failure: Secondary | ICD-10-CM | POA: Diagnosis not present

## 2018-07-26 DIAGNOSIS — I272 Pulmonary hypertension, unspecified: Secondary | ICD-10-CM | POA: Diagnosis not present

## 2018-07-26 DIAGNOSIS — S2241XD Multiple fractures of ribs, right side, subsequent encounter for fracture with routine healing: Secondary | ICD-10-CM | POA: Diagnosis not present

## 2018-07-28 DIAGNOSIS — I5032 Chronic diastolic (congestive) heart failure: Secondary | ICD-10-CM | POA: Diagnosis not present

## 2018-07-28 DIAGNOSIS — I272 Pulmonary hypertension, unspecified: Secondary | ICD-10-CM | POA: Diagnosis not present

## 2018-07-28 DIAGNOSIS — I110 Hypertensive heart disease with heart failure: Secondary | ICD-10-CM | POA: Diagnosis not present

## 2018-07-28 DIAGNOSIS — E1142 Type 2 diabetes mellitus with diabetic polyneuropathy: Secondary | ICD-10-CM | POA: Diagnosis not present

## 2018-07-28 DIAGNOSIS — S2241XD Multiple fractures of ribs, right side, subsequent encounter for fracture with routine healing: Secondary | ICD-10-CM | POA: Diagnosis not present

## 2018-07-28 DIAGNOSIS — J9611 Chronic respiratory failure with hypoxia: Secondary | ICD-10-CM | POA: Diagnosis not present

## 2018-08-03 DIAGNOSIS — E1142 Type 2 diabetes mellitus with diabetic polyneuropathy: Secondary | ICD-10-CM | POA: Diagnosis not present

## 2018-08-03 DIAGNOSIS — S2241XD Multiple fractures of ribs, right side, subsequent encounter for fracture with routine healing: Secondary | ICD-10-CM | POA: Diagnosis not present

## 2018-08-03 DIAGNOSIS — I272 Pulmonary hypertension, unspecified: Secondary | ICD-10-CM | POA: Diagnosis not present

## 2018-08-03 DIAGNOSIS — J9611 Chronic respiratory failure with hypoxia: Secondary | ICD-10-CM | POA: Diagnosis not present

## 2018-08-03 DIAGNOSIS — I5032 Chronic diastolic (congestive) heart failure: Secondary | ICD-10-CM | POA: Diagnosis not present

## 2018-08-03 DIAGNOSIS — I110 Hypertensive heart disease with heart failure: Secondary | ICD-10-CM | POA: Diagnosis not present

## 2018-08-10 DIAGNOSIS — I272 Pulmonary hypertension, unspecified: Secondary | ICD-10-CM | POA: Diagnosis not present

## 2018-08-10 DIAGNOSIS — I110 Hypertensive heart disease with heart failure: Secondary | ICD-10-CM | POA: Diagnosis not present

## 2018-08-10 DIAGNOSIS — S2241XD Multiple fractures of ribs, right side, subsequent encounter for fracture with routine healing: Secondary | ICD-10-CM | POA: Diagnosis not present

## 2018-08-10 DIAGNOSIS — E1142 Type 2 diabetes mellitus with diabetic polyneuropathy: Secondary | ICD-10-CM | POA: Diagnosis not present

## 2018-08-10 DIAGNOSIS — I5032 Chronic diastolic (congestive) heart failure: Secondary | ICD-10-CM | POA: Diagnosis not present

## 2018-08-10 DIAGNOSIS — J9611 Chronic respiratory failure with hypoxia: Secondary | ICD-10-CM | POA: Diagnosis not present

## 2018-08-16 DIAGNOSIS — M5416 Radiculopathy, lumbar region: Secondary | ICD-10-CM | POA: Diagnosis not present

## 2018-08-17 DIAGNOSIS — E1142 Type 2 diabetes mellitus with diabetic polyneuropathy: Secondary | ICD-10-CM | POA: Diagnosis not present

## 2018-08-17 DIAGNOSIS — I110 Hypertensive heart disease with heart failure: Secondary | ICD-10-CM | POA: Diagnosis not present

## 2018-08-17 DIAGNOSIS — J9611 Chronic respiratory failure with hypoxia: Secondary | ICD-10-CM | POA: Diagnosis not present

## 2018-08-17 DIAGNOSIS — S2241XD Multiple fractures of ribs, right side, subsequent encounter for fracture with routine healing: Secondary | ICD-10-CM | POA: Diagnosis not present

## 2018-08-17 DIAGNOSIS — I272 Pulmonary hypertension, unspecified: Secondary | ICD-10-CM | POA: Diagnosis not present

## 2018-08-17 DIAGNOSIS — I5032 Chronic diastolic (congestive) heart failure: Secondary | ICD-10-CM | POA: Diagnosis not present

## 2018-08-18 ENCOUNTER — Other Ambulatory Visit: Payer: Self-pay | Admitting: Orthopedic Surgery

## 2018-08-18 ENCOUNTER — Telehealth: Payer: Self-pay | Admitting: Nurse Practitioner

## 2018-08-18 DIAGNOSIS — M5416 Radiculopathy, lumbar region: Secondary | ICD-10-CM

## 2018-08-18 NOTE — Telephone Encounter (Signed)
Phone call to patient to verify medication list and allergies for myelogram procedure. Pt aware she will not need to hold any medications for this procedure. 

## 2018-08-30 ENCOUNTER — Ambulatory Visit
Admission: RE | Admit: 2018-08-30 | Discharge: 2018-08-30 | Disposition: A | Discharge status: Home / Self Care | Payer: Medicare Other | Source: Ambulatory Visit | Attending: Orthopedic Surgery | Admitting: Orthopedic Surgery

## 2018-08-30 DIAGNOSIS — M5416 Radiculopathy, lumbar region: Secondary | ICD-10-CM

## 2018-08-30 DIAGNOSIS — M5126 Other intervertebral disc displacement, lumbar region: Secondary | ICD-10-CM | POA: Diagnosis not present

## 2018-08-30 DIAGNOSIS — S32010A Wedge compression fracture of first lumbar vertebra, initial encounter for closed fracture: Secondary | ICD-10-CM | POA: Diagnosis not present

## 2018-08-30 MED ORDER — IOPAMIDOL (ISOVUE-M 200) INJECTION 41%
15.0000 mL | Freq: Once | INTRAMUSCULAR | Status: AC
  Administered 2018-08-30: 15 mL via INTRATHECAL

## 2018-08-30 MED ORDER — DIAZEPAM 5 MG PO TABS
5.0000 mg | ORAL_TABLET | Freq: Once | ORAL | Status: AC
  Administered 2018-08-30: 5 mg via ORAL

## 2018-08-30 NOTE — Progress Notes (Signed)
O2 @ 2L/min via [place] switched to our tank in order to preserve her O2 supply.

## 2018-08-30 NOTE — Discharge Instructions (Signed)
Myelogram Discharge Instructions ° °1. Go home and rest quietly for the next 24 hours.  It is important to lie flat for the next 24 hours.  Get up only to go to the restroom.  You may lie in the bed or on a couch on your back, your stomach, your left side or your right side.  You may have one pillow under your head.  You may have pillows between your knees while you are on your side or under your knees while you are on your back. ° °2. DO NOT drive today.  Recline the seat as far back as it will go, while still wearing your seat belt, on the way home. ° °3. You may get up to go to the bathroom as needed.  You may sit up for 10 minutes to eat.  You may resume your normal diet and medications unless otherwise indicated.  Drink lots of extra fluids today and tomorrow. ° °4. The incidence of headache, nausea, or vomiting is about 5% (one in 20 patients).  If you develop a headache, lie flat and drink plenty of fluids until the headache goes away.  Caffeinated beverages may be helpful.  If you develop severe nausea and vomiting or a headache that does not go away with flat bed rest, call [phone]. ° °5. You may resume normal activities after your 24 hours of bed rest is over; however, do not exert yourself strongly or do any heavy lifting tomorrow. If when you get up you have a headache when standing, go back to bed and force fluids for another 24 hours. ° °6. Call your physician for a follow-up appointment.  The results of your myelogram will be sent directly to your physician by the following day. ° °7. If you have any questions or if complications develop after you arrive home, please call [phone]. ° °Discharge instructions have been explained to the patient.  The patient, or the person responsible for the patient, fully understands these instructions.  ° °  °

## 2018-09-08 ENCOUNTER — Encounter: Payer: Self-pay | Admitting: Cardiovascular Disease

## 2018-09-08 ENCOUNTER — Ambulatory Visit (INDEPENDENT_AMBULATORY_CARE_PROVIDER_SITE_OTHER): Payer: Medicare Other | Admitting: Cardiovascular Disease

## 2018-09-08 VITALS — BP 132/84 | HR 84 | Ht 67.0 in | Wt 189.4 lb

## 2018-09-08 DIAGNOSIS — I5032 Chronic diastolic (congestive) heart failure: Secondary | ICD-10-CM | POA: Diagnosis not present

## 2018-09-08 DIAGNOSIS — J9611 Chronic respiratory failure with hypoxia: Secondary | ICD-10-CM

## 2018-09-08 DIAGNOSIS — I2721 Secondary pulmonary arterial hypertension: Secondary | ICD-10-CM | POA: Diagnosis not present

## 2018-09-08 DIAGNOSIS — Z950 Presence of cardiac pacemaker: Secondary | ICD-10-CM | POA: Diagnosis not present

## 2018-09-08 DIAGNOSIS — K766 Portal hypertension: Secondary | ICD-10-CM

## 2018-09-08 DIAGNOSIS — I442 Atrioventricular block, complete: Secondary | ICD-10-CM | POA: Diagnosis not present

## 2018-09-08 MED ORDER — LOSARTAN POTASSIUM 50 MG PO TABS: 50.0000 mg | ORAL_TABLET | Freq: Every day | ORAL | 3 refills | Status: DC

## 2018-09-08 NOTE — Patient Instructions (Signed)
Medication Instructions:  Dr Sallyanne Kuster has recommended making the following medication changes: 1. STOP Irbesartan 2. CONTINUE Losartan 50 mg daily  If you need a refill on your cardiac medications before your next appointment, please call your pharmacy.   Testing/Procedures: Remote monitoring is used to monitor your Pacemaker or ICD from home. This monitoring reduces the number of office visits required to check your device to one time per year. It allows [place] to keep an eye on the functioning of your device to ensure it is working properly. You are scheduled for a device check from home on Wednesday, April 8th, 2020. You may send your transmission at any time that day. If you have a wireless device, the transmission will be sent automatically. After your physician reviews your transmission, you will receive a postcard with your next transmission date.  To improve our patient care and to more adequately follow your device, [facility] has decided, as a practice, to start following each patient four times a year with your home monitor. This means that you may experience a remote appointment that is close to an in-office appointment with your physician. Your insurance will apply at the same rate as other remote monitoring transmissions.  Follow-Up: At [facility], you and your health needs are our priority.  As part of our continuing mission to provide you with exceptional heart care, we have created designated Provider Care Teams.  These Care Teams include your primary Cardiologist (physician) and Advanced Practice Providers (APPs -  Physician Assistants and Nurse Practitioners) who all work together to provide you with the care you need, when you need it. You will need a follow up appointment in 12 months. You may see Sanda Klein, MD or one of the following Advanced Practice Providers on your designated Care Team: [place], [place] . Fabian Sharp, PA-C . You will receive a reminder letter in the  mail two months in advance. If you don't receive a letter, please call our office to schedule the follow-up appointment.

## 2018-09-08 NOTE — Progress Notes (Signed)
[name], female   DOB: [birth year], 78 y.o.   MRN: [id number]    Cardiology Office Note    Date:  09/10/2018   ID:  [name], DOB 15-Jan-1941, MRN [id number]  PCP:  Tammy Arabian, MD  Cardiologist:   Tammy Klein, MD   Chief Complaint  Patient presents with  . Follow-up    pt denied chest pain  . Pacemaker Check  . Congestive Heart Failure    History of Present Illness:  [name] is a 78 y.o. female with complete heart block/pacemaker dependent, St. Jude pacemaker (implanted November 28, 2007, generator change 2017/11/27 St. Jude Assurity), chronic diastolic heart failure, hypertension, COPD with chronic respiratory insufficiency on home oxygen. She returns for a pacemaker follow-up.  She had rib fractures after a fall in late October.  She has been using the oxygen during the daytime as well, not just at night since then.  Confusion regarding her antihypertensive medications.  Her list now contains both irbesartan 75 mg once daily and losartan 25 mg 2 tablets daily.  The patient specifically denies any chest pain at rest or with exertion, dyspnea at rest, orthopnea, paroxysmal nocturnal dyspnea, syncope, palpitations, focal neurological deficits, intermittent claudication, lower extremity edema, unexplained weight gain. She has chronic exertional dyspnea functional class 2-3 but has not had recent problems with cough, hemoptysis or wheezing.  Activity is primarily limited by back pain due to scoliosis.  She uses a walker.    Device function is normal.  Battery is at beginning of life with an estimated longevity of around 10-11 years.  She has complete heart block although occasional PVCs are measured device.  She is pacemaker dependent.  Lead parameters are generally normal but there is occasional "noise" on the atrial channel leading to artifactual episodes of mode switch.  She also has rare but true episodes of paroxysmal atrial tachycardia.  There is no true atrial  fibrillation.  No loss of capture has been seen.  She has quite a lot of atrial pacing, but her sinus rates and is in the high 50s.  We reduced the lower rate limit to 50 bpm to allow native rhythm.  She is very sedentary and it is unlikely she will require faster rates..    She had minor coronary artery disease by previous angiography and has never had angina pectoris. She had acute HF exacerbation due to severe hypertension in the weeks after her husband passed away in 28-Nov-2014. At that time she underwent coronary angiography that showed very minor disease (30% stenosis at the takeoff of the first diagonal artery), normal pulmonary wedge pressure and mild pulmonary hypertension (38/16).  Past Medical History:  Diagnosis Date  . Arthritis    "hands, spine" (12/07/2014)  . CHB (complete heart block) ([place]) 09/19/2013  . CHF (congestive heart failure) ([place]) dx'd 12/06/2014  . Chronic pain    "since neck OR in Nov 27, 2005"  . Coronary artery disease    normal coronaries by 09/10/00 cath  . Diabetes mellitus    hga1c 7.1 no meds  . Dysrhythmia   . HTN (hypertension) 09/19/2013  . Hypercholesterolemia   . Hyperlipidemia 09/19/2013  . Hypertension   . Myocardial infarction ([place]) dx'd 2007/11/28  . Neuropathy of both feet   . Pacemaker   . Paralysis of right hand ([place]) Nov 27, 2005   "after spinal cord injury/OR"  . Shortness of breath    exertional  . Type II diabetes mellitus ([place])    "had it years ago;  lost weight after neck OR in 2007; glucose went way down; recently dx'd as returned recently" (12/07/2014)  . Urinary incontinence    "since 2007's OR"  . Venous insufficiency (chronic) (peripheral)     Past Surgical History:  Procedure Laterality Date  . ANTERIOR CERVICAL DECOMP/DISCECTOMY FUSION  2007  . BACK SURGERY    . CARDIAC CATHETERIZATION  09/10/2000   normal Coronary arteries, EF60%,   . CARDIAC PACEMAKER PLACEMENT  12/01/2007   St.Jude Zephyr [place] #[id number], dual chamber  . CARDIOVASCULAR STRESS TEST   08/28/2000   Cardiolite perfusion study, EF58%, low risk study,   . DILATION AND CURETTAGE OF UTERUS  1960's  . KNEE ARTHROSCOPY Right   . LAPAROSCOPIC CHOLECYSTECTOMY  1995  . LEFT AND RIGHT HEART CATHETERIZATION WITH CORONARY ANGIOGRAM N/A 12/14/2014   Procedure: LEFT AND RIGHT HEART CATHETERIZATION WITH CORONARY ANGIOGRAM;  Surgeon: Tammy M Martinique, MD;  Location: [facility] CATH LAB;  Service: Cardiovascular;  Laterality: N/A;  . Lower Ext. Dopplers  06/12/2011   no evidence of thrombus, all vessels normal in size, no evidence of insuffiency  . LUMBAR LAMINECTOMY/DECOMPRESSION MICRODISCECTOMY  03/02/2012   Procedure: LUMBAR LAMINECTOMY/DECOMPRESSION MICRODISCECTOMY 2 LEVELS;  Surgeon: Tammy Stakes, MD;  Location: [facility]UR[facility] ORS;  Service: Neurosurgery;  Laterality: Bilateral;  Lumbar three, lumbar four, lumbar five Laminectomy/Foraminotomy  . NM MYOCAR PERF WALL MOTION  10/24/2008   protocol:Lexiscan, EF63%, normal study, scan negative for ischemia  . PPM GENERATOR CHANGEOUT N/A 05/26/2018   Procedure: PPM GENERATOR CHANGEOUT;  Surgeon: Tammy Klein, MD;  Location: [place] CV LAB;  Service: Cardiovascular;  Laterality: N/A;  . TRANSTHORACIC ECHOCARDIOGRAM  11/30/2007   normal echo, mean transaortic valve gradient 55mmHg    Current Medications: Outpatient Medications Prior to Visit  Medication Sig Dispense Refill  . acetaminophen (TYLENOL) 500 MG tablet Take 500 mg by mouth 4 (four) times daily -  with meals and at bedtime.    Marland Kitchen aspirin EC 81 MG tablet Take 81 mg by mouth daily.    . furosemide (LASIX) 40 MG tablet TAKE 1 TABLET (40 MG TOTAL) BY MOUTH DAILY. (Patient taking differently: Take 40 mg by mouth daily. ) 30 tablet 10  . ibuprofen (ADVIL,MOTRIN) 200 MG tablet Take 200 mg by mouth every 6 (six) hours as needed.    Marland Kitchen KLOR-CON M20 20 MEQ tablet TAKE 1 TABLET (20 MEQ TOTAL) BY MOUTH DAILY. 30 tablet 6  . oxyCODONE (OXY IR/ROXICODONE) 5 MG immediate release tablet Take 1 tablet (5 mg  total) by mouth every 4 (four) hours as needed for moderate pain or breakthrough pain. 30 tablet 0  . pravastatin (PRAVACHOL) 40 MG tablet Take 40 mg by mouth daily.    . irbesartan (AVAPRO) 75 MG tablet Take 75 mg by mouth daily.    Marland Kitchen losartan (COZAAR) 25 MG tablet Take 2 tablets by mouth daily. For blood pressure  3  . gabapentin (NEURONTIN) 300 MG capsule Take 900 mg by mouth 4 (four) times daily.      No facility-administered medications prior to visit.      Allergies:   Patient has no known allergies.   Social History   Socioeconomic History  . Marital status: Widowed    Spouse name: Not on file  . Number of children: Not on file  . Years of education: Not on file  . Highest education level: Not on file  Occupational History  . Occupation: Self employed- Academic librarian business   Social Needs  . Financial resource strain:  Not on file  . Food insecurity:    Worry: Not on file    Inability: Not on file  . Transportation needs:    Medical: Not on file    Non-medical: Not on file  Tobacco Use  . Smoking status: Former Smoker    Packs/day: 1.00    Years: 40.00    Pack years: 40.00    Types: Cigarettes    Last attempt to quit: 03/02/1992    Years since quitting: 26.5  . Smokeless tobacco: Never Used  Substance and Sexual Activity  . Alcohol use: No  . Drug use: No  . Sexual activity: Never    Birth control/protection: None  Lifestyle  . Physical activity:    Days per week: Not on file    Minutes per session: Not on file  . Stress: Not on file  Relationships  . Social connections:    Talks on phone: Not on file    Gets together: Not on file    Attends religious service: Not on file    Active member of club or organization: Not on file    Attends meetings of clubs or organizations: Not on file    Relationship status: Not on file  Other Topics Concern  . Not on file  Social History Narrative  . Not on file     Family History:  The patient's \  family history includes  Diabetes in her father and mother; Heart attack in her father; Heart disease in her father and mother; Hyperlipidemia in her father and mother; Hypertension in her father and mother.   ROS:   Please see the history of present illness.    ROS for other systems are reviewed and are negative   PHYSICAL EXAM:   VS:  BP 132/84   Pulse 84   Ht 5\' 7"  (1.702 m)   Wt 189 lb 6.4 oz (85.9 kg)   SpO2 93%   BMI 29.66 kg/m      General: Alert, oriented x3, no distress, healthy pacemaker site.  Prominent scoliosis Head: no evidence of trauma, PERRL, EOMI, no exophtalmos or lid lag, no myxedema, no xanthelasma; normal ears, nose and oropharynx Neck: normal jugular venous pulsations and no hepatojugular reflux; brisk carotid pulses without delay and no carotid bruits Chest: clear to auscultation, no signs of consolidation by percussion or palpation, normal fremitus, symmetrical and full respiratory excursions Cardiovascular: normal position and quality of the apical impulse, regular rhythm, normal first and paradoxically split second heart sounds, no murmurs, rubs or gallops Abdomen: no tenderness or distention, no masses by palpation, no abnormal pulsatility or arterial bruits, normal bowel sounds, no hepatosplenomegaly Extremities: no clubbing, cyanosis or edema; 2+ radial, ulnar and brachial pulses bilaterally; 2+ right femoral, posterior tibial and dorsalis pedis pulses; 2+ left femoral, posterior tibial and dorsalis pedis pulses; no subclavian or femoral bruits Neurological: grossly nonfocal Psych: Normal mood and affect   Wt Readings from Last 3 Encounters:  09/08/18 189 lb 6.4 oz (85.9 kg)  07/02/18 206 lb 5.6 oz (93.6 kg)  05/26/18 200 lb (90.7 kg)    Studies/Labs Reviewed:   EKG:  EKG is ordered today.  It shows atrial paced, ventricular sensed rhythm with sharp Q waves in the inferior leads, minor ST-T changes in leads V4-V5, 2, 3, aVF, borderline prolonged QTC 459 ms. Recent  Labs: 07/01/2018: Magnesium 2.1 07/02/2018: BUN 16; Creatinine, Ser 0.69; Hemoglobin 14.4; Platelets 204; Potassium 4.1; Sodium 137   Lipid Panel    Component  Value Date/Time   CHOL  11/30/2007 0510    158        ATP III CLASSIFICATION:  <200     mg/dL   Desirable  200-239  mg/dL   Borderline High  >=240    mg/dL   High   TRIG 174 (H) 11/30/2007 0510   HDL 43 11/30/2007 0510   CHOLHDL 3.7 11/30/2007 0510   VLDL 35 11/30/2007 0510   LDLCALC  11/30/2007 0510    80        Total Cholesterol/HDL:CHD Risk Coronary Heart Disease Risk Table                     Men   Women  1/2 Average Risk   3.4   3.3    ASSESSMENT:    1. Complete heart block ([place])   2. Pacemaker   3. Chronic diastolic heart failure ([place])   4. Chronic respiratory failure with hypoxia (H[facility])   5. PAH (pulmonary arterial hypertension) with portal hypertension (H[facility])    PLAN:  In order of problems listed above:  1. CHB: pacemaker dependent.  2. PM: Normal device function, remote downloads every 3 months and yearly office visits 3. CHF: Preserved LVEF.  Very sedentary, functional status limited primarily by back problems and COPD.  Respiratory status has deteriorated after her fall and rib fractures and I think she might end up being on oxygen permanently.  No overt hypervolemia.  There is been some confusion with her ARB prescription, probably changed due to the multiple recalls, she is to take losartan 50 mg daily and to stop irbesartan. 4. COPD:  Follows with Dr. Melvyn Novas, uses oxygen at home 100% of the time now. 5. PAH: Previous right heart catheterization showed that she had mild pulmonary hypertension in the absence of elevation in left heart pressures.  Pulmonary vascular disease is primarily driven by her chronic lung disease.    Medication Adjustments/Labs and Tests Ordered: Current medicines are reviewed at length with the patient today.  Concerns regarding medicines are outlined above.  Medication changes, Labs  and Tests ordered today are listed in the Patient Instructions below. Patient Instructions  Medication Instructions:  Dr Sallyanne Kuster has recommended making the following medication changes: 1. STOP Irbesartan 2. CONTINUE Losartan 50 mg daily  If you need a refill on your cardiac medications before your next appointment, please call your pharmacy.   Testing/Procedures: Remote monitoring is used to monitor your Pacemaker or ICD from home. This monitoring reduces the number of office visits required to check your device to one time per year. It allows [place] to keep an eye on the functioning of your device to ensure it is working properly. You are scheduled for a device check from home on Wednesday, April 8th, 2020. You may send your transmission at any time that day. If you have a wireless device, the transmission will be sent automatically. After your physician reviews your transmission, you will receive a postcard with your next transmission date.  To improve our patient care and to more adequately follow your device, [facility] has decided, as a practice, to start following each patient four times a year with your home monitor. This means that you may experience a remote appointment that is close to an in-office appointment with your physician. Your insurance will apply at the same rate as other remote monitoring transmissions.  Follow-Up: At [facility], you and your health needs are our priority.  As part of our continuing  mission to provide you with exceptional heart care, we have created designated Provider Care Teams.  These Care Teams include your primary Cardiologist (physician) and Advanced Practice Providers (APPs -  Physician Assistants and Nurse Practitioners) who all work together to provide you with the care you need, when you need it. You will need a follow up appointment in 12 months. You may see Tammy Klein, MD or one of the following Advanced Practice Providers on your designated  Care Team: [place], [place] . Fabian Sharp, PA-C . You will receive a reminder letter in the mail two months in advance. If you don't receive a letter, please call our office to schedule the follow-up appointment.    Signed, Tammy Klein, MD  09/10/2018 3:50 PM    [facility] [address], [place], [place]  [address] Phone: [phone]; Fax: [phone]

## 2018-09-13 ENCOUNTER — Other Ambulatory Visit: Payer: Medicare Other

## 2018-09-20 NOTE — Progress Notes (Signed)
Subjective:    [name], female    DOB: 1940-12-27, 78 y.o.   MRN: [id number]  HPI:  [name] is here to establish as a new pt.  She is a pleasant 78 year old female. PMH:T2D, HTN, Morbid Obesity, HLD, CHB, Chronic back pain with L side sciatica She was on max daily dose of Gabapentin for chronic pain- suffered fall Oct 2019- has weaned off Gabapentin Now on Oxycodone 5mg  from [facility]  CHB treated with PPM- recently had device change out May 2019, follwed by Dr. Sallyanne Kuster.- last OV 09/08/2018- "she is to take losartan 50 mg daily and to stop irbesartan" She has not been on diabetic medication for years, per pt she was "diet controlled" She has been slowly losing weight since Fall 2019 by reducing portion sizes, current wt 187, she estimates to have lost >15 lbs since Oct 2019 She has been on supplemental O2 since Oct 2019- currently 2L She reports sats will fall into high 80s when off O2, she uses same flow rate when sleeping She was previously been treated by Dr. Wende Mott, last contact 2017 She denies current tobacco/vape/ETOH use She lives alone, however has local daughter who is quite involved in her care She ambulates with walker, denies any recent falls She reports being able to perform ADLs, IADLs and feels safe at home  Lab Results  Component Value Date   HGBA1C 7.0 (H) 07/02/2018   HGBA1C 7.3 (H) 12/07/2014   HGBA1C  11/29/2007    5.8 (NOTE)   The ADA recommends the following therapeutic goals for glycemic   control related to Hgb A1C measurement:   Goal of Therapy:   < 7.0% Hgb A1C   Action Suggested:  > 8.0% Hgb A1C   Ref:  Diabetes Care, 79, Suppl. 1, 1999    Patient Care Team    Relationship Specialty Notifications Start End  Esaw Grandchild, NP PCP - General Family Medicine  09/21/18   Sanda Klein, MD PCP - Cardiology Cardiology Admissions 09/08/18     Patient Active Problem List   Diagnosis Date Noted  . Rib fracture 07/02/2018  .  Diabetes mellitus type 2, controlled, without complications ([place]) 00/86/7619  . Acute respiratory failure with hypoxia ([place]) 07/01/2018  . Pacemaker battery depletion 05/26/2018  . Mild obesity 01/05/2016  . Chronic respiratory failure with hypoxia ([place]) 10/29/2015  . Morbid obesity ([place]) 10/29/2015  . Upper airway cough syndrome 03/04/2015  . COPD GOLD II 01/18/2015  . Pulmonary hypertension ([place])   . Dyspnea   . SOB (shortness of breath)   . Sleep apnea   . Hypoxemia   . Acute respiratory failure with hypoxemia ([place])   . Acute pulmonary edema (HCC)   . Chronic diastolic heart failure ([place]) 12/06/2014  . Accelerated hypertension with diastolic congestive heart failure, NYHA class 3 ([place]) 12/06/2014  . CHB (complete heart block) ([place]) 09/19/2013  . Pacemaker - dual chamber St Jude, 2009 09/19/2013  . Essential hypertension 09/19/2013  . Normal coronary arteries angiography 2002 09/19/2013  . Hyperlipidemia 09/19/2013  . Varicose veins of lower extremities with other complications 50/93/2671     Past Medical History:  Diagnosis Date  . Arthritis    "hands, spine" (12/07/2014)  . CHB (complete heart block) ([place]) 09/19/2013  . CHF (congestive heart failure) ([place]) dx'd 12/06/2014  . Chronic pain    "since neck OR in 2007"  . Coronary artery disease    normal coronaries by 09/10/00 cath  .  Diabetes mellitus    hga1c 7.1 no meds  . Dysrhythmia   . HTN (hypertension) 09/19/2013  . Hypercholesterolemia   . Hyperlipidemia 09/19/2013  . Hypertension   . Myocardial infarction ([place]) dx'd 2009  . Neuropathy of both feet   . Pacemaker   . Paralysis of right hand ([place]) 2007   "after spinal cord injury/OR"  . Shortness of breath    exertional  . Type II diabetes mellitus ([place])    "had it years ago; lost weight after neck OR in 2007; glucose went way down; recently dx'd as returned recently" (12/07/2014)  . Urinary incontinence    "since 2007's OR"  . Venous insufficiency (chronic)  (peripheral)      Past Surgical History:  Procedure Laterality Date  . ANTERIOR CERVICAL DECOMP/DISCECTOMY FUSION  2007  . BACK SURGERY    . CARDIAC CATHETERIZATION  09/10/2000   normal Coronary arteries, EF60%,   . CARDIAC PACEMAKER PLACEMENT  12/01/2007   St.Jude Zephyr Elta [place] #[id number], dual chamber  . CARDIOVASCULAR STRESS TEST  08/28/2000   Cardiolite perfusion study, EF58%, low risk study,   . DILATION AND CURETTAGE OF UTERUS  1960's  . KNEE ARTHROSCOPY Right   . LAPAROSCOPIC CHOLECYSTECTOMY  1995  . LEFT AND RIGHT HEART CATHETERIZATION WITH CORONARY ANGIOGRAM N/A 12/14/2014   Procedure: LEFT AND RIGHT HEART CATHETERIZATION WITH CORONARY ANGIOGRAM;  Surgeon: Peter M Martinique, MD;  Location[facility] CATH LAB;  Service: Cardiovascular;  Laterality: N/A;  . Lower Ext. Dopplers  06/12/2011   no evidence of thrombus, all vessels normal in size, no evidence of insuffiency  . LUMBAR LAMINECTOMY/DECOMPRESSION MICRODISCECTOMY  03/02/2012   Procedure: LUMBAR LAMINECTOMY/DECOMPRESSION MICRODISCECTOMY 2 LEVELS;  Surgeon: Floyce Stakes, MD;  Location: [facility] NEURO ORS;  Service: Neurosurgery;  Laterality: Bilateral;  Lumbar three, lumbar four, lumbar five Laminectomy/Foraminotomy  . NM MYOCAR PERF WALL MOTION  10/24/2008   protocol:Lexiscan, EF63%, normal study, scan negative for ischemia  . PPM GENERATOR CHANGEOUT N/A 05/26/2018   Procedure: PPM GENERATOR CHANGEOUT;  Surgeon: Sanda Klein, MD;  Location: [facility] CV LAB;  Service: Cardiovascular;  Laterality: N/A;  . TRANSTHORACIC ECHOCARDIOGRAM  11/30/2007   normal echo, mean transaortic valve gradient 30mmHg     Family History  Problem Relation Age of Onset  . Diabetes Father   . Heart disease Father        before age 15  . Hyperlipidemia Father   . Hypertension Father   . Heart attack Father   . Congestive Heart Failure Father   . Diabetes Mother   . Heart disease Mother   . Hyperlipidemia Mother   . Hypertension Mother   .  Congestive Heart Failure Mother   . Diabetes Brother   . Congestive Heart Failure Brother      Social History   Substance and Sexual Activity  Drug Use No     Social History   Substance and Sexual Activity  Alcohol Use No     Social History   Tobacco Use  Smoking Status Former Smoker  . Packs/day: 1.00  . Years: 40.00  . Pack years: 40.00  . Types: Cigarettes  . Last attempt to quit: 03/02/1992  . Years since quitting: 26.5  Smokeless Tobacco Never Used     Outpatient Encounter Medications as of 09/21/2018  Medication Sig  . acetaminophen (TYLENOL) 500 MG tablet Take 500 mg by mouth 4 (four) times daily -  with meals and at bedtime.  Marland Kitchen aspirin EC 81 MG tablet Take 81  mg by mouth daily.  . furosemide (LASIX) 40 MG tablet TAKE 1 TABLET (40 MG TOTAL) BY MOUTH DAILY. (Patient taking differently: Take 40 mg by mouth daily. )  . HYDROcodone-acetaminophen (NORCO/VICODIN) 5-325 MG tablet Take 1 tablet by mouth 2 (two) times daily.  Marland Kitchen ibuprofen (ADVIL,MOTRIN) 200 MG tablet Take 200 mg by mouth every 6 (six) hours as needed.  Marland Kitchen KLOR-CON M20 20 MEQ tablet TAKE 1 TABLET (20 MEQ TOTAL) BY MOUTH DAILY.  Marland Kitchen losartan (COZAAR) 50 MG tablet Take 1 tablet (50 mg total) by mouth daily.  . pravastatin (PRAVACHOL) 40 MG tablet Take 40 mg by mouth daily.  Marland Kitchen tiZANidine (ZANAFLEX) 2 MG tablet Take 2 mg by mouth every 6 (six) hours as needed for muscle spasms.  . metFORMIN (GLUCOPHAGE) 500 MG tablet 1/2 tablet nightly with dinner for two weeks, then one tablet with dinner.  . [DISCONTINUED] oxyCODONE (OXY IR/ROXICODONE) 5 MG immediate release tablet Take 1 tablet (5 mg total) by mouth every 4 (four) hours as needed for moderate pain or breakthrough pain.   No facility-administered encounter medications on file as of 09/21/2018.     Allergies: Patient has no known allergies.  Body mass index is 29.32 kg/m.  Blood pressure (!) 146/76, pulse 81, temperature 98.6 F (37 C), temperature source  Oral, height 5\' 7"  (1.702 m), weight 187 lb 3.2 oz (84.9 kg), SpO2 90 %.     Review of Systems  Constitutional: Positive for activity change and fatigue. Negative for appetite change, chills, diaphoresis and fever.  HENT: Negative for congestion.   Eyes: Negative for visual disturbance.  Respiratory: Positive for shortness of breath. Negative for cough, chest tightness, wheezing and stridor.   Cardiovascular: Positive for leg swelling. Negative for chest pain and palpitations.  Gastrointestinal: Negative for abdominal distention, abdominal pain, blood in stool, constipation, diarrhea, nausea and vomiting.  Endocrine: Negative for cold intolerance, heat intolerance, polydipsia, polyphagia and polyuria.  Genitourinary: Negative for difficulty urinating and enuresis.  Musculoskeletal: Positive for arthralgias, back pain, gait problem, joint swelling and myalgias. Negative for neck pain and neck stiffness.  Skin: Negative for color change, pallor, rash and wound.  Neurological: Negative for dizziness and headaches.  Psychiatric/Behavioral: Positive for sleep disturbance. Negative for agitation, behavioral problems, confusion, decreased concentration, dysphoric mood, hallucinations, self-injury and suicidal ideas. The patient is not nervous/anxious and is not hyperactive.        Objective:   Physical Exam Vitals signs and nursing note reviewed.  Constitutional:      General: She is not in acute distress.    Appearance: She is obese. She is not ill-appearing, toxic-appearing or diaphoretic.  Cardiovascular:     Rate and Rhythm: Normal rate.     Pulses: Normal pulses.     Heart sounds: Normal heart sounds.  Skin:    General: Skin is warm.     Capillary Refill: Capillary refill takes less than 2 seconds.  Neurological:     Mental Status: She is alert and oriented to person, place, and time.  Psychiatric:        Mood and Affect: Mood normal.        Behavior: Behavior normal.         Thought Content: Thought content normal.        Judgment: Judgment normal.       Assessment & Plan:   1. Dyspnea, unspecified type   2. Dependence on continuous supplemental oxygen   3. Essential hypertension   4. Hyperlipidemia, unspecified hyperlipidemia  type   5. Controlled type 2 diabetes mellitus without complication, without long-term current use of insulin ([place])   6. Fatigue, unspecified type   7. Healthcare maintenance   8. CHB (complete heart block) (HCC)   9. Pulmonary hypertension ([place])   10. SOB (shortness of breath)     Diabetes mellitus type 2, controlled, without complications (HCC) Lab Results  Component Value Date   HGBA1C 7.0 (H) 07/02/2018   HGBA1C 7.3 (H) 12/07/2014   HGBA1C  11/29/2007    5.8 (NOTE)   The ADA recommends the following therapeutic goals for glycemic   control related to Hgb A1C measurement:   Goal of Therapy:   < 7.0% Hgb A1C   Action Suggested:  > 8.0% Hgb A1C   Ref:  Diabetes Care, 22, Suppl. 1, 1999   Continue all medications as directed. Please start Metformin 500mg -1/2 tablet with dinner for two weeks, then increase to one tablet nightly with dinner- hold at this dose. Follow-up in one month.  Essential hypertension BP 146/76, HR 81 Continue Losartan 50mg  QD, Furosemide 40mg  QD Followed by cards/Dr. Sallyanne Kuster- last OV 09/08/2018   CHB (complete heart block) (HCC) New PPM placed May 2019 She denies acute cardiac sx's  Hyperlipidemia Currently on Pravastatin 40mg  QD, denies myalia's  SOB (shortness of breath) Has been on 2L since hospitalization Oct 2019 She reports O2 sat will drop to high 80s if she comes off supplemental O2 Referral placed to Pulm, remain on supplemental O2 until evaluated by Pulm    FOLLOW-UP:  Return in about 4 weeks (around 10/19/2018) for Regular Follow Up, HTN, Diabetes.

## 2018-09-21 ENCOUNTER — Encounter: Payer: Self-pay | Admitting: Adult Health

## 2018-09-21 ENCOUNTER — Ambulatory Visit (INDEPENDENT_AMBULATORY_CARE_PROVIDER_SITE_OTHER): Payer: Medicare Other | Admitting: Adult Health

## 2018-09-21 VITALS — BP 146/76 | HR 81 | Temp 98.6°F | Ht 67.0 in | Wt 187.2 lb

## 2018-09-21 DIAGNOSIS — E785 Hyperlipidemia, unspecified: Secondary | ICD-10-CM

## 2018-09-21 DIAGNOSIS — R0600 Dyspnea, unspecified: Secondary | ICD-10-CM | POA: Diagnosis not present

## 2018-09-21 DIAGNOSIS — I10 Essential (primary) hypertension: Secondary | ICD-10-CM | POA: Diagnosis not present

## 2018-09-21 DIAGNOSIS — R0602 Shortness of breath: Secondary | ICD-10-CM | POA: Diagnosis not present

## 2018-09-21 DIAGNOSIS — E119 Type 2 diabetes mellitus without complications: Secondary | ICD-10-CM | POA: Diagnosis not present

## 2018-09-21 DIAGNOSIS — Z0000 Encounter for general adult medical examination without abnormal findings: Secondary | ICD-10-CM

## 2018-09-21 DIAGNOSIS — Z9981 Dependence on supplemental oxygen: Secondary | ICD-10-CM

## 2018-09-21 DIAGNOSIS — R5383 Other fatigue: Secondary | ICD-10-CM

## 2018-09-21 DIAGNOSIS — I442 Atrioventricular block, complete: Secondary | ICD-10-CM | POA: Diagnosis not present

## 2018-09-21 DIAGNOSIS — I272 Pulmonary hypertension, unspecified: Secondary | ICD-10-CM | POA: Diagnosis not present

## 2018-09-21 MED ORDER — METFORMIN HCL 500 MG PO TABS: ORAL_TABLET | ORAL | 0 refills | Status: DC

## 2018-09-21 NOTE — Assessment & Plan Note (Addendum)
BP 146/76, HR 81 Continue Losartan 50mg  QD, Furosemide 40mg  QD Followed by cards/Dr. Sallyanne Kuster- last OV 09/08/2018

## 2018-09-21 NOTE — Assessment & Plan Note (Signed)
Currently on Pravastatin 40mg  QD, denies myalia's

## 2018-09-21 NOTE — Patient Instructions (Addendum)

## 2018-09-21 NOTE — Assessment & Plan Note (Signed)
New PPM placed May 2019 She denies acute cardiac sx's

## 2018-09-21 NOTE — Assessment & Plan Note (Signed)
Lab Results  Component Value Date   HGBA1C 7.0 (H) 07/02/2018   HGBA1C 7.3 (H) 12/07/2014   HGBA1C  11/29/2007    5.8 (NOTE)   The ADA recommends the following therapeutic goals for glycemic   control related to Hgb A1C measurement:   Goal of Therapy:   < 7.0% Hgb A1C   Action Suggested:  > 8.0% Hgb A1C   Ref:  Diabetes Care, 22, Suppl. 1, 1999   Continue all medications as directed. Please start Metformin 500mg -1/2 tablet with dinner for two weeks, then increase to one tablet nightly with dinner- hold at this dose. Follow-up in one month.

## 2018-09-21 NOTE — Assessment & Plan Note (Signed)
Has been on 2L since hospitalization Oct 2019 She reports O2 sat will drop to high 80s if she comes off supplemental O2 Referral placed to Pulm, remain on supplemental O2 until evaluated by [facility]

## 2018-09-21 NOTE — Assessment & Plan Note (Signed)
>>  ASSESSMENT AND PLAN FOR HYPERLIPIDEMIA WRITTEN ON 09/21/2018  5:13 PM BY DANFORD, KATY D, NP  Currently on Pravastatin 40mg  QD, denies myalia's

## 2018-09-21 NOTE — Assessment & Plan Note (Signed)
>>  ASSESSMENT AND PLAN FOR ESSENTIAL HYPERTENSION WRITTEN ON 09/22/2018  7:56 AM BY DANFORD, KATY D, NP  BP 146/76, HR 81 Continue Losartan 50mg  QD, Furosemide 40mg  QD Followed by cards/Dr. Royann Shivers- last OV 09/08/2018

## 2018-09-29 DIAGNOSIS — L821 Other seborrheic keratosis: Secondary | ICD-10-CM | POA: Diagnosis not present

## 2018-10-01 LAB — CUP PACEART INCLINIC DEVICE CHECK
Date Time Interrogation Session: 20200131153756
Implantable Lead Implant Date: 20090401
Implantable Lead Location: 753860
MDC IDC LEAD IMPLANT DT: 20090401
MDC IDC LEAD LOCATION: 753859
MDC IDC PG IMPLANT DT: 20190925
MDC IDC PG SERIAL: 9064942

## 2018-10-04 DIAGNOSIS — M5416 Radiculopathy, lumbar region: Secondary | ICD-10-CM | POA: Diagnosis not present

## 2018-10-05 LAB — CUP PACEART INCLINIC DEVICE CHECK
Implantable Lead Implant Date: 20090401
Implantable Lead Implant Date: 20090401
Implantable Lead Location: 753860
MDC IDC LEAD LOCATION: 753859
MDC IDC PG IMPLANT DT: 20190925
MDC IDC SESS DTM: 20200204164336
Pulse Gen Model: 2272
Pulse Gen Serial Number: 9064942

## 2018-10-11 DIAGNOSIS — M5416 Radiculopathy, lumbar region: Secondary | ICD-10-CM | POA: Diagnosis not present

## 2018-10-12 DIAGNOSIS — M5416 Radiculopathy, lumbar region: Secondary | ICD-10-CM | POA: Diagnosis not present

## 2018-10-13 ENCOUNTER — Encounter: Payer: Self-pay | Admitting: Internal Medicine

## 2018-10-13 ENCOUNTER — Ambulatory Visit (INDEPENDENT_AMBULATORY_CARE_PROVIDER_SITE_OTHER): Payer: Medicare Other | Admitting: Internal Medicine

## 2018-10-13 ENCOUNTER — Ambulatory Visit (INDEPENDENT_AMBULATORY_CARE_PROVIDER_SITE_OTHER)
Admission: RE | Admit: 2018-10-13 | Discharge: 2018-10-13 | Disposition: A | Discharge status: Home / Self Care | Payer: Medicare Other | Source: Ambulatory Visit | Attending: Internal Medicine | Admitting: Internal Medicine

## 2018-10-13 VITALS — BP 130/78 | HR 85 | Ht 64.0 in | Wt 185.2 lb

## 2018-10-13 DIAGNOSIS — R058 Other specified cough: Secondary | ICD-10-CM

## 2018-10-13 DIAGNOSIS — R0602 Shortness of breath: Secondary | ICD-10-CM | POA: Diagnosis not present

## 2018-10-13 DIAGNOSIS — R0609 Other forms of dyspnea: Secondary | ICD-10-CM

## 2018-10-13 DIAGNOSIS — J449 Chronic obstructive pulmonary disease, unspecified: Secondary | ICD-10-CM

## 2018-10-13 DIAGNOSIS — R05 Cough: Secondary | ICD-10-CM | POA: Diagnosis not present

## 2018-10-13 DIAGNOSIS — J9611 Chronic respiratory failure with hypoxia: Secondary | ICD-10-CM | POA: Diagnosis not present

## 2018-10-13 NOTE — Patient Instructions (Addendum)
Try prilosec otc 20mg   Take 30-60 min before first meal of the day and Pepcid ac (famotidine) 20 mg one @  bedtime until  Return  Use Incentive spirometer as much as possible and minimize sedating meds   GERD (REFLUX)  is an extremely common cause of respiratory symptoms just like yours , many times with no obvious heartburn at all.    It can be treated with medication, but also with lifestyle changes including elevation of the head of your bed (ideally with 6 -8inch blocks under the headboard of your bed),  Smoking cessation, avoidance of late meals, excessive alcohol, and avoid fatty foods, chocolate, peppermint, colas, red wine, and acidic juices such as orange juice.  NO MINT OR MENTHOL PRODUCTS SO NO COUGH DROPS  USE SUGARLESS CANDY INSTEAD (Jolley ranchers or Stover's or Life Savers) or even ice chips will also do - the key is to swallow to prevent all throat clearing. NO OIL BASED VITAMINS - use powdered substitutes.  Avoid fish oil when coughing.     Keep on 02 at bedtime and for sats over 90% daytime adjust 02 up down or off  Please remember to go to the lab and x-ray department   for your tests - we will call you with the results when they are available.       Please schedule a follow up office visit in 4 weeks, sooner if needed  with all medications /inhalers/ solutions/devices  in hand so we can verify exactly what you are taking. This includes all medications from all doctors and over the counters pfts when return

## 2018-10-13 NOTE — Progress Notes (Signed)
Subjective:   [name], female    DOB: 06-Apr-1941     MRN: [id number]    Brief patient profile:  13 yowf quit smoking around 1990 with documented GOLD II copd 12/2014  with no resp problems until spring of 2016 > admit cc progressively SOB x two weeks with orthopnea, PND and swelling in her abdomen. Some coughing which is worse when lying down s sign peripheral edema:   Admit date: 12/06/2014 Discharge date: 12/15/2014  PCP: Simona Huh, MD Primary Cardiologist: Dr. Sallyanne Kuster  Primary Discharge Diagnosis: Accelerated hypertension with diastolic congestive heart failure, NYHA class 3 - weight 206 pounds at discharge  Secondary Discharge Diagnosis:   CHB (complete heart block)   Pacemaker - dual chamber St Jude, 2009  Essential hypertension  Normal coronary arteries angiography 2002  Hyperlipidemia  Acute congestive heart failure  Congestive heart disease  Dyspnea  SOB (shortness of breath)  Sleep apnea  Hypoxemia  Acute respiratory failure with hypoxemia  Acute pulmonary edema  Pulmonary hypertension    01/18/2015 1st [facility] office visit/ [name] / referred by Cards/ [name] re copd/02 desp resp failure   Chief Complaint  Patient presents with  . Pulmonary Consult    Referred by Tammy Rota, PA for eval of Hypoxia. Pt c/o SOB "all the time" for the past 3 wks.  She has been clearing her throat alot for the past couple wks.   new doe x 3 steps but not wearing 02 / steps were  not a problem before her admit (confirmed only sob x 2 weeks pre-admit)  Also new sob talking and lump in throat / dysyphagia since discharge > that was not present prior to d/c on ACEi new start assoc with dry daytime cough/ urge to clear throat Now lying flat at hs ok on 02 comfortably  but confused as to how to use 02 sitting/walking rec Only use the 02 at bedtime and keep a portable system handy for as needed use but at this point your  walking is not limited by lack of 02  Stop lisinopril and your breathing should improve back to where it was previously  Start valsartan 80 mg daily in place of lisinopril Please schedule a follow up office visit in 6 weeks, call sooner if needed and we'll re-evaluate your needs then    03/01/2015 f/u ov/[name] re:  Noct 02 dep/GOLD II  copd/ uacs ? How much acei related off x 6 weeks  Chief Complaint  Patient presents with  . Follow-up    Pt states that her breathing has improved some. She states she has only had to use her o2 a few times during the day. No new co's today.   walking at home is better but not like it was due to limiting sob  But also back and knees limit her  Was able to do some house work but can't do sweeper s doe/ sats ok  Sleeping on 02 2lpm flat Taking gabapentin 300 x 2  = 6 per day  / still frog in throat daytime but not as bad as while on ACEi  rec Try prilosec 20mg   Take 30-60 min before first meal of the day and Pepcid (famotidine) 20 mg one at bedtime until frog in throat  is completely gone for at least a week  GERD diet  If breathing getting worse or cough worse return right away    10/29/2015  f/u ov/[name] re: GOLD II copd/  obesity / noct 02 only  And no maint  resp rx / did not follow gerd rx or diet - using mineral oil  Chief Complaint  Patient presents with  . Follow-up    Breathing is doing well. She is here to recertify for o2. No new co's today.   push buggy with HT no 02 just 2lpm at hs  Still with constant sensation of throat mucus day >> noct  rec Try prilosec 20mg   Take 30-60 min before first meal of the day and Pepcid (famotidine) 20 mg one at bedtime until frog in throat  is completely gone for at least a week  GERD diet  Please see patient coordinator before you leave today  to schedule overnight oxygen levels on Room air  Pulmonary follow up is as needed if breathing gets worse - if hoarseness not better on the above plan rec ENT eval next       10/13/2018  f/u ov/[name] re:  Chief Complaint  Patient presents with  . Acute Visit    Had a fall with rib fx on 07/01/18 and has been having SOB since then.  She was hospitalized then and was placed on o2 24/7.   Dyspnea:  Ever since fell 07/01/18 /walking 37 ft and stops with 02 at 2lpm slow pace no desats per fm   Cough: none / still clearing throat a lot an never saw ent as rec  Sleeping: bed flat /2 pillows  SABA use: none 02: 2lpm hs and prn daytime    No obvious day to day or daytime variability or assoc excess/ purulent sputum or mucus plugs or hemoptysis or cp or chest tightness, subjective wheeze or overt sinus or hb symptoms.   Sleeping as above without nocturnal  or early am exacerbation  of respiratory  c/o's or need for noct saba. Also denies any obvious fluctuation of symptoms with weather or environmental changes or other aggravating or alleviating factors except as outlined above   No unusual exposure hx or h/o childhood pna/ asthma or knowledge of premature birth.  Current Allergies, Complete Past Medical History, Past Surgical History, Family History, and Social History were reviewed in [employer] record.  ROS  The following are not active complaints unless bolded Hoarseness, sore throat, dysphagia, dental problems, itching, sneezing,  nasal congestion or discharge of excess mucus or purulent secretions, ear ache,   fever, chills, sweats, unintended wt loss or wt gain, classically pleuritic or exertional cp,  orthopnea pnd or arm/hand swelling  or leg swelling, presyncope, palpitations, abdominal pain, anorexia, nausea, vomiting, diarrhea  or change in bowel habits or change in bladder habits, change in stools or change in urine, dysuria, hematuria,  rash, arthralgias, visual complaints, headache, numbness, weakness or ataxia or problems with walking or coordination,  change in mood or  memory.        Current Meds  Medication Sig  .  acetaminophen (TYLENOL) 500 MG tablet Take 500 mg by mouth 4 (four) times daily -  with meals and at bedtime.  Marland Kitchen aspirin EC 81 MG tablet Take 81 mg by mouth daily.  . furosemide (LASIX) 40 MG tablet TAKE 1 TABLET (40 MG TOTAL) BY MOUTH DAILY. (Patient taking differently: Take 40 mg by mouth daily. )  . ibuprofen (ADVIL,MOTRIN) 200 MG tablet Take 200 mg by mouth every 6 (six) hours as needed.  Marland Kitchen KLOR-CON M20 20 MEQ tablet TAKE 1 TABLET (20 MEQ TOTAL) BY MOUTH DAILY.  Marland Kitchen losartan (  COZAAR) 50 MG tablet Take 1 tablet (50 mg total) by mouth daily.  . metFORMIN (GLUCOPHAGE) 500 MG tablet 1/2 tablet nightly with dinner for two weeks, then one tablet with dinner.  . pravastatin (PRAVACHOL) 40 MG tablet Take 40 mg by mouth daily.  Marland Kitchen tiZANidine (ZANAFLEX) 2 MG tablet Take 2 mg by mouth every 6 (six) hours as needed for muscle spasms.  . traMADol (ULTRAM) 50 MG tablet Take 1 tablet by mouth every 6 (six) hours as needed.             Objective:   Physical Exam   amb wf rollator   03/01/2015        198 > 10/29/2015  200 > 10/13/2018    185     01/18/15 202 lb (91.627 kg)  01/10/15 197 lb (89.359 kg)  01/09/15 203 lb 1.6 oz (92.126 kg)    Vital signs reviewed - Note on arrival 02 sats  94% on 2lpm continuous     Obese hoarse wf hard of hearing   HEENT: edentulous/ nl turbinates bilaterally, and oropharynx. Nl external ear canals without cough reflex   NECK :  without JVD/Nodes/TM/ nl carotid upstrokes bilaterally   LUNGS: no acc muscle use,  Nl contour chest which is clear to A and P bilaterally without cough on insp or exp maneuvers   CV:  RRR  no s3 or murmur or increase in P2, and no edema   ABD:  Obese soft and nontender with limited inspiratory excursion . No bruits or organomegaly appreciated, bowel sounds nl  MS:    ext warm without deformities, calf tenderness, cyanosis or clubbing No obvious joint restrictions   SKIN: warm and dry without lesions    NEURO:  alert, approp, nl  sensorium with  no motor or cerebellar deficits apparent.         CXR PA and Lateral:   10/13/2018 :    I personally reviewed images and  impression as follows:    No active disease.  Cardiomegaly.  Pacemaker.   Labs ordered/ reviewed:      Chemistry      Component Value Date/Time   NA 137 10/13/2018 1629   K 3.7 10/13/2018 1629   CL 95 (L) 10/13/2018 1629   CO2 34 (H) 10/13/2018 1629   BUN 21 10/13/2018 1629   CREATININE 0.75 10/13/2018 1629      Component Value Date/Time   CALCIUM 9.2 10/13/2018 1629        Lab Results  Component Value Date   WBC 15.2 (H) 10/13/2018   HGB 11.9 (L) 10/13/2018   HCT 35.7 (L) 10/13/2018   MCV 91.3 10/13/2018   PLT 225.0 10/13/2018       EOS                                                               0.0                                    10/13/2018     Lab Results  Component Value Date   TSH 0.89 10/13/2018     Lab Results  Component Value Date   PROBNP 577.0 (H) 10/13/2018  Assessment & Plan:

## 2018-10-14 LAB — RESPIRATORY ALLERGY PROFILE REGION II ~~LOC~~
Allergen, A. alternata, m6: <0.1 kU/L
Allergen, Cedar tree, t12: <0.1 kU/L
Allergen, Comm Silver Birch, t9: <0.1 kU/L
Allergen, Cottonwood, t14: <0.1 kU/L
Allergen, Mouse Urine Protein, e78: <0.1 kU/L
Allergen, Mulberry, t76: <0.1 kU/L
Allergen, Oak,t7: <0.1 kU/L
Allergen, P. notatum, m1: <0.1 kU/L
Aspergillus fumigatus, m3: <0.1 kU/L
Bermuda Grass: <0.1 kU/L
Box Elder IgE: <0.1 kU/L
CLADOSPORIUM HERBARUM (M2) IGE: <0.1 kU/L
CLASS: 0
CLASS: 0
CLASS: 0
COMMON RAGWEED (SHORT) (W1) IGE: <0.1 kU/L
Cat Dander: <0.1 kU/L
Class: 0
Class: 0
Class: 0
Class: 0
Class: 0
Class: 0
Class: 0
Class: 0
Class: 0
Class: 0
Class: 0
Class: 0
Class: 0
Class: 0
Class: 0
Class: 0
Class: 0
Class: 0
Class: 0
Class: 0
Class: 0
Cockroach: <0.1 kU/L
Dog Dander: <0.1 kU/L
Elm IgE: <0.1 kU/L
IgE (Immunoglobulin E), Serum: 29 kU/L (ref <=114)
Johnson Grass: <0.1 kU/L
Pecan/Hickory Tree IgE: <0.1 kU/L
Rough Pigweed  IgE: <0.1 kU/L
Sheep Sorrel IgE: <0.1 kU/L
Timothy Grass: <0.1 kU/L

## 2018-10-14 LAB — CBC WITH DIFFERENTIAL/PLATELET
Basophils Absolute: 0 10*3/uL (ref 0.0–0.1)
Basophils Relative: 0.3 % (ref 0.0–3.0)
Eosinophils Absolute: 0 10*3/uL (ref 0.0–0.7)
Eosinophils Relative: 0.1 % (ref 0.0–5.0)
HCT: 35.7 % — ABNORMAL LOW (ref 36.0–46.0)
Hemoglobin: 11.9 g/dL — ABNORMAL LOW (ref 12.0–15.0)
Lymphocytes Relative: 15 % (ref 12.0–46.0)
Lymphs Abs: 2.3 10*3/uL (ref 0.7–4.0)
MCHC: 33.3 g/dL (ref 30.0–36.0)
MCV: 91.3 fl (ref 78.0–100.0)
MONOS PCT: 5.1 % (ref 3.0–12.0)
Monocytes Absolute: 0.8 10*3/uL (ref 0.1–1.0)
NEUTROS PCT: 79.5 % — AB (ref 43.0–77.0)
Neutro Abs: 12.1 10*3/uL — ABNORMAL HIGH (ref 1.4–7.7)
PLATELETS: 225 10*3/uL (ref 150.0–400.0)
RBC: 3.91 Mil/uL (ref 3.87–5.11)
RDW: 15.1 % (ref 11.5–15.5)
WBC: 15.2 10*3/uL — ABNORMAL HIGH (ref 4.0–10.5)

## 2018-10-14 LAB — INTERPRETATION:

## 2018-10-14 LAB — TSH: TSH: 0.89 u[IU]/mL (ref 0.35–4.50)

## 2018-10-14 LAB — BASIC METABOLIC PANEL
BUN: 21 mg/dL (ref 6–23)
CO2: 34 mEq/L — ABNORMAL HIGH (ref 19–32)
Calcium: 9.2 mg/dL (ref 8.4–10.5)
Chloride: 95 mEq/L — ABNORMAL LOW (ref 96–112)
Creatinine, Ser: 0.75 mg/dL (ref 0.40–1.20)
GFR: 74.51 mL/min (ref >60.00)
Glucose, Bld: 126 mg/dL — ABNORMAL HIGH (ref 70–99)
Potassium: 3.7 mEq/L (ref 3.5–5.1)
Sodium: 137 mEq/L (ref 135–145)

## 2018-10-14 LAB — BRAIN NATRIURETIC PEPTIDE: Pro B Natriuretic peptide (BNP): 577 pg/mL — ABNORMAL HIGH (ref 0.0–100.0)

## 2018-10-14 NOTE — Assessment & Plan Note (Addendum)
Trial off acei 01/18/15 > marginally improved sob/ throat lump - trial of max gerd rx 10/29/2015 > ent eval rec but never done   rec resume max gerd rx x 4 weeks then regroup/ avoid dpi inhalers in this setting     I had an extended discussion with the patient and fm reviewing all relevant studies completed to date and  lasting 25 minutes of a 40  minute office visit to re-establish p last ov x almost 3 y prior re  severe non-specific but potentially very serious refractory respiratory symptoms of uncertain and potentially multiple  etiologies.   Each maintenance medication was reviewed in detail including most importantly the difference between maintenance and prns and under what circumstances the prns are to be triggered using an action plan format that is not reflected in the computer generated alphabetically organized AVS.    Please see AVS for specific instructions unique to this office visit that I personally wrote and verbalized to the the pt in detail and then reviewed with pt  by my nurse highlighting any changes in therapy/plan of care  recommended at today's visit.

## 2018-10-14 NOTE — Assessment & Plan Note (Signed)
Noct 02 2lpm since admit 12/2014  - 10/29/2015   Walked RA x one lap @ 185 stopped due to  Weak leg no sob, sat still 89%  - ONO  11/01/15 RA >  No desat - as of 10/13/2018 using 2lpm hs and with activity   Ok to continue hs but titrate daytme to keep > 90% - ? If even needed at rest

## 2018-10-14 NOTE — Assessment & Plan Note (Addendum)
Onset spring 2016 - Note PCWP 24 at cath 12/14/14  - Trial off acei 01/18/2015 > marginally improved 02/29/16  - 01/18/2015   Walked RA x one lap @ 185 stopped due to  Sob but no desat, slow to nl  Pace - 03/01/2015   Walked RA x one lap @ 185 stopped due to  fatiue > sob/ no desat/ moderate pace    Symptoms are markedly disproportionate to objective findings and not clear to what extent this is actually a pulmonary  problem but pt does appear to have difficult to sort out respiratory symptoms of unknown origin for which  DDX  = almost all start with A and  include Adherence, Ace Inhibitors, Acid Reflux, Active Sinus Disease, Alpha 1 Antitripsin deficiency, Anxiety masquerading as Airways dz,  ABPA,  Allergy(esp in young), Aspiration (esp in elderly), Adverse effects of meds,  Active smoking or Vaping, A bunch of PE's/clot burden (a few small clots can't cause this syndrome unless there is already severe underlying pulm or vascular dz with poor reserve),  Anemia or thyroid disorder, plus two Bs  = Bronchiectasis and Beta blocker use..and one C= CHF      Adherence is always the initial "prime suspect" and is a multilayered concern that requires a "trust but verify" approach in every patient - starting with knowing how to use medications, especially inhalers, correctly, keeping up with refills and understanding the fundamental difference between maintenance and prns vs those medications only taken for a very short course and then stopped and not refilled.  - advised to bring in IS  all meds in hand using a trust but verify approach to confirm accurate Medication  Reconciliation The principal here is that until we are certain that the  patients are doing what we've asked, it makes no sense to ask them to do more.    ? Acid (or non-acid) GERD > always difficult to exclude as up to 75% of pts in some series report no assoc GI/ Heartburn symptoms> rec max (24h)  acid suppression and diet restrictions/ reviewed  and instructions given in writing.   ?allergy/ asthma > send profile   ? Adverse drug effects > minimize narcs/ benzo's   ? Anemia / thyroid dz > ruled out today  ? chf > may have component of diastolic dysfunction with bnp > 500 noted so keep on dry side of bp/bun/creat allow

## 2018-10-14 NOTE — Assessment & Plan Note (Signed)
Quit smoking 1990 pfts 12/13/14  FEV1  1.06 (53%) ratio 57  And dlco 30 corrects to 77%  ? If copd is really limiting at this point but could try lama/laba on return with pfts baseline first - it really didn't seem to be the case previously which she was much more mobile than she is now.

## 2018-10-14 NOTE — Progress Notes (Signed)
Spoke with pt and notified of results per Dr. Wert. Pt verbalized understanding and denied any questions. 

## 2018-10-15 NOTE — Progress Notes (Signed)
Spoke with pt and notified of results per Dr. Wert. Pt verbalized understanding and denied any questions. 

## 2018-10-16 ENCOUNTER — Encounter: Payer: Self-pay | Admitting: Internal Medicine

## 2018-10-16 NOTE — Progress Notes (Signed)
Subjective:    [name], female    DOB: 1941-07-19, 78 y.o.   MRN: [id number]  HPI: 09/21/2018 OV:  [name] is here to establish as a new pt.  She is a pleasant 78 year old female. PMH:T2D, HTN, Morbid Obesity, HLD, CHB, Chronic back pain with L side sciatica She was on max daily dose of Gabapentin for chronic pain- suffered fall Oct 2019- has weaned off Gabapentin Now on Oxycodone 5mg  from [facility]  CHB treated with PPM- recently had device change out May 2019, follwed by Dr. Sallyanne Kuster.- last OV 09/08/2018- "she is to take losartan 50 mg daily and to stop irbesartan" She has not been on diabetic medication for years, per pt she was "diet controlled" She has been slowly losing weight since Fall 2019 by reducing portion sizes, current wt 187, she estimates to have lost >15 lbs since Oct 2019 She has been on supplemental O2 since Oct 2019- currently 2L She reports sats will fall into high 80s when off O2, she uses same flow rate when sleeping She was previously been treated by Dr. Wende Mott, last contact 2017 She denies current tobacco/vape/ETOH use She lives alone, however has local daughter who is quite involved in her care She ambulates with walker, denies any recent falls She reports being able to perform ADLs, IADLs and feels safe at home  Lab Results  Component Value Date   HGBA1C 6.4 (A) 10/20/2018   HGBA1C 7.0 (H) 07/02/2018   HGBA1C 7.3 (H) 12/07/2014   10/20/2018 OV: [name] presents for f/u: T2D, HTN She has titrated up on Metformin- currently taking 1/2 500mg  tab with dinner Lab Results  Component Value Date   HGBA1C 6.4 (A) 10/20/2018   HGBA1C 7.0 (H) 07/02/2018   HGBA1C 7.3 (H) 12/07/2014  She reports pain clinic stopped Oxycodone 5mg  and is now on Tramadol 50mg  QD She reports pain is not well controlled and her overall anxiety level is elevated. She denies thoughts of harming herself/others  She was seen by Dr. Phillip Heal-  10/13/2018 Pt states that her breathing has improved some. She states she has only had to use her o2 a few times during the day. No new co's today.   walking at home is better but not like it was due to limiting sob  But also back and knees limit her  Was able to do some house work but can't do sweeper s doe/ sats ok  Sleeping on 02 2lpm flat Taking gabapentin 300 x 2  = 6 per day  / still frog in throat daytime but not as bad as while on ACEi  rec Try prilosec 20mg   Take 30-60 min before first meal of the day and Pepcid (famotidine) 20 mg one at bedtime until frog in throat  is completely gone for at least a week  GERD diet  If breathing getting worse or cough worse return right away  Daughter at [facility] during [place]  Patient Care Team    Relationship Specialty Notifications Start End  Esaw Grandchild, NP PCP - General Family Medicine  09/21/18   Tammy Klein, MD PCP - Cardiology Cardiology Admissions 09/08/18     Patient Active Problem List   Diagnosis Date Noted  . Healthcare maintenance 10/20/2018  . Other chronic pain 10/20/2018  . Rib fracture 07/02/2018  . Diabetes mellitus type 2, controlled, without complications ([place]) 93/26/7124  . Acute respiratory failure with hypoxia ([place]) 07/01/2018  . Pacemaker battery depletion 05/26/2018  .  Mild obesity 01/05/2016  . Chronic respiratory failure with hypoxia ([place]) 10/29/2015  . Morbid obesity ([place]) 10/29/2015  . Upper airway cough syndrome 03/04/2015  . COPD GOLD II 01/18/2015  . Pulmonary hypertension ([place])   . Dyspnea   . DOE (dyspnea on exertion)   . Sleep apnea   . Hypoxemia   . Acute respiratory failure with hypoxemia ([place])   . Acute pulmonary edema ([facility])   . Chronic diastolic heart failure ([place]) 12/06/2014  . Accelerated hypertension with diastolic congestive heart failure, NYHA class 3 ([place]) 12/06/2014  . CHB (complete heart block) ([place]) 09/19/2013  . Pacemaker - dual chamber St Jude, 2009 09/19/2013  . Essential hypertension  09/19/2013  . Normal coronary arteries angiography 2002 09/19/2013  . Hyperlipidemia 09/19/2013  . Varicose veins of lower extremities with other complications 49/44/9675     Past Medical History:  Diagnosis Date  . Arthritis    "hands, spine" (12/07/2014)  . CHB (complete heart block) ([place]) 09/19/2013  . CHF (congestive heart failure) ([place]) dx'd 12/06/2014  . Chronic pain    "since neck OR in 2007"  . Coronary artery disease    normal coronaries by 09/10/00 cath  . Diabetes mellitus    hga1c 7.1 no meds  . Dysrhythmia   . HTN (hypertension) 09/19/2013  . Hypercholesterolemia   . Hyperlipidemia 09/19/2013  . Hypertension   . Myocardial infarction ([place]) dx'd 2009  . Neuropathy of both feet   . Pacemaker   . 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 Shortness of breath    exertional  . Type II diabetes mellitus ([place])    "had it years ago; lost weight after neck OR in 2007; glucose went way down; recently dx'd as returned recently" (12/07/2014)  . Urinary incontinence    "since 2007's OR"  . Venous insufficiency (chronic) (peripheral)      Past Surgical History:  Procedure Laterality Date  . ANTERIOR CERVICAL DECOMP/DISCECTOMY FUSION  2007  . BACK SURGERY    . CARDIAC CATHETERIZATION  09/10/2000   normal Coronary arteries, EF60%,   . CARDIAC PACEMAKER PLACEMENT  12/01/2007   St.Jude Zephyr Elta [place] #[id number], dual chamber  . CARDIOVASCULAR STRESS TEST  08/28/2000   Cardiolite perfusion study, EF58%, low risk study,   . DILATION AND CURETTAGE OF UTERUS  1960's  . KNEE ARTHROSCOPY Right   . LAPAROSCOPIC CHOLECYSTECTOMY  1995  . LEFT AND RIGHT HEART CATHETERIZATION WITH CORONARY ANGIOGRAM N/A 12/14/2014   Procedure: LEFT AND RIGHT HEART CATHETERIZATION WITH CORONARY ANGIOGRAM;  Surgeon: Peter M Martinique, MD;  Location: [facility] CATH LAB;  Service: Cardiovascular;  Laterality: N/A;  . Lower Ext. Dopplers  06/12/2011   no evidence of thrombus, all vessels normal in  size, no evidence of insuffiency  . LUMBAR LAMINECTOMY/DECOMPRESSION MICRODISCECTOMY  03/02/2012   Procedure: LUMBAR LAMINECTOMY/DECOMPRESSION MICRODISCECTOMY 2 LEVELS;  Surgeon: Floyce Stakes, MD;  Location: [facility] NEURO ORS;  Service: Neurosurgery;  Laterality: Bilateral;  Lumbar three, lumbar four, lumbar five Laminectomy/Foraminotomy  . NM MYOCAR PERF WALL MOTION  10/24/2008   protocol:Lexiscan, EF63%, normal study, scan negative for ischemia  . PPM GENERATOR CHANGEOUT N/A 05/26/2018   Procedure: PPM GENERATOR CHANGEOUT;  Surgeon: Tammy Klein, MD;  Location: [facility] CV LAB;  Service: Cardiovascular;  Laterality: N/A;  . TRANSTHORACIC ECHOCARDIOGRAM  11/30/2007   normal echo, mean transaortic valve gradient 77mmHg     Family History  Problem Relation Age of Onset  . Diabetes Father   .  Heart disease Father        before age 15  . Hyperlipidemia Father   . Hypertension Father   . Heart attack Father   . Congestive Heart Failure Father   . Diabetes Mother   . Heart disease Mother   . Hyperlipidemia Mother   . Hypertension Mother   . Congestive Heart Failure Mother   . Diabetes Brother   . Congestive Heart Failure Brother      Social History   Substance and Sexual Activity  Drug Use No     Social History   Substance and Sexual Activity  Alcohol Use No     Social History   Tobacco Use  Smoking Status Former Smoker  . Packs/day: 1.00  . Years: 40.00  . Pack years: 40.00  . Types: Cigarettes  . Last attempt to quit: 03/02/1992  . Years since quitting: 26.6  Smokeless Tobacco Never Used     Outpatient Encounter Medications as of 10/20/2018  Medication Sig  . acetaminophen (TYLENOL) 500 MG tablet Take 500 mg by mouth 4 (four) times daily -  with meals and at bedtime.  Marland Kitchen aspirin EC 81 MG tablet Take 81 mg by mouth daily.  . famotidine (PEPCID) 20 MG tablet Take 20 mg by mouth at bedtime.  . furosemide (LASIX) 40 MG tablet TAKE 1 TABLET (40 MG TOTAL) BY MOUTH  DAILY. (Patient taking differently: Take 40 mg by mouth daily. )  . ibuprofen (ADVIL,MOTRIN) 200 MG tablet Take 200 mg by mouth every 6 (six) hours as needed.  Marland Kitchen KLOR-CON M20 20 MEQ tablet TAKE 1 TABLET (20 MEQ TOTAL) BY MOUTH DAILY.  Marland Kitchen losartan (COZAAR) 50 MG tablet Take 1 tablet (50 mg total) by mouth daily.  . metFORMIN (GLUCOPHAGE) 500 MG tablet 1/2 tablet nightly with dinner  . omeprazole (PRILOSEC) 20 MG capsule Take 20 mg by mouth every morning.  . pravastatin (PRAVACHOL) 40 MG tablet Take 40 mg by mouth daily.  Marland Kitchen tiZANidine (ZANAFLEX) 2 MG tablet Take 2 mg by mouth every 6 (six) hours as needed for muscle spasms.  . traMADol (ULTRAM) 50 MG tablet Take 1 tablet by mouth every 6 (six) hours as needed.  . [DISCONTINUED] metFORMIN (GLUCOPHAGE) 500 MG tablet 1/2 tablet nightly with dinner for two weeks, then one tablet with dinner.  . DULoxetine (CYMBALTA) 20 MG capsule Take 1 capsule (20 mg total) by mouth daily.  . [DISCONTINUED] omeprazole (PRILOSEC) 40 MG capsule Take 40 mg by mouth daily.   No facility-administered encounter medications on file as of 10/20/2018.     Allergies: Patient has no known allergies.  Body mass index is 31.91 kg/m.  Blood pressure 130/73, pulse 92, temperature 98 F (36.7 C), temperature source Oral, height 5\' 4"  (1.626 m), weight 185 lb 14.4 oz (84.3 kg), SpO2 93 %.  Review of Systems  Constitutional: Positive for activity change and fatigue. Negative for appetite change, chills, diaphoresis and fever.  HENT: Negative for congestion.   Eyes: Negative for visual disturbance.  Respiratory: Positive for shortness of breath. Negative for cough, chest tightness, wheezing and stridor.   Cardiovascular: Positive for leg swelling. Negative for chest pain and palpitations.  Gastrointestinal: Negative for abdominal distention, abdominal pain, blood in stool, constipation, diarrhea, nausea and vomiting.  Endocrine: Negative for cold intolerance, heat intolerance,  polydipsia, polyphagia and polyuria.  Genitourinary: Negative for difficulty urinating and enuresis.  Musculoskeletal: Positive for arthralgias, back pain, gait problem, joint swelling and myalgias. Negative for neck pain and neck  stiffness.  Skin: Negative for color change, pallor, rash and wound.  Neurological: Negative for dizziness and headaches.  Psychiatric/Behavioral: Positive for sleep disturbance. Negative for agitation, behavioral problems, confusion, decreased concentration, dysphoric mood, hallucinations, self-injury and suicidal ideas. The patient is not nervous/anxious and is not hyperactive.        Objective:   Physical Exam Vitals signs and nursing note reviewed.  Constitutional:      General: She is not in acute distress.    Appearance: She is obese. She is not ill-appearing, toxic-appearing or diaphoretic.  Cardiovascular:     Rate and Rhythm: Normal rate.     Pulses: Normal pulses.     Heart sounds: Normal heart sounds.  Skin:    General: Skin is warm.     Capillary Refill: Capillary refill takes less than 2 seconds.  Neurological:     Mental Status: She is alert and oriented to person, place, and time.  Psychiatric:        Mood and Affect: Mood normal.        Behavior: Behavior normal.        Thought Content: Thought content normal.        Judgment: Judgment normal.       Assessment & Plan:   1. Controlled type 2 diabetes mellitus without complication, without long-term current use of insulin ([place])   2. Healthcare maintenance   3. Essential hypertension   4. Hyperlipidemia, unspecified hyperlipidemia type   5. Other chronic pain     Diabetes mellitus type 2, controlled, without complications ([facility]) Lab Results  Component Value Date   HGBA1C 6.4 (A) 10/20/2018   HGBA1C 7.0 (H) 07/02/2018   HGBA1C 7.3 (H) 12/07/2014   Continue on Metforming 500mg - 1/2 tab with dinner daily Please check what diabetic test strips your meter uses and call clinic with  information so that we may send in Rx. Remain well hydrated and follow Diabetic diet.   Essential hypertension BP at goal 130/73, HR 92 Continue Losartan 50mg  QD   Healthcare maintenance  Please continue all medications as directed, with one new addition- Duloxetine (Cymbalta) 20mg  once daily. Please check what diabetic test strips your meter uses and call clinic with information so that we may send in Rx. Remain well hydrated and follow Diabetic diet. Follow-up in 4 weeks, please come fasting so labs can also be completed.  Hyperlipidemia Needs fasting labs  Other chronic pain Continue with pain clinic Started on Duloxetine (Cymbalta) 20mg  once daily to augment Tramadol 50mg  QD BMP 10/2018, GFR >60     FOLLOW-UP:  Return in about 4 weeks (around 11/17/2018) for Regular Follow Up, HTN, Diabetes, Fasting Labs, General Anxiety Disorder.

## 2018-10-19 ENCOUNTER — Ambulatory Visit: Payer: Medicare Other | Admitting: Adult Health

## 2018-10-19 ENCOUNTER — Telehealth: Payer: Self-pay | Admitting: Adult Health

## 2018-10-19 NOTE — Telephone Encounter (Signed)
Advised pt's daughter that pt does need to keep her appt tomorrow because it is a f/u to DM and she will need labs.  Daughter expressed understanding and is agreeable.  Charyl Bigger, CMA

## 2018-10-19 NOTE — Telephone Encounter (Signed)
Patient's daughter is requesting call back from clinic staff regarding her appt tomorrow afternoon. They are wondering if this appt is actually necessary. The daughter states she has had several specialist appts since seeing [place], with lab work as well (patient states they are all [facility] providers). They feel that this appt maybe be overkill but wanted to see prior to cancelling. Please contact the daughter Otila Kluver at [phone]

## 2018-10-20 ENCOUNTER — Encounter: Payer: Self-pay | Admitting: Adult Health

## 2018-10-20 ENCOUNTER — Ambulatory Visit (INDEPENDENT_AMBULATORY_CARE_PROVIDER_SITE_OTHER): Payer: Medicare Other | Admitting: Adult Health

## 2018-10-20 VITALS — BP 130/73 | HR 92 | Temp 98.0°F | Ht 64.0 in | Wt 185.9 lb

## 2018-10-20 DIAGNOSIS — Z0000 Encounter for general adult medical examination without abnormal findings: Secondary | ICD-10-CM | POA: Diagnosis not present

## 2018-10-20 DIAGNOSIS — G8929 Other chronic pain: Secondary | ICD-10-CM | POA: Diagnosis not present

## 2018-10-20 DIAGNOSIS — E785 Hyperlipidemia, unspecified: Secondary | ICD-10-CM | POA: Diagnosis not present

## 2018-10-20 DIAGNOSIS — E119 Type 2 diabetes mellitus without complications: Secondary | ICD-10-CM | POA: Diagnosis not present

## 2018-10-20 DIAGNOSIS — I10 Essential (primary) hypertension: Secondary | ICD-10-CM

## 2018-10-20 LAB — POCT GLYCOSYLATED HEMOGLOBIN (HGB A1C): Hemoglobin A1C: 6.4 % — AB (ref 4.0–5.6)

## 2018-10-20 MED ORDER — METFORMIN HCL 500 MG PO TABS: ORAL_TABLET | ORAL | 1 refills | Status: DC

## 2018-10-20 MED ORDER — DULOXETINE HCL 20 MG PO CPEP: 20.0000 mg | ORAL_CAPSULE | Freq: Every day | ORAL | 0 refills | Status: DC

## 2018-10-20 NOTE — Assessment & Plan Note (Signed)
  Please continue all medications as directed, with one new addition- Duloxetine (Cymbalta) 20mg  once daily. Please check what diabetic test strips your meter uses and call clinic with information so that we may send in Rx. Remain well hydrated and follow Diabetic diet. Follow-up in 4 weeks, please come fasting so labs can also be completed.

## 2018-10-20 NOTE — Assessment & Plan Note (Signed)
>>  ASSESSMENT AND PLAN FOR HYPERLIPIDEMIA WRITTEN ON 10/20/2018  4:58 PM BY DANFORD, KATY D, NP  Needs fasting labs

## 2018-10-20 NOTE — Patient Instructions (Addendum)
Diabetes Mellitus and Nutrition, Adult When you have diabetes (diabetes mellitus), it is very important to have healthy eating habits because your blood sugar (glucose) levels are greatly affected by what you eat and drink. Eating healthy foods in the appropriate amounts, at about the same times every day, can help you:  Control your blood glucose.  Lower your risk of heart disease.  Improve your blood pressure.  Reach or maintain a healthy weight. Every person with diabetes is different, and each person has different needs for a meal plan. Your health care provider may recommend that you work with a diet and nutrition specialist (dietitian) to make a meal plan that is best for you. Your meal plan may vary depending on factors such as:  The calories you need.  The medicines you take.  Your weight.  Your blood glucose, blood pressure, and cholesterol levels.  Your activity level.  Other health conditions you have, such as heart or kidney disease. How do carbohydrates affect me? Carbohydrates, also called carbs, affect your blood glucose level more than any other type of food. Eating carbs naturally raises the amount of glucose in your blood. Carb counting is a method for keeping track of how many carbs you eat. Counting carbs is important to keep your blood glucose at a healthy level, especially if you use insulin or take certain oral diabetes medicines. It is important to know how many carbs you can safely have in each meal. This is different for every person. Your dietitian can help you calculate how many carbs you should have at each meal and for each snack. Foods that contain carbs include:  Bread, cereal, rice, pasta, and crackers.  Potatoes and corn.  Peas, beans, and lentils.  Milk and yogurt.  Fruit and juice.  Desserts, such as cakes, cookies, ice cream, and candy. How does alcohol affect me? Alcohol can cause a sudden decrease in blood glucose (hypoglycemia),  especially if you use insulin or take certain oral diabetes medicines. Hypoglycemia can be a life-threatening condition. Symptoms of hypoglycemia (sleepiness, dizziness, and confusion) are similar to symptoms of having too much alcohol. If your health care provider says that alcohol is safe for you, follow these guidelines:  Limit alcohol intake to no more than 1 drink per day for nonpregnant women and 2 drinks per day for men. One drink equals 12 oz of beer, 5 oz of wine, or 1 oz of hard liquor.  Do not drink on an empty stomach.  Keep yourself hydrated with water, diet soda, or unsweetened iced tea.  Keep in mind that regular soda, juice, and other mixers may contain a lot of sugar and must be counted as carbs. What are tips for following this plan?  Reading food labels  Start by checking the serving size on the "Nutrition Facts" label of packaged foods and drinks. The amount of calories, carbs, fats, and other nutrients listed on the label is based on one serving of the item. Many items contain more than one serving per package.  Check the total grams (g) of carbs in one serving. You can calculate the number of servings of carbs in one serving by dividing the total carbs by 15. For example, if a food has 30 g of total carbs, it would be equal to 2 servings of carbs.  Check the number of grams (g) of saturated and trans fats in one serving. Choose foods that have low or no amount of these fats.  Check the number of   milligrams (mg) of salt (sodium) in one serving. Most people should limit total sodium intake to less than 2,300 mg per day.  Always check the nutrition information of foods labeled as "low-fat" or "nonfat". These foods may be higher in added sugar or refined carbs and should be avoided.  Talk to your dietitian to identify your daily goals for nutrients listed on the label. Shopping  Avoid buying canned, premade, or processed foods. These foods tend to be high in fat, sodium,  and added sugar.  Shop around the outside edge of the grocery store. This includes fresh fruits and vegetables, bulk grains, fresh meats, and fresh dairy. Cooking  Use low-heat cooking methods, such as baking, instead of high-heat cooking methods like deep frying.  Cook using healthy oils, such as olive, canola, or sunflower oil.  Avoid cooking with butter, cream, or high-fat meats. Meal planning  Eat meals and snacks regularly, preferably at the same times every day. Avoid going long periods of time without eating.  Eat foods high in fiber, such as fresh fruits, vegetables, beans, and whole grains. Talk to your dietitian about how many servings of carbs you can eat at each meal.  Eat 4-6 ounces (oz) of lean protein each day, such as lean meat, chicken, fish, eggs, or tofu. One oz of lean protein is equal to: ? 1 oz of meat, chicken, or fish. ? 1 egg. ?  cup of tofu.  Eat some foods each day that contain healthy fats, such as avocado, nuts, seeds, and fish. Lifestyle  Check your blood glucose regularly.  Exercise regularly as told by your health care provider. This may include: ? 150 minutes of moderate-intensity or vigorous-intensity exercise each week. This could be brisk walking, biking, or water aerobics. ? Stretching and doing strength exercises, such as yoga or weightlifting, at least 2 times a week.  Take medicines as told by your health care provider.  Do not use any products that contain nicotine or tobacco, such as cigarettes and e-cigarettes. If you need help quitting, ask your health care provider.  Work with a Social worker or diabetes educator to identify strategies to manage stress and any emotional and social challenges. Questions to ask a health care provider  Do I need to meet with a diabetes educator?  Do I need to meet with a dietitian?  What number can I call if I have questions?  When are the best times to check my blood glucose? Where to find more  information:  American Diabetes Association: diabetes.org  Academy of Nutrition and Dietetics: www.eatright.[employer] of Diabetes and Digestive and Kidney Diseases (NIH): [employer] Summary  A healthy meal plan will help you control your blood glucose and maintain a healthy lifestyle.  Working with a diet and nutrition specialist (dietitian) can help you make a meal plan that is best for you.  Keep in mind that carbohydrates (carbs) and alcohol have immediate effects on your blood glucose levels. It is important to count carbs and to use alcohol carefully. This information is not intended to replace advice given to you by your health care provider. Make sure you discuss any questions you have with your health care provider. Document Released: 05/15/2005 Document Revised: 03/18/2017 Document Reviewed: 09/22/2016 Elsevier Interactive Patient Education  2019 [employer].  Please continue all medications as directed, with one new addition- Duloxetine (Cymbalta) 20mg  once daily. Please check what diabetic test strips your meter uses and call clinic with information so that we may send  in Rx. Remain well hydrated and follow Diabetic diet. Follow-up in 4 weeks, please come fasting so labs can also be completed. GREAT TO SEE YOU!

## 2018-10-20 NOTE — Assessment & Plan Note (Signed)
Needs fasting labs

## 2018-10-20 NOTE — Assessment & Plan Note (Signed)
Lab Results  Component Value Date   HGBA1C 6.4 (A) 10/20/2018   HGBA1C 7.0 (H) 07/02/2018   HGBA1C 7.3 (H) 12/07/2014   Continue on Metforming 500mg - 1/2 tab with dinner daily Please check what diabetic test strips your meter uses and call clinic with information so that we may send in Rx. Remain well hydrated and follow Diabetic diet.

## 2018-10-20 NOTE — Assessment & Plan Note (Signed)
BP at goal 130/73, HR 92 Continue Losartan 50mg  QD

## 2018-10-20 NOTE — Assessment & Plan Note (Signed)
Continue with pain clinic Started on Duloxetine (Cymbalta) 20mg  once daily to augment Tramadol 50mg  QD BMP 10/2018, GFR >60

## 2018-10-20 NOTE — Assessment & Plan Note (Signed)
>>  ASSESSMENT AND PLAN FOR ESSENTIAL HYPERTENSION WRITTEN ON 10/20/2018  4:57 PM BY DANFORD, KATY D, NP  BP at goal 130/73, HR 92 Continue Losartan 50mg  QD

## 2018-10-26 DIAGNOSIS — M5416 Radiculopathy, lumbar region: Secondary | ICD-10-CM | POA: Diagnosis not present

## 2018-10-28 DIAGNOSIS — M5416 Radiculopathy, lumbar region: Secondary | ICD-10-CM | POA: Diagnosis not present

## 2018-11-10 ENCOUNTER — Encounter: Payer: Self-pay | Admitting: Internal Medicine

## 2018-11-10 ENCOUNTER — Other Ambulatory Visit: Payer: Self-pay

## 2018-11-10 ENCOUNTER — Ambulatory Visit (INDEPENDENT_AMBULATORY_CARE_PROVIDER_SITE_OTHER): Payer: Medicare Other | Admitting: Internal Medicine

## 2018-11-10 VITALS — BP 138/60 | HR 95 | Ht 64.0 in | Wt 179.0 lb

## 2018-11-10 DIAGNOSIS — J9611 Chronic respiratory failure with hypoxia: Secondary | ICD-10-CM | POA: Diagnosis not present

## 2018-11-10 DIAGNOSIS — R05 Cough: Secondary | ICD-10-CM | POA: Diagnosis not present

## 2018-11-10 DIAGNOSIS — R058 Other specified cough: Secondary | ICD-10-CM

## 2018-11-10 DIAGNOSIS — J449 Chronic obstructive pulmonary disease, unspecified: Secondary | ICD-10-CM

## 2018-11-10 DIAGNOSIS — R0609 Other forms of dyspnea: Secondary | ICD-10-CM | POA: Diagnosis not present

## 2018-11-10 NOTE — Progress Notes (Signed)
Subjective:   [name], female    DOB: 23-Jan-1941     MRN: [id number]    Brief patient profile:  27 yowf quit smoking around 1990 with documented GOLD II copd 12/2014  with no resp problems until spring of 2016 > admit cc progressively SOB x two weeks with orthopnea, PND and swelling in her abdomen. Some coughing which is worse when lying down s sign peripheral edema:   Admit date: 12/06/2014 Discharge date: 12/15/2014  PCP: Simona Huh, MD Primary Cardiologist: Dr. Sallyanne Kuster  Primary Discharge Diagnosis: Accelerated hypertension with diastolic congestive heart failure, NYHA class 3 - weight 206 pounds at discharge  Secondary Discharge Diagnosis:   CHB (complete heart block)   Pacemaker - dual chamber [employer], 2009  Essential hypertension  Normal coronary arteries angiography 2002  Hyperlipidemia  Acute congestive heart failure  Congestive heart disease  Dyspnea  SOB (shortness of breath)  Sleep apnea  Hypoxemia  Acute respiratory failure with hypoxemia  Acute pulmonary edema  Pulmonary hypertension    01/18/2015 1st  Pulmonary office visit/ [name] / referred by Cards/ Miguel Rota re copd/02 desp resp failure   Chief Complaint  Patient presents with  . Pulmonary Consult    Referred by Miguel Rota, PA for eval of Hypoxia. Pt c/o SOB "all the time" for the past 3 wks.  She has been clearing her throat alot for the past couple wks.   new doe x 3 steps but not wearing 02 / steps were  not a problem before her admit (confirmed only sob x 2 weeks pre-admit)  Also new sob talking and lump in throat / dysyphagia since discharge > that was not present prior to d/c on ACEi new start assoc with dry daytime cough/ urge to clear throat Now lying flat at hs ok on 02 comfortably  but confused as to how to use 02 sitting/walking rec Only use the 02 at bedtime and keep a portable system handy for as needed use but at this point your  walking is not limited by lack of 02  Stop lisinopril and your breathing should improve back to where it was previously  Start valsartan 80 mg daily in place of lisinopril Please schedule a follow up office visit in 6 weeks, call sooner if needed and we'll re-evaluate your needs then    03/01/2015 f/u ov/[name] re:  Noct 02 dep/GOLD II  copd/ uacs ? How much acei related off x 6 weeks  Chief Complaint  Patient presents with  . Follow-up    Pt states that her breathing has improved some. She states she has only had to use her o2 a few times during the day. No new co's today.   walking at home is better but not like it was due to limiting sob  But also back and knees limit her  Was able to do some house work but can't do sweeper s doe/ sats ok  Sleeping on 02 2lpm flat Taking gabapentin 300 x 2  = 6 per day  / still frog in throat daytime but not as bad as while on ACEi  rec Try prilosec 20mg   Take 30-60 min before first meal of the day and Pepcid (famotidine) 20 mg one at bedtime until frog in throat  is completely gone for at least a week  GERD diet  If breathing getting worse or cough worse return right away    10/29/2015  f/u ov/[name] re: GOLD II copd/  obesity / noct 02 only  And no maint  resp rx / did not follow gerd rx or diet - using mineral oil  Chief Complaint  Patient presents with  . Follow-up    Breathing is doing well. She is here to recertify for o2. No new co's today.   push buggy with HT no 02 just 2lpm at hs  Still with constant sensation of throat mucus day >> noct  rec Try prilosec 20mg   Take 30-60 min before first meal of the day and Pepcid (famotidine) 20 mg one at bedtime until frog in throat  is completely gone for at least a week  GERD diet  Please see patient coordinator before you leave today  to schedule overnight oxygen levels on Room air  Pulmonary follow up is as needed if breathing gets worse - if hoarseness not better on the above plan rec ENT eval next       10/13/2018  f/u ov/[name] re:  GOLDII but 02 dep hs/ prn daytime Chief Complaint  Patient presents with  . Acute Visit    Had a fall with rib fx on 07/01/18 and has been having SOB since then.  She was hospitalized then and was placed on o2 24/7.   Dyspnea:  Ever since fell 07/01/18 /walking 29 ft and stops with 02 at 2lpm slow pace no desats per fm   Cough: none / still clearing throat a lot an never saw ent as rec  Sleeping: bed flat /2 pillows  02: 2lpm hs and prn daytime  rec Try prilosec otc 20mg   Take 30-60 min before first meal of the day and Pepcid ac (famotidine) 20 mg one @  bedtime until  Return Use Incentive spirometer as much as possible and minimize sedating meds  GERD diet  Keep on 02 at bedtime and for sats over 90% daytime adjust 02 up down or off Please remember to go to the lab and x-ray department   for your tests - we will call you with the results when they are available. Please schedule a follow up office visit in 4 weeks, sooner if needed  with all medications /inhalers/ solutions/devices  in hand so we can verify exactly what you are taking. This includes all medications from all doctors and over the counters pfts when return    11/10/2018  f/u ov/[name] re:  GOLD II / 02 dep hs and prn daytime/ no maint rx  Chief Complaint  Patient presents with  . Follow-up    Breathing is overall doing well.    Dyspnea:  100 ft driveway decline to road, made it back to house without stopping and no 02  Cough: not clearing throat as much  Sleeping: flat / one pillow  SABA use: none 02: 2lpm at bedtime / prn during the day   No obvious day to day or daytime variability or assoc excess/ purulent sputum or mucus plugs or hemoptysis or cp or chest tightness, subjective wheeze or overt sinus or hb symptoms.   Sleeping ok  without nocturnal  or early am exacerbation  of respiratory  c/o's or need for noct saba. Also denies any obvious fluctuation of symptoms with weather or  environmental changes or other aggravating or alleviating factors except as outlined above   No unusual exposure hx or h/o childhood pna/ asthma or knowledge of premature birth.  Current Allergies, Complete Past Medical History, Past Surgical History, Family History, and Social History were reviewed in [employer]  medical record.  ROS  The following are not active complaints unless bolded Hoarseness, sore throat, dysphagia, dental problems, itching, sneezing,  nasal congestion or discharge of excess mucus or purulent secretions, ear ache,   fever, chills, sweats, unintended wt loss or wt gain, classically pleuritic or exertional cp,  orthopnea pnd or arm/hand swelling  or leg swelling, presyncope, palpitations, abdominal pain, anorexia, nausea, vomiting, diarrhea  or change in bowel habits or change in bladder habits, change in stools or change in urine, dysuria, hematuria,  rash, arthralgias, visual complaints, headache, numbness, weakness or ataxia or problems with walking or coordination,  change in mood or  memory.        Current Meds  Medication Sig  . acetaminophen (TYLENOL) 500 MG tablet Take 500 mg by mouth 4 (four) times daily -  with meals and at bedtime.  Marland Kitchen aspirin EC 81 MG tablet Take 81 mg by mouth daily.  . DULoxetine (CYMBALTA) 20 MG capsule Take 1 capsule (20 mg total) by mouth daily.  . famotidine (PEPCID) 20 MG tablet Take 20 mg by mouth at bedtime.  . furosemide (LASIX) 40 MG tablet TAKE 1 TABLET (40 MG TOTAL) BY MOUTH DAILY. (Patient taking differently: Take 40 mg by mouth daily. )  . ibuprofen (ADVIL,MOTRIN) 200 MG tablet Take 200 mg by mouth every 6 (six) hours as needed.  Marland Kitchen KLOR-CON M20 20 MEQ tablet TAKE 1 TABLET (20 MEQ TOTAL) BY MOUTH DAILY.  Marland Kitchen losartan (COZAAR) 50 MG tablet Take 1 tablet (50 mg total) by mouth daily.  . metFORMIN (GLUCOPHAGE) 500 MG tablet 1/2 tablet nightly with dinner  . omeprazole (PRILOSEC) 20 MG capsule Take 20 mg by mouth every  morning.  . OXYGEN 2lpm with sleep and exertion [employer]  . pravastatin (PRAVACHOL) 40 MG tablet Take 40 mg by mouth daily.  Marland Kitchen tiZANidine (ZANAFLEX) 2 MG tablet Take 2 mg by mouth every 6 (six) hours as needed for muscle spasms.  . traMADol (ULTRAM) 50 MG tablet Take 1 tablet by mouth every 6 (six) hours as needed.            Objective:   Physical Exam   amb slt hoarse wf nad using rollator    03/01/2015        198 > 10/29/2015  200 > 10/13/2018    185  > 11/10/2018  179    01/18/15 202 lb (91.627 kg)  01/10/15 197 lb (89.359 kg)  01/09/15 203 lb 1.6 oz (92.126 kg)      Vital signs reviewed - Note on arrival 02 sats  92% on RA        HEENT: Edentulous/ nl oropharynx. Nl external ear canals without cough reflex -  Mild bilateral non-specific turbinate edema     NECK :  without JVD/Nodes/TM/ nl carotid upstrokes bilaterally   LUNGS: no acc muscle use,  Mild barrel  contour chest wall with bilateral  Distant bs s audible wheeze and  without cough on insp or exp maneuver and mild  Hyperresonant  to  percussion bilaterally     CV:  RRR  no s3 or murmur or increase in P2, and no edema   ABD:  soft and nontender with pos late  insp Hoover's  in the supine position. No bruits or organomegaly appreciated, bowel sounds nl  MS:   Nl gait/  ext warm without deformities, calf tenderness, cyanosis or clubbing No obvious joint restrictions   SKIN: warm and dry without lesions    NEURO:  alert, approp, nl sensorium with  no motor or cerebellar deficits apparent.            Assessment & Plan:

## 2018-11-10 NOTE — Patient Instructions (Addendum)
Use bed blocks x 6-8 inches and try to wean the tramadol some if you can   Goal for 02 is to keep around 90% or above   Please schedule a follow up visit in 6  months but call sooner if needed

## 2018-11-12 ENCOUNTER — Encounter: Payer: Self-pay | Admitting: Internal Medicine

## 2018-11-12 ENCOUNTER — Other Ambulatory Visit: Payer: Self-pay | Admitting: Adult Health

## 2018-11-12 NOTE — Assessment & Plan Note (Signed)
Noct 02 2lpm since admit 12/2014  - 10/29/2015   Walked RA x one lap @ 185 stopped due to  Weak leg no sob, sat still 89%  - ONO  11/01/15 RA >  No desat - as of 11/10/2018 using 2lpm hs and with activity   Adequate control on present rx, reviewed in detail with pt > no change in rx needed  > rec monitor sats at end of ex with goal of keeping > 90%

## 2018-11-12 NOTE — Assessment & Plan Note (Signed)
Onset spring 2016 - Note PCWP 24 at cath 12/14/14  - Trial off acei 01/18/2015 > marginally improved 02/29/16  - 01/18/2015   Walked RA x one lap @ 185 stopped due to  Sob but no desat, slow to nl  Pace - 03/01/2015   Walked RA x one lap @ 185 stopped due to  fatigue > sob/ no desat/ moderate pace    Offered pulmonary rehab but declined

## 2018-11-12 NOTE — Assessment & Plan Note (Addendum)
Quit smoking 1990 pfts 12/13/14  FEV1  1.06 (53%) ratio 57  And dlco 30 corrects to 77%    As I explained to this patient in detail:  although there may be copd present, it may not be clinically relevant:   it does not appear to be limiting activity tolerance any more than a set of worn tires limits someone from driving a car  around a parking lot.  A new set of Michelins might look good but would have no perceived impact on the performance of the car and would not be worth the cost.  That is to say:   this pt is so sedentary I don't recommend aggressive pulmonary rx at this point unless limiting symptoms arise or acute exacerbations become as issue, neither of which is the case now.  I asked the patient to contact this office at any time in the future should either of these problems arise.    Would start with full repeat pfts then trial of laba/lama  = bevespi or stiolto as has issues with UACS so not likely to tol dpi.

## 2018-11-12 NOTE — Assessment & Plan Note (Signed)
Trial off acei 01/18/15 > marginally improved sob/ globus - trial of max gerd rx 10/29/2015 > ent eval rec but never done  - Allergy profile 10/14/18  >  Eos 0.0 /  IgE  29 RAST neg   Improved but was using tramadol for pain p fell and is still relying on it too much so rec  1) wean tramadol 2) continue h2 bid 3) Bed blocks demonstrated on line availability as very cheap potential benefit to supplement diet.   Each maintenance medication was reviewed in detail including most importantly the difference between maintenance and as needed and under what circumstances the prns are to be used.  Please see AVS for specific  Instructions which are unique to this visit and I personally typed out  which were reviewed in detail in writing with the patient and a copy provided.     F/u q 6 m, sooner prn

## 2018-11-14 NOTE — Progress Notes (Deleted)
Subjective:    [name], female    DOB: 09-26-1940, 78 y.o.   MRN: [id number]  HPI: 09/21/2018 OV:  [name] is here to establish as a new pt.  She is a pleasant 78 year old female. PMH:T2D, HTN, Morbid Obesity, HLD, CHB, Chronic back pain with L side sciatica She was on max daily dose of Gabapentin for chronic pain- suffered fall Oct 2019- has weaned off Gabapentin Now on Oxycodone 5mg  from Orthopedic Specialist  CHB treated with PPM- recently had device change out May 2019, follwed by Dr. Sallyanne Kuster.- last OV 09/08/2018- "she is to take losartan 50 mg daily and to stop irbesartan" She has not been on diabetic medication for years, per pt she was "diet controlled" She has been slowly losing weight since Fall 2019 by reducing portion sizes, current wt 187, she estimates to have lost >15 lbs since Oct 2019 She has been on supplemental O2 since Oct 2019- currently 2L She reports sats will fall into high 80s when off O2, she uses same flow rate when sleeping She was previously been treated by Dr. Wende Mott, last contact 2017 She denies current tobacco/vape/ETOH use She lives alone, however has local daughter who is quite involved in her care She ambulates with walker, denies any recent falls She reports being able to perform ADLs, IADLs and feels safe at home  Lab Results  Component Value Date   HGBA1C 6.4 (A) 10/20/2018   HGBA1C 7.0 (H) 07/02/2018   HGBA1C 7.3 (H) 12/07/2014   10/20/2018 OV: [name] presents for f/u: T2D, HTN She has titrated up on Metformin- currently taking 1/2 500mg  tab with dinner Lab Results  Component Value Date   HGBA1C 6.4 (A) 10/20/2018   HGBA1C 7.0 (H) 07/02/2018   HGBA1C 7.3 (H) 12/07/2014  She reports pain clinic stopped Oxycodone 5mg  and is now on Tramadol 50mg  QD She reports pain is not well controlled and her overall anxiety level is elevated. She denies thoughts of harming herself/others  She was seen by Dr. Phillip Heal-  10/13/2018 Pt states that her breathing has improved some. She states she has only had to use her o2 a few times during the day. No new co's today.   walking at home is better but not like it was due to limiting sob  But also back and knees limit her  Was able to do some house work but can't do sweeper s doe/ sats ok  Sleeping on 02 2lpm flat Taking gabapentin 300 x 2  = 6 per day  / still frog in throat daytime but not as bad as while on ACEi  rec Try prilosec 20mg   Take 30-60 min before first meal of the day and Pepcid (famotidine) 20 mg one at bedtime until frog in throat  is completely gone for at least a week  GERD diet  If breathing getting worse or cough worse return right away  Daughter at [facility] during [place]  11/17/2018 OV: [name] is here for f/u: fibromyalgia Started on Duloxetine (Cymbalta) 20mg  once daily to augment Tramadol 50mg  QD BMP 10/2018, GFR >60 Patient Care Team    Relationship Specialty Notifications Start End  Esaw Grandchild, NP PCP - General Family Medicine  09/21/18   Sanda Klein, MD PCP - Cardiology Cardiology Admissions 09/08/18     Patient Active Problem List   Diagnosis Date Noted  . Healthcare maintenance 10/20/2018  . Other chronic pain 10/20/2018  . Rib fracture 07/02/2018  .  Diabetes mellitus type 2, controlled, without complications ([place]) 35/32/9924  . Acute respiratory failure with hypoxia ([place]) 07/01/2018  . Pacemaker battery depletion 05/26/2018  . Mild obesity 01/05/2016  . Chronic respiratory failure with hypoxia ([place]) 10/29/2015  . Morbid obesity ([place]) 10/29/2015  . Upper airway cough syndrome 03/04/2015  . COPD GOLD II 01/18/2015  . Pulmonary hypertension ([place])   . Dyspnea   . DOE (dyspnea on exertion)   . Sleep apnea   . Hypoxemia   . Acute respiratory failure with hypoxemia ([place])   . Acute pulmonary edema (HCC)   . Chronic diastolic heart failure ([place]) 12/06/2014  . Accelerated hypertension with diastolic congestive heart failure,  NYHA class 3 ([place]) 12/06/2014  . CHB (complete heart block) ([place]) 09/19/2013  . Pacemaker - dual chamber St Jude, 2009 09/19/2013  . Essential hypertension 09/19/2013  . Normal coronary arteries angiography 2002 09/19/2013  . Hyperlipidemia 09/19/2013  . Varicose veins of lower extremities with other complications 26/83/4196     Past Medical History:  Diagnosis Date  . Arthritis    "hands, spine" (12/07/2014)  . CHB (complete heart block) ([place]) 09/19/2013  . CHF (congestive heart failure) ([place]) dx'd 12/06/2014  . Chronic pain    "since neck OR in 2007"  . Coronary artery disease    normal coronaries by 09/10/00 cath  . Diabetes mellitus    hga1c 7.1 no meds  . Dysrhythmia   . HTN (hypertension) 09/19/2013  . Hypercholesterolemia   . Hyperlipidemia 09/19/2013  . Hypertension   . Myocardial infarction ([place]) dx'd 2009  . Neuropathy of both feet   . Pacemaker   . 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 Shortness of breath    exertional  . Type II diabetes mellitus ([place])    "had it years ago; lost weight after neck OR in 2007; glucose went way down; recently dx'd as returned recently" (12/07/2014)  . Urinary incontinence    "since 2007's OR"  . Venous insufficiency (chronic) (peripheral)      Past Surgical History:  Procedure Laterality Date  . ANTERIOR CERVICAL DECOMP/DISCECTOMY FUSION  2007  . BACK SURGERY    . CARDIAC CATHETERIZATION  09/10/2000   normal Coronary arteries, EF60%,   . CARDIAC PACEMAKER PLACEMENT  12/01/2007   St.Jude Zephyr Elta [place] #[id number], dual chamber  . CARDIOVASCULAR STRESS TEST  08/28/2000   Cardiolite perfusion study, EF58%, low risk study,   . DILATION AND CURETTAGE OF UTERUS  1960's  . KNEE ARTHROSCOPY Right   . LAPAROSCOPIC CHOLECYSTECTOMY  1995  . LEFT AND RIGHT HEART CATHETERIZATION WITH CORONARY ANGIOGRAM N/A 12/14/2014   Procedure: LEFT AND RIGHT HEART CATHETERIZATION WITH CORONARY ANGIOGRAM;  Surgeon: Peter M Martinique,  MD;  Location: [facility] CATH LAB;  Service: Cardiovascular;  Laterality: N/A;  . Lower Ext. Dopplers  06/12/2011   no evidence of thrombus, all vessels normal in size, no evidence of insuffiency  . LUMBAR LAMINECTOMY/DECOMPRESSION MICRODISCECTOMY  03/02/2012   Procedure: LUMBAR LAMINECTOMY/DECOMPRESSION MICRODISCECTOMY 2 LEVELS;  Surgeon: Floyce Stakes, MD;  Location: [place] NEURO ORS;  Service: Neurosurgery;  Laterality: Bilateral;  Lumbar three, lumbar four, lumbar five Laminectomy/Foraminotomy  . NM MYOCAR PERF WALL MOTION  10/24/2008   protocol:Lexiscan, EF63%, normal study, scan negative for ischemia  . PPM GENERATOR CHANGEOUT N/A 05/26/2018   Procedure: PPM GENERATOR CHANGEOUT;  Surgeon: Sanda Klein, MD;  Location: [place] CV LAB;  Service: Cardiovascular;  Laterality: N/A;  . TRANSTHORACIC ECHOCARDIOGRAM  11/30/2007  normal echo, mean transaortic valve gradient 81mmHg     Family History  Problem Relation Age of Onset  . Diabetes Father   . Heart disease Father        before age 80  . Hyperlipidemia Father   . Hypertension Father   . Heart attack Father   . Congestive Heart Failure Father   . Diabetes Mother   . Heart disease Mother   . Hyperlipidemia Mother   . Hypertension Mother   . Congestive Heart Failure Mother   . Diabetes Brother   . Congestive Heart Failure Brother      Social History   Substance and Sexual Activity  Drug Use No     Social History   Substance and Sexual Activity  Alcohol Use No     Social History   Tobacco Use  Smoking Status Former Smoker  . Packs/day: 1.00  . Years: 40.00  . Pack years: 40.00  . Types: Cigarettes  . Last attempt to quit: 03/02/1992  . Years since quitting: 26.7  Smokeless Tobacco Never Used     Outpatient Encounter Medications as of 11/17/2018  Medication Sig  . acetaminophen (TYLENOL) 500 MG tablet Take 500 mg by mouth 4 (four) times daily -  with meals and at bedtime.  Marland Kitchen aspirin EC 81 MG tablet Take 81 mg  by mouth daily.  . DULoxetine (CYMBALTA) 20 MG capsule Take 1 capsule (20 mg total) by mouth daily.  . famotidine (PEPCID) 20 MG tablet Take 20 mg by mouth at bedtime.  . furosemide (LASIX) 40 MG tablet TAKE 1 TABLET (40 MG TOTAL) BY MOUTH DAILY. (Patient taking differently: Take 40 mg by mouth daily. )  . ibuprofen (ADVIL,MOTRIN) 200 MG tablet Take 200 mg by mouth every 6 (six) hours as needed.  Marland Kitchen KLOR-CON M20 20 MEQ tablet TAKE 1 TABLET (20 MEQ TOTAL) BY MOUTH DAILY.  Marland Kitchen losartan (COZAAR) 50 MG tablet Take 1 tablet (50 mg total) by mouth daily.  . metFORMIN (GLUCOPHAGE) 500 MG tablet 1/2 tablet nightly with dinner  . omeprazole (PRILOSEC) 20 MG capsule Take 20 mg by mouth every morning.  . OXYGEN 2lpm with sleep and exertion [employer]  . pravastatin (PRAVACHOL) 40 MG tablet Take 40 mg by mouth daily.  Marland Kitchen tiZANidine (ZANAFLEX) 2 MG tablet Take 2 mg by mouth every 6 (six) hours as needed for muscle spasms.  . traMADol (ULTRAM) 50 MG tablet Take 1 tablet by mouth every 6 (six) hours as needed.   No facility-administered encounter medications on file as of 11/17/2018.     Allergies: Patient has no known allergies.  There is no height or weight on file to calculate BMI.  There were no vitals taken for this visit.  Review of Systems  Constitutional: Positive for activity change and fatigue. Negative for appetite change, chills, diaphoresis and fever.  HENT: Negative for congestion.   Eyes: Negative for visual disturbance.  Respiratory: Positive for shortness of breath. Negative for cough, chest tightness, wheezing and stridor.   Cardiovascular: Positive for leg swelling. Negative for chest pain and palpitations.  Gastrointestinal: Negative for abdominal distention, abdominal pain, blood in stool, constipation, diarrhea, nausea and vomiting.  Endocrine: Negative for cold intolerance, heat intolerance, polydipsia, polyphagia and polyuria.  Genitourinary: Negative for difficulty urinating and  enuresis.  Musculoskeletal: Positive for arthralgias, back pain, gait problem, joint swelling and myalgias. Negative for neck pain and neck stiffness.  Skin: Negative for color change, pallor, rash and wound.  Neurological: Negative for  dizziness and headaches.  Psychiatric/Behavioral: Positive for sleep disturbance. Negative for agitation, behavioral problems, confusion, decreased concentration, dysphoric mood, hallucinations, self-injury and suicidal ideas. The patient is not nervous/anxious and is not hyperactive.        Objective:   Physical Exam Vitals signs and nursing note reviewed.  Constitutional:      General: She is not in acute distress.    Appearance: She is obese. She is not ill-appearing, toxic-appearing or diaphoretic.  Cardiovascular:     Rate and Rhythm: Normal rate.     Pulses: Normal pulses.     Heart sounds: Normal heart sounds.  Skin:    General: Skin is warm.     Capillary Refill: Capillary refill takes less than 2 seconds.  Neurological:     Mental Status: She is alert and oriented to person, place, and time.  Psychiatric:        Mood and Affect: Mood normal.        Behavior: Behavior normal.        Thought Content: Thought content normal.        Judgment: Judgment normal.       Assessment & Plan:   No diagnosis found.  No problem-specific Assessment & Plan notes found for this encounter.    FOLLOW-UP:  No follow-ups on file.

## 2018-11-17 ENCOUNTER — Ambulatory Visit: Payer: Medicare Other | Admitting: Adult Health

## 2018-12-03 ENCOUNTER — Other Ambulatory Visit: Payer: Self-pay | Admitting: Adult Health

## 2018-12-03 IMAGING — DX DG CHEST 1V PORT
1 series · 1 of 1 positions shown · non-contrast
Comparison: Due CT yesterday

CLINICAL DATA: Multiple rib fractures

EXAM:
PORTABLE CHEST 1 VIEW

[chest]
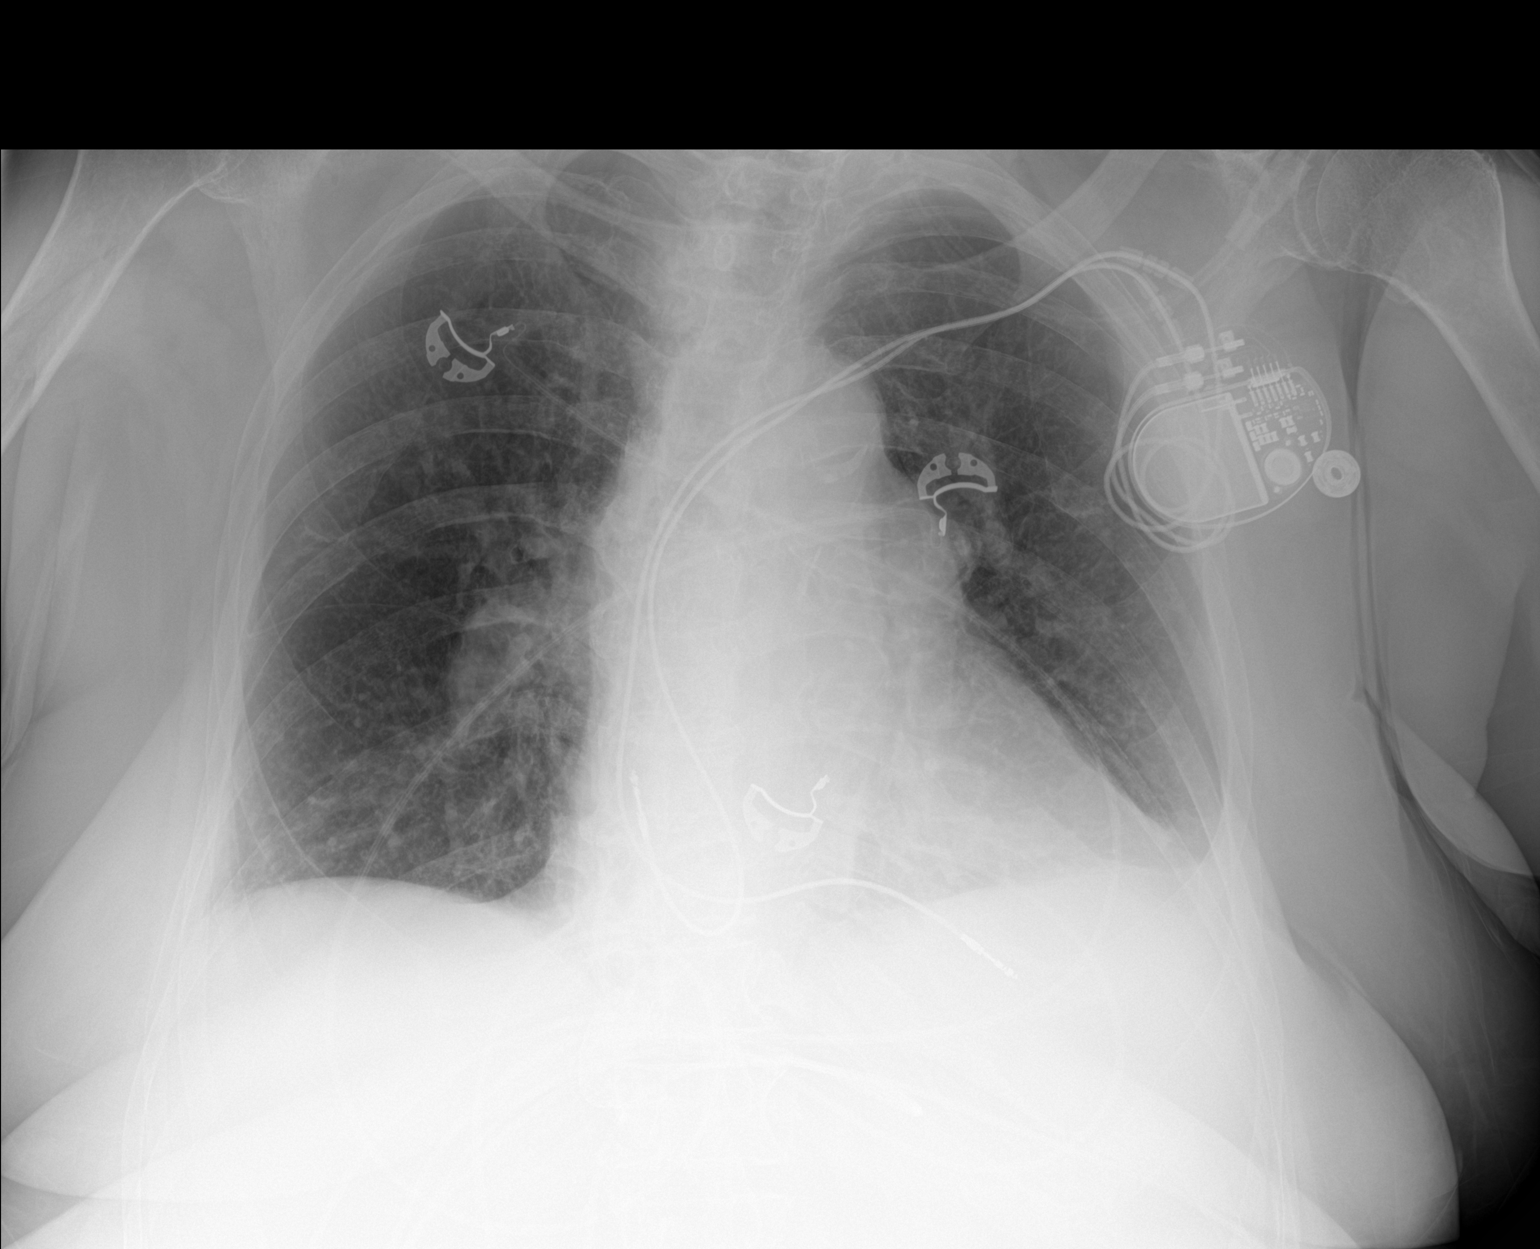

[1 of 1 positions shown; findings below may reference images not displayed]

FINDINGS: Rib fractures by prior CT are difficult to visualize
radiographically. No newly detected osseous trauma. Blunting of the
lateral left costophrenic sulcus from scarring. Normal heart size.
There are dual-chamber pacer leads from the left.
IMPRESSION: Known right-sided rib fractures. No pneumothorax or other acute or
interval finding.

## 2018-12-06 ENCOUNTER — Ambulatory Visit (INDEPENDENT_AMBULATORY_CARE_PROVIDER_SITE_OTHER): Payer: Medicare Other | Admitting: Adult Health

## 2018-12-06 ENCOUNTER — Encounter: Payer: Self-pay | Admitting: Adult Health

## 2018-12-06 ENCOUNTER — Other Ambulatory Visit: Payer: Self-pay

## 2018-12-06 DIAGNOSIS — G8929 Other chronic pain: Secondary | ICD-10-CM | POA: Diagnosis not present

## 2018-12-06 MED ORDER — DULOXETINE HCL 20 MG PO CPEP: 20.0000 mg | ORAL_CAPSULE | Freq: Every day | ORAL | 1 refills | Status: DC

## 2018-12-06 NOTE — Progress Notes (Signed)
Virtual Visit via Telephone Note  I connected with [name] on 12/06/18 at  9:30 AM EDT by telephone and verified that I am speaking with the correct person using two identifiers.   I discussed the limitations, risks, security and privacy concerns of performing an evaluation and management service by telephone and the availability of in person appointments.The staff discussed with the patient that there may be a patient responsible charge related to this service. The patient expressed understanding and agreed to proceed.   History of Present Illness: [name] calls in today for f/u on chronic pain-lumbar back pain with L side sciatica 10/20/2018-she was started on Duloxetine (Cymbalta) 20mg  once daily to augment Tramadol 50mg  1-2 tabs QD She reports overall pain level is 7/10, described as "numbing pain". Prior to starting to Duloxetine she would rate 8/10-9/10 BMP 10/2018, GFR >60 She denies thoughts of harming herself/others, she reports "feeling good" She reports home BP  SBP 130-140s DBP 70s She denies CP/increase in dyspnea/dizziness/HA/fever/palpitations She reports wearing home O2- 2L, every night during sleep, during day when she exerts herself She reports AM BG <140, she denies episodes of hypoglycemia, she denies GI upset   Patient Care Team    Relationship Specialty Notifications Start End  Esaw Grandchild, NP PCP - General Family Medicine  09/21/18   Sanda Klein, MD PCP - Cardiology Cardiology Admissions 09/08/18     Patient Active Problem List   Diagnosis Date Noted  . Healthcare maintenance 10/20/2018  . Other chronic pain 10/20/2018  . Rib fracture 07/02/2018  . Diabetes mellitus type 2, controlled, without complications ([place]) 02/22/7627  . Acute respiratory failure with hypoxia ([place]) 07/01/2018  . Pacemaker battery depletion 05/26/2018  . Mild obesity 01/05/2016  . Chronic respiratory failure with hypoxia ([place]) 10/29/2015  . Morbid obesity ([place]) 10/29/2015   . Upper airway cough syndrome 03/04/2015  . COPD GOLD II 01/18/2015  . Pulmonary hypertension ([place])   . Dyspnea   . DOE (dyspnea on exertion)   . Sleep apnea   . Hypoxemia   . Acute respiratory failure with hypoxemia ([place])   . Acute pulmonary edema (HCC)   . Chronic diastolic heart failure ([place]) 12/06/2014  . Accelerated hypertension with diastolic congestive heart failure, NYHA class 3 ([place]) 12/06/2014  . CHB (complete heart block) ([place]) 09/19/2013  . Pacemaker - dual chamber St Jude, 2009 09/19/2013  . Essential hypertension 09/19/2013  . Normal coronary arteries angiography 2002 09/19/2013  . Hyperlipidemia 09/19/2013  . Varicose veins of lower extremities with other complications 31/51/7616     Past Medical History:  Diagnosis Date  . Arthritis    "hands, spine" (12/07/2014)  . CHB (complete heart block) ([place]) 09/19/2013  . CHF (congestive heart failure) ([place]) dx'd 12/06/2014  . Chronic pain    "since neck OR in 2007"  . Coronary artery disease    normal coronaries by 09/10/00 cath  . Diabetes mellitus    hga1c 7.1 no meds  . Dysrhythmia   . HTN (hypertension) 09/19/2013  . Hypercholesterolemia   . Hyperlipidemia 09/19/2013  . Hypertension   . Myocardial infarction ([place]) dx'd 2009  . Neuropathy of both feet   . Pacemaker   . Paralysis of right hand ([place]) 2007   "after spinal cord injury/OR"  . Shortness of breath    exertional  . Type II diabetes mellitus ([place])    "had it years ago; lost weight after neck OR in 2007; glucose went way down; recently dx'd as returned  recently" (12/07/2014)  . Urinary incontinence    "since 2007's OR"  . Venous insufficiency (chronic) (peripheral)      Past Surgical History:  Procedure Laterality Date  . ANTERIOR CERVICAL DECOMP/DISCECTOMY FUSION  2007  . BACK SURGERY    . CARDIAC CATHETERIZATION  09/10/2000   normal Coronary arteries, EF60%,   . CARDIAC PACEMAKER PLACEMENT  12/01/2007   St.Jude Zephyr Elta [place] #[id number], dual chamber   . CARDIOVASCULAR STRESS TEST  08/28/2000   Cardiolite perfusion study, EF58%, low risk study,   . DILATION AND CURETTAGE OF UTERUS  1960's  . KNEE ARTHROSCOPY Right   . LAPAROSCOPIC CHOLECYSTECTOMY  1995  . LEFT AND RIGHT HEART CATHETERIZATION WITH CORONARY ANGIOGRAM N/A 12/14/2014   Procedure: LEFT AND RIGHT HEART CATHETERIZATION WITH CORONARY ANGIOGRAM;  Surgeon: Peter M Martinique, MD;  Location: [facility] CATH LAB;  Service: Cardiovascular;  Laterality: N/A;  . Lower Ext. Dopplers  06/12/2011   no evidence of thrombus, all vessels normal in size, no evidence of insuffiency  . LUMBAR LAMINECTOMY/DECOMPRESSION MICRODISCECTOMY  03/02/2012   Procedure: LUMBAR LAMINECTOMY/DECOMPRESSION MICRODISCECTOMY 2 LEVELS;  Surgeon: Floyce Stakes, MD;  Location: [place] [facility] ORS;  Service: Neurosurgery;  Laterality: Bilateral;  Lumbar three, lumbar four, lumbar five Laminectomy/Foraminotomy  . NM MYOCAR PERF WALL MOTION  10/24/2008   protocol:Lexiscan, EF63%, normal study, scan negative for ischemia  . PPM GENERATOR CHANGEOUT N/A 05/26/2018   Procedure: PPM GENERATOR CHANGEOUT;  Surgeon: Sanda Klein, MD;  Location: [place] CV LAB;  Service: Cardiovascular;  Laterality: N/A;  . TRANSTHORACIC ECHOCARDIOGRAM  11/30/2007   normal echo, mean transaortic valve gradient 42mmHg     Family History  Problem Relation Age of Onset  . Diabetes Father   . Heart disease Father        before age 35  . Hyperlipidemia Father   . Hypertension Father   . Heart attack Father   . Congestive Heart Failure Father   . Diabetes Mother   . Heart disease Mother   . Hyperlipidemia Mother   . Hypertension Mother   . Congestive Heart Failure Mother   . Diabetes Brother   . Congestive Heart Failure Brother      Social History   Substance and Sexual Activity  Drug Use No     Social History   Substance and Sexual Activity  Alcohol Use No     Social History   Tobacco Use  Smoking Status Former Smoker  .  Packs/day: 1.00  . Years: 40.00  . Pack years: 40.00  . Types: Cigarettes  . Last attempt to quit: 03/02/1992  . Years since quitting: 26.7  Smokeless Tobacco Never Used     Outpatient Encounter Medications as of 12/06/2018  Medication Sig  . acetaminophen (TYLENOL) 500 MG tablet Take 500 mg by mouth 4 (four) times daily -  with meals and at bedtime.  Marland Kitchen aspirin EC 81 MG tablet Take 81 mg by mouth daily.  . DULoxetine (CYMBALTA) 20 MG capsule Take 1 capsule (20 mg total) by mouth daily.  . furosemide (LASIX) 40 MG tablet TAKE 1 TABLET (40 MG TOTAL) BY MOUTH DAILY. (Patient taking differently: Take 40 mg by mouth daily. )  . ibuprofen (ADVIL,MOTRIN) 200 MG tablet Take 200 mg by mouth every 6 (six) hours as needed.  Marland Kitchen KLOR-CON M20 20 MEQ tablet TAKE 1 TABLET (20 MEQ TOTAL) BY MOUTH DAILY.  Marland Kitchen losartan (COZAAR) 50 MG tablet Take 1 tablet (50 mg total) by mouth daily.  . meloxicam (MOBIC)  15 MG tablet Take 15 mg by mouth daily.  . metFORMIN (GLUCOPHAGE) 500 MG tablet 1/2 tablet nightly with dinner  . OXYGEN 2lpm with sleep and exertion [employer]  . pravastatin (PRAVACHOL) 40 MG tablet Take 40 mg by mouth daily.  Marland Kitchen tiZANidine (ZANAFLEX) 2 MG tablet Take 2 mg by mouth every 6 (six) hours as needed for muscle spasms.  . traMADol (ULTRAM) 50 MG tablet Take 1 tablet by mouth every 6 (six) hours as needed.  . famotidine (PEPCID) 20 MG tablet Take 20 mg by mouth at bedtime.  Marland Kitchen omeprazole (PRILOSEC) 20 MG capsule Take 20 mg by mouth every morning.   No facility-administered encounter medications on file as of 12/06/2018.     Allergies: Patient has no known allergies.  Body mass index is 29.37 kg/m.  Blood pressure (!) 145/77, height 5\' 4"  (1.626 m), weight 171 lb 1.6 oz (77.6 kg).   Observations/Objective: No acute distress noted over the telephone Pt demonstrated poor memory/confusion, was able to re-orient  Assessment and Plan: Duloxetine 20mg  QD has slightly reduced pain and mood is  stable Continue all medications as directed  Follow Diabetic Diet, remain as active as tolerated (chronic pain) Continue with Pulmonologist, Cardiologist, Orthopedic Specialist as directed Remain home during COVID-19 pandemic  Follow Up Instructions: 2 month f/u- need fasting labs at next OV Call with questions/concerns prior  I discussed the assessment and treatment plan with the patient. The patient was provided an opportunity to ask questions and all were answered. The patient agreed with the plan and demonstrated an understanding of the instructions.   The patient was advised to call back or seek an in-person evaluation if the symptoms worsen or if the condition fails to improve as anticipated.  I provided 27 minutes of non-face-to-face time during this encounter.   Esaw Grandchild, NP

## 2018-12-06 NOTE — Assessment & Plan Note (Signed)
Assessment and Plan: Duloxetine 20mg  QD has slightly reduced pain and mood is stable Continue all medications as directed  Follow Diabetic Diet, remain as active as tolerated (chronic pain) Continue with Pulmonologist, Cardiologist, Orthopedic Specialist as directed Remain home during COVID-19 pandemic  Follow Up Instructions: 2 month f/u- need fasting labs at next OV Call with questions/concerns prior  I discussed the assessment and treatment plan with the patient. The patient was provided an opportunity to ask questions and all were answered. The patient agreed with the plan and demonstrated an understanding of the instructions.   The patient was advised to call back or seek an in-person evaluation if the symptoms worsen or if the condition fails to improve as anticipated.  I provided 27 minutes of non-face-to-face time during this encounter.

## 2018-12-08 ENCOUNTER — Telehealth: Payer: Self-pay

## 2018-12-08 ENCOUNTER — Ambulatory Visit (INDEPENDENT_AMBULATORY_CARE_PROVIDER_SITE_OTHER): Payer: Medicare Other | Admitting: *Deleted

## 2018-12-08 ENCOUNTER — Other Ambulatory Visit: Payer: Self-pay

## 2018-12-08 DIAGNOSIS — I442 Atrioventricular block, complete: Secondary | ICD-10-CM

## 2018-12-08 DIAGNOSIS — I5032 Chronic diastolic (congestive) heart failure: Secondary | ICD-10-CM

## 2018-12-08 LAB — CUP PACEART REMOTE DEVICE CHECK
Battery Remaining Longevity: 113 mo
Battery Remaining Percentage: 95.5 %
Battery Voltage: 3.01 V
Brady Statistic AP VP Percent: 13 %
Brady Statistic AP VS Percent: 1 %
Brady Statistic AS VP Percent: 87 %
Brady Statistic AS VS Percent: 1 %
Brady Statistic RA Percent Paced: 13 %
Brady Statistic RV Percent Paced: 99 %
Date Time Interrogation Session: 20200408121633
Implantable Lead Implant Date: 20090401
Implantable Lead Implant Date: 20090401
Implantable Lead Location: 753859
Implantable Lead Location: 753860
Implantable Pulse Generator Implant Date: 20190925
Lead Channel Impedance Value: 440 Ohm
Lead Channel Impedance Value: 450 Ohm
Lead Channel Pacing Threshold Amplitude: 0.5 V
Lead Channel Pacing Threshold Amplitude: 1 V
Lead Channel Pacing Threshold Pulse Width: 0.5 ms
Lead Channel Pacing Threshold Pulse Width: 0.5 ms
Lead Channel Sensing Intrinsic Amplitude: 12 mV
Lead Channel Sensing Intrinsic Amplitude: 2.2 mV
Lead Channel Setting Pacing Amplitude: 1.25 V
Lead Channel Setting Pacing Amplitude: 2 V
Lead Channel Setting Pacing Pulse Width: 0.5 ms
Lead Channel Setting Sensing Sensitivity: 4 mV
Pulse Gen Model: 2272
Pulse Gen Serial Number: 9064942

## 2018-12-08 NOTE — Telephone Encounter (Signed)
Spoke with patient to remind of missed remote transmission 

## 2018-12-17 NOTE — Progress Notes (Signed)
Remote pacemaker transmission.   

## 2019-01-04 ENCOUNTER — Other Ambulatory Visit: Payer: Self-pay | Admitting: Adult Health

## 2019-01-18 ENCOUNTER — Ambulatory Visit: Payer: Medicare Other | Admitting: Adult Health

## 2019-02-24 ENCOUNTER — Telehealth: Payer: Self-pay | Admitting: Internal Medicine

## 2019-02-24 ENCOUNTER — Ambulatory Visit (INDEPENDENT_AMBULATORY_CARE_PROVIDER_SITE_OTHER): Payer: Medicare Other | Admitting: Pulmonary Disease

## 2019-02-24 ENCOUNTER — Telehealth: Payer: Self-pay

## 2019-02-24 ENCOUNTER — Encounter: Payer: Self-pay | Admitting: Pulmonary Disease

## 2019-02-24 ENCOUNTER — Other Ambulatory Visit: Payer: Self-pay

## 2019-02-24 DIAGNOSIS — J449 Chronic obstructive pulmonary disease, unspecified: Secondary | ICD-10-CM

## 2019-02-24 MED ORDER — AZITHROMYCIN 250 MG PO TABS: ORAL_TABLET | ORAL | 0 refills | Status: DC

## 2019-02-24 MED ORDER — PREDNISONE 10 MG PO TABS: ORAL_TABLET | ORAL | 0 refills | Status: DC

## 2019-02-24 NOTE — Telephone Encounter (Signed)
Primary Pulmonologist: MW Last office visit and with whom: 11/10/2018 with MW What do we see them for (pulmonary problems): COPD Last OV assessment/plan: Assessment & Plan Note by Tanda Rockers, MD at 11/12/2018 6:19 AM Author: Tanda Rockers, MD Author Type: Physician Filed: 11/12/2018 6:21 AM  Note Status: Written Cosign: Cosign Not Required Encounter Date: 11/10/2018  Problem: Upper airway cough syndrome  Editor: Tanda Rockers, MD (Physician)    Trial off acei 01/18/15 > marginally improved sob/ globus - trial of max gerd rx 10/29/2015 > ent eval rec but never done  - Allergy profile 10/14/18  >  Eos 0.0 /  IgE  29 RAST neg   Improved but was using tramadol for pain p fell and is still relying on it too much so rec  1) wean tramadol 2) continue h2 bid 3) Bed blocks demonstrated on line availability as very cheap potential benefit to supplement diet.   Each maintenance medication was reviewed in detail including most importantly the difference between maintenance and as needed and under what circumstances the prns are to be used.  Please see AVS for specific  Instructions which are unique to this visit and I personally typed out  which were reviewed in detail in writing with the patient and a copy provided.     F/u q 6 m, sooner prn     Assessment & Plan Note by Tanda Rockers, MD at 11/12/2018 6:18 AM Author: Tanda Rockers, MD Author Type: Physician Filed: 11/12/2018 6:18 AM  Note Status: Written Cosign: Cosign Not Required Encounter Date: 11/10/2018  Problem: Chronic respiratory failure with hypoxia ([place])  Editor: Tanda Rockers, MD (Physician)    Noct 02 2lpm since admit 12/2014  - 10/29/2015   Walked RA x one lap @ 185 stopped due to  Weak leg no sob, sat still 89%  - ONO  11/01/15 RA >  No desat - as of 11/10/2018 using 2lpm hs and with activity   Adequate control on present rx, reviewed in detail with pt > no change in rx needed  > rec monitor sats at end of ex with  goal of keeping > 90%     Assessment & Plan Note by Tanda Rockers, MD at 11/12/2018 6:17 AM Author: Tanda Rockers, MD Author Type: Physician Filed: 11/12/2018 6:18 AM  Note Status: Written Cosign: Cosign Not Required Encounter Date: 11/10/2018  Problem: DOE (dyspnea on exertion)  Editor: Tanda Rockers, MD (Physician)    Onset spring 2016 - Note PCWP 24 at cath 12/14/14  - Trial off acei 01/18/2015 > marginally improved 02/29/16  - 01/18/2015   Walked RA x one lap @ 185 stopped due to  Sob but no desat, slow to nl  Pace - 03/01/2015   Walked RA x one lap @ 185 stopped due to  fatigue > sob/ no desat/ moderate pace    Offered pulmonary rehab but declined     Patient Instructions by Tanda Rockers, MD at 11/10/2018 4:15 PM Author: Tanda Rockers, MD Author Type: Physician Filed: 11/10/2018 4:37 PM  Note Status: Addendum Cosign: Cosign Not Required Encounter Date: 11/10/2018  Editor: Tanda Rockers, MD (Physician)  Prior Versions: 1. Tanda Rockers, MD (Physician) at 11/10/2018 4:35 PM - Signed    Use bed blocks x 6-8 inches and try to wean the tramadol some if you can   Goal for 02 is to keep around 90% or above   Please schedule  a follow up visit in 6  months but call sooner if needed      Was appointment offered to patient (explain)?  Pt wants meds prescribed   Reason for call: Called and spoke with pt. Pt stated she began having SOB overnight 6/24 and also began to cough up mucus but she is unsure of the color of the mucus.  Pt is on 2L of O2 which she does use all the time. Pt has not had to up her O2 for any reason.  Pt has not taken any meds for the cough OTC as she has not been out of the house since [place] began.  Pt denies any complaints of loss of smell, no headaches, no weakness, no diarrhea or muscle pain, no abdominal pain, fever, or rash, no sore throat, bruising/bleeding, or joint pain, no red eye, or vomiting.  Pt has not had to be tested for COVID and has  not been around anyone that has either been sick or exposed to [place].  Pt is taking the prilosec and pepcid as well as her other medications as prescribed.  Pt is requesting to have meds prescribed to help with her symptoms as she wants to get this under control before it becomes worse. Aaron Edelman, please advise on this for pt. Thank you!

## 2019-02-24 NOTE — Assessment & Plan Note (Signed)
Assessment: Former smoker 2016 pulmonary function test shows COPD Gold 2, no bronchodilator response, DLCO 30 Not on any sort of maintenance inhalers Worsening shortness of breath over the last 3 to 4 days with increased mucus production and cough  Discussion: Likely COPD exacerbation versus bronchitis-like episode.  We will treat as COPD exacerbation today.  Due to shortness of breath will also include short course of prednisone.  Plan: Prednisone taper today Z-Pak 4-week follow-up with Dr. Melvyn Novas  Emphasized the importance to the patient as well as patient's son that if symptoms are not improving under this current regimen to contact our office as she will need to be considered for outpatient PEC testing.

## 2019-02-24 NOTE — Telephone Encounter (Signed)
Schedule tele-visit to be completed with APP today or tomorrow to review patient's symptoms.  Sounds like potential COPD exacerbation but may also need to consider outpatient PEC testing if patient symptoms are worsening and will need to actually bring into the office.  Wyn Quaker, FNP

## 2019-02-24 NOTE — Telephone Encounter (Signed)
Called and spoke with pt letting her know that we needed to get her scheduled for televisit. Pt expressed understanding. appt scheduled. Nothing further needed.

## 2019-02-24 NOTE — Progress Notes (Signed)
Virtual Visit via Telephone Note  I connected with [name] on 02/24/19 at 12:00 PM EDT by telephone and verified that I am speaking with the correct person using two identifiers.  Location: Patient: Home Provider: [facility] - [address], [place], [place], [place] [address]   I discussed the limitations, risks, security and privacy concerns of performing an evaluation and management service by telephone and the availability of in person appointments. I also discussed with the patient that there may be a patient responsible charge related to this service. The patient expressed understanding and agreed to proceed.  Patient consented to consult via telephone: Yes People present and their role in pt care: Pt     History of Present Illness:  78 year old female former smoker followed in our office for COPD Gold 2 as well as chronic cough Maintenance: None Patient of Dr. Melvyn Novas  Chief complaint: Shortness of breath, wheezing   78 year old female contacted our office today with her son on the line to report worsened breathing over the last 3 to 4 days.  They reported that the shortness of breath worsened on 02/20/2019.  Patient does not have any audible wheezing.  She has had increased cough as well as mucus production.  Unfortunately she cannot describe to me over the phone what color the mucus is that she continues to swallow it.  She is not on any sort of maintenance inhalers.  She reports she has not had any sort of contact with anyone with COVID-19 this patient has been self quarantining since March/2020.  Her son or her daughter-in-law runs all of her errands for her.  She has been off of her tramadol for her cough for some time now.  She also has not had any recent antibiotics.  Patient denies fevers chills or nausea vomiting diarrhea. Assessment Observations/Objective:  Pending pulmonary function test ordered February/2020  12/13/2014-pulmonary function test- FVC  1.87 (69% predicted), postbronchodilator ratio 59, postbronchodilator FEV1 1.01 (50% predicted), no bronchodilator response, DLCO 30  Assessment and Plan:  COPD GOLD II Assessment: Former smoker 2016 pulmonary function test shows COPD Gold 2, no bronchodilator response, DLCO 30 Not on any sort of maintenance inhalers Worsening shortness of breath over the last 3 to 4 days with increased mucus production and cough  Discussion: Likely COPD exacerbation versus bronchitis-like episode.  We will treat as COPD exacerbation today.  Due to shortness of breath will also include short course of prednisone.  Plan: Prednisone taper today Z-Pak 4-week follow-up with Dr. Melvyn Novas  Emphasized the importance to the patient as well as patient's son that if symptoms are not improving under this current regimen to contact our office as she will need to be considered for outpatient PEC testing.   Follow Up Instructions:  Return in about 4 weeks (around 03/24/2019), or if symptoms worsen or fail to improve, for Follow up with Dr. Melvyn Novas.   I discussed the assessment and treatment plan with the patient. The patient was provided an opportunity to ask questions and all were answered. The patient agreed with the plan and demonstrated an understanding of the instructions.   The patient was advised to call back or seek an in-person evaluation if the symptoms worsen or if the condition fails to improve as anticipated.  I provided 22 minutes of non-face-to-face time during this encounter.   Lauraine Rinne, NP

## 2019-02-24 NOTE — Telephone Encounter (Signed)
Pt called stating that she is having increased SOB with exertion, cough with phlegm, but denies fever, chills, myalgias, nausea, vomiting, and diarrhea.  Advised pt that since she sees Dr. Melvyn Novas, pulmonologist, she needs to call his office immediately after terminating call with myself to determine if she needs OV, telemedicine visit, or ED.  Pt expressed understanding and is agreeable.  Pt provided with Dr. Gustavus Bryant office number.  Charyl Bigger, CMA

## 2019-02-24 NOTE — Patient Instructions (Signed)
Prednisone 10mg  tablet  >>>4 tabs for 2 days, then 3 tabs for 2 days, 2 tabs for 2 days, then 1 tab for 2 days, then stop >>>take with food  >>>take in the morning   Azithromycin 250mg  tablet  >>>Take 2 tablets (500mg  total) today, and then 1 tablet (250mg ) for the next four days  >>>take with food  >>>can also take probiotic and / or yogurt while on antibiotic

## 2019-03-02 ENCOUNTER — Telehealth: Payer: Self-pay | Admitting: Pulmonary Disease

## 2019-03-02 ENCOUNTER — Telehealth: Payer: Self-pay | Admitting: Internal Medicine

## 2019-03-02 DIAGNOSIS — J449 Chronic obstructive pulmonary disease, unspecified: Secondary | ICD-10-CM

## 2019-03-02 MED ORDER — AZITHROMYCIN 250 MG PO TABS: ORAL_TABLET | ORAL | 1 refills | Status: DC

## 2019-03-02 NOTE — Telephone Encounter (Signed)
Called and spoke to pt. Pt states overall she is feeling better since she was given the abx but is requesting a refill as she is not back to baseline. Advised pt that the medications will continue to work after finishing them, but pt does not want to go over the long weekend without anything on hand. Pt is still c/o SOB (slightly dyspneic on the phone when speaking but was talking in long sentences), and prod cough with tan mucus. Pt states before taking the medication she was not producing any mucus. Pt denies f/c/s and swelling. Pt states she occasionally gets chest tightness when dyspneic but resolves with rest. Pt took her spo2 and was 97% on 2lpm while on the phone.   Pt requesting recs today, will forward to MW to address. Thank you.

## 2019-03-02 NOTE — Telephone Encounter (Signed)
Called and spoke with pt letting her know that MW said to refill her zpak and also place 1 refill on it. Pt verbalized understanding. I sent Rx in for pt. Nothing further needed.

## 2019-03-02 NOTE — Telephone Encounter (Signed)
03/02/2019 0849  Can we please contact the patient and see how she is doing following her telephonic visit last week.  If symptoms are not improving we need to consider outpatient PEC testing.  Wyn Quaker FNP

## 2019-03-02 NOTE — Telephone Encounter (Signed)
Patient states she is feeling better and is almost done with all her medication that was given to her. I told her if she starts to feel worse please to call us back.  Nothing further needed at this time

## 2019-03-02 NOTE — Telephone Encounter (Signed)
Ok to refill zpak x one

## 2019-03-09 ENCOUNTER — Ambulatory Visit (INDEPENDENT_AMBULATORY_CARE_PROVIDER_SITE_OTHER): Payer: BLUE CROSS/BLUE SHIELD | Admitting: *Deleted

## 2019-03-09 DIAGNOSIS — I442 Atrioventricular block, complete: Secondary | ICD-10-CM | POA: Diagnosis not present

## 2019-03-09 LAB — CUP PACEART REMOTE DEVICE CHECK
Battery Remaining Longevity: 110 mo
Battery Remaining Percentage: 95.5 %
Battery Voltage: 2.99 V
Brady Statistic AP VP Percent: 9 %
Brady Statistic AP VS Percent: 1 %
Brady Statistic AS VP Percent: 90 %
Brady Statistic AS VS Percent: 1 %
Brady Statistic RA Percent Paced: 8.3 %
Brady Statistic RV Percent Paced: 99 %
Date Time Interrogation Session: 20200708060013
Implantable Lead Implant Date: 20090401
Implantable Lead Implant Date: 20090401
Implantable Lead Location: 753859
Implantable Lead Location: 753860
Implantable Pulse Generator Implant Date: 20190925
Lead Channel Impedance Value: 400 Ohm
Lead Channel Impedance Value: 400 Ohm
Lead Channel Pacing Threshold Amplitude: 0.5 V
Lead Channel Pacing Threshold Amplitude: 1 V
Lead Channel Pacing Threshold Pulse Width: 0.5 ms
Lead Channel Pacing Threshold Pulse Width: 0.5 ms
Lead Channel Sensing Intrinsic Amplitude: 1.5 mV
Lead Channel Sensing Intrinsic Amplitude: 12 mV
Lead Channel Setting Pacing Amplitude: 1.25 V
Lead Channel Setting Pacing Amplitude: 2 V
Lead Channel Setting Pacing Pulse Width: 0.5 ms
Lead Channel Setting Sensing Sensitivity: 4 mV
Pulse Gen Model: 2272
Pulse Gen Serial Number: 9064942

## 2019-03-14 NOTE — Progress Notes (Signed)
Chart and office note reviewed in detail  > agree with a/p as outlined    

## 2019-03-21 ENCOUNTER — Encounter: Payer: Self-pay | Admitting: Cardiology

## 2019-03-21 NOTE — Progress Notes (Signed)
Remote pacemaker transmission.   

## 2019-03-28 ENCOUNTER — Ambulatory Visit: Payer: Medicare Other | Admitting: Internal Medicine

## 2019-03-31 ENCOUNTER — Telehealth: Payer: Self-pay | Admitting: Internal Medicine

## 2019-03-31 DIAGNOSIS — R0609 Other forms of dyspnea: Secondary | ICD-10-CM

## 2019-03-31 DIAGNOSIS — J449 Chronic obstructive pulmonary disease, unspecified: Secondary | ICD-10-CM

## 2019-03-31 MED ORDER — PREDNISONE 10 MG PO TABS: ORAL_TABLET | ORAL | 0 refills | Status: DC

## 2019-03-31 MED ORDER — DOXYCYCLINE HYCLATE 100 MG PO TABS: 100.0000 mg | ORAL_TABLET | Freq: Two times a day (BID) | ORAL | 0 refills | Status: DC

## 2019-03-31 NOTE — Telephone Encounter (Signed)
Primary Pulmonologist: [name]  Last office visit and with whom: Televisit with [name] on 02/24/19 What do we see them for (pulmonary problems): COPD Gold 2 Last OV assessment/plan: Assessment: Former smoker 2016 pulmonary function test shows COPD Gold 2, no bronchodilator response, DLCO 30 Not on any sort of maintenance inhalers Worsening shortness of breath over the last 3 to 4 days with increased mucus production and cough  Discussion: Likely COPD exacerbation versus bronchitis-like episode.  We will treat as COPD exacerbation today.  Due to shortness of breath will also include short course of prednisone.  Plan: Prednisone taper today Z-Pak 4-week follow-up with [name]  Was appointment offered to patient (explain)?  Pt afraid to come in due to concerns of Covid-19   Reason for call: Pt c/o increased cough and sensation of mucus in her throat x 2 days. She states 99% of the time she swallows her sputum but was she has seen is "not dark" "maybe tan"- feels like she cough is brought on more when she exerts herself. She is having some DOE but only with doing housework. She states no CP or tightness, wheezing, chills, fever, (does not have therm to check temp while on phone with me), N/V/D, loss of taste/smell, joint pain, muscle aches, sick contacts, recent travel. She has not been taking her pepcid at bedtime and seems unsure about taking omeprazole in the morning. She never f/u with MW in July b./c she fears leaving the house due to covid concerns. [name], please advise, thanks!  (examples of things to ask: : When did symptoms start? Fever? Cough? Productive? Color to sputum? More sputum than usual? Wheezing? Have you needed increased oxygen? Are you taking your respiratory medications? What over the counter measures have you tried?)

## 2019-03-31 NOTE — Addendum Note (Signed)
Addended by: Rosana Berger on: 03/31/2019 12:01 PM   Modules accepted: Orders

## 2019-03-31 NOTE — Telephone Encounter (Signed)
I have spoke with the pt and notified of all of [name]'s recs and she verbalized understanding  She will go to [facility] for covid testing- order placed  Rxs sent to pharm  Appt with MW for 04/14/19 scheduled  Will change if needed based on results

## 2019-03-31 NOTE — Telephone Encounter (Signed)
I can respect the patient's fears of SARS-CoV-2.  I would recommend that she complete follow-up with our office.  It is very difficult to assess and manage her telephonically.  As patient does not have my chart to complete video visits.  I believe the patient needs in office visits for further evaluation.  This can be completed by primary care or Dr. Melvyn Novas, or an APP in our office.  Patient also needs outpatient COVID testing to further evaluate symptoms.  She has had 2 treatments of Z-Paks based off of chart review.  Can place order for:   Doxycycline >>> 1 100 mg tablet every 12 hours for 7 days >>>take with food  >>>wear sunscreen   Prednisone 10mg  tablet  >>>4 tabs for 2 days, then 3 tabs for 2 days, 2 tabs for 2 days, then 1 tab for 2 days, then stop >>>take with food  >>>take in the morning    Please place the order for [id number] for COVID testing.  Place order as normal and lab collect.  Appointments are not required and please instruct the patient to present to 1 of these testing sites listed below whichever is closest and most convenient for the patient.  COVID Testing Site Locations (For sick patients only, pre-procedure is done differently)  . [facility] [address] [place], [place] [address] . [place]  (on [employer])  . [place] [address], [place] (across from [facility] Emergency Department)   Patient likely needs sputum cultured at next office visit.  Please schedule patient for in office visit with Dr. Melvyn Novas or an APP after COVID test results are confirmed negative.  May be beneficial to schedule patient for a 2-week follow-up with Dr. Melvyn Novas in office which could be changed to telephonic if in fact COVID positive.  Please also route to Dr. Melvyn Novas as [facility].  Wyn Quaker, FNP

## 2019-04-05 ENCOUNTER — Other Ambulatory Visit: Payer: Self-pay

## 2019-04-05 DIAGNOSIS — R6889 Other general symptoms and signs: Secondary | ICD-10-CM | POA: Diagnosis not present

## 2019-04-05 DIAGNOSIS — Z20822 Contact with and (suspected) exposure to covid-19: Secondary | ICD-10-CM

## 2019-04-06 LAB — NOVEL CORONAVIRUS, NAA: SARS-CoV-2, NAA: NOT DETECTED

## 2019-04-14 ENCOUNTER — Ambulatory Visit: Payer: Medicare Other | Admitting: Internal Medicine

## 2019-04-21 ENCOUNTER — Telehealth: Payer: Self-pay | Admitting: Adult Health

## 2019-04-21 DIAGNOSIS — J449 Chronic obstructive pulmonary disease, unspecified: Secondary | ICD-10-CM

## 2019-04-21 DIAGNOSIS — R0902 Hypoxemia: Secondary | ICD-10-CM

## 2019-04-21 DIAGNOSIS — R0600 Dyspnea, unspecified: Secondary | ICD-10-CM

## 2019-04-21 NOTE — Telephone Encounter (Signed)
Patient's brother Ron called on behalf of patient (DPR on file), they need a new oxygen mask DME order sent to [facility], the one she has now is rubbing her face raw, she is hoping for the "cup" style mask if at all possible. Ron stated that if you need any additional information to contact him directly at [phone].

## 2019-04-25 NOTE — Addendum Note (Signed)
Addended by: Fonnie Mu on: 04/25/2019 11:10 AM   Modules accepted: Orders

## 2019-04-25 NOTE — Telephone Encounter (Signed)
Ordered and faxed to [facility].  Charyl Bigger, CMA

## 2019-04-27 ENCOUNTER — Telehealth: Payer: Self-pay | Admitting: Adult Health

## 2019-04-27 DIAGNOSIS — J449 Chronic obstructive pulmonary disease, unspecified: Secondary | ICD-10-CM

## 2019-04-27 NOTE — Telephone Encounter (Signed)
Continues to have cough, congestion and dyspnea. Has received abx and steroids x 2 for last 2 months . Requesting refills.  Advised needs office visit to be seen and further evaluation and tx options.  Please call patient and place on office visit with [place] NP 04/27/19 . Needs Chest xray prior to visit   Sx ongoing for >2-3 months , gets some better on abx /steroids but sx return. No inhalers.  No fever, chest pain, edema , orthopnea.   COVID 19 test neg on 04/05/19 .   Please contact office for sooner follow up if symptoms do not improve or worsen or seek emergency care

## 2019-04-28 ENCOUNTER — Ambulatory Visit (INDEPENDENT_AMBULATORY_CARE_PROVIDER_SITE_OTHER): Payer: Medicare Other

## 2019-04-28 ENCOUNTER — Encounter: Payer: Self-pay | Admitting: Adult Health

## 2019-04-28 ENCOUNTER — Other Ambulatory Visit: Payer: Self-pay

## 2019-04-28 ENCOUNTER — Ambulatory Visit (INDEPENDENT_AMBULATORY_CARE_PROVIDER_SITE_OTHER): Payer: Medicare Other | Admitting: Adult Health

## 2019-04-28 ENCOUNTER — Ambulatory Visit: Payer: Medicare Other | Admitting: Adult Health

## 2019-04-28 ENCOUNTER — Telehealth: Payer: Self-pay | Admitting: Internal Medicine

## 2019-04-28 VITALS — BP 138/70 | HR 68 | Temp 98.5°F | Ht 64.0 in | Wt 164.0 lb

## 2019-04-28 DIAGNOSIS — J9611 Chronic respiratory failure with hypoxia: Secondary | ICD-10-CM

## 2019-04-28 DIAGNOSIS — I5032 Chronic diastolic (congestive) heart failure: Secondary | ICD-10-CM | POA: Diagnosis not present

## 2019-04-28 DIAGNOSIS — J449 Chronic obstructive pulmonary disease, unspecified: Secondary | ICD-10-CM | POA: Diagnosis not present

## 2019-04-28 DIAGNOSIS — R0602 Shortness of breath: Secondary | ICD-10-CM | POA: Diagnosis not present

## 2019-04-28 MED ORDER — STIOLTO RESPIMAT 2.5-2.5 MCG/ACT IN AERS: 2.0000 | INHALATION_SPRAY | Freq: Every day | RESPIRATORY_TRACT | 0 refills | Status: DC

## 2019-04-28 NOTE — Progress Notes (Signed)
@Patient  ID: [name], female    DOB: 02/07/1941, 78 y.o.   MRN: [id number]  Chief Complaint  Patient presents with  . Follow-up    COPD, increased shortness of breath. Sounds congested.    Referring provider: Esaw Grandchild, NP  HPI: 78 year old female former smoker followed for moderate COPD , GOLD II COPD and Oxygen dependent RF on Oxygen 2l/m  Medical hx significant , chronic back pain, DJD/DDD, HTN, CHF , Pacemaker.   TEST/EVENTS :   12/13/2014-pulmonary function test- FVC 1.87 (69% predicted), postbronchodilator ratio 59, postbronchodilator FEV1 1.01 (50% predicted), no bronchodilator response, DLCO 30  CT chest October 2019 showed mild atelectasis in the left lung base.  Otherwise clear.  Displaced right eighth and ninth rib fracture  2D echo April 2016 EF 50 to 55%  04/28/2019 Follow up : COPD  Patient presents for a 63-month follow-up of COPD.  Patient had recurrent COPD exacerbations over the last couple months.  She received a Z-Pak and a prednisone taper initially.  Says her symptoms got slightly better but then symptoms returned.  Symptoms increased about 1 month ago and patient was called in a course of doxycycline and a prednisone taper.  COVID-19 testing was negative Patient complains that when she gets off of antibiotics and steroids her symptoms start to slowly return and progressively increase.  She complains of increased cough congestion and wheezing.  Also has increased shortness of breath.  Patient gets winded with minimal activity.  She is not on any maintenance inhalers at home On oxygen 2l/m .   Chest xray today shows increased vascular congestion and small bilateral effusions.   Had fall 06/2018 with rib fractures, feels has slowly worsens since then.  Has had progressive weight loss. Down 35lbs . Has decreased appetite .   No Known Allergies  Immunization History  Administered Date(s) Administered  . Influenza Split 06/01/2014, 06/02/2015  .  Influenza, High Dose Seasonal PF 06/04/2018  . Pneumococcal Polysaccharide-23 06/01/2006    Past Medical History:  Diagnosis Date  . Arthritis    "hands, spine" (12/07/2014)  . CHB (complete heart block) ([place]) 09/19/2013  . CHF (congestive heart failure) ([place]) dx'd 12/06/2014  . Chronic pain    "since neck OR in 2007"  . Coronary artery disease    normal coronaries by 09/10/00 cath  . Diabetes mellitus    hga1c 7.1 no meds  . Dysrhythmia   . HTN (hypertension) 09/19/2013  . Hypercholesterolemia   . Hyperlipidemia 09/19/2013  . Hypertension   . Myocardial infarction ([place]) dx'd 2009  . Neuropathy of both feet   . Pacemaker   . Paralysis of right hand ([place]) 2007   "after spinal cord injury/OR"  . Shortness of breath    exertional  . Type II diabetes mellitus ([place])    "had it years ago; lost weight after neck OR in 2007; glucose went way down; recently dx'd as returned recently" (12/07/2014)  . Urinary incontinence    "since 2007's OR"  . Venous insufficiency (chronic) (peripheral)     Tobacco History: Social History   Tobacco Use  Smoking Status Former Smoker  . Packs/day: 0.50  . Years: 32.00  . Pack years: 16.00  . Types: Cigarettes  . Start date: 1963  . Quit date: 03/02/1994  . Years since quitting: 25.1  Smokeless Tobacco Never Used   Counseling given: Not Answered   Outpatient Medications Prior to Visit  Medication Sig Dispense Refill  . acetaminophen (TYLENOL) 500 MG  tablet Take 500 mg by mouth 4 (four) times daily -  with meals and at bedtime.    Marland Kitchen aspirin EC 81 MG tablet Take 81 mg by mouth daily.    . DULoxetine (CYMBALTA) 20 MG capsule Take 1 capsule (20 mg total) by mouth daily. 90 capsule 1  . famotidine (PEPCID) 20 MG tablet Take 20 mg by mouth at bedtime.    . furosemide (LASIX) 40 MG tablet TAKE 1 TABLET (40 MG TOTAL) BY MOUTH DAILY. (Patient taking differently: Take 40 mg by mouth daily. ) 30 tablet 10  . ibuprofen (ADVIL,MOTRIN) 200 MG tablet Take 200 mg  by mouth every 6 (six) hours as needed.    Marland Kitchen KLOR-CON M20 20 MEQ tablet TAKE 1 TABLET (20 MEQ TOTAL) BY MOUTH DAILY. 30 tablet 6  . losartan (COZAAR) 50 MG tablet Take 1 tablet (50 mg total) by mouth daily. 90 tablet 3  . meloxicam (MOBIC) 15 MG tablet Take 15 mg by mouth daily.    . metFORMIN (GLUCOPHAGE) 500 MG tablet 1/2 tablet nightly with dinner 90 tablet 1  . omeprazole (PRILOSEC) 20 MG capsule Take 20 mg by mouth every morning.    . OXYGEN 2lpm with sleep and exertion [employer]    . pravastatin (PRAVACHOL) 40 MG tablet Take 40 mg by mouth daily.    Marland Kitchen tiZANidine (ZANAFLEX) 2 MG tablet Take 2 mg by mouth every 6 (six) hours as needed for muscle spasms.    Marland Kitchen azithromycin (ZITHROMAX) 250 MG tablet 500mg  (two tablets) today, then 250mg  (1 tablet) for the next 4 days (Patient not taking: Reported on 04/28/2019) 6 tablet 1  . doxycycline (VIBRA-TABS) 100 MG tablet Take 1 tablet (100 mg total) by mouth 2 (two) times daily. (Patient not taking: Reported on 04/28/2019) 14 tablet 0  . predniSONE (DELTASONE) 10 MG tablet 4 tabs for 2 days, then 3 tabs for 2 days, 2 tabs for 2 days, then 1 tab for 2 days, then stop (Patient not taking: Reported on 04/28/2019) 20 tablet 0  . traMADol (ULTRAM) 50 MG tablet Take 1 tablet by mouth every 6 (six) hours as needed.     No facility-administered medications prior to visit.      Review of Systems:   Constitutional:   No  weight loss, night sweats,  Fevers, chills, fatigue, or  lassitude.  HEENT:   No headaches,  Difficulty swallowing,  Tooth/dental problems, or  Sore throat,                No sneezing, itching, ear ache, nasal congestion, post nasal drip,   CV:  No chest pain,  Orthopnea, PND, swelling in lower extremities, anasarca, dizziness, palpitations, syncope.   GI  No heartburn, indigestion, abdominal pain, nausea, vomiting, diarrhea, change in bowel habits, loss of appetite, bloody stools.   Resp: No shortness of breath with exertion or at rest.   No excess mucus, no productive cough,  No non-productive cough,  No coughing up of blood.  No change in color of mucus.  No wheezing.  No chest wall deformity  Skin: no rash or lesions.  GU: no dysuria, change in color of urine, no urgency or frequency.  No flank pain, no hematuria   MS:  No joint pain or swelling.  No decreased range of motion.  No back pain.    Physical Exam  BP 138/70 (BP Location: Left Arm, Patient Position: Sitting, Cuff Size: Normal)   Pulse 68   Temp 98.5 F (36.9  C)   Ht 5\' 4"  (1.626 m)   Wt 164 lb (74.4 kg)   SpO2 99% Comment: on 2L pulse  BMI 28.15 kg/m   GEN: A/Ox3; pleasant , NAD, frail and elderly in wc on O2    HEENT:  [place]/AT,  , NOSE-clear, THROAT-clear, no lesions, no postnasal drip or exudate noted.   NECK:  Supple w/ fair ROM; no JVD; normal carotid impulses w/o bruits; no thyromegaly or nodules palpated; no lymphadenopathy.    RESP  Few trace rhonchi   no accessory muscle use, no dullness to percussion  CARD:  RRR, no m/r/g, tr to 1+  peripheral edema, pulses intact, no cyanosis or clubbing.  GI:   Soft & nt; nml bowel sounds; no organomegaly or masses detected.   Musco: Warm bil, no deformities or joint swelling noted.   Neuro: alert, no focal deficits noted.    Skin: Warm, no lesions or rashes    Lab Results:  CBC    Component Value Date/Time   WBC 15.2 (H) 10/13/2018 1629   RBC 3.91 10/13/2018 1629   HGB 11.9 (L) 10/13/2018 1629   HCT 35.7 (L) 10/13/2018 1629   PLT 225.0 10/13/2018 1629   MCV 91.3 10/13/2018 1629   MCH 29.0 07/02/2018 0553   MCHC 33.3 10/13/2018 1629   RDW 15.1 10/13/2018 1629   LYMPHSABS 2.3 10/13/2018 1629   MONOABS 0.8 10/13/2018 1629   EOSABS 0.0 10/13/2018 1629   BASOSABS 0.0 10/13/2018 1629    BMET    Component Value Date/Time   NA 137 10/13/2018 1629   K 3.7 10/13/2018 1629   CL 95 (L) 10/13/2018 1629   CO2 34 (H) 10/13/2018 1629   GLUCOSE 126 (H) 10/13/2018 1629   BUN 21 10/13/2018  1629   CREATININE 0.75 10/13/2018 1629   CALCIUM 9.2 10/13/2018 1629   GFRNONAA >60 07/02/2018 0553   GFRAA >60 07/02/2018 0553    BNP    Component Value Date/Time   BNP 36.1 12/09/2014 0408    ProBNP    Component Value Date/Time   PROBNP 577.0 (H) 10/13/2018 1629    Imaging: Dg Chest 2 View  Result Date: 04/28/2019 CLINICAL DATA:  COPD.  Shortness of breath. EXAM: CHEST - 2 VIEW COMPARISON:  10/13/2018 and prior radiographs FINDINGS: Cardiomegaly and LEFT-sided pacemaker again noted. Pulmonary vascular congestion is identified with small pleural effusions. No definite airspace disease, pneumothorax or dominant mass. No acute bony abnormalities are present. Cervical fusion hardware again noted. IMPRESSION: Cardiomegaly with pulmonary vascular congestion and small bilateral pleural effusions. Electronically Signed   By: Margarette Canada M.D.   On: 04/28/2019 16:59      PFT Results Latest Ref Rng & Units 12/13/2014  FVC-Pre L 1.87  FVC-Predicted Pre % 69  FVC-Post L 1.70  FVC-Predicted Post % 63  Pre FEV1/FVC % % 57  Post FEV1/FCV % % 59  FEV1-Pre L 1.06  FEV1-Predicted Pre % 53  FEV1-Post L 1.01  DLCO UNC% % 30  DLCO COR %Predicted % 77  TLC L 4.98  TLC % Predicted % 101  RV % Predicted % 141    No results found for: NITRICOXIDE      Assessment & Plan:   No problem-specific Assessment & Plan notes found for this encounter.     Rexene Edison, NP 04/28/2019

## 2019-04-28 NOTE — Patient Instructions (Addendum)
Begin Stiolto 2 puffs daily , rinse after use . Albuterol inhaler 2 puffs every 4hr as needed for wheezing .  Mucinex Twice daily  As needed  For cough/congestion  Use Flutter valve Three times a day  .  Sputum culture today  Take extra Lasix 40mg  daily for 2 days then resume 40mg  daily .  Follow up with Dr. Melvyn Novas or Odell Choung in 1 week and As needed   Please contact office for sooner follow up if symptoms do not improve or worsen or seek emergency care

## 2019-04-28 NOTE — Telephone Encounter (Signed)
Called spoke with patient  Appt scheduled for today 04/28/19 @ 1500  cxr ordered COVID screening neg, pt to wear mask, no visitors to appt  Nothing further needed at this time; will sign off.

## 2019-04-28 NOTE — Telephone Encounter (Signed)
Per 8.26.2020 phone note, have already spoken with patient and appt has been scheduled for 04/28/19 w/ TP to assess.  Nothing further needed; will sign off.

## 2019-04-29 ENCOUNTER — Telehealth: Payer: Self-pay | Admitting: Adult Health

## 2019-04-29 ENCOUNTER — Encounter: Payer: Self-pay | Admitting: General Surgery

## 2019-04-29 ENCOUNTER — Other Ambulatory Visit: Payer: Self-pay | Admitting: Adult Health

## 2019-04-29 MED ORDER — STIOLTO RESPIMAT 2.5-2.5 MCG/ACT IN AERS: 2.0000 | INHALATION_SPRAY | Freq: Every day | RESPIRATORY_TRACT | 5 refills | Status: DC

## 2019-04-29 MED ORDER — FUROSEMIDE 40 MG PO TABS: 40.0000 mg | ORAL_TABLET | Freq: Every day | ORAL | 0 refills | Status: DC

## 2019-04-29 MED ORDER — ALBUTEROL SULFATE HFA 108 (90 BASE) MCG/ACT IN AERS: 2.0000 | INHALATION_SPRAY | RESPIRATORY_TRACT | 5 refills | Status: DC | PRN

## 2019-04-29 NOTE — Addendum Note (Signed)
Addended by: Parke Poisson E on: 04/29/2019 10:21 AM   Modules accepted: Orders

## 2019-04-29 NOTE — Telephone Encounter (Signed)
Patient's son states needs the directions for the Furodemide med.  States patient was seen on 04/28/2019.  Nicole Kindred phone number is [phone]. Would like to know what kind of sample inhaler she received at office visit.

## 2019-04-29 NOTE — Assessment & Plan Note (Signed)
Cont on O2 .  

## 2019-04-29 NOTE — Telephone Encounter (Signed)
Per TP: okay to refill patient Furosemide 40mg  qd #30 with no refills.  Future refills need to come from the PCP.  Thank you.

## 2019-04-29 NOTE — Telephone Encounter (Signed)
Spoke with the pt's daughter Otila Kluver  I advised of recs per TP  She verbalized understanding  I see that there was a question about inhaler below per pt's son  Per Otila Kluver, there was a second inhaler on med list that they were not sure about  I think she is referring to her albuterol inhaler  She is unsure if the pt still has one of these  She will call next wk if pt needs this refilled  Nothing further needed at this time

## 2019-04-29 NOTE — Assessment & Plan Note (Signed)
Suspected decompensated -bilateral effusions -gentle diuresis  Check labs on return   Plan  Patient Instructions  Begin Stiolto 2 puffs daily , rinse after use . Albuterol inhaler 2 puffs every 4hr as needed for wheezing .  Mucinex Twice daily  As needed  For cough/congestion  Use Flutter valve Three times a day  .  Sputum culture today  Take extra Lasix 40mg  daily for 2 days then resume 40mg  daily .  Follow up with Dr. Melvyn Novas or Kaavya Puskarich in 1 week and As needed   Please contact office for sooner follow up if symptoms do not improve or worsen or seek emergency care

## 2019-04-29 NOTE — Telephone Encounter (Signed)
Spoke with the pt's daughter  She is requesting rx for lasix  She states they did not realize that the pt had ran out of med and now they can not get this filled bc PCP office is closed  She is upset bc she has been waiting all day to get this medication  Wants enough to be able to take the way TP prescribed at ov yesterday and until she can get her PCP to take back over  Please advise on rx thanks

## 2019-04-29 NOTE — Assessment & Plan Note (Signed)
Recurrent exacerbation  Hold on additional abx at this time, check sputum cx  Gentle diuresis  Check labs on return, unable to check today as lab is closed.  Add flutter valve  Begin LAMA/LABA -Stiolto   Plan  Patient Instructions  Begin Stiolto 2 puffs daily , rinse after use . Albuterol inhaler 2 puffs every 4hr as needed for wheezing .  Mucinex Twice daily  As needed  For cough/congestion  Use Flutter valve Three times a day  .  Sputum culture today  Take extra Lasix 40mg  daily for 2 days then resume 40mg  daily .  Follow up with Dr. Melvyn Novas or [place] in 1 week and As needed   Please contact office for sooner follow up if symptoms do not improve or worsen or seek emergency care

## 2019-05-05 ENCOUNTER — Other Ambulatory Visit (INDEPENDENT_AMBULATORY_CARE_PROVIDER_SITE_OTHER): Payer: Medicare Other

## 2019-05-05 ENCOUNTER — Encounter: Payer: Self-pay | Admitting: Adult Health

## 2019-05-05 ENCOUNTER — Telehealth: Payer: Self-pay | Admitting: Adult Health

## 2019-05-05 ENCOUNTER — Other Ambulatory Visit: Payer: Self-pay

## 2019-05-05 ENCOUNTER — Ambulatory Visit (INDEPENDENT_AMBULATORY_CARE_PROVIDER_SITE_OTHER): Payer: Medicare Other | Admitting: Adult Health

## 2019-05-05 VITALS — BP 126/72 | HR 84 | Temp 97.6°F | Ht 64.0 in | Wt 168.6 lb

## 2019-05-05 DIAGNOSIS — R5381 Other malaise: Secondary | ICD-10-CM | POA: Diagnosis not present

## 2019-05-05 DIAGNOSIS — J9611 Chronic respiratory failure with hypoxia: Secondary | ICD-10-CM

## 2019-05-05 DIAGNOSIS — I5032 Chronic diastolic (congestive) heart failure: Secondary | ICD-10-CM | POA: Diagnosis not present

## 2019-05-05 DIAGNOSIS — J449 Chronic obstructive pulmonary disease, unspecified: Secondary | ICD-10-CM | POA: Diagnosis not present

## 2019-05-05 LAB — BASIC METABOLIC PANEL
BUN: 16 mg/dL (ref 6–23)
CO2: 34 mEq/L — ABNORMAL HIGH (ref 19–32)
Calcium: 8.9 mg/dL (ref 8.4–10.5)
Chloride: 94 mEq/L — ABNORMAL LOW (ref 96–112)
Creatinine, Ser: 0.61 mg/dL (ref 0.40–1.20)
GFR: 94.44 mL/min (ref >60.00)
Glucose, Bld: 117 mg/dL — ABNORMAL HIGH (ref 70–99)
Potassium: 4.3 mEq/L (ref 3.5–5.1)
Sodium: 134 mEq/L — ABNORMAL LOW (ref 135–145)

## 2019-05-05 LAB — BRAIN NATRIURETIC PEPTIDE: Pro B Natriuretic peptide (BNP): 1161 pg/mL — ABNORMAL HIGH (ref 0.0–100.0)

## 2019-05-05 MED ORDER — PREDNISONE 10 MG PO TABS: 20.0000 mg | ORAL_TABLET | Freq: Every day | ORAL | 0 refills | Status: DC

## 2019-05-05 MED ORDER — FUROSEMIDE 40 MG PO TABS: 40.0000 mg | ORAL_TABLET | Freq: Every day | ORAL | 3 refills | Status: DC

## 2019-05-05 MED ORDER — AMOXICILLIN-POT CLAVULANATE 875-125 MG PO TABS: 1.0000 | ORAL_TABLET | Freq: Two times a day (BID) | ORAL | 0 refills | Status: AC

## 2019-05-05 MED ORDER — STIOLTO RESPIMAT 2.5-2.5 MCG/ACT IN AERS: 2.0000 | INHALATION_SPRAY | Freq: Every day | RESPIRATORY_TRACT | 5 refills | Status: DC

## 2019-05-05 MED ORDER — FUROSEMIDE 40 MG PO TABS: 40.0000 mg | ORAL_TABLET | Freq: Every day | ORAL | 0 refills | Status: DC

## 2019-05-05 NOTE — Telephone Encounter (Signed)
[name], Furosemide needs to come from Pulmonology Thanks! Valetta Fuller

## 2019-05-05 NOTE — Assessment & Plan Note (Signed)
Cont on o2 .  

## 2019-05-05 NOTE — Assessment & Plan Note (Signed)
Slow to resolve COPD flare with decompensated D CHF  Sputum culture is pending .  Gentle diuresis , check bnp  Check cxr on return   Empiric abx with augmentin . Short steroid burst  Increased pulmonary hygiene with glutter.  Order PT for severe deconditioning   Plan  Patient Instructions  Augmentin 875mg  Twice daily  For 1 week .  Prednisone 20mg  daily for 3 days .  Ensure daily .  Continue on  Stiolto 2 puffs daily , rinse after use . Albuterol inhaler 2 puffs every 4hr as needed for wheezing .  Mucinex Twice daily  As needed  For cough/congestion  Use Flutter valve Three times a day  Take extra Lasix 40mg  daily for 2 days then resume 40mg  daily .  Labs today .  PT order .  Follow up with Dr. Melvyn Novas or Morry Veiga in 2  Weeks with chest xray and As needed   Please contact office for sooner follow up if symptoms do not improve or worsen or seek emergency care

## 2019-05-05 NOTE — Telephone Encounter (Signed)
Pt's daughter [name] called states due to mom's frail cognitive condition pls call her @ [phone] )   --- Otila Kluver says pt got emergency refill on [facility] frm Dr. Owens Shark because she ran out over weekend back on 8/28 but doesn't want to run out again.  -- Valetta Fuller has Never prescribed this Rx bu because she is the new PCPthey want her to manage Rx.   --Forwarding request to CMA :   furosemide (LASIX) 40 MG tablet [id number]   Order Details Dose: 40 mg Route: Oral Frequency: Daily  Dispense Quantity: 30 tablet Refills: 0 Fills remaining: --        Sig: Take 1 tablet (40 mg total) by mouth daily. As directed     -- Forwarding request to medical asst that if approved send to :   [id number] [address], [place] [phone] (Phone) [phone] (Fax)   --glh

## 2019-05-05 NOTE — Progress Notes (Signed)
@Patient  ID: [name], female    DOB: 11-29-1940, 78 y.o.   MRN: [id number]  Chief Complaint  Patient presents with  . Follow-up    COPD     Referring provider: Esaw Grandchild, NP  HPI: 78 year old female former smoker followed for moderate COPD , GOLD II COPD and Oxygen dependent RF on Oxygen 2l/m  Medical hx significant , chronic back pain, DJD/DDD, HTN, CHF , Pacemaker.   TEST/EVENTS :   12/13/2014-pulmonary function test-FVC 1.87 (69% predicted), postbronchodilator ratio 59, postbronchodilator FEV1 1.01 (50% predicted), no bronchodilator response, DLCO 30  CT chest October 2019 showed mild atelectasis in the left lung base.  Otherwise clear.  Displaced right eighth and ninth rib fracture  2D echo April 2016 EF 50 to 55%  05/05/19 Follow up : COPD  Patient returns for a one-week follow-up.  Patient is prone to recurrent COPD exacerbation.  Over the last few months she has had recurrent episodes of COPD flares.  She was treated with Z-Pak, doxycycline and 2 prednisone tapers.  States that her symptoms did improve briefly and then once she got off of prednisone symptoms seem to return. Chest x-ray last visit showed cardiomegaly with pulmonary vascular congestion and small bilateral effusions.  COVID-19 testing earlier last month was negative.  Last visit she was started on Stiolto.  Recommend to take Lasix 40 mg extra to total 80 mg mg daily for 2 days and then resume Lasix 40 mg daily dosing. Has been declining since November 2019 . Weight slowly trending down , low appetite.  Activity tolerance is low . No orthopnea or increased edmea. Has not seen much improvement since last visit . Cough is more congested with thick mucus . No fever or chest pain .    No Known Allergies  Immunization History  Administered Date(s) Administered  . Influenza Split 06/01/2014, 06/02/2015  . Influenza, High Dose Seasonal PF 06/04/2018  . Pneumococcal Polysaccharide-23 06/01/2006    Past Medical History:  Diagnosis Date  . Arthritis    "hands, spine" (12/07/2014)  . CHB (complete heart block) ([place]) 09/19/2013  . CHF (congestive heart failure) ([place]) dx'd 12/06/2014  . Chronic pain    "since neck OR in 2007"  . Coronary artery disease    normal coronaries by 09/10/00 cath  . Diabetes mellitus    hga1c 7.1 no meds  . Dysrhythmia   . HTN (hypertension) 09/19/2013  . Hypercholesterolemia   . Hyperlipidemia 09/19/2013  . Hypertension   . Myocardial infarction ([place]) dx'd 2009  . Neuropathy of both feet   . Pacemaker   . Paralysis of right hand ([place]) 2007   "after spinal cord injury/OR"  . Shortness of breath    exertional  . Type II diabetes mellitus ([place])    "had it years ago; lost weight after neck OR in 2007; glucose went way down; recently dx'd as returned recently" (12/07/2014)  . Urinary incontinence    "since 2007's OR"  . Venous insufficiency (chronic) (peripheral)     Tobacco History: Social History   Tobacco Use  Smoking Status Former Smoker  . Packs/day: 0.50  . Years: 32.00  . Pack years: 16.00  . Types: Cigarettes  . Start date: 1963  . Quit date: 03/02/1994  . Years since quitting: 25.1  Smokeless Tobacco Never Used   Counseling given: Not Answered   Outpatient Medications Prior to Visit  Medication Sig Dispense Refill  . acetaminophen (TYLENOL) 500 MG tablet Take 500 mg by mouth  4 (four) times daily -  with meals and at bedtime.    Marland Kitchen albuterol (VENTOLIN HFA) 108 (90 Base) MCG/ACT inhaler Inhale 2 puffs into the lungs every 4 (four) hours as needed for wheezing or shortness of breath. 8 g 5  . aspirin EC 81 MG tablet Take 81 mg by mouth daily.    . DULoxetine (CYMBALTA) 20 MG capsule Take 1 capsule (20 mg total) by mouth daily. 90 capsule 1  . famotidine (PEPCID) 20 MG tablet Take 20 mg by mouth at bedtime.    . furosemide (LASIX) 40 MG tablet Take 1 tablet (40 mg total) by mouth daily. As directed 30 tablet 0  . ibuprofen (ADVIL,MOTRIN) 200  MG tablet Take 200 mg by mouth every 6 (six) hours as needed.    Marland Kitchen KLOR-CON M20 20 MEQ tablet TAKE 1 TABLET (20 MEQ TOTAL) BY MOUTH DAILY. 30 tablet 6  . losartan (COZAAR) 50 MG tablet Take 1 tablet (50 mg total) by mouth daily. 90 tablet 3  . meloxicam (MOBIC) 15 MG tablet Take 15 mg by mouth daily.    . metFORMIN (GLUCOPHAGE) 500 MG tablet 1/2 tablet nightly with dinner 90 tablet 1  . omeprazole (PRILOSEC) 20 MG capsule Take 20 mg by mouth every morning.    . OXYGEN 2lpm with sleep and exertion [employer]    . pravastatin (PRAVACHOL) 40 MG tablet Take 40 mg by mouth daily.    . Tiotropium Bromide-Olodaterol (STIOLTO RESPIMAT) 2.5-2.5 MCG/ACT AERS Inhale 2 puffs into the lungs daily. 4 g 5  . tiZANidine (ZANAFLEX) 2 MG tablet Take 2 mg by mouth every 6 (six) hours as needed for muscle spasms.    . Tiotropium Bromide-Olodaterol (STIOLTO RESPIMAT) 2.5-2.5 MCG/ACT AERS Inhale 2 puffs into the lungs daily. 1 g 0   No facility-administered medications prior to visit.      Review of Systems:   Constitutional:   No    night sweats,  Fevers, chills, + fatigue, or  lassitude.  HEENT:   No headaches,  Difficulty swallowing,  Tooth/dental problems, or  Sore throat,                No sneezing, itching, ear ache, nasal congestion, post nasal drip,   CV:  No chest pain,  Orthopnea, PND, swelling in lower extremities, anasarca, dizziness, palpitations, syncope.   GI  No heartburn, indigestion, abdominal pain, nausea, vomiting, diarrhea, change in bowel habits, loss of appetite, bloody stools.   Resp:  .  No chest wall deformity  Skin: no rash or lesions.  GU: no dysuria, change in color of urine, no urgency or frequency.  No flank pain, no hematuria   MS:  No joint pain or swelling.  No decreased range of motion.  No back pain.    Physical Exam  BP 126/72 (BP Location: Right Arm, Cuff Size: Normal)   Pulse 84   Temp 97.6 F (36.4 C) (Temporal)   Ht 5\' 4"  (1.626 m)   Wt 168 lb 9.6 oz  (76.5 kg)   SpO2 96%   BMI 28.94 kg/m   GEN: A/Ox3; pleasant , NAD, elderly ,frail ,on O2    HEENT:  [place]/AT,  EACs-clear, TMs-wnl, NOSE-clear, THROAT-clear, no lesions, no postnasal drip or exudate noted.   NECK:  Supple w/ fair ROM; no JVD; normal carotid impulses w/o bruits; no thyromegaly or nodules palpated; no lymphadenopathy.    RESP  Scattered rhonchi   no accessory muscle use, no dullness to percussion  CARD:  RRR, no m/r/g, no peripheral edema, pulses intact, no cyanosis or clubbing.  GI:   Soft & nt; nml bowel sounds; no organomegaly or masses detected.   Musco: Warm bil, no deformities or joint swelling noted.   Neuro: alert, no focal deficits noted.    Skin: Warm, no lesions or rashes    Lab Results:  CBC  BNP  Imaging: Dg Chest 2 View  Result Date: 04/28/2019 CLINICAL DATA:  COPD.  Shortness of breath. EXAM: CHEST - 2 VIEW COMPARISON:  10/13/2018 and prior radiographs FINDINGS: Cardiomegaly and LEFT-sided pacemaker again noted. Pulmonary vascular congestion is identified with small pleural effusions. No definite airspace disease, pneumothorax or dominant mass. No acute bony abnormalities are present. Cervical fusion hardware again noted. IMPRESSION: Cardiomegaly with pulmonary vascular congestion and small bilateral pleural effusions. Electronically Signed   By: Margarette Canada M.D.   On: 04/28/2019 16:59      PFT Results Latest Ref Rng & Units 12/13/2014  FVC-Pre L 1.87  FVC-Predicted Pre % 69  FVC-Post L 1.70  FVC-Predicted Post % 63  Pre FEV1/FVC % % 57  Post FEV1/FCV % % 59  FEV1-Pre L 1.06  FEV1-Predicted Pre % 53  FEV1-Post L 1.01  DLCO UNC% % 30  DLCO COR %Predicted % 77  TLC L 4.98  TLC % Predicted % 101  RV % Predicted % 141    No results found for: NITRICOXIDE      Assessment & Plan:   COPD GOLD II Slow to resolve COPD flare with decompensated D CHF  Sputum culture is pending .  Gentle diuresis , check bnp  Check cxr on return    Empiric abx with augmentin . Short steroid burst  Increased pulmonary hygiene with glutter.  Order PT for severe deconditioning   Plan  Patient Instructions  Augmentin 875mg  Twice daily  For 1 week .  Prednisone 20mg  daily for 3 days .  Ensure daily .  Continue on  Stiolto 2 puffs daily , rinse after use . Albuterol inhaler 2 puffs every 4hr as needed for wheezing .  Mucinex Twice daily  As needed  For cough/congestion  Use Flutter valve Three times a day  Take extra Lasix 40mg  daily for 2 days then resume 40mg  daily .  Labs today .  PT order .  Follow up with Dr. Melvyn Novas or Bricelyn Freestone in 2  Weeks with chest xray and As needed   Please contact office for sooner follow up if symptoms do not improve or worsen or seek emergency care        Chronic respiratory failure with hypoxia ([place]) Cont on o2 .   Chronic diastolic heart failure ([facility]) Suspected on Decompensated CHF -gentle diuresis . Check BNP .   Plan  Patient Instructions  Augmentin 875mg  Twice daily  For 1 week .  Prednisone 20mg  daily for 3 days .  Ensure daily .  Continue on  Stiolto 2 puffs daily , rinse after use . Albuterol inhaler 2 puffs every 4hr as needed for wheezing .  Mucinex Twice daily  As needed  For cough/congestion  Use Flutter valve Three times a day  Take extra Lasix 40mg  daily for 2 days then resume 40mg  daily .  Labs today .  PT order .  Follow up with Dr. Melvyn Novas or Drea Jurewicz in 2  Weeks with chest xray and As needed   Please contact office for sooner follow up if symptoms do not improve or worsen or seek emergency care  n   Physical deconditioning Order home PT .       Rexene Edison, NP 05/05/2019

## 2019-05-05 NOTE — Telephone Encounter (Signed)
Please refill her furosemide, 90 day supply, 1 year RF

## 2019-05-05 NOTE — Patient Instructions (Addendum)
Augmentin 875mg  Twice daily  For 1 week .  Prednisone 20mg  daily for 3 days .  Ensure daily .  Continue on  Stiolto 2 puffs daily , rinse after use . Albuterol inhaler 2 puffs every 4hr as needed for wheezing .  Mucinex Twice daily  As needed  For cough/congestion  Use Flutter valve Three times a day  Take extra Lasix 40mg  daily for 2 days then resume 40mg  daily .  Labs today .  PT order .  Follow up with Dr. Melvyn Novas or Parrett in 2  Weeks with chest xray and As needed   Please contact office for sooner follow up if symptoms do not improve or worsen or seek emergency care

## 2019-05-05 NOTE — Telephone Encounter (Signed)
The furosemide 40 mg once daily has been refilled (90 day supply for one year)

## 2019-05-05 NOTE — Assessment & Plan Note (Signed)
Suspected on Decompensated CHF -gentle diuresis . Check BNP .   Plan  Patient Instructions  Augmentin 875mg  Twice daily  For 1 week .  Prednisone 20mg  daily for 3 days .  Ensure daily .  Continue on  Stiolto 2 puffs daily , rinse after use . Albuterol inhaler 2 puffs every 4hr as needed for wheezing .  Mucinex Twice daily  As needed  For cough/congestion  Use Flutter valve Three times a day  Take extra Lasix 40mg  daily for 2 days then resume 40mg  daily .  Labs today .  PT order .  Follow up with Dr. Melvyn Novas or Diondre Pulis in 2  Weeks with chest xray and As needed   Please contact office for sooner follow up if symptoms do not improve or worsen or seek emergency care     n

## 2019-05-05 NOTE — Assessment & Plan Note (Signed)
Order home PT .  

## 2019-05-05 NOTE — Telephone Encounter (Signed)
Correction.  RX needs to come from cardiology since they are managing CHF, per Mina Marble, NP.  Charyl Bigger, CMA

## 2019-05-05 NOTE — Telephone Encounter (Signed)
We have not prescribed these medications for the patient previously.  Please review and refill if appropriate.  T. Nelson, CMA  

## 2019-05-06 ENCOUNTER — Telehealth: Payer: Self-pay | Admitting: Adult Health

## 2019-05-06 ENCOUNTER — Other Ambulatory Visit: Payer: Self-pay | Admitting: Adult Health

## 2019-05-06 DIAGNOSIS — D649 Anemia, unspecified: Secondary | ICD-10-CM

## 2019-05-06 DIAGNOSIS — I5032 Chronic diastolic (congestive) heart failure: Secondary | ICD-10-CM

## 2019-05-06 DIAGNOSIS — R0609 Other forms of dyspnea: Secondary | ICD-10-CM

## 2019-05-06 LAB — CBC WITH DIFFERENTIAL/PLATELET
Basophils Absolute: 0 10*3/uL (ref 0.0–0.1)
Basophils Relative: 0.4 % (ref 0.0–3.0)
Eosinophils Absolute: 0 10*3/uL (ref 0.0–0.7)
Eosinophils Relative: 0.7 % (ref 0.0–5.0)
HCT: 32.2 % — ABNORMAL LOW (ref 36.0–46.0)
Hemoglobin: 10.4 g/dL — ABNORMAL LOW (ref 12.0–15.0)
Lymphocytes Relative: 19.6 % (ref 12.0–46.0)
Lymphs Abs: 1.2 10*3/uL (ref 0.7–4.0)
MCHC: 32.3 g/dL (ref 30.0–36.0)
MCV: 90.1 fl (ref 78.0–100.0)
Monocytes Absolute: 0.5 10*3/uL (ref 0.1–1.0)
Monocytes Relative: 8 % (ref 3.0–12.0)
Neutro Abs: 4.4 10*3/uL (ref 1.4–7.7)
Neutrophils Relative %: 71.3 % (ref 43.0–77.0)
Platelets: 214 10*3/uL (ref 150.0–400.0)
RBC: 3.57 Mil/uL — ABNORMAL LOW (ref 3.87–5.11)
RDW: 15.1 % (ref 11.5–15.5)
WBC: 6.2 10*3/uL (ref 4.0–10.5)

## 2019-05-06 MED ORDER — PREDNISONE 20 MG PO TABS: 20.0000 mg | ORAL_TABLET | Freq: Every day | ORAL | 0 refills | Status: AC

## 2019-05-06 MED ORDER — STIOLTO RESPIMAT 2.5-2.5 MCG/ACT IN AERS: 2.0000 | INHALATION_SPRAY | Freq: Every day | RESPIRATORY_TRACT | 0 refills | Status: DC

## 2019-05-06 NOTE — Progress Notes (Signed)
Called spoke with patient and daughter Otila Kluver (dpr on file), advised of lab results / recs as stated by TP.  Pt and Otila Kluver verbalized understanding and denied any questions.  Orders only encounter created for the appts with PCP, Cardiology and 2D echo.  Note in orders to call Otila Kluver to schedule.

## 2019-05-06 NOTE — Telephone Encounter (Signed)
Call returned to patient daughter [name] (dpr), she reports her mother's insurance will not cover the [employer]. She said it costs $500. I made her aware we can give her an additional sample but they will need to call her insurance and find out what inhalers are on her formulary. Voiced understanding. Samples placed upfront for pick up. Nothing further needed at this time.

## 2019-05-06 NOTE — Addendum Note (Signed)
Addended by: Parke Poisson E on: 05/06/2019 01:42 PM   Modules accepted: Orders

## 2019-05-07 DIAGNOSIS — E1142 Type 2 diabetes mellitus with diabetic polyneuropathy: Secondary | ICD-10-CM | POA: Diagnosis not present

## 2019-05-07 DIAGNOSIS — E7800 Pure hypercholesterolemia, unspecified: Secondary | ICD-10-CM | POA: Diagnosis not present

## 2019-05-07 DIAGNOSIS — J9611 Chronic respiratory failure with hypoxia: Secondary | ICD-10-CM | POA: Diagnosis not present

## 2019-05-07 DIAGNOSIS — I2510 Atherosclerotic heart disease of native coronary artery without angina pectoris: Secondary | ICD-10-CM | POA: Diagnosis not present

## 2019-05-07 DIAGNOSIS — J441 Chronic obstructive pulmonary disease with (acute) exacerbation: Secondary | ICD-10-CM | POA: Diagnosis not present

## 2019-05-07 DIAGNOSIS — I872 Venous insufficiency (chronic) (peripheral): Secondary | ICD-10-CM | POA: Diagnosis not present

## 2019-05-07 DIAGNOSIS — I442 Atrioventricular block, complete: Secondary | ICD-10-CM | POA: Diagnosis not present

## 2019-05-07 DIAGNOSIS — M47819 Spondylosis without myelopathy or radiculopathy, site unspecified: Secondary | ICD-10-CM | POA: Diagnosis not present

## 2019-05-07 DIAGNOSIS — Z9181 History of falling: Secondary | ICD-10-CM | POA: Diagnosis not present

## 2019-05-07 DIAGNOSIS — G8929 Other chronic pain: Secondary | ICD-10-CM | POA: Diagnosis not present

## 2019-05-07 DIAGNOSIS — M19042 Primary osteoarthritis, left hand: Secondary | ICD-10-CM | POA: Diagnosis not present

## 2019-05-07 DIAGNOSIS — I5033 Acute on chronic diastolic (congestive) heart failure: Secondary | ICD-10-CM | POA: Diagnosis not present

## 2019-05-07 DIAGNOSIS — M19041 Primary osteoarthritis, right hand: Secondary | ICD-10-CM | POA: Diagnosis not present

## 2019-05-07 DIAGNOSIS — Z7984 Long term (current) use of oral hypoglycemic drugs: Secondary | ICD-10-CM | POA: Diagnosis not present

## 2019-05-07 DIAGNOSIS — Z950 Presence of cardiac pacemaker: Secondary | ICD-10-CM | POA: Diagnosis not present

## 2019-05-07 DIAGNOSIS — Z87891 Personal history of nicotine dependence: Secondary | ICD-10-CM | POA: Diagnosis not present

## 2019-05-07 DIAGNOSIS — I110 Hypertensive heart disease with heart failure: Secondary | ICD-10-CM | POA: Diagnosis not present

## 2019-05-07 DIAGNOSIS — Z9981 Dependence on supplemental oxygen: Secondary | ICD-10-CM | POA: Diagnosis not present

## 2019-05-08 LAB — RESPIRATORY CULTURE OR RESPIRATORY AND SPUTUM CULTURE
MICRO NUMBER:: 847122
RESULT:: NORMAL
SPECIMEN QUALITY:: ADEQUATE

## 2019-05-10 DIAGNOSIS — I2510 Atherosclerotic heart disease of native coronary artery without angina pectoris: Secondary | ICD-10-CM | POA: Diagnosis not present

## 2019-05-10 DIAGNOSIS — J9611 Chronic respiratory failure with hypoxia: Secondary | ICD-10-CM | POA: Diagnosis not present

## 2019-05-10 DIAGNOSIS — I442 Atrioventricular block, complete: Secondary | ICD-10-CM | POA: Diagnosis not present

## 2019-05-10 DIAGNOSIS — I5033 Acute on chronic diastolic (congestive) heart failure: Secondary | ICD-10-CM | POA: Diagnosis not present

## 2019-05-10 DIAGNOSIS — J441 Chronic obstructive pulmonary disease with (acute) exacerbation: Secondary | ICD-10-CM | POA: Diagnosis not present

## 2019-05-10 DIAGNOSIS — I110 Hypertensive heart disease with heart failure: Secondary | ICD-10-CM | POA: Diagnosis not present

## 2019-05-12 DIAGNOSIS — I442 Atrioventricular block, complete: Secondary | ICD-10-CM | POA: Diagnosis not present

## 2019-05-12 DIAGNOSIS — I2510 Atherosclerotic heart disease of native coronary artery without angina pectoris: Secondary | ICD-10-CM | POA: Diagnosis not present

## 2019-05-12 DIAGNOSIS — I5033 Acute on chronic diastolic (congestive) heart failure: Secondary | ICD-10-CM | POA: Diagnosis not present

## 2019-05-12 DIAGNOSIS — J9611 Chronic respiratory failure with hypoxia: Secondary | ICD-10-CM | POA: Diagnosis not present

## 2019-05-12 DIAGNOSIS — I110 Hypertensive heart disease with heart failure: Secondary | ICD-10-CM | POA: Diagnosis not present

## 2019-05-12 DIAGNOSIS — J441 Chronic obstructive pulmonary disease with (acute) exacerbation: Secondary | ICD-10-CM | POA: Diagnosis not present

## 2019-05-13 ENCOUNTER — Ambulatory Visit (INDEPENDENT_AMBULATORY_CARE_PROVIDER_SITE_OTHER): Payer: Medicare Other | Admitting: Adult Health

## 2019-05-13 ENCOUNTER — Encounter: Payer: Self-pay | Admitting: Adult Health

## 2019-05-13 ENCOUNTER — Ambulatory Visit (INDEPENDENT_AMBULATORY_CARE_PROVIDER_SITE_OTHER): Payer: Medicare Other

## 2019-05-13 ENCOUNTER — Other Ambulatory Visit: Payer: Self-pay

## 2019-05-13 ENCOUNTER — Ambulatory Visit (HOSPITAL_COMMUNITY): Payer: Medicare Other | Attending: Cardiology

## 2019-05-13 VITALS — BP 126/64 | HR 80 | Temp 98.3°F | Ht 65.0 in | Wt 163.0 lb

## 2019-05-13 DIAGNOSIS — I509 Heart failure, unspecified: Secondary | ICD-10-CM | POA: Diagnosis not present

## 2019-05-13 DIAGNOSIS — R5381 Other malaise: Secondary | ICD-10-CM | POA: Diagnosis not present

## 2019-05-13 DIAGNOSIS — R0609 Other forms of dyspnea: Secondary | ICD-10-CM | POA: Insufficient documentation

## 2019-05-13 DIAGNOSIS — I5032 Chronic diastolic (congestive) heart failure: Secondary | ICD-10-CM | POA: Diagnosis not present

## 2019-05-13 DIAGNOSIS — J449 Chronic obstructive pulmonary disease, unspecified: Secondary | ICD-10-CM | POA: Diagnosis not present

## 2019-05-13 DIAGNOSIS — J9611 Chronic respiratory failure with hypoxia: Secondary | ICD-10-CM | POA: Diagnosis not present

## 2019-05-13 DIAGNOSIS — J439 Emphysema, unspecified: Secondary | ICD-10-CM | POA: Diagnosis not present

## 2019-05-13 MED ORDER — FLUTTER DEVI: 0 refills | Status: AC

## 2019-05-13 MED ORDER — STIOLTO RESPIMAT 2.5-2.5 MCG/ACT IN AERS: 2.0000 | INHALATION_SPRAY | Freq: Every day | RESPIRATORY_TRACT | 0 refills | Status: AC

## 2019-05-13 NOTE — Progress Notes (Signed)
@Patient  ID: [name], female    DOB: 06-Sep-1940, 78 y.o.   MRN: [id number]  Chief Complaint  Patient presents with  . Follow-up    Referring provider: Esaw Grandchild, NP  HPI: 78 year old female former smoker followed for moderate COPD, oxygen dependent respiratory failure Medical history significant for chronic back pain, DJD, hypertension, CHF, pacemaker   TEST/EVENTS :  12/13/2014-pulmonary function test-FVC 1.87 (69% predicted), postbronchodilator ratio 59, postbronchodilator FEV1 1.01 (50% predicted), no bronchodilator response, DLCO 30  CT chest October 2019 showed mild atelectasis in the left lung base. Otherwise clear. Displaced right eighth and ninth rib fracture  2D echo April 2016 EF 50 to 55%  05/13/2019 Follow up : COPD , O2 RF , Dyspnea  Patient presents for a one-week follow-up.  Patient is prone to recurrent COPD exacerbations.  Has had several episodes of cough shortness of breath over the last several months.  She has been treated with a Z-Pak, doxycycline and 2 prednisone tapers.  Chest x-ray showed cardiomegaly with pulmonary vascular congestion and small bilateral pleural effusions.  COVID-19 testing was negative last month.  Lab work showed an elevated BNP at 1161.  Patient was recommended to increase Lasix to 40 mg daily.  She returns today with slight improvement with decreased shortness of breath.  Decreased lower extremity edema with decrease in weight at 5 pounds.  Physical therapy was also ordered.  And is working with her at home.  They recommend that she have home occupational therapy and home health nurse evaluation.  She has been referred to cardiology.  And a 2D echo is pending.  She denies any fever chest pain or orthopnea.   No Known Allergies  Immunization History  Administered Date(s) Administered  . Influenza Split 06/01/2014, 06/02/2015  . Influenza, High Dose Seasonal PF 06/04/2018  . Pneumococcal Polysaccharide-23 06/01/2006     Past Medical History:  Diagnosis Date  . Arthritis    "hands, spine" (12/07/2014)  . CHB (complete heart block) ([place]) 09/19/2013  . CHF (congestive heart failure) ([place]) dx'd 12/06/2014  . Chronic pain    "since neck OR in 2007"  . Coronary artery disease    normal coronaries by 09/10/00 cath  . Diabetes mellitus    hga1c 7.1 no meds  . Dysrhythmia   . HTN (hypertension) 09/19/2013  . Hypercholesterolemia   . Hyperlipidemia 09/19/2013  . Hypertension   . Myocardial infarction ([place]) dx'd 2009  . Neuropathy of both feet   . Pacemaker   . Paralysis of right hand ([place]) 2007   "after spinal cord injury/OR"  . Shortness of breath    exertional  . Type II diabetes mellitus ([place])    "had it years ago; lost weight after neck OR in 2007; glucose went way down; recently dx'd as returned recently" (12/07/2014)  . Urinary incontinence    "since 2007's OR"  . Venous insufficiency (chronic) (peripheral)     Tobacco History: Social History   Tobacco Use  Smoking Status Former Smoker  . Packs/day: 0.50  . Years: 32.00  . Pack years: 16.00  . Types: Cigarettes  . Start date: 1963  . Quit date: 03/02/1994  . Years since quitting: 25.2  Smokeless Tobacco Never Used   Counseling given: Not Answered   Outpatient Medications Prior to Visit  Medication Sig Dispense Refill  . acetaminophen (TYLENOL) 500 MG tablet Take 500 mg by mouth 4 (four) times daily -  with meals and at bedtime.    Marland Kitchen albuterol (  VENTOLIN HFA) 108 (90 Base) MCG/ACT inhaler Inhale 2 puffs into the lungs every 4 (four) hours as needed for wheezing or shortness of breath. 8 g 5  . aspirin EC 81 MG tablet Take 81 mg by mouth daily.    . DULoxetine (CYMBALTA) 20 MG capsule Take 1 capsule (20 mg total) by mouth daily. 90 capsule 1  . famotidine (PEPCID) 20 MG tablet Take 20 mg by mouth at bedtime.    . furosemide (LASIX) 40 MG tablet Take 1 tablet (40 mg total) by mouth daily. As directed 90 tablet 3  . ibuprofen (ADVIL,MOTRIN) 200  MG tablet Take 200 mg by mouth every 6 (six) hours as needed.    Marland Kitchen KLOR-CON M20 20 MEQ tablet TAKE 1 TABLET (20 MEQ TOTAL) BY MOUTH DAILY. 30 tablet 6  . losartan (COZAAR) 50 MG tablet Take 1 tablet (50 mg total) by mouth daily. 90 tablet 3  . meloxicam (MOBIC) 15 MG tablet Take 15 mg by mouth daily.    . metFORMIN (GLUCOPHAGE) 500 MG tablet 1/2 tablet nightly with dinner 90 tablet 1  . omeprazole (PRILOSEC) 20 MG capsule Take 20 mg by mouth every morning.    . OXYGEN 2lpm with sleep and exertion [employer]    . pravastatin (PRAVACHOL) 40 MG tablet Take 40 mg by mouth daily.    . Tiotropium Bromide-Olodaterol (STIOLTO RESPIMAT) 2.5-2.5 MCG/ACT AERS Inhale 2 puffs into the lungs daily. 2 g 0  . tiZANidine (ZANAFLEX) 2 MG tablet Take 2 mg by mouth every 6 (six) hours as needed for muscle spasms.     No facility-administered medications prior to visit.      Review of Systems:   Constitutional:   No  weight loss, night sweats,  Fevers, chills, + fatigue, or  lassitude.  HEENT:   No headaches,  Difficulty swallowing,  Tooth/dental problems, or  Sore throat,                No sneezing, itching, ear ache, nasal congestion, post nasal drip,   CV:  No chest pain,  Orthopnea, PND,, anasarca, dizziness, palpitations, syncope.   GI  No heartburn, indigestion, abdominal pain, nausea, vomiting, diarrhea, change in bowel habits, loss of appetite, bloody stools.   Resp:    No chest wall deformity  Skin: no rash or lesions.  GU: no dysuria, change in color of urine, no urgency or frequency.  No flank pain, no hematuria   MS:  No joint pain or swelling.  No decreased range of motion.  No back pain.    Physical Exam    GEN: A/Ox3; pleasant , frail elderly in wheelchair   HEENT:  [place]/AT,   NOSE-clear, THROAT-clear, no lesions, no postnasal drip or exudate noted.   NECK:  Supple w/ fair ROM; no JVD; normal carotid impulses w/o bruits; no thyromegaly or nodules palpated; no lymphadenopathy.     RESP decreased breath on the basis  no accessory muscle use, no dullness to percussion  CARD:  RRR, no m/r/g, tr  peripheral edema, pulses intact, no cyanosis or clubbing.  GI:   Soft & nt; nml bowel sounds; no organomegaly or masses detected.   Musco: Warm bil, no deformities or joint swelling noted.   Neuro: alert, no focal deficits noted.    Skin: Warm, no lesions or rashes    Lab Results:  CBC    Component Value Date/Time   WBC 6.2 05/05/2019 1720   RBC 3.57 (L) 05/05/2019 1720   HGB 10.4 (  L) 05/05/2019 1720   HCT 32.2 (L) 05/05/2019 1720   PLT 214.0 05/05/2019 1720   MCV 90.1 05/05/2019 1720   MCH 29.0 07/02/2018 0553   MCHC 32.3 05/05/2019 1720   RDW 15.1 05/05/2019 1720   LYMPHSABS 1.2 05/05/2019 1720   MONOABS 0.5 05/05/2019 1720   EOSABS 0.0 05/05/2019 1720   BASOSABS 0.0 05/05/2019 1720    BMET    Component Value Date/Time   NA 134 (L) 05/05/2019 1720   K 4.3 05/05/2019 1720   CL 94 (L) 05/05/2019 1720   CO2 34 (H) 05/05/2019 1720   GLUCOSE 117 (H) 05/05/2019 1720   BUN 16 05/05/2019 1720   CREATININE 0.61 05/05/2019 1720   CALCIUM 8.9 05/05/2019 1720   GFRNONAA >60 07/02/2018 0553   GFRAA >60 07/02/2018 0553    BNP    Component Value Date/Time   BNP 36.1 12/09/2014 0408    ProBNP    Component Value Date/Time   PROBNP 1,161.0 (H) 05/05/2019 1720    Imaging: Dg Chest 2 View  Result Date: 04/28/2019 CLINICAL DATA:  COPD.  Shortness of breath. EXAM: CHEST - 2 VIEW COMPARISON:  10/13/2018 and prior radiographs FINDINGS: Cardiomegaly and LEFT-sided pacemaker again noted. Pulmonary vascular congestion is identified with small pleural effusions. No definite airspace disease, pneumothorax or dominant mass. No acute bony abnormalities are present. Cervical fusion hardware again noted. IMPRESSION: Cardiomegaly with pulmonary vascular congestion and small bilateral pleural effusions. Electronically Signed   By: Margarette Canada M.D.   On: 04/28/2019 16:59       PFT Results Latest Ref Rng & Units 12/13/2014  FVC-Pre L 1.87  FVC-Predicted Pre % 69  FVC-Post L 1.70  FVC-Predicted Post % 63  Pre FEV1/FVC % % 57  Post FEV1/FCV % % 59  FEV1-Pre L 1.06  FEV1-Predicted Pre % 53  FEV1-Post L 1.01  DLCO UNC% % 30  DLCO COR %Predicted % 77  TLC L 4.98  TLC % Predicted % 101  RV % Predicted % 141    No results found for: NITRICOXIDE      Assessment & Plan:   No problem-specific Assessment & Plan notes found for this encounter.     Rexene Edison, NP 05/13/2019

## 2019-05-13 NOTE — Assessment & Plan Note (Signed)
Continue with home PT.  Physical therapy recommends occupational therapy and home nurse assessment.  Orders placed

## 2019-05-13 NOTE — Assessment & Plan Note (Signed)
Continue on current regimen.  Patient's family is to bring formulary next visit as Stiolto is not covered.  Plan Patient Instructions  Continue on lasix 40mg  daily ,may take extra lasix 40mg  As needed  swelling Continue on  Stiolto 2 puffs daily , rinse after use . Albuterol inhaler 2 puffs every 4hr as needed for wheezing .  Mucinex Twice daily  As needed  For cough/congestion  Use Flutter valve Three times a day  Follow up with cardiology as planned  I will call with echo results  Continue with home PT.  Please call for drug formulary .  Continue on Oxygen 2l/m  Order for Occupational and home nurse assessment.  Follow up with Dr. Melvyn Novas or Parrett in 3-4  Weeks   Please contact office for sooner follow up if symptoms do not improve or worsen or seek emergency care

## 2019-05-13 NOTE — Patient Instructions (Addendum)
Continue on lasix 40mg  daily ,may take extra lasix 40mg  As needed  swelling Continue on  Stiolto 2 puffs daily , rinse after use . Albuterol inhaler 2 puffs every 4hr as needed for wheezing .  Mucinex Twice daily  As needed  For cough/congestion  Use Flutter valve Three times a day  Follow up with cardiology as planned  I will call with echo results  Continue with home PT.  Please call for drug formulary .  Continue on Oxygen 2l/m  Order for Occupational and home nurse assessment.  Follow up with Dr. Melvyn Novas or Effie Wahlert in 3-4  Weeks   Please contact office for sooner follow up if symptoms do not improve or worsen or seek emergency care

## 2019-05-13 NOTE — Assessment & Plan Note (Signed)
Continue on oxygen.  O2 saturation goal greater than 88 to 90%

## 2019-05-13 NOTE — Assessment & Plan Note (Signed)
Decompensated diastolic congestive heart failure with elevated BNP and bilateral pleural effusions Improved with diuresis.  Keep follow-up with cardiology next week as planned.  Echo results are pending. Continue on Lasix  Plan  Patient Instructions  Continue on lasix 40mg  daily ,may take extra lasix 40mg  As needed  swelling Continue on  Stiolto 2 puffs daily , rinse after use . Albuterol inhaler 2 puffs every 4hr as needed for wheezing .  Mucinex Twice daily  As needed  For cough/congestion  Use Flutter valve Three times a day  Follow up with cardiology as planned  I will call with echo results  Continue with home PT.  Please call for drug formulary .  Continue on Oxygen 2l/m  Order for Occupational and home nurse assessment.  Follow up with Dr. Melvyn Novas or Parrett in 3-4  Weeks   Please contact office for sooner follow up if symptoms do not improve or worsen or seek emergency care

## 2019-05-17 DIAGNOSIS — I5033 Acute on chronic diastolic (congestive) heart failure: Secondary | ICD-10-CM | POA: Diagnosis not present

## 2019-05-17 DIAGNOSIS — I110 Hypertensive heart disease with heart failure: Secondary | ICD-10-CM | POA: Diagnosis not present

## 2019-05-17 DIAGNOSIS — J9611 Chronic respiratory failure with hypoxia: Secondary | ICD-10-CM | POA: Diagnosis not present

## 2019-05-17 DIAGNOSIS — J441 Chronic obstructive pulmonary disease with (acute) exacerbation: Secondary | ICD-10-CM | POA: Diagnosis not present

## 2019-05-17 DIAGNOSIS — I442 Atrioventricular block, complete: Secondary | ICD-10-CM | POA: Diagnosis not present

## 2019-05-17 DIAGNOSIS — I2510 Atherosclerotic heart disease of native coronary artery without angina pectoris: Secondary | ICD-10-CM | POA: Diagnosis not present

## 2019-05-17 NOTE — Addendum Note (Signed)
Addended by: Parke Poisson E on: 05/17/2019 04:27 PM   Modules accepted: Orders

## 2019-05-19 DIAGNOSIS — J441 Chronic obstructive pulmonary disease with (acute) exacerbation: Secondary | ICD-10-CM | POA: Diagnosis not present

## 2019-05-19 DIAGNOSIS — I5033 Acute on chronic diastolic (congestive) heart failure: Secondary | ICD-10-CM | POA: Diagnosis not present

## 2019-05-19 DIAGNOSIS — I110 Hypertensive heart disease with heart failure: Secondary | ICD-10-CM | POA: Diagnosis not present

## 2019-05-19 DIAGNOSIS — I2510 Atherosclerotic heart disease of native coronary artery without angina pectoris: Secondary | ICD-10-CM | POA: Diagnosis not present

## 2019-05-19 DIAGNOSIS — J9611 Chronic respiratory failure with hypoxia: Secondary | ICD-10-CM | POA: Diagnosis not present

## 2019-05-19 DIAGNOSIS — I442 Atrioventricular block, complete: Secondary | ICD-10-CM | POA: Diagnosis not present

## 2019-05-20 ENCOUNTER — Ambulatory Visit (INDEPENDENT_AMBULATORY_CARE_PROVIDER_SITE_OTHER): Payer: Medicare Other | Admitting: Medical

## 2019-05-20 ENCOUNTER — Telehealth: Payer: Self-pay | Admitting: Internal Medicine

## 2019-05-20 ENCOUNTER — Encounter: Payer: Self-pay | Admitting: Medical

## 2019-05-20 ENCOUNTER — Other Ambulatory Visit: Payer: Self-pay

## 2019-05-20 VITALS — BP 122/63 | HR 79 | Ht 65.0 in | Wt 164.0 lb

## 2019-05-20 DIAGNOSIS — I5042 Chronic combined systolic (congestive) and diastolic (congestive) heart failure: Secondary | ICD-10-CM

## 2019-05-20 MED ORDER — FUROSEMIDE 40 MG PO TABS: 40.0000 mg | ORAL_TABLET | Freq: Every day | ORAL | 3 refills | Status: DC

## 2019-05-20 MED ORDER — METOPROLOL SUCCINATE ER 25 MG PO TB24: 12.5000 mg | ORAL_TABLET | Freq: Every day | ORAL | 2 refills | Status: DC

## 2019-05-20 MED ORDER — POTASSIUM CHLORIDE CRYS ER 20 MEQ PO TBCR: 20.0000 meq | EXTENDED_RELEASE_TABLET | Freq: Every day | ORAL | 3 refills | Status: DC

## 2019-05-20 NOTE — Telephone Encounter (Signed)
Left message for [name] to call back Monday morning.

## 2019-05-20 NOTE — Patient Instructions (Addendum)
Medication Instructions:   Start Metoprolol Succinate 12.5 mg Daily  If you need a refill on your cardiac medications before your next appointment, please call your pharmacy.   Lab work: NONE ordered at this time of appointment   If you have labs (blood work) drawn today and your tests are completely normal, you will receive your results only by: Marland Kitchen MyChart Message (if you have MyChart) OR . A paper copy in the mail If you have any lab test that is abnormal or we need to change your treatment, we will call you to review the results.  Testing/Procedures: NONE ordered at this time of appointment   Follow-Up: At [facility], you and your health needs are our priority.  As part of our continuing mission to provide you with exceptional heart care, we have created designated Provider Care Teams.  These Care Teams include your primary Cardiologist (physician) and Advanced Practice Providers (APPs -  Physician Assistants and Nurse Practitioners) who all work together to provide you with the care you need, when you need it. . You will need a follow up appointment in 3 months with  Sanda Klein, MD  Any Other Special Instructions Will Be Listed Below (If Applicable).    Heart Failure Eating Plan Heart failure, also called congestive heart failure, occurs when your heart does not pump blood well enough to meet your body's needs for oxygen-rich blood. Heart failure is a long-term (chronic) condition. Living with heart failure can be challenging. However, following your health care provider's instructions about a healthy lifestyle and working with a diet and nutrition specialist (dietitian) to choose the right foods may help to improve your symptoms. What are tips for following this plan? Reading food labels  Check food labels for the amount of sodium per serving. Choose foods that have less than 140 mg (milligrams) of sodium in each serving.  Check food labels for the number of calories per  serving. This is important if you need to limit your daily calorie intake to lose weight.  Check food labels for the serving size. If you eat more than one serving, you will be eating more sodium and calories than what is listed on the label.  Look for foods that are labeled as "sodium-free," "very low sodium," or "low sodium." ? Foods labeled as "reduced sodium" or "lightly salted" may still have more sodium than what is recommended for you. Cooking  Avoid adding salt when cooking. Ask your health care provider or dietitian before using salt substitutes.  Season food with salt-free seasonings, spices, or herbs. Check the label of seasoning mixes to make sure they do not contain salt.  Cook with heart-healthy oils, such as olive, canola, soybean, or sunflower oil.  Do not fry foods. Cook foods using low-fat methods, such as baking, boiling, grilling, and broiling.  Limit unhealthy fats when cooking by: ? Removing the skin from poultry, such as chicken. ? Removing all visible fats from meats. ? Skimming the fat off from stews, soups, and gravies before serving them. Meal planning   Limit your intake of: ? Processed, canned, or pre-packaged foods. ? Foods that are high in trans fat, such as fried foods. ? Sweets, desserts, sugary drinks, and other foods with added sugar. ? Full-fat dairy products, such as whole milk.  Eat a balanced diet that includes: ? 4-5 servings of fruit each day and 4-5 servings of vegetables each day. At each meal, try to fill half of your plate with fruits and vegetables. ?  Up to 6-8 servings of whole grains each day. ? Up to 2 servings of lean meat, poultry, or fish each day. One serving of meat is equal to 3 oz. This is about the same size as a deck of cards. ? 2 servings of low-fat dairy each day. ? Heart-healthy fats. Healthy fats called omega-3 fatty acids are found in foods such as flaxseed and cold-water fish like sardines, salmon, and mackerel.  Aim  to eat 25-35 g (grams) of fiber a day. Foods that are high in fiber include apples, broccoli, carrots, beans, peas, and whole grains.  Do not add salt or condiments that contain salt (such as soy sauce) to foods before eating.  When eating at a restaurant, ask that your food be prepared with less salt or no salt, if possible.  Try to eat 2 or more vegetarian meals each week.  Eat more home-cooked food and eat less restaurant, buffet, and fast food. General information  Do not eat more than 2,300 mg of salt (sodium) a day. The amount of sodium that is recommended for you may be lower, depending on your condition.  Maintain a healthy body weight as directed. Ask your health care provider what a healthy weight is for you. ? Check your weight every day. ? Work with your health care provider and dietitian to make a plan that is right for you to lose weight or maintain your current weight.  Limit how much fluid you drink. Ask your health care provider or dietitian how much fluid you can have each day.  Limit or avoid alcohol as told by your health care provider or dietitian. Recommended foods The items listed may not be a complete list. Talk with your dietitian about what dietary choices are best for you. Fruits All fresh, frozen, and canned fruits. Dried fruits, such as raisins, prunes, and cranberries. Vegetables All fresh vegetables. Vegetables that are frozen without sauce or added salt. Low-sodium or sodium-free canned vegetables. Grains Bread with less than 80 mg of sodium per slice. Whole-wheat pasta, quinoa, and brown rice. Oats and oatmeal. Barley. [place]. Grits and cream of wheat. Whole-grain and whole-wheat cold cereal. Meats and other protein foods Lean cuts of meat. Skinless chicken and [place]. Fish with high omega-3 fatty acids, such as salmon, sardines, and other cold-water fishes. Eggs. Dried beans, peas, and edamame. Unsalted nuts and nut butters. Dairy Low-fat or nonfat  (skim) milk and dried milk. Rice milk, soy milk, and almond milk. Low-fat or nonfat yogurt. Small amounts of reduced-sodium block cheese. Low-sodium cottage cheese. Fats and oils Olive, canola, soybean, flaxseed, or sunflower oil. Avocado. Sweets and desserts Apple sauce. Granola bars. Sugar-free pudding and gelatin. Frozen fruit bars. Seasoning and other foods Fresh and dried herbs. Lemon or lime juice. Vinegar. Low-sodium ketchup. Salt-free marinades, salad dressings, sauces, and seasonings. The items listed above may not be a complete list of foods and beverages you can eat. Contact a dietitian for more information. Foods to avoid The items listed may not be a complete list. Talk with your dietitian about what dietary choices are best for you. Fruits Fruits that are dried with sodium-containing preservatives. Vegetables Canned vegetables. Frozen vegetables with sauce or seasonings. Creamed vegetables. [place] fries. Onion rings. Pickled vegetables and sauerkraut. Grains Bread with more than 80 mg of sodium per slice. Hot or cold cereal with more than 140 mg sodium per serving. Salted pretzels and crackers. Pre-packaged breadcrumbs. Bagels, croissants, and biscuits. Meats and other protein foods Ribs and chicken  wings. Bacon, ham, pepperoni, bologna, salami, and packaged luncheon meats. Hot dogs, bratwurst, and sausage. Canned meat. Smoked meat and fish. Salted nuts and seeds. Dairy Whole milk, half-and-half, and cream. Buttermilk. Processed cheese, cheese spreads, and cheese curds. Regular cottage cheese. Feta cheese. Shredded cheese. String cheese. Fats and oils Butter, lard, shortening, ghee, and bacon fat. Canned and packaged gravies. Seasoning and other foods Onion salt, garlic salt, table salt, and sea salt. Marinades. Regular salad dressings. Relishes, pickles, and olives. Meat flavorings and tenderizers, and bouillon cubes. Horseradish, ketchup, and mustard. Worcestershire sauce.  Teriyaki sauce, soy sauce (including reduced sodium). Hot sauce and Tabasco sauce. Steak sauce, fish sauce, oyster sauce, and cocktail sauce. Taco seasonings. Barbecue sauce. Tartar sauce. The items listed above may not be a complete list of foods and beverages you should avoid. Contact a dietitian for more information. Summary  A heart failure eating plan includes changes that limit your intake of sodium and unhealthy fat, and it may help you lose weight or maintain a healthy weight. Your health care provider may also recommend limiting how much fluid you drink.  Most people with heart failure should eat no more than 2,300 mg of salt (sodium) a day. The amount of sodium that is recommended for you may be lower, depending on your condition.  Contact your health care provider or dietitian before making any major changes to your diet. This information is not intended to replace advice given to you by your health care provider. Make sure you discuss any questions you have with your health care provider. Document Released: 01/02/2017 Document Revised: 10/14/2018 Document Reviewed: 01/02/2017 Elsevier Patient Education  2020 [employer].  Heart Failure Education: 1. Weigh yourself EVERY morning after you go to the bathroom but before you eat or drink anything. Write this number down in a weight log/diary. If you gain 3 pounds overnight or 5 pounds in a week, call the office. 2. Take your medicines as prescribed. If you have concerns about your medications, please call us before you stop taking them.  3. Eat low salt foods-Limit salt (sodium) to 2000 mg per day. This will help prevent your body from holding onto fluid. Read food labels as many processed foods have a lot of sodium, especially canned goods and prepackaged meats. If you would like some assistance choosing low sodium foods, we would be happy to set you up with a nutritionist. 4. Stay as active as you can everyday. Staying active will give  you more energy and make your muscles stronger. Start with 5 minutes at a time and work your way up to 30 minutes a day. Break up your activities--do some in the morning and some in the afternoon. Start with 3 days per week and work your way up to 5 days as you can.  If you have chest pain, feel short of breath, dizzy, or lightheaded, STOP. If you don't feel better after a short rest, call 911. If you do feel better, call the office to let us know you have symptoms with exercise. 5. Limit all fluids for the day to less than 2 liters. Fluid includes all drinks, coffee, juice, ice chips, soup, jello, and all other liquids.

## 2019-05-20 NOTE — Progress Notes (Signed)
Cardiology Office Note   Date:  05/22/2019   ID:  [name], [name] 08-05-1941, MRN [id number]  PCP:  Esaw Grandchild, NP  Cardiologist:  Sanda Klein, MD EP: None  Chief Complaint  Patient presents with   Follow-up    CHF      History of Present Illness: [name] is a 78 y.o. female with PMH of CHB s/p PPM in 2009 with generator change in [id number], chronic diastolic CHF, HTN, COPD with chronic respiratory insufficiency on home O2, who presents for follow-up of CHF.  She was last evaluated by cardiology at an outpatient visit with Dr. Sallyanne Kuster 09/2018, at which time she was doing fine from a cardiac standpoint. Her PPM device was functioning normally on remote checks. She had some confusion with her medications with 2 ARBs being listed. She was recommended to take losartan 50mg  daily. Her last ischemic evaluation was a cardiac catheterization in 2016 which showed mild stenosis in her 1st diagonal otherwise normal coronaries. Her last echocardiogram was 05/13/2019 which revealed EF 45-50% (previously 50-55% in 2016), G2DD, diffuse LV and inferoseptal wall hypokinesis, severely dilated LA, and a tethered appearing posterior mitral valve leaflet. Recent echo was performed after patient presented to her pulmonologist 2 weeks ago with complaints of SOB, LE edema, and weight gain. CXR revealed pulmonary vascular congestion and her BNP was elevated to 1161. She was diuresed with po lasix 40mg  daily and referred back to Cardiology for ongoing management of her acute combined CHF.  She presents today with her daughter. She reports improvement in her breathing and swelling since taking lasix. Daughter reports symptoms started back in July and were felt to be related to COPD, however a couple round of antibiotics did not improve her symptoms. Her daughter reports poor appetite and that her mother has lost 40lbs over the past year and feels a significant amount of that loss is muscle loss. She  denies any chest pain, palpitations, dizziness, lightheadedness, or syncope. No complaints of exacerbation of chronic SOB/DOE, orthopnea, PND, or LE edema at this time.     Past Medical History:  Diagnosis Date   Arthritis    "hands, spine" (12/07/2014)   CHB (complete heart block) ([place]) 09/19/2013   CHF (congestive heart failure) ([place]) dx'd 12/06/2014   Chronic pain    "since neck OR in 2007"   Coronary artery disease    normal coronaries by 09/10/00 cath   Diabetes mellitus    hga1c 7.1 no meds   Dysrhythmia    HTN (hypertension) 09/19/2013   Hypercholesterolemia    Hyperlipidemia 09/19/2013   Hypertension    Myocardial infarction 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   "after spinal cord injury/OR"   Shortness of breath    exertional   Type II diabetes mellitus ([place])    "had it years ago; lost weight after neck OR in 2007; glucose went way down; recently dx'd as returned recently" (12/07/2014)   Urinary incontinence    "since 2007's OR"   Venous insufficiency (chronic) (peripheral)     Past Surgical History:  Procedure Laterality Date   ANTERIOR CERVICAL DECOMP/DISCECTOMY FUSION  2007   BACK SURGERY     CARDIAC CATHETERIZATION  09/10/2000   normal Coronary arteries, EF60%,    CARDIAC PACEMAKER PLACEMENT  12/01/2007   St.Jude Zephyr XLDR [id number], dual chamber   CARDIOVASCULAR STRESS TEST  08/28/2000  Cardiolite perfusion study, EF58%, low risk study,    DILATION AND CURETTAGE OF UTERUS  1960's   KNEE ARTHROSCOPY Right    LAPAROSCOPIC CHOLECYSTECTOMY  1995   LEFT AND RIGHT HEART CATHETERIZATION WITH CORONARY ANGIOGRAM N/A 12/14/2014   Procedure: LEFT AND RIGHT HEART CATHETERIZATION WITH CORONARY ANGIOGRAM;  Surgeon: Peter M Martinique, MD;  Location: [facility] CATH LAB;  Service: Cardiovascular;  Laterality: N/A;   Lower Ext. Dopplers  06/12/2011   no evidence of thrombus, all vessels normal in size,  no evidence of insuffiency   LUMBAR LAMINECTOMY/DECOMPRESSION MICRODISCECTOMY  03/02/2012   Procedure: LUMBAR LAMINECTOMY/DECOMPRESSION MICRODISCECTOMY 2 LEVELS;  Surgeon: Floyce Stakes, MD;  Location: [place] NEURO ORS;  Service: Neurosurgery;  Laterality: Bilateral;  Lumbar three, lumbar four, lumbar five Laminectomy/Foraminotomy   NM MYOCAR PERF WALL MOTION  10/24/2008   protocol:Lexiscan, EF63%, normal study, scan negative for ischemia   PPM GENERATOR CHANGEOUT N/A 05/26/2018   Procedure: PPM GENERATOR CHANGEOUT;  Surgeon: Sanda Klein, MD;  Location: [place] CV LAB;  Service: Cardiovascular;  Laterality: N/A;   TRANSTHORACIC ECHOCARDIOGRAM  11/30/2007   normal echo, mean transaortic valve gradient 59mmHg     Current Outpatient Medications  Medication Sig Dispense Refill   acetaminophen (TYLENOL) 500 MG tablet Take 500 mg by mouth 4 (four) times daily -  with meals and at bedtime.     albuterol (VENTOLIN HFA) 108 (90 Base) MCG/ACT inhaler Inhale 2 puffs into the lungs every 4 (four) hours as needed for wheezing or shortness of breath. 8 g 5   aspirin EC 81 MG tablet Take 81 mg by mouth daily.     DULoxetine (CYMBALTA) 20 MG capsule Take 1 capsule (20 mg total) by mouth daily. 90 capsule 1   furosemide (LASIX) 40 MG tablet Take 1 tablet (40 mg total) by mouth daily. May take an extra tablet for weight gain of 2 lb in a day and 5 lbs in a weeks. 95 tablet 3   ibuprofen (ADVIL,MOTRIN) 200 MG tablet Take 200 mg by mouth every 6 (six) hours as needed.     losartan (COZAAR) 50 MG tablet Take 1 tablet (50 mg total) by mouth daily. 90 tablet 3   meloxicam (MOBIC) 15 MG tablet Take 15 mg by mouth daily.     metFORMIN (GLUCOPHAGE) 500 MG tablet 1/2 tablet nightly with dinner 90 tablet 1   OXYGEN 2lpm with sleep and exertion [employer]     potassium chloride SA (KLOR-CON M20) 20 MEQ tablet Take 1 tablet (20 mEq total) by mouth daily. 95 tablet 3   pravastatin (PRAVACHOL) 40 MG  tablet Take 40 mg by mouth daily.     Respiratory Therapy Supplies (FLUTTER) DEVI Use flutter device three times a day as instructed 1 each 0   Tiotropium Bromide-Olodaterol (STIOLTO RESPIMAT) 2.5-2.5 MCG/ACT AERS Inhale 2 puffs into the lungs daily. 2 g 0   metoprolol succinate (TOPROL-XL) 25 MG 24 hr tablet Take 0.5 tablets (12.5 mg total) by mouth daily. Take with or immediately following a meal. 15 tablet 2   No current facility-administered medications for this visit.     Allergies:   Patient has no known allergies.    Social History:  The patient  reports that she quit smoking about 25 years ago. Her smoking use included cigarettes. She started smoking about 57 years ago. She has a 16.00 pack-year smoking history. She has never used smokeless tobacco. She reports that she does not drink alcohol or use drugs.   Family  History:  The patient's family history includes Congestive Heart Failure in her brother, father, and mother; Diabetes in her brother, father, and mother; Heart attack in her father; Heart disease in her father and mother; Hyperlipidemia in her father and mother; Hypertension in her father and mother.    ROS:  Please see the history of present illness.   Otherwise, review of systems are positive for none.   All other systems are reviewed and negative.    PHYSICAL EXAM: VS:  BP 122/63    Pulse 79    Ht 5\' 5"  (1.651 m)    Wt 164 lb (74.4 kg)    SpO2 96%    BMI 27.29 kg/m  , BMI Body mass index is 27.29 kg/m. GEN: Sitting in a wheelchair in no acute distress HEENT: sclera anicteric Neck: no JVD, carotid bruits, or masses Cardiac: RRR; no murmurs, rubs, or gallops, no edema  Respiratory:  clear to auscultation bilaterally, normal work of breathing GI: soft, nontender, nondistended, + BS MS: no deformity or atrophy Skin: warm and dry, no rash Neuro:  Strength and sensation are intact Psych: euthymic mood, full affect   EKG:  EKG is ordered today. The ekg ordered  today demonstrates A-sensed, V-paced rhythm with rate 78bpm.    Recent Labs: 07/01/2018: Magnesium 2.1 10/13/2018: TSH 0.89 05/05/2019: Hemoglobin 10.4; Platelets 214.0; Pro B Natriuretic peptide (BNP) 1,161.0 05/20/2019: BUN 24; Creatinine, Ser 0.64; Potassium 4.6; Sodium 134    Lipid Panel    Component Value Date/Time   CHOL  11/30/2007 0510    158        ATP III CLASSIFICATION:  <200     mg/dL   Desirable  200-239  mg/dL   Borderline High  >=240    mg/dL   High   TRIG 174 (H) 11/30/2007 0510   HDL 43 11/30/2007 0510   CHOLHDL 3.7 11/30/2007 0510   VLDL 35 11/30/2007 0510   LDLCALC  11/30/2007 0510    80        Total Cholesterol/HDL:CHD Risk Coronary Heart Disease Risk Table                     Men   Women  1/2 Average Risk   3.4   3.3      Wt Readings from Last 3 Encounters:  05/20/19 164 lb (74.4 kg)  05/13/19 163 lb (73.9 kg)  05/05/19 168 lb 9.6 oz (76.5 kg)      Other studies Reviewed: Additional studies/ records that were reviewed today include:   Echocardiogram 05/13/2019: 1. Moderate hypokinesis of the left ventricular, entire inferoseptal wall.  2. The left ventricle has mildly reduced systolic function, with an ejection fraction of 45-50%. The cavity size was mild to moderately dilated. Left ventricular diastolic Doppler parameters are consistent with pseudonormalization. Elevated left atrial  and left ventricular end-diastolic pressures The E/e' is 16. There is abnormal septal motion consistent with RV pacemaker. Left ventricular diffuse hypokinesis.  3. The right ventricle has normal systolic function. The cavity was mildly enlarged. There is no increase in right ventricular wall thickness. Right ventricular systolic pressure is mildly elevated.  4. Left atrial size was severely dilated.  5. Right atrial size was mildly dilated.  6. The mitral valve is abnormal. Moderate thickening of the mitral valve leaflet. Moderate calcification of the mitral valve  leaflet. Mitral valve regurgitation is moderate by color flow Doppler.  7. Tethered posterior leaflet.  8. Tricuspid valve regurgitation is moderate.  9. The aortic valve is tricuspid. Moderate thickening of the aortic valve. Moderate calcification of the aortic valve. Aortic valve regurgitation is mild to moderate by color flow Doppler. Mild stenosis of the aortic valve. 10. The aorta is normal unless otherwise noted. 11. The aortic root, ascending aorta and aortic arch are normal in size and structure.  SUMMARY   Since last study, LV cavity more dilated, EF lower. There is septal dyssynchrony from pacing, but the inferoseptum also does not thicken. Posterior mitral valve leaflet appears tethered  Right/left heart catheterization 2016: Final Conclusions:   1. No significant CAD 2. Normal LV function.  3. Normal right heart pressures and low to normal LV filling pressures.   Recommendations: Medical management. Will DC IV lasix. Consider adding lower dose oral lasix in am.    ASSESSMENT AND PLAN:  1. Chronic combined CHF: patient has had increased SOB this summer not responsive to antibiotics for possible COPD exacerbation. Recent CXR and BNP suggesting acute on chronic CHF. Echo 05/13/2019 with EF 45-50% (previously 50-55% in 2016), with LV hypokinesis and mild to moderate dilation. Symptoms improved with lasix and daughter reports steady decrease in weights.  - Unlikely this is related to ischemia as she had a normal cardiac cath in 2016, therefore will not pursue any ischemic testing at this time - Possible this is tachycardia mediated due to underlying arrhythmia so will ask the device clinic to check a remote transmission as symptoms started after her last transmission in July which showed frequent sometimes sustained Atach with CHB but no true Afib - Will start metoprolol succinate 12.5mg  daily and uptitrate as BP will allow. - Continue losartan and lasix - We discussed the  importance of monitoring weight daily with plans to take an additional lasix for weight gain of 2lbs in 1 day or 5lbs in 1 week. She will take potassium 20 mEq with each dose of lasix.  - Also discussed importance of maintaining a low sodium diet.  - Will check a BMET for close monitoring of kidney function and electrolytes  2. HTN: BP 122/63 today - Will add low dose BBlocker as above - Continue losartan and lasix  3. CHB s/p PPM: last device interrogation 03/2019 showed frequent, sometimes sustained Atach with CHB but no true Afib - Will ask the device clinic to obtain a remote transmission to evaluate for underlying arrhythmia that may be contributing to #1.   4. Mitral regurgitation: moderate on echo 05/13/2019 with evidence of tethered mitral valve leaflet. Possible this is functional in the setting of #1.  - Will continue to monitor with routine echos  5. Aortic insufficiency: mild to moderate on echo 05/13/2019.  - Will continue to monitor with routine echos.    Current medicines are reviewed at length with the patient today.  The patient does not have concerns regarding medicines.  The following changes have been made:  START metoprolol succinate 12.5mg  daily  Labs/ tests ordered today include:   Orders Placed This Encounter  Procedures   Basic metabolic panel   EKG [id number]     Disposition:   FU with Dr. Sallyanne Kuster in 3 months  Signed, Abigail Butts, PA-C  05/22/2019 7:05 AM

## 2019-05-21 LAB — BASIC METABOLIC PANEL
BUN/Creatinine Ratio: 38 — ABNORMAL HIGH (ref 12–28)
BUN: 24 mg/dL (ref 8–27)
CO2: 25 mmol/L (ref 20–29)
Calcium: 9.3 mg/dL (ref 8.7–10.3)
Chloride: 95 mmol/L — ABNORMAL LOW (ref 96–106)
Creatinine, Ser: 0.64 mg/dL (ref 0.57–1.00)
GFR calc Af Amer: 98 mL/min/{1.73_m2} (ref >59)
GFR calc non Af Amer: 85 mL/min/{1.73_m2} (ref >59)
Glucose: 102 mg/dL — ABNORMAL HIGH (ref 65–99)
Potassium: 4.6 mmol/L (ref 3.5–5.2)
Sodium: 134 mmol/L (ref 134–144)

## 2019-05-22 ENCOUNTER — Encounter: Payer: Self-pay | Admitting: Medical

## 2019-05-22 NOTE — Progress Notes (Signed)
There is no download since you saw her. Did you tell her to do an ad hoc download?

## 2019-05-23 ENCOUNTER — Other Ambulatory Visit: Payer: Self-pay | Admitting: Medical

## 2019-05-23 ENCOUNTER — Telehealth: Payer: Self-pay | Admitting: Cardiovascular Disease

## 2019-05-23 ENCOUNTER — Telehealth: Payer: Self-pay | Admitting: *Deleted

## 2019-05-23 DIAGNOSIS — I5042 Chronic combined systolic (congestive) and diastolic (congestive) heart failure: Secondary | ICD-10-CM

## 2019-05-23 MED ORDER — METOPROLOL TARTRATE 50 MG PO TABS: 50.0000 mg | ORAL_TABLET | Freq: Once | ORAL | 0 refills | Status: DC

## 2019-05-23 NOTE — Telephone Encounter (Signed)
-----   Message from Abigail Butts, PA-C sent at 05/22/2019  7:07 AM EDT ----- Hey there - hoping we can get an early remote transmission for this patient. Recent echo shows a slight drop in EF to 45-50%. She had a clean cath in 2016. Device check in July showed some Atach but no Afib - wondering if this is occurring more frequently causing CHF. Thanks!

## 2019-05-23 NOTE — Telephone Encounter (Signed)
Transmission received 05/23/19 at 09:48. Presenting rhythm AS/VP @ 91bpm. <1% AT/AF burden since 09/08/18--all available EGMs show AT, longest 14sec. No high ventricular rate episodes. Lead trends stable. Histograms stable.

## 2019-05-23 NOTE — Progress Notes (Signed)
I reviewed the current echo and compared with one from 2016 and there is indeed a reduction in left ventricular systolic function.   We cannot blame it on pacing since she is always had the 100% ventricular pacing.   Also cannot blame it on the atrial tachycardia (episodes are brief, overall burden is less than 1%, she does not develop high ventricular rates during atrial tachycardia due to complete heart block).   The pattern of hypokinesis is global, but I still feel compelled to make sure she has not developed coronary artery disease.   I recommend scheduling her for coronary CT angiogram.  If she does not have coronary stenoses, the treatment would be conventional treatment for nonischemic cardiomyopathy (beta-blocker, RAAS inhibitors, etc).

## 2019-05-23 NOTE — Telephone Encounter (Signed)
Thanks, [name]! [name], the episodes of ATach are too brief and too few to cause a problem.  She has complete heart block and does not have high ventricular rates with the atrial arrhythmia.

## 2019-05-23 NOTE — Telephone Encounter (Signed)
  Daughter is returning a call but she is not sure from who. Her voicemail was full so a message could not be left. I could not find a note of who called. She is available for call back.

## 2019-05-23 NOTE — Telephone Encounter (Signed)
   Contacted Ms. Saracino and spoke with her daughter, Otila Kluver to update patient in regards to recent transmission which did not show any significant arrhythmias to explain her newly reduced EF. Given unclear etiology, will plan for a coronary CTA to evaluate for ischemia. Rx for metoprolol 50mg  x1 prescribed to be taken 2 hours prior to her CT scan. Patient/daughter in agreement with plan. Message sent to Mack Guise to arrange the test.   Abigail Butts, PA-C 05/23/19; 4:45 PM

## 2019-05-23 NOTE — Progress Notes (Signed)
   Recent device transmission without arrhythmia to explain reduction in LV function, therefore will pursue a coronary CTA to evaluate for ischemic etiology of systolic CHF.  Abigail Butts, PA-C 05/23/19; 3:45 PM

## 2019-05-24 DIAGNOSIS — I5033 Acute on chronic diastolic (congestive) heart failure: Secondary | ICD-10-CM | POA: Diagnosis not present

## 2019-05-24 DIAGNOSIS — I110 Hypertensive heart disease with heart failure: Secondary | ICD-10-CM | POA: Diagnosis not present

## 2019-05-24 DIAGNOSIS — J441 Chronic obstructive pulmonary disease with (acute) exacerbation: Secondary | ICD-10-CM | POA: Diagnosis not present

## 2019-05-24 DIAGNOSIS — I2510 Atherosclerotic heart disease of native coronary artery without angina pectoris: Secondary | ICD-10-CM | POA: Diagnosis not present

## 2019-05-24 DIAGNOSIS — I442 Atrioventricular block, complete: Secondary | ICD-10-CM | POA: Diagnosis not present

## 2019-05-24 DIAGNOSIS — J9611 Chronic respiratory failure with hypoxia: Secondary | ICD-10-CM | POA: Diagnosis not present

## 2019-05-24 NOTE — Telephone Encounter (Signed)
LM for Casey to call back.

## 2019-05-25 ENCOUNTER — Telehealth: Payer: Self-pay | Admitting: Adult Health

## 2019-05-25 ENCOUNTER — Telehealth: Payer: Self-pay | Admitting: Cardiovascular Disease

## 2019-05-25 ENCOUNTER — Telehealth: Payer: Self-pay | Admitting: Internal Medicine

## 2019-05-25 NOTE — Telephone Encounter (Signed)
There are 2 messages on this patient Unsure who called [name] and why - there is no documentation in the chart.  LMOM TCB x1

## 2019-05-25 NOTE — Telephone Encounter (Signed)
Sure, she can have sodium free bread.

## 2019-05-25 NOTE — Telephone Encounter (Signed)
New Message     Patient's brother calling to see if patient is allowed to have sodium free bread.  Please call back to advise.

## 2019-05-25 NOTE — Telephone Encounter (Signed)
[name] is returning call. CB is [phone]

## 2019-05-25 NOTE — Telephone Encounter (Signed)
Called brother and advised of message from PharmD.  Patient brother had no other questions.

## 2019-05-25 NOTE — Telephone Encounter (Signed)
There are 2 messages open on this patient  I have PT orders here on patient awaiting TP's signature and will ensure she signs them tomorrow.  LMOM TCB x1

## 2019-05-25 NOTE — Telephone Encounter (Signed)
Can you advise? I'm not sure what sodium free bread is.  Thank you!

## 2019-05-25 NOTE — Telephone Encounter (Signed)
Called spoke with [name]. Let him know orders will be signed tomorrow. Merry Proud said a verbal order will be good as well when TP approves the order. Will route to JEss to follow up on as she has the papers for TP

## 2019-05-25 NOTE — Telephone Encounter (Signed)
LMTCB x3 for [facility] with [employer]. We have attempted to contact [place] several times with no success or call back from her. Per triage protocol, message will be closed.

## 2019-05-26 DIAGNOSIS — I2510 Atherosclerotic heart disease of native coronary artery without angina pectoris: Secondary | ICD-10-CM | POA: Diagnosis not present

## 2019-05-26 DIAGNOSIS — J441 Chronic obstructive pulmonary disease with (acute) exacerbation: Secondary | ICD-10-CM | POA: Diagnosis not present

## 2019-05-26 DIAGNOSIS — I110 Hypertensive heart disease with heart failure: Secondary | ICD-10-CM | POA: Diagnosis not present

## 2019-05-26 DIAGNOSIS — I442 Atrioventricular block, complete: Secondary | ICD-10-CM | POA: Diagnosis not present

## 2019-05-26 DIAGNOSIS — J9611 Chronic respiratory failure with hypoxia: Secondary | ICD-10-CM | POA: Diagnosis not present

## 2019-05-26 DIAGNOSIS — I5033 Acute on chronic diastolic (congestive) heart failure: Secondary | ICD-10-CM | POA: Diagnosis not present

## 2019-05-27 DIAGNOSIS — I2510 Atherosclerotic heart disease of native coronary artery without angina pectoris: Secondary | ICD-10-CM | POA: Diagnosis not present

## 2019-05-27 DIAGNOSIS — J9611 Chronic respiratory failure with hypoxia: Secondary | ICD-10-CM | POA: Diagnosis not present

## 2019-05-27 DIAGNOSIS — I110 Hypertensive heart disease with heart failure: Secondary | ICD-10-CM | POA: Diagnosis not present

## 2019-05-27 DIAGNOSIS — J441 Chronic obstructive pulmonary disease with (acute) exacerbation: Secondary | ICD-10-CM | POA: Diagnosis not present

## 2019-05-27 DIAGNOSIS — I5033 Acute on chronic diastolic (congestive) heart failure: Secondary | ICD-10-CM | POA: Diagnosis not present

## 2019-05-27 DIAGNOSIS — I442 Atrioventricular block, complete: Secondary | ICD-10-CM | POA: Diagnosis not present

## 2019-05-27 NOTE — Telephone Encounter (Signed)
Message routed to Jessica

## 2019-05-27 NOTE — Telephone Encounter (Signed)
LMTCB x2 for [facility] with [employer].

## 2019-05-30 NOTE — Telephone Encounter (Signed)
LMTCB. 3 messages have been left. Per protocol close encounter.

## 2019-05-31 DIAGNOSIS — J441 Chronic obstructive pulmonary disease with (acute) exacerbation: Secondary | ICD-10-CM | POA: Diagnosis not present

## 2019-05-31 DIAGNOSIS — I442 Atrioventricular block, complete: Secondary | ICD-10-CM | POA: Diagnosis not present

## 2019-05-31 DIAGNOSIS — I5033 Acute on chronic diastolic (congestive) heart failure: Secondary | ICD-10-CM | POA: Diagnosis not present

## 2019-05-31 DIAGNOSIS — I2510 Atherosclerotic heart disease of native coronary artery without angina pectoris: Secondary | ICD-10-CM | POA: Diagnosis not present

## 2019-05-31 DIAGNOSIS — I110 Hypertensive heart disease with heart failure: Secondary | ICD-10-CM | POA: Diagnosis not present

## 2019-05-31 DIAGNOSIS — J9611 Chronic respiratory failure with hypoxia: Secondary | ICD-10-CM | POA: Diagnosis not present

## 2019-05-31 NOTE — Telephone Encounter (Signed)
Apologies - this was signed by TP and faxed back to [facility] on Friday 9.25.2020.  LMOM TCB x1 for Merry Proud to ensure fax was received.

## 2019-05-31 NOTE — Telephone Encounter (Signed)
Tammy Billow, do you have any update?

## 2019-06-01 NOTE — Telephone Encounter (Signed)
Spoke with Merry Proud. These documents were received. Nothing further was needed.

## 2019-06-02 ENCOUNTER — Telehealth (HOSPITAL_COMMUNITY): Payer: Self-pay | Admitting: Emergency Medicine

## 2019-06-02 DIAGNOSIS — I5033 Acute on chronic diastolic (congestive) heart failure: Secondary | ICD-10-CM | POA: Diagnosis not present

## 2019-06-02 DIAGNOSIS — I110 Hypertensive heart disease with heart failure: Secondary | ICD-10-CM | POA: Diagnosis not present

## 2019-06-02 DIAGNOSIS — J9611 Chronic respiratory failure with hypoxia: Secondary | ICD-10-CM | POA: Diagnosis not present

## 2019-06-02 DIAGNOSIS — I2510 Atherosclerotic heart disease of native coronary artery without angina pectoris: Secondary | ICD-10-CM | POA: Diagnosis not present

## 2019-06-02 DIAGNOSIS — J441 Chronic obstructive pulmonary disease with (acute) exacerbation: Secondary | ICD-10-CM | POA: Diagnosis not present

## 2019-06-02 DIAGNOSIS — I442 Atrioventricular block, complete: Secondary | ICD-10-CM | POA: Diagnosis not present

## 2019-06-02 NOTE — Telephone Encounter (Signed)
Reaching out to patient to offer assistance regarding upcoming cardiac imaging study; pt verbalizes understanding of appt date/time, parking situation and where to check in, pre-test NPO status and medications ordered, and verified current allergies; name and call back number provided for further questions should they arise Heith Haigler RN Navigator Cardiac [facility] [phone] office [phone] cell 

## 2019-06-03 ENCOUNTER — Other Ambulatory Visit: Payer: Self-pay

## 2019-06-03 ENCOUNTER — Ambulatory Visit (HOSPITAL_COMMUNITY)
Admission: RE | Admit: 2019-06-03 | Discharge: 2019-06-03 | Disposition: A | Discharge status: Home / Self Care | Payer: Medicare Other | Source: Ambulatory Visit | Attending: Medical | Admitting: Medical

## 2019-06-03 DIAGNOSIS — I5042 Chronic combined systolic (congestive) and diastolic (congestive) heart failure: Secondary | ICD-10-CM | POA: Diagnosis not present

## 2019-06-03 MED ORDER — IOHEXOL 350 MG/ML SOLN
100.0000 mL | Freq: Once | INTRAVENOUS | Status: AC | PRN
  Administered 2019-06-03: 16:00:00 100 mL via INTRAVENOUS

## 2019-06-03 MED ORDER — NITROGLYCERIN 0.4 MG SL SUBL
0.8000 mg | SUBLINGUAL_TABLET | Freq: Once | SUBLINGUAL | Status: AC
  Administered 2019-06-03: 0.4 mg via SUBLINGUAL
  Filled 2019-06-03: qty 25

## 2019-06-03 MED ORDER — NITROGLYCERIN 0.4 MG SL SUBL
SUBLINGUAL_TABLET | SUBLINGUAL | Status: AC
  Filled 2019-06-03: qty 1

## 2019-06-03 NOTE — Progress Notes (Signed)
Patient presented for CT with contrast today.  Patient received 55m contrast, 40 mL saline flush without issue.  Once scan complete, CT tech noted large area of swelling to the left medial antecubital fossa. PA to beside to assess.  Swelling noted with significant improvement within minutes during visit.  Range of motion intact.  No erythema or breakdown. Appears to be dissipating as expected.  No immediate concerns.  Patient given contrast extravasation care instructions.   Met with daughter and also provided her with care instructions.  KBrynda Greathouse MS RD PA-C 4:51 PM

## 2019-06-03 NOTE — Progress Notes (Signed)
CT scan completed. Tolerated well. IV infiltrated with saline. Site swollen, no redness. Checked by [place], [place]. Instructions to keep extremity elevated and to apply heat or cold given both to pt and to pt's daughter. D/c home in wheelchair with daughter. Pt awake and alert. In no distress.

## 2019-06-04 DIAGNOSIS — I442 Atrioventricular block, complete: Secondary | ICD-10-CM | POA: Diagnosis not present

## 2019-06-04 DIAGNOSIS — J9611 Chronic respiratory failure with hypoxia: Secondary | ICD-10-CM | POA: Diagnosis not present

## 2019-06-04 DIAGNOSIS — J441 Chronic obstructive pulmonary disease with (acute) exacerbation: Secondary | ICD-10-CM | POA: Diagnosis not present

## 2019-06-04 DIAGNOSIS — I2510 Atherosclerotic heart disease of native coronary artery without angina pectoris: Secondary | ICD-10-CM | POA: Diagnosis not present

## 2019-06-04 DIAGNOSIS — I5033 Acute on chronic diastolic (congestive) heart failure: Secondary | ICD-10-CM | POA: Diagnosis not present

## 2019-06-04 DIAGNOSIS — I110 Hypertensive heart disease with heart failure: Secondary | ICD-10-CM | POA: Diagnosis not present

## 2019-06-04 NOTE — Progress Notes (Signed)
[name], female   DOB: Sep 13, 1940, 78 y.o.   MRN: [id number] Spoke with pt's daughter regarding pt's LUE extravasation during CT yesterday; states her arm has sig less edema, no sig erythema, pain, skin breakdown or paresthesias; HHN also evaluated pt today as well. Cont current conservative measures.

## 2019-06-05 DIAGNOSIS — I2510 Atherosclerotic heart disease of native coronary artery without angina pectoris: Secondary | ICD-10-CM | POA: Diagnosis not present

## 2019-06-06 DIAGNOSIS — Z7984 Long term (current) use of oral hypoglycemic drugs: Secondary | ICD-10-CM | POA: Diagnosis not present

## 2019-06-06 DIAGNOSIS — Z9181 History of falling: Secondary | ICD-10-CM | POA: Diagnosis not present

## 2019-06-06 DIAGNOSIS — G8929 Other chronic pain: Secondary | ICD-10-CM | POA: Diagnosis not present

## 2019-06-06 DIAGNOSIS — M19042 Primary osteoarthritis, left hand: Secondary | ICD-10-CM | POA: Diagnosis not present

## 2019-06-06 DIAGNOSIS — Z87891 Personal history of nicotine dependence: Secondary | ICD-10-CM | POA: Diagnosis not present

## 2019-06-06 DIAGNOSIS — Z9981 Dependence on supplemental oxygen: Secondary | ICD-10-CM | POA: Diagnosis not present

## 2019-06-06 DIAGNOSIS — J441 Chronic obstructive pulmonary disease with (acute) exacerbation: Secondary | ICD-10-CM | POA: Diagnosis not present

## 2019-06-06 DIAGNOSIS — I2510 Atherosclerotic heart disease of native coronary artery without angina pectoris: Secondary | ICD-10-CM | POA: Diagnosis not present

## 2019-06-06 DIAGNOSIS — I442 Atrioventricular block, complete: Secondary | ICD-10-CM | POA: Diagnosis not present

## 2019-06-06 DIAGNOSIS — E7800 Pure hypercholesterolemia, unspecified: Secondary | ICD-10-CM | POA: Diagnosis not present

## 2019-06-06 DIAGNOSIS — M19041 Primary osteoarthritis, right hand: Secondary | ICD-10-CM | POA: Diagnosis not present

## 2019-06-06 DIAGNOSIS — I5033 Acute on chronic diastolic (congestive) heart failure: Secondary | ICD-10-CM | POA: Diagnosis not present

## 2019-06-06 DIAGNOSIS — M47819 Spondylosis without myelopathy or radiculopathy, site unspecified: Secondary | ICD-10-CM | POA: Diagnosis not present

## 2019-06-06 DIAGNOSIS — I110 Hypertensive heart disease with heart failure: Secondary | ICD-10-CM | POA: Diagnosis not present

## 2019-06-06 DIAGNOSIS — I872 Venous insufficiency (chronic) (peripheral): Secondary | ICD-10-CM | POA: Diagnosis not present

## 2019-06-06 DIAGNOSIS — J9611 Chronic respiratory failure with hypoxia: Secondary | ICD-10-CM | POA: Diagnosis not present

## 2019-06-06 DIAGNOSIS — E1142 Type 2 diabetes mellitus with diabetic polyneuropathy: Secondary | ICD-10-CM | POA: Diagnosis not present

## 2019-06-06 DIAGNOSIS — Z950 Presence of cardiac pacemaker: Secondary | ICD-10-CM | POA: Diagnosis not present

## 2019-06-07 ENCOUNTER — Ambulatory Visit (HOSPITAL_COMMUNITY)
Admission: RE | Admit: 2019-06-07 | Discharge: 2019-06-07 | Disposition: A | Discharge status: Home / Self Care | Payer: Medicare Other | Source: Ambulatory Visit | Attending: Medical | Admitting: Medical

## 2019-06-07 DIAGNOSIS — I2510 Atherosclerotic heart disease of native coronary artery without angina pectoris: Secondary | ICD-10-CM | POA: Diagnosis not present

## 2019-06-07 DIAGNOSIS — J441 Chronic obstructive pulmonary disease with (acute) exacerbation: Secondary | ICD-10-CM | POA: Diagnosis not present

## 2019-06-07 DIAGNOSIS — I442 Atrioventricular block, complete: Secondary | ICD-10-CM | POA: Diagnosis not present

## 2019-06-07 DIAGNOSIS — I5042 Chronic combined systolic (congestive) and diastolic (congestive) heart failure: Secondary | ICD-10-CM | POA: Diagnosis not present

## 2019-06-07 DIAGNOSIS — I110 Hypertensive heart disease with heart failure: Secondary | ICD-10-CM | POA: Diagnosis not present

## 2019-06-07 DIAGNOSIS — J9611 Chronic respiratory failure with hypoxia: Secondary | ICD-10-CM | POA: Diagnosis not present

## 2019-06-07 DIAGNOSIS — I5033 Acute on chronic diastolic (congestive) heart failure: Secondary | ICD-10-CM | POA: Diagnosis not present

## 2019-06-08 ENCOUNTER — Ambulatory Visit (INDEPENDENT_AMBULATORY_CARE_PROVIDER_SITE_OTHER): Payer: Medicare Other | Admitting: *Deleted

## 2019-06-08 DIAGNOSIS — I442 Atrioventricular block, complete: Secondary | ICD-10-CM

## 2019-06-08 DIAGNOSIS — I5032 Chronic diastolic (congestive) heart failure: Secondary | ICD-10-CM | POA: Diagnosis not present

## 2019-06-09 ENCOUNTER — Telehealth: Payer: Self-pay | Admitting: Cardiovascular Disease

## 2019-06-09 ENCOUNTER — Telehealth: Payer: Self-pay | Admitting: Adult Health

## 2019-06-09 DIAGNOSIS — I2510 Atherosclerotic heart disease of native coronary artery without angina pectoris: Secondary | ICD-10-CM | POA: Diagnosis not present

## 2019-06-09 DIAGNOSIS — J441 Chronic obstructive pulmonary disease with (acute) exacerbation: Secondary | ICD-10-CM | POA: Diagnosis not present

## 2019-06-09 DIAGNOSIS — I5033 Acute on chronic diastolic (congestive) heart failure: Secondary | ICD-10-CM | POA: Diagnosis not present

## 2019-06-09 DIAGNOSIS — I442 Atrioventricular block, complete: Secondary | ICD-10-CM | POA: Diagnosis not present

## 2019-06-09 DIAGNOSIS — I110 Hypertensive heart disease with heart failure: Secondary | ICD-10-CM | POA: Diagnosis not present

## 2019-06-09 DIAGNOSIS — J9611 Chronic respiratory failure with hypoxia: Secondary | ICD-10-CM | POA: Diagnosis not present

## 2019-06-09 LAB — CUP PACEART REMOTE DEVICE CHECK
Battery Remaining Longevity: 110 mo
Battery Remaining Percentage: 95.5 %
Battery Voltage: 2.99 V
Brady Statistic AP VP Percent: 7.2 %
Brady Statistic AP VS Percent: 1 %
Brady Statistic AS VP Percent: 92 %
Brady Statistic AS VS Percent: 1 %
Brady Statistic RA Percent Paced: 6.6 %
Brady Statistic RV Percent Paced: 99 %
Date Time Interrogation Session: 20201007060029
Implantable Lead Implant Date: 20090401
Implantable Lead Implant Date: 20090401
Implantable Lead Location: 753859
Implantable Lead Location: 753860
Implantable Pulse Generator Implant Date: 20190925
Lead Channel Impedance Value: 380 Ohm
Lead Channel Impedance Value: 440 Ohm
Lead Channel Pacing Threshold Amplitude: 0.5 V
Lead Channel Pacing Threshold Amplitude: 0.875 V
Lead Channel Pacing Threshold Pulse Width: 0.5 ms
Lead Channel Pacing Threshold Pulse Width: 0.5 ms
Lead Channel Sensing Intrinsic Amplitude: 1.7 mV
Lead Channel Sensing Intrinsic Amplitude: 12 mV
Lead Channel Setting Pacing Amplitude: 1.125
Lead Channel Setting Pacing Amplitude: 2 V
Lead Channel Setting Pacing Pulse Width: 0.5 ms
Lead Channel Setting Sensing Sensitivity: 4 mV
Pulse Gen Model: 2272
Pulse Gen Serial Number: 9064942

## 2019-06-09 MED ORDER — PRAVASTATIN SODIUM 40 MG PO TABS: 40.0000 mg | ORAL_TABLET | Freq: Every day | ORAL | 0 refills | Status: DC

## 2019-06-09 NOTE — Telephone Encounter (Signed)
Pravastatin sent to pharmacy.  Patient informed.

## 2019-06-09 NOTE — Telephone Encounter (Signed)
Patient realizes she is way over due for a f/u and labs with [facility]. She has scheduled a morning appt this coming Wednesday for these items but in the mean time she is completely out of her pravastatin and is requesting a refill. If approved please send to [facility] on [address].

## 2019-06-09 NOTE — Addendum Note (Signed)
Addended by: Amado Coe on: 06/09/2019 01:52 PM   Modules accepted: Orders

## 2019-06-09 NOTE — Telephone Encounter (Signed)
Please advise, looks like patient had CT; but I see now results by provider-  Will route to him to advise to look over so we can go over those results. Thank you!

## 2019-06-09 NOTE — Telephone Encounter (Signed)
New message:    Patient daughter calling concerning her mother's results. Please call patient daughter back.

## 2019-06-09 NOTE — Telephone Encounter (Signed)
Called w report on coronary CTA. No significant coronary stenoses, but does have moderate plaque. Recommend adjusting statin for LDL< 70 (she is due to have an updated lipid profile. Note mild, but progressive anemia and complaints of fatigue. Needs repeat CBC and anemia panel.

## 2019-06-10 DIAGNOSIS — I110 Hypertensive heart disease with heart failure: Secondary | ICD-10-CM | POA: Diagnosis not present

## 2019-06-10 DIAGNOSIS — I5033 Acute on chronic diastolic (congestive) heart failure: Secondary | ICD-10-CM | POA: Diagnosis not present

## 2019-06-10 DIAGNOSIS — J441 Chronic obstructive pulmonary disease with (acute) exacerbation: Secondary | ICD-10-CM | POA: Diagnosis not present

## 2019-06-10 DIAGNOSIS — I2510 Atherosclerotic heart disease of native coronary artery without angina pectoris: Secondary | ICD-10-CM | POA: Diagnosis not present

## 2019-06-10 DIAGNOSIS — J9611 Chronic respiratory failure with hypoxia: Secondary | ICD-10-CM | POA: Diagnosis not present

## 2019-06-10 DIAGNOSIS — I442 Atrioventricular block, complete: Secondary | ICD-10-CM | POA: Diagnosis not present

## 2019-06-11 ENCOUNTER — Other Ambulatory Visit: Payer: Self-pay | Admitting: Adult Health

## 2019-06-13 ENCOUNTER — Other Ambulatory Visit: Payer: Self-pay

## 2019-06-13 DIAGNOSIS — E08621 Diabetes mellitus due to underlying condition with foot ulcer: Secondary | ICD-10-CM

## 2019-06-13 NOTE — Progress Notes (Signed)
Pt's daughter in office today and states that pt has a diabetic foot ulcer.  Per Mina Marble, NP, referral placed to podiatry.  Charyl Bigger, CMA

## 2019-06-14 DIAGNOSIS — I5033 Acute on chronic diastolic (congestive) heart failure: Secondary | ICD-10-CM | POA: Diagnosis not present

## 2019-06-14 DIAGNOSIS — J9611 Chronic respiratory failure with hypoxia: Secondary | ICD-10-CM | POA: Diagnosis not present

## 2019-06-14 DIAGNOSIS — J441 Chronic obstructive pulmonary disease with (acute) exacerbation: Secondary | ICD-10-CM | POA: Diagnosis not present

## 2019-06-14 DIAGNOSIS — L820 Inflamed seborrheic keratosis: Secondary | ICD-10-CM | POA: Diagnosis not present

## 2019-06-14 DIAGNOSIS — I110 Hypertensive heart disease with heart failure: Secondary | ICD-10-CM | POA: Diagnosis not present

## 2019-06-14 DIAGNOSIS — I2510 Atherosclerotic heart disease of native coronary artery without angina pectoris: Secondary | ICD-10-CM | POA: Diagnosis not present

## 2019-06-14 DIAGNOSIS — I442 Atrioventricular block, complete: Secondary | ICD-10-CM | POA: Diagnosis not present

## 2019-06-15 ENCOUNTER — Ambulatory Visit (INDEPENDENT_AMBULATORY_CARE_PROVIDER_SITE_OTHER): Payer: Medicare Other | Admitting: Adult Health

## 2019-06-15 ENCOUNTER — Other Ambulatory Visit: Payer: Self-pay

## 2019-06-15 ENCOUNTER — Encounter: Payer: Self-pay | Admitting: Adult Health

## 2019-06-15 VITALS — BP 129/71 | HR 73 | Temp 98.2°F | Ht 65.0 in | Wt 163.6 lb

## 2019-06-15 DIAGNOSIS — I10 Essential (primary) hypertension: Secondary | ICD-10-CM

## 2019-06-15 DIAGNOSIS — D649 Anemia, unspecified: Secondary | ICD-10-CM | POA: Diagnosis not present

## 2019-06-15 DIAGNOSIS — E1140 Type 2 diabetes mellitus with diabetic neuropathy, unspecified: Secondary | ICD-10-CM | POA: Diagnosis not present

## 2019-06-15 DIAGNOSIS — Z0000 Encounter for general adult medical examination without abnormal findings: Secondary | ICD-10-CM | POA: Diagnosis not present

## 2019-06-15 DIAGNOSIS — E785 Hyperlipidemia, unspecified: Secondary | ICD-10-CM

## 2019-06-15 DIAGNOSIS — E119 Type 2 diabetes mellitus without complications: Secondary | ICD-10-CM

## 2019-06-15 LAB — POCT GLYCOSYLATED HEMOGLOBIN (HGB A1C): Hemoglobin A1C: 6.2 % — AB (ref 4.0–5.6)

## 2019-06-15 NOTE — Assessment & Plan Note (Signed)
Lab Results  Component Value Date   HGBA1C 6.2 (A) 06/15/2019   HGBA1C 6.4 (A) 10/20/2018   HGBA1C 7.0 (H) 07/02/2018  She reports AM BG 110-120s, denies episodes of hypoglycemia She is only taking Metformin 500mg  1/2 tab with dinner.

## 2019-06-15 NOTE — Assessment & Plan Note (Signed)
>>  ASSESSMENT AND PLAN FOR HYPERLIPIDEMIA WRITTEN ON 06/15/2019 11:10 AM BY DANFORD, KATY D, NP  LDL goal <70  She has been off Pravastatin 40mg  for weeks Lipid Panel drawn today

## 2019-06-15 NOTE — Assessment & Plan Note (Signed)
CBC- H/H remains low- pt denies hematuria/hematochezia- will provide Stool Cards to complete at home. She is unsure when her last colonoscopy was completed.

## 2019-06-15 NOTE — Progress Notes (Signed)
Subjective:    [name], female    DOB: [birth year], 78 y.o.   MRN: [id number]  HPI:  09/21/2018 OV:  [name] is here to establish as a new pt.  She is a pleasant 78 year old female. PMH:T2D, HTN, Morbid Obesity, HLD, CHB, Chronic back pain with L side sciatica She was on max daily dose of Gabapentin for chronic pain- suffered fall Oct 2019- has weaned off Gabapentin Now on Oxycodone 5mg  from [facility]  CHB treated with PPM- recently had device change out May 2019, follwed by Dr. Sallyanne Kuster.- last OV 09/08/2018- "she is to take losartan 50 mg daily and to stop irbesartan" She has not been on diabetic medication for years, per pt she was "diet controlled" She has been slowly losing weight since Fall 2019 by reducing portion sizes, current wt 187, she estimates to have lost >15 lbs since Oct 2019 She has been on supplemental O2 since Oct 2019- currently 2L She reports sats will fall into high 80s when off O2, she uses same flow rate when sleeping She was previously been treated by Dr. Wende Mott, last contact 2017 She denies current tobacco/vape/ETOH use She lives alone, however has local [name] who is quite involved in her care She ambulates with walker, denies any recent falls She reports being able to perform ADLs, IADLs and feels safe at home  Recent Labs       Lab Results  Component Value Date   HGBA1C 6.4 (A) 10/20/2018   HGBA1C 7.0 (H) 07/02/2018   HGBA1C 7.3 (H) 12/07/2014     10/20/2018 OV: [name] presents for f/u: T2D, HTN She has titrated up on Metformin- currently taking 1/2 500mg  tab with dinner Recent Labs       Lab Results  Component Value Date   HGBA1C 6.4 (A) 10/20/2018   HGBA1C 7.0 (H) 07/02/2018   HGBA1C 7.3 (H) 12/07/2014    She reports pain clinic stopped Oxycodone 5mg  and is now on Tramadol 50mg  QD She reports pain is not well controlled and her overall anxiety level is elevated. She denies thoughts of harming  herself/others  She was seen by Dr. Phillip Heal- 10/13/2018 Pt states that her breathing has improved some. She states she has only had to use her o2 a few times during the day. No new co's today.   walking at home is better but not like it was due to limiting sob But also back and knees limit her  Was able to do some house work but can't do sweeper s doe/ sats ok  Sleeping on 02 2lpm flat Taking gabapentin 300 x 2 = 6 per day / still frog in throat daytime but not as bad as while on ACEi  rec Try prilosec 20mg  Take 30-60 min before first meal of the day and Pepcid (famotidine) 20 mg one at bedtime until frog in throat is completely gone for at least a week  GERD diet  If breathing getting worse or cough worse return right away  [name] at [facility] during [place]   12/06/2018 OV: [name] calls in today for f/u on chronic pain-lumbar back pain with L side sciatica 10/20/2018-she was started onDuloxetine (Cymbalta) 20mg  once dailyto augment Tramadol 50mg  1-2 tabs QD She reports overall pain level is 7/10, described as "numbing pain". Prior to starting to Duloxetine she would rate 8/10-9/10 BMP 10/2018, GFR >60 She denies thoughts of harming herself/others, she reports "feeling good" She reports home BP  SBP 130-140s  DBP 70s She denies CP/increase in dyspnea/dizziness/HA/fever/palpitations She reports wearing home O2- 2L, every night during sleep, during day when she exerts herself She reports AM BG <140, she denies episodes of hypoglycemia, she denies GI upset  06/15/2019 OV:[name] is here for f/u:T2D, HTN, Morbid Obesity, HLD, CHB, Chronic back pain with L side sciatica  She was seen by Cardiology 05/20/2019- 2. HTN: BP 122/63 today - Will add low dose BBlocker as above - Continue losartan and lasix  3. CHB s/p PPM: last device interrogation 03/2019 showed frequent, sometimes sustained Atach with CHB but no true Afib - Will ask the device clinic to obtain a remote  transmission to evaluate for underlying arrhythmia that may be contributing to #1.   4. Mitral regurgitation: moderate on echo 05/13/2019 with evidence of tethered mitral valve leaflet. Possible this is functional in the setting of #1.  - Will continue to monitor with routine echos  5. Aortic insufficiency: mild to moderate on echo 05/13/2019.  - Will continue to monitor with routine echos.    START metoprolol succinate 12.5mg  daily F/u with cards in Dec 2020  Per cards LDL goal <70- this is also goal for diabetic  Recent Coronary CTA results- No significant coronary stenoses, with moderate plaque.   She reports AM BG 110-120s, denies episodes of hypoglycemia She is only taking Metformin 500mg  1/2 tab with dinner. She has dramatically reduced CHO/sugar intake and has lost 22 lbs since Feb 2020 Current wt 163 Body mass index is 27.22 kg/m.   She is unable to exercise due to supplemental 02 needs and chronic pain. CBC- H/H remains low- pt denies hematuria/hematochezia- will provide Stool Cards to complete at home. She is unsure when her last colonoscopy was completed.  She has f/u with Podiatry this week for foot ucler.  She has f/u with Pulmonology for COPD mgt  [name] at [facility] during [place]  Lab Results  Component Value Date   HGBA1C 6.2 (A) 06/15/2019   HGBA1C 6.4 (A) 10/20/2018   HGBA1C 7.0 (H) 07/02/2018   Patient Care Team    Relationship Specialty Notifications Start End  Esaw Grandchild, NP PCP - General Family Medicine  09/21/18   Sanda Klein, MD PCP - Cardiology Cardiology Admissions 09/08/18     Patient Active Problem List   Diagnosis Date Noted  . Low hemoglobin 06/15/2019  . Physical deconditioning 05/05/2019  . Healthcare maintenance 10/20/2018  . Other chronic pain 10/20/2018  . Rib fracture 07/02/2018  . Diabetes mellitus type 2, controlled, without complications ([place]) [id number]  . Acute respiratory failure with hypoxia ([place]) 07/01/2018  . Pacemaker  battery depletion 05/26/2018  . Mild obesity 01/05/2016  . Chronic respiratory failure with hypoxia ([place]) 10/29/2015  . Upper airway cough syndrome 03/04/2015  . COPD GOLD II 01/18/2015  . Pulmonary hypertension ([place])   . Dyspnea   . DOE (dyspnea on exertion)   . Sleep apnea   . Hypoxemia   . Acute respiratory failure with hypoxemia ([place])   . Acute pulmonary edema (H[facility])   . Chronic diastolic heart failure ([place]) 12/06/2014  . Accelerated hypertension with diastolic congestive heart failure, NYHA class 3 ([place]) 12/06/2014  . CHB (complete heart block) ([place]) 09/19/2013  . Pacemaker - dual chamber St Jude, 2009 09/19/2013  . Essential hypertension 09/19/2013  . Normal coronary arteries angiography 2002 09/19/2013  . Hyperlipidemia 09/19/2013  . Varicose veins of lower extremities with other complications [id number]     Past Medical History:  Diagnosis Date  .  Arthritis    "hands, spine" (12/07/2014)  . CHB (complete heart block) ([place]) 09/19/2013  . CHF (congestive heart failure) ([place]) dx'd 12/06/2014  . Chronic pain    "since neck OR in 2007"  . Coronary artery disease    normal coronaries by 09/10/00 cath  . Diabetes mellitus    hga1c 7.1 no meds  . Dysrhythmia   . HTN (hypertension) 09/19/2013  . Hypercholesterolemia   . Hyperlipidemia 09/19/2013  . Hypertension   . Myocardial infarction ([place]) dx'd 2009  . Neuropathy of both feet   . Pacemaker   . Paralysis of right hand ([place]) 2007   "after spinal cord injury/OR"  . Shortness of breath    exertional  . Type II diabetes mellitus ([place])    "had it years ago; lost weight after neck OR in 2007; glucose went way down; recently dx'd as returned recently" (12/07/2014)  . Urinary incontinence    "since 2007's OR"  . Venous insufficiency (chronic) (peripheral)      Past Surgical History:  Procedure Laterality Date  . ANTERIOR CERVICAL DECOMP/DISCECTOMY FUSION  2007  . BACK SURGERY    . CARDIAC CATHETERIZATION  09/10/2000   normal  Coronary arteries, EF60%,   . CARDIAC PACEMAKER PLACEMENT  12/01/2007   St.Jude Zephyr Elta [place] [id number], dual chamber  . CARDIOVASCULAR STRESS TEST  08/28/2000   Cardiolite perfusion study, EF58%, low risk study,   . DILATION AND CURETTAGE OF UTERUS  1960's  . KNEE ARTHROSCOPY Right   . LAPAROSCOPIC CHOLECYSTECTOMY  1995  . LEFT AND RIGHT HEART CATHETERIZATION WITH CORONARY ANGIOGRAM N/A 12/14/2014   Procedure: LEFT AND RIGHT HEART CATHETERIZATION WITH CORONARY ANGIOGRAM;  Surgeon: Peter M Martinique, MD;  Location: [facility] CATH LAB;  Service: Cardiovascular;  Laterality: N/A;  . Lower Ext. Dopplers  06/12/2011   no evidence of thrombus, all vessels normal in size, no evidence of insuffiency  . LUMBAR LAMINECTOMY/DECOMPRESSION MICRODISCECTOMY  03/02/2012   Procedure: LUMBAR LAMINECTOMY/DECOMPRESSION MICRODISCECTOMY 2 LEVELS;  Surgeon: Floyce Stakes, MD;  Location: [place] NEURO ORS;  Service: Neurosurgery;  Laterality: Bilateral;  Lumbar three, lumbar four, lumbar five Laminectomy/Foraminotomy  . NM MYOCAR PERF WALL MOTION  10/24/2008   protocol:Lexiscan, EF63%, normal study, scan negative for ischemia  . PPM GENERATOR CHANGEOUT N/A 05/26/2018   Procedure: PPM GENERATOR CHANGEOUT;  Surgeon: Sanda Klein, MD;  Location: [place] CV LAB;  Service: Cardiovascular;  Laterality: N/A;  . TRANSTHORACIC ECHOCARDIOGRAM  11/30/2007   normal echo, mean transaortic valve gradient 58mmHg     Family History  Problem Relation Age of Onset  . Diabetes Father   . Heart disease Father        before age 73  . Hyperlipidemia Father   . Hypertension Father   . Heart attack Father   . Congestive Heart Failure Father   . Diabetes Mother   . Heart disease Mother   . Hyperlipidemia Mother   . Hypertension Mother   . Congestive Heart Failure Mother   . Diabetes Brother   . Congestive Heart Failure Brother      Social History   Substance and Sexual Activity  Drug Use No     Social History   Substance and  Sexual Activity  Alcohol Use No     Social History   Tobacco Use  Smoking Status Former Smoker  . Packs/day: 0.50  . Years: 32.00  . Pack years: 16.00  . Types: Cigarettes  . Start date: 1963  . Quit date: 03/02/1994  .  Years since quitting: 25.3  Smokeless Tobacco Never Used     Outpatient Encounter Medications as of 06/15/2019  Medication Sig  . acetaminophen (TYLENOL) 500 MG tablet Take 500 mg by mouth 4 (four) times daily -  with meals and at bedtime.  Marland Kitchen albuterol (VENTOLIN HFA) 108 (90 Base) MCG/ACT inhaler Inhale 2 puffs into the lungs every 4 (four) hours as needed for wheezing or shortness of breath.  Marland Kitchen aspirin EC 81 MG tablet Take 81 mg by mouth daily.  . DULoxetine (CYMBALTA) 20 MG capsule Take 1 capsule (20 mg total) by mouth daily. OFFICE VISIT REQUIRED PRIOR TO ANY FURTHER REFILLS  . furosemide (LASIX) 40 MG tablet Take 1 tablet (40 mg total) by mouth daily. May take an extra tablet for weight gain of 2 lb in a day and 5 lbs in a weeks.  Marland Kitchen ibuprofen (ADVIL,MOTRIN) 200 MG tablet Take 200 mg by mouth every 6 (six) hours as needed.  Marland Kitchen losartan (COZAAR) 50 MG tablet Take 1 tablet (50 mg total) by mouth daily.  . meloxicam (MOBIC) 15 MG tablet Take 15 mg by mouth daily.  . metFORMIN (GLUCOPHAGE) 500 MG tablet 1/2 tablet nightly with dinner  . metoprolol succinate (TOPROL-XL) 25 MG 24 hr tablet Take 0.5 tablets (12.5 mg total) by mouth daily. Take with or immediately following a meal.  . OXYGEN 2lpm with sleep and exertion [employer]  . potassium chloride SA (KLOR-CON M20) 20 MEQ tablet Take 1 tablet (20 mEq total) by mouth daily.  . pravastatin (PRAVACHOL) 40 MG tablet Take 1 tablet (40 mg total) by mouth daily.  Marland Kitchen Respiratory Therapy Supplies (FLUTTER) DEVI Use flutter device three times a day as instructed  . Tiotropium Bromide-Olodaterol (STIOLTO RESPIMAT) 2.5-2.5 MCG/ACT AERS Inhale 2 puffs into the lungs daily.  . [DISCONTINUED] metoprolol tartrate (LOPRESSOR) 50 MG  tablet Take 1 tablet (50 mg total) by mouth once for 1 dose. To be taken 2 hours prior to your upcoming CT scan of your heart.   No facility-administered encounter medications on file as of 06/15/2019.     Allergies: Patient has no known allergies.  Body mass index is 27.22 kg/m.  Blood pressure 129/71, pulse 73, temperature 98.2 F (36.8 C), temperature source Oral, height 5\' 5"  (1.651 m), weight 163 lb 9.6 oz (74.2 kg), SpO2 97 %. PT on [facility] Review of Systems  Constitutional: Positive for fatigue. Negative for activity change, appetite change, chills, diaphoresis, fever and unexpected weight change.  HENT: Negative for congestion.   Respiratory: Positive for shortness of breath. Negative for cough, chest tightness, wheezing and stridor.   Cardiovascular: Negative for chest pain, palpitations and leg swelling.  Gastrointestinal: Negative for abdominal pain, constipation, diarrhea and nausea.  Endocrine: Negative for polydipsia, polyphagia and polyuria.  Genitourinary: Negative for hematuria.  Musculoskeletal: Positive for arthralgias, back pain, gait problem, joint swelling, myalgias, neck pain and neck stiffness.  Neurological: Negative for dizziness and headaches.  Hematological: Negative for adenopathy. Does not bruise/bleed easily.  Psychiatric/Behavioral: Negative for agitation, behavioral problems, confusion, decreased concentration, dysphoric mood, hallucinations, self-injury, sleep disturbance and suicidal ideas. The patient is not nervous/anxious and is not hyperactive.        Objective:   Physical Exam Vitals signs and nursing note reviewed.  Constitutional:      General: She is not in acute distress.    Appearance: Normal appearance. She is normal weight. She is not ill-appearing, toxic-appearing or diaphoretic.  Cardiovascular:     Rate and Rhythm: Normal rate  and regular rhythm.     Pulses: Normal pulses.     Heart sounds: Normal heart sounds. No murmur. No  friction rub. No gallop.   Pulmonary:     Effort: Pulmonary effort is normal. No respiratory distress.     Breath sounds: Normal breath sounds. No stridor. No wheezing, rhonchi or rales.     Comments: [facility] portable O2 at [facility] Chest:     Chest wall: No tenderness.  Musculoskeletal:     Right lower leg: No edema.     Left lower leg: No edema.  Skin:    Capillary Refill: Capillary refill takes less than 2 seconds.  Neurological:     Mental Status: She is alert and oriented to person, place, and time.  Psychiatric:        Mood and Affect: Mood normal.        Behavior: Behavior normal.        Thought Content: Thought content normal.        Judgment: Judgment normal.        Assessment & Plan:   1. Controlled type 2 diabetes mellitus without complication, without long-term current use of insulin ([place])   2. Low hemoglobin   3. Type 2 diabetes mellitus with diabetic neuropathy, without long-term current use of insulin ([place])   4. Healthcare maintenance   5. Hyperlipidemia, unspecified hyperlipidemia type   6. Essential hypertension     Healthcare maintenance A1c today is 6.2 Continue to check your blood glucose each morning and if consistently >140 or <100 or ever <80- call clinic. Continue all current medications as directed.  We will call you with lab results. Please complete stool cards and return to clinic. Continue follow-up with your various specialists. Continue to social distance and wear a mask when in public. Follow-up in 3 months.  Hyperlipidemia LDL goal <70  She has been off Pravastatin 40mg  for weeks Lipid Panel drawn today   Diabetes mellitus type 2, controlled, without complications ([place]) Lab Results  Component Value Date   HGBA1C 6.2 (A) 06/15/2019   HGBA1C 6.4 (A) 10/20/2018   HGBA1C 7.0 (H) 07/02/2018  She reports AM BG 110-120s, denies episodes of hypoglycemia She is only taking Metformin 500mg  1/2 tab with dinner.  Essential hypertension Followed  by cards - metoprolol succinate 12.5mg  daily added recently  - Continue losartan and lasix BP at goal today 129/71, HR 73  Low hemoglobin CBC- H/H remains low- pt denies hematuria/hematochezia- will provide Stool Cards to complete at home. She is unsure when her last colonoscopy was completed.    FOLLOW-UP:  Return in about 3 months (around 09/15/2019) for HTN, Hypercholestermia, Diabetes.

## 2019-06-15 NOTE — Patient Instructions (Addendum)
Diabetes Mellitus and Nutrition, Adult When you have diabetes (diabetes mellitus), it is very important to have healthy eating habits because your blood sugar (glucose) levels are greatly affected by what you eat and drink. Eating healthy foods in the appropriate amounts, at about the same times every day, can help you:  Control your blood glucose.  Lower your risk of heart disease.  Improve your blood pressure.  Reach or maintain a healthy weight. Every person with diabetes is different, and each person has different needs for a meal plan. Your health care provider may recommend that you work with a diet and nutrition specialist (dietitian) to make a meal plan that is best for you. Your meal plan may vary depending on factors such as:  The calories you need.  The medicines you take.  Your weight.  Your blood glucose, blood pressure, and cholesterol levels.  Your activity level.  Other health conditions you have, such as heart or kidney disease. How do carbohydrates affect me? Carbohydrates, also called carbs, affect your blood glucose level more than any other type of food. Eating carbs naturally raises the amount of glucose in your blood. Carb counting is a method for keeping track of how many carbs you eat. Counting carbs is important to keep your blood glucose at a healthy level, especially if you use insulin or take certain oral diabetes medicines. It is important to know how many carbs you can safely have in each meal. This is different for every person. Your dietitian can help you calculate how many carbs you should have at each meal and for each snack. Foods that contain carbs include:  Bread, cereal, rice, pasta, and crackers.  Potatoes and corn.  Peas, beans, and lentils.  Milk and yogurt.  Fruit and juice.  Desserts, such as cakes, cookies, ice cream, and candy. How does alcohol affect me? Alcohol can cause a sudden decrease in blood glucose (hypoglycemia),  especially if you use insulin or take certain oral diabetes medicines. Hypoglycemia can be a life-threatening condition. Symptoms of hypoglycemia (sleepiness, dizziness, and confusion) are similar to symptoms of having too much alcohol. If your health care provider says that alcohol is safe for you, follow these guidelines:  Limit alcohol intake to no more than 1 drink per day for nonpregnant women and 2 drinks per day for men. One drink equals 12 oz of beer, 5 oz of wine, or 1 oz of hard liquor.  Do not drink on an empty stomach.  Keep yourself hydrated with water, diet soda, or unsweetened iced tea.  Keep in mind that regular soda, juice, and other mixers may contain a lot of sugar and must be counted as carbs. What are tips for following this plan?  Reading food labels  Start by checking the serving size on the "Nutrition Facts" label of packaged foods and drinks. The amount of calories, carbs, fats, and other nutrients listed on the label is based on one serving of the item. Many items contain more than one serving per package.  Check the total grams (g) of carbs in one serving. You can calculate the number of servings of carbs in one serving by dividing the total carbs by 15. For example, if a food has 30 g of total carbs, it would be equal to 2 servings of carbs.  Check the number of grams (g) of saturated and trans fats in one serving. Choose foods that have low or no amount of these fats.  Check the number of   milligrams (mg) of salt (sodium) in one serving. Most people should limit total sodium intake to less than 2,300 mg per day.  Always check the nutrition information of foods labeled as "low-fat" or "nonfat". These foods may be higher in added sugar or refined carbs and should be avoided.  Talk to your dietitian to identify your daily goals for nutrients listed on the label. Shopping  Avoid buying canned, premade, or processed foods. These foods tend to be high in fat, sodium,  and added sugar.  Shop around the outside edge of the grocery store. This includes fresh fruits and vegetables, bulk grains, fresh meats, and fresh dairy. Cooking  Use low-heat cooking methods, such as baking, instead of high-heat cooking methods like deep frying.  Cook using healthy oils, such as olive, canola, or sunflower oil.  Avoid cooking with butter, cream, or high-fat meats. Meal planning  Eat meals and snacks regularly, preferably at the same times every day. Avoid going long periods of time without eating.  Eat foods high in fiber, such as fresh fruits, vegetables, beans, and whole grains. Talk to your dietitian about how many servings of carbs you can eat at each meal.  Eat 4-6 ounces (oz) of lean protein each day, such as lean meat, chicken, fish, eggs, or tofu. One oz of lean protein is equal to: ? 1 oz of meat, chicken, or fish. ? 1 egg. ?  cup of tofu.  Eat some foods each day that contain healthy fats, such as avocado, nuts, seeds, and fish. Lifestyle  Check your blood glucose regularly.  Exercise regularly as told by your health care provider. This may include: ? 150 minutes of moderate-intensity or vigorous-intensity exercise each week. This could be brisk walking, biking, or water aerobics. ? Stretching and doing strength exercises, such as yoga or weightlifting, at least 2 times a week.  Take medicines as told by your health care provider.  Do not use any products that contain nicotine or tobacco, such as cigarettes and e-cigarettes. If you need help quitting, ask your health care provider.  Work with a Social worker or diabetes educator to identify strategies to manage stress and any emotional and social challenges. Questions to ask a health care provider  Do I need to meet with a diabetes educator?  Do I need to meet with a dietitian?  What number can I call if I have questions?  When are the best times to check my blood glucose? Where to find more  information:  American Diabetes Association: diabetes.org  Academy of Nutrition and Dietetics: www.eatright.[employer] of Diabetes and Digestive and Kidney Diseases (NIH): [employer] Summary  A healthy meal plan will help you control your blood glucose and maintain a healthy lifestyle.  Working with a diet and nutrition specialist (dietitian) can help you make a meal plan that is best for you.  Keep in mind that carbohydrates (carbs) and alcohol have immediate effects on your blood glucose levels. It is important to count carbs and to use alcohol carefully. This information is not intended to replace advice given to you by your health care provider. Make sure you discuss any questions you have with your health care provider. Document Released: 05/15/2005 Document Revised: 07/31/2017 Document Reviewed: 09/22/2016 Elsevier Patient Education  2020 [place].  A1c today is 6.2 Continue to check your blood glucose each morning and if consistently >140 or <100 or ever <80- call clinic. Continue all current medications as directed.  We will call you with  lab results. Please complete stool cards and return to clinic. Continue follow-up with your various specialists. Continue to social distance and wear a mask when in public. Follow-up in 3 months. GREAT TO SEE YOU!

## 2019-06-15 NOTE — Assessment & Plan Note (Signed)
>>  ASSESSMENT AND PLAN FOR ESSENTIAL HYPERTENSION WRITTEN ON 06/15/2019 11:19 AM BY DANFORD, KATY D, NP  Followed by cards - metoprolol succinate 12.5mg  daily added recently  - Continue losartan and lasix BP at goal today 129/71, HR 73

## 2019-06-15 NOTE — Assessment & Plan Note (Signed)
A1c today is 6.2 Continue to check your blood glucose each morning and if consistently >140 or <100 or ever <80- call clinic. Continue all current medications as directed.  We will call you with lab results. Please complete stool cards and return to clinic. Continue follow-up with your various specialists. Continue to social distance and wear a mask when in public. Follow-up in 3 months.

## 2019-06-15 NOTE — Assessment & Plan Note (Signed)
Followed by cards - metoprolol succinate 12.5mg  daily added recently  - Continue losartan and lasix BP at goal today 129/71, HR 73

## 2019-06-15 NOTE — Assessment & Plan Note (Signed)
LDL goal <70  She has been off Pravastatin 40mg  for weeks Lipid Panel drawn today

## 2019-06-16 ENCOUNTER — Other Ambulatory Visit: Payer: Self-pay | Admitting: Adult Health

## 2019-06-16 ENCOUNTER — Telehealth: Payer: Self-pay | Admitting: Adult Health

## 2019-06-16 LAB — IRON,TIBC AND FERRITIN PANEL
Ferritin: 61 ng/mL (ref 15–150)
Iron Saturation: 17 % (ref 15–55)
Iron: 67 ug/dL (ref 27–139)
Total Iron Binding Capacity: 384 ug/dL (ref 250–450)
UIBC: 317 ug/dL (ref 118–369)

## 2019-06-16 LAB — CBC
Hematocrit: 37.1 % (ref 34.0–46.6)
Hemoglobin: 12.4 g/dL (ref 11.1–15.9)
MCH: 28.9 pg (ref 26.6–33.0)
MCHC: 33.4 g/dL (ref 31.5–35.7)
MCV: 87 fL (ref 79–97)
Platelets: 201 10*3/uL (ref 150–450)
RBC: 4.29 x10E6/uL (ref 3.77–5.28)
RDW: 13.8 % (ref 11.7–15.4)
WBC: 7 10*3/uL (ref 3.4–10.8)

## 2019-06-16 LAB — RETICULOCYTES: Retic Ct Pct: 0.6 % (ref 0.6–2.6)

## 2019-06-16 LAB — TRANSFERRIN: Transferrin: 322 mg/dL (ref 192–364)

## 2019-06-16 MED ORDER — LOSARTAN POTASSIUM 50 MG PO TABS: 50.0000 mg | ORAL_TABLET | Freq: Every day | ORAL | 1 refills | Status: DC

## 2019-06-16 NOTE — Addendum Note (Signed)
Addended by: Fonnie Mu on: 06/16/2019 10:29 AM   Modules accepted: Orders

## 2019-06-16 NOTE — Telephone Encounter (Signed)
[name], patient's daughter, is requesting a refill of her losartan. If approved please send to [facility] on [address].

## 2019-06-17 ENCOUNTER — Other Ambulatory Visit: Payer: Self-pay

## 2019-06-17 ENCOUNTER — Ambulatory Visit (INDEPENDENT_AMBULATORY_CARE_PROVIDER_SITE_OTHER): Payer: Medicare Other | Admitting: Podiatry

## 2019-06-17 ENCOUNTER — Encounter

## 2019-06-17 VITALS — Temp 97.3°F

## 2019-06-17 DIAGNOSIS — I442 Atrioventricular block, complete: Secondary | ICD-10-CM | POA: Diagnosis not present

## 2019-06-17 DIAGNOSIS — J9611 Chronic respiratory failure with hypoxia: Secondary | ICD-10-CM | POA: Diagnosis not present

## 2019-06-17 DIAGNOSIS — L97521 Non-pressure chronic ulcer of other part of left foot limited to breakdown of skin: Secondary | ICD-10-CM

## 2019-06-17 DIAGNOSIS — I5033 Acute on chronic diastolic (congestive) heart failure: Secondary | ICD-10-CM | POA: Diagnosis not present

## 2019-06-17 DIAGNOSIS — I2510 Atherosclerotic heart disease of native coronary artery without angina pectoris: Secondary | ICD-10-CM | POA: Diagnosis not present

## 2019-06-17 DIAGNOSIS — I110 Hypertensive heart disease with heart failure: Secondary | ICD-10-CM | POA: Diagnosis not present

## 2019-06-17 DIAGNOSIS — J441 Chronic obstructive pulmonary disease with (acute) exacerbation: Secondary | ICD-10-CM | POA: Diagnosis not present

## 2019-06-17 NOTE — Progress Notes (Signed)
Remote pacemaker transmission.   

## 2019-06-21 ENCOUNTER — Telehealth: Payer: Self-pay | Admitting: Adult Health

## 2019-06-21 DIAGNOSIS — J9611 Chronic respiratory failure with hypoxia: Secondary | ICD-10-CM | POA: Diagnosis not present

## 2019-06-21 DIAGNOSIS — I2510 Atherosclerotic heart disease of native coronary artery without angina pectoris: Secondary | ICD-10-CM | POA: Diagnosis not present

## 2019-06-21 DIAGNOSIS — J441 Chronic obstructive pulmonary disease with (acute) exacerbation: Secondary | ICD-10-CM | POA: Diagnosis not present

## 2019-06-21 DIAGNOSIS — I442 Atrioventricular block, complete: Secondary | ICD-10-CM | POA: Diagnosis not present

## 2019-06-21 DIAGNOSIS — I110 Hypertensive heart disease with heart failure: Secondary | ICD-10-CM | POA: Diagnosis not present

## 2019-06-21 DIAGNOSIS — I5033 Acute on chronic diastolic (congestive) heart failure: Secondary | ICD-10-CM | POA: Diagnosis not present

## 2019-06-21 NOTE — Telephone Encounter (Signed)
Patient's daughter/ Otila Kluver called to request lab results.  --Forwarding message to medical assistant.  --Dion Body

## 2019-06-22 ENCOUNTER — Other Ambulatory Visit: Payer: Self-pay

## 2019-06-22 ENCOUNTER — Other Ambulatory Visit (INDEPENDENT_AMBULATORY_CARE_PROVIDER_SITE_OTHER): Payer: Medicare Other

## 2019-06-22 DIAGNOSIS — Z1211 Encounter for screening for malignant neoplasm of colon: Secondary | ICD-10-CM

## 2019-06-22 DIAGNOSIS — R195 Other fecal abnormalities: Secondary | ICD-10-CM

## 2019-06-22 DIAGNOSIS — D649 Anemia, unspecified: Secondary | ICD-10-CM

## 2019-06-22 LAB — IFOBT (OCCULT BLOOD)
IFOBT: POSITIVE
IFOBT: POSITIVE
IFOBT: POSITIVE

## 2019-06-22 NOTE — Telephone Encounter (Signed)
Advised Tama High to inform pt's daughter that we are still waiting on one test result and we will call once we have all results back.  Charyl Bigger, CMA

## 2019-06-23 ENCOUNTER — Encounter: Payer: Self-pay | Admitting: Gastroenterology

## 2019-06-23 ENCOUNTER — Other Ambulatory Visit: Payer: Self-pay

## 2019-06-23 DIAGNOSIS — E119 Type 2 diabetes mellitus without complications: Secondary | ICD-10-CM

## 2019-06-23 DIAGNOSIS — E785 Hyperlipidemia, unspecified: Secondary | ICD-10-CM

## 2019-06-23 DIAGNOSIS — D649 Anemia, unspecified: Secondary | ICD-10-CM

## 2019-06-23 DIAGNOSIS — Z0000 Encounter for general adult medical examination without abnormal findings: Secondary | ICD-10-CM

## 2019-06-23 DIAGNOSIS — I10 Essential (primary) hypertension: Secondary | ICD-10-CM

## 2019-06-27 ENCOUNTER — Telehealth: Payer: Self-pay | Admitting: Adult Health

## 2019-06-27 DIAGNOSIS — J449 Chronic obstructive pulmonary disease, unspecified: Secondary | ICD-10-CM

## 2019-06-27 DIAGNOSIS — J9611 Chronic respiratory failure with hypoxia: Secondary | ICD-10-CM

## 2019-06-27 MED ORDER — STIOLTO RESPIMAT 2.5-2.5 MCG/ACT IN AERS: 2.0000 | INHALATION_SPRAY | Freq: Every day | RESPIRATORY_TRACT | 0 refills | Status: DC

## 2019-06-27 NOTE — Telephone Encounter (Signed)
Spoke with pt's daughter, Otila Kluver. Pt is requesting samples of Stiolto. Cleora Fleet of our office policy with samples. Otila Kluver states that the [employer] does not cover Stiolto. Stiolto is working well for the pt and she is completely out.  Tammy - please advise if we can provide pt with samples. Thanks.

## 2019-06-27 NOTE — Telephone Encounter (Signed)
Spoke with patient daughter Otila Kluver and advised samples will be at front foyer for pick up.  Nothing further needed at this time.

## 2019-06-27 NOTE — Telephone Encounter (Signed)
Can we start an appeal or patient assistance for her   Please contact office for sooner follow up if symptoms do not improve or worsen or seek emergency care   Leave sample out front if we have any please

## 2019-06-28 DIAGNOSIS — I442 Atrioventricular block, complete: Secondary | ICD-10-CM | POA: Diagnosis not present

## 2019-06-28 DIAGNOSIS — J441 Chronic obstructive pulmonary disease with (acute) exacerbation: Secondary | ICD-10-CM | POA: Diagnosis not present

## 2019-06-28 DIAGNOSIS — I110 Hypertensive heart disease with heart failure: Secondary | ICD-10-CM | POA: Diagnosis not present

## 2019-06-28 DIAGNOSIS — I2510 Atherosclerotic heart disease of native coronary artery without angina pectoris: Secondary | ICD-10-CM | POA: Diagnosis not present

## 2019-06-28 DIAGNOSIS — I5033 Acute on chronic diastolic (congestive) heart failure: Secondary | ICD-10-CM | POA: Diagnosis not present

## 2019-06-28 DIAGNOSIS — J9611 Chronic respiratory failure with hypoxia: Secondary | ICD-10-CM | POA: Diagnosis not present

## 2019-07-02 ENCOUNTER — Other Ambulatory Visit: Payer: Self-pay | Admitting: Adult Health

## 2019-07-04 ENCOUNTER — Encounter: Payer: Self-pay | Admitting: Gastroenterology

## 2019-07-04 ENCOUNTER — Ambulatory Visit (INDEPENDENT_AMBULATORY_CARE_PROVIDER_SITE_OTHER): Payer: Medicare Other | Admitting: Gastroenterology

## 2019-07-04 ENCOUNTER — Other Ambulatory Visit (INDEPENDENT_AMBULATORY_CARE_PROVIDER_SITE_OTHER): Payer: Medicare Other

## 2019-07-04 VITALS — BP 118/60 | HR 68 | Temp 98.2°F | Ht 65.0 in | Wt 162.3 lb

## 2019-07-04 DIAGNOSIS — K5909 Other constipation: Secondary | ICD-10-CM | POA: Diagnosis not present

## 2019-07-04 DIAGNOSIS — R195 Other fecal abnormalities: Secondary | ICD-10-CM | POA: Insufficient documentation

## 2019-07-04 DIAGNOSIS — D649 Anemia, unspecified: Secondary | ICD-10-CM | POA: Diagnosis present

## 2019-07-04 NOTE — Progress Notes (Addendum)
07/04/2019 [name] [id number] [birth year]   HISTORY OF PRESENT ILLNESS:  This is a 78 year old female with COPD and heart failure with a pacemaker and on oxygen who presents here with her daughter for evaluation of normocytic anemia with normal iron studies and Hemoccult positive stools x3.  Hemoglobin dipped down from normal to about 10.4 g back in September.  Most recently it was normal again at 12.4 g.  Iron studies are normal.  They report black stools, but she has been on a diet containing a lot of vegetables and greens as of late.  She admits to occasional very small amounts of bright red blood associated with hemorrhoid irritation resulting from issues with chronic constipation.  Only uses intermittent suppositories and stool softeners for her constipation, but they say that she really has struggled with it most of her life.  She has been on oxygen for about the past year.  Had some more issues with pulmonary congestion requiring some increased Lasix doses since September.  Also, they report that she has been taking NSAIDs quite regularly.   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   "after spinal cord injury/OR"   Shortness of breath    exertional   Type II diabetes mellitus ([place])    "had it years ago; lost weight after neck OR in 2007; glucose went way down; recently dx'd as returned recently" (12/07/2014)   Urinary incontinence    "since  2007's OR"   Venous insufficiency (chronic) (peripheral)    Past Surgical History:  Procedure Laterality Date   ANTERIOR CERVICAL DECOMP/DISCECTOMY FUSION  2007   BACK SURGERY     CARDIAC CATHETERIZATION  09/10/2000   normal Coronary arteries, EF60%,    CARDIAC PACEMAKER PLACEMENT  12/01/2007   [employer] Elta [place] [id number], dual chamber   CARDIOVASCULAR STRESS TEST  08/28/2000   Cardiolite perfusion study, EF58%, low risk study,    DILATION AND CURETTAGE OF UTERUS  1960's   KNEE ARTHROSCOPY Right    LAPAROSCOPIC CHOLECYSTECTOMY  1995   LEFT AND RIGHT HEART CATHETERIZATION WITH CORONARY ANGIOGRAM N/A 12/14/2014   Procedure: LEFT AND RIGHT HEART CATHETERIZATION WITH CORONARY ANGIOGRAM;  Surgeon: Peter M Martinique, MD;  Location: [facility] CATH LAB;  Service: Cardiovascular;  Laterality: N/A;   Lower Ext. Dopplers  06/12/2011   no evidence of thrombus, all vessels normal in size, no evidence of insuffiency   LUMBAR LAMINECTOMY/DECOMPRESSION MICRODISCECTOMY  03/02/2012   Procedure: LUMBAR LAMINECTOMY/DECOMPRESSION MICRODISCECTOMY 2 LEVELS;  Surgeon: Floyce Stakes, MD;  Location: [place] NEURO ORS;  Service: Neurosurgery;  Laterality: Bilateral;  Lumbar three, lumbar four, lumbar five Laminectomy/Foraminotomy   MOLE REMOVAL     left side of face near her eye   NM MYOCAR PERF WALL MOTION  10/24/2008   protocol:Lexiscan, EF63%, normal study, scan negative for ischemia   PPM GENERATOR CHANGEOUT N/A 05/26/2018  Procedure: PPM GENERATOR CHANGEOUT;  Surgeon: Sanda Klein, MD;  Location: [place] CV LAB;  Service: Cardiovascular;  Laterality: N/A;   TRANSTHORACIC ECHOCARDIOGRAM  11/30/2007   normal echo, mean transaortic valve gradient 62mmHg    reports that she quit smoking about 25 years ago. Her smoking use included cigarettes. She started smoking about 57 years ago. She has a 16.00 pack-year smoking history. She has never used smokeless tobacco. She reports that she does not drink  alcohol or use drugs. family history includes Congestive Heart Failure in her brother, father, and mother; Diabetes in her brother, father, and mother; Heart attack in her father; Heart disease in her father and mother; Hyperlipidemia in her father and mother; Hypertension in her father and mother. No Known Allergies    Outpatient Encounter Medications as of 07/04/2019  Medication Sig   acetaminophen (TYLENOL) 500 MG tablet Take 500 mg by mouth 4 (four) times daily -  with meals and at bedtime.   aspirin EC 81 MG tablet Take 81 mg by mouth daily.   DULoxetine (CYMBALTA) 20 MG capsule TAKE 1 CAPSULE (20 MG TOTAL) BY MOUTH DAILY. OFFICE VISIT REQUIRED PRIOR TO ANY FURTHER REFILLS   furosemide (LASIX) 40 MG tablet Take 1 tablet (40 mg total) by mouth daily. May take an extra tablet for weight gain of 2 lb in a day and 5 lbs in a weeks.   ibuprofen (ADVIL,MOTRIN) 200 MG tablet Take 200 mg by mouth every 6 (six) hours as needed.   losartan (COZAAR) 50 MG tablet Take 1 tablet (50 mg total) by mouth daily.   meloxicam (MOBIC) 15 MG tablet Take 15 mg by mouth daily.   metFORMIN (GLUCOPHAGE) 500 MG tablet 1/2 tablet nightly with dinner   metoprolol succinate (TOPROL-XL) 25 MG 24 hr tablet Take 0.5 tablets (12.5 mg total) by mouth daily. Take with or immediately following a meal.   OXYGEN 2lpm with sleep and exertion [employer]   potassium chloride SA (KLOR-CON M20) 20 MEQ tablet Take 1 tablet (20 mEq total) by mouth daily.   pravastatin (PRAVACHOL) 40 MG tablet TAKE 1 TABLET BY MOUTH EVERY DAY   Respiratory Therapy Supplies (FLUTTER) DEVI Use flutter device three times a day as instructed   Tiotropium Bromide-Olodaterol (STIOLTO RESPIMAT) 2.5-2.5 MCG/ACT AERS Inhale 2 puffs into the lungs daily.   Tiotropium Bromide-Olodaterol (STIOLTO RESPIMAT) 2.5-2.5 MCG/ACT AERS Inhale 2 puffs into the lungs daily.   [DISCONTINUED] albuterol (VENTOLIN HFA) 108 (90 Base) MCG/ACT inhaler Inhale 2 puffs  into the lungs every 4 (four) hours as needed for wheezing or shortness of breath.   [DISCONTINUED] pravastatin (PRAVACHOL) 40 MG tablet Take 1 tablet (40 mg total) by mouth daily.   No facility-administered encounter medications on file as of 07/04/2019.      REVIEW OF SYSTEMS  : All other systems reviewed and negative except where noted in the History of Present Illness.   PHYSICAL EXAM: BP 118/60    Pulse 68    Temp 98.2 F (36.8 C)    Ht 5\' 5"  (1.651 m)    Wt 162 lb 4.8 oz (73.6 kg)    BMI 27.01 kg/m  General: Well developed white female in no acute distress; in wheelchair and on O2 Head: Normocephalic and atraumatic Eyes:  Sclerae anicteric, conjunctiva pink. Ears: Normal auditory acuity Lungs: Clear throughout to auscultation; no increased WOB. Heart: Regular rate and rhythm; no M/R/G. Abdomen: Soft, non-distended.  BS present.  Non-tender. Musculoskeletal: Symmetrical with no gross deformities  Skin:  No lesions on visible extremities Extremities: No edema  Neurological: Alert oriented x 4, grossly non-focal Psychological:  Alert and cooperative. Normal mood and affect  ASSESSMENT AND PLAN: *78 year old female with COPD and heart failure with a pacemaker and on oxygen who presents here for evaluation of normocytic anemia with normal iron studies and Hemoccult positive stools x3.  Hemoglobin dipped down from normal to about 10.4 g back in September.  Most recently it was normal again at 12.4 g.  Once again, iron studies are normal.  They report black stools, but she has been on a diet containing a lot of vegetables and greens as of late.  She admits to occasional very small amounts of bright red blood associated with hemorrhoid irritation resulting from issues with chronic constipation.  The patient, her daughter, and I had extensive conversation regarding the options of putting her through both EGD and colonoscopy versus observation.  They are aware that unless we did the procedures  we would not know absolutely what caused these issues, but that theoretically the hemorrhoids could have caused the Hemoccult positive stools.  I think that if she were bleeding enough to be seeing black stools then her hemoglobin certainly would be trending down rather than normalizing.  At the end of her visit the patient and her daughter agreed and were comfortable with rechecking CBC today.  If hemoglobin remained normal/stable then they would like to continue with observation for now and would recommend PCP recheck CBC again in 1 month.  Certainly if hemoglobin continues to trend down then we may need to reconvene and reconsider colonoscopy and EGD with the understanding that she is high risk and would need the procedures done at [facility].  We are going to treat her constipation as well in hopes of preventing further hemorrhoid irritation.  Also, she has been taking NSAIDs in the form of Advil or Aleve quite regularly.  I have asked them to discontinue all anti-inflammatory type medications as these can cause irritation to the gastric and small bowel lining and induce bleeding from erosions, ulcers, etc.  This could also been a contributing issue. *Chronic constipation: Advised to start MiraLAX once daily, increase to twice daily if needed.  **45 minutes were spent with the patient, at least 50% of which used to discuss options for evaluation and treatment, etc.   CC:  Danford, Berna Spare, NP

## 2019-07-04 NOTE — Patient Instructions (Addendum)
If you are age 80 or older, your body mass index should be between 23-30. Your Body mass index is 27.01 kg/m. If this is out of the aforementioned range listed, please consider follow up with your Primary Care Provider.  If you are age 48 or younger, your body mass index should be between 19-25. Your Body mass index is 27.01 kg/m. If this is out of the aformentioned range listed, please consider follow up with your Primary Care Provider.   Your provider has requested that you go to the basement level for lab work before leaving today. Press "B" on the elevator. The lab is located at the first door on the left as you exit the elevator.  Miralax daily and increase to twice daily as needed.  Thank you for choosing me and [facility].   Alonza Bogus, PA-C

## 2019-07-05 ENCOUNTER — Other Ambulatory Visit: Payer: Self-pay

## 2019-07-05 DIAGNOSIS — D649 Anemia, unspecified: Secondary | ICD-10-CM

## 2019-07-05 LAB — CBC
HCT: 35.6 % — ABNORMAL LOW (ref 36.0–46.0)
Hemoglobin: 11.7 g/dL — ABNORMAL LOW (ref 12.0–15.0)
MCHC: 32.8 g/dL (ref 30.0–36.0)
MCV: 88.7 fl (ref 78.0–100.0)
Platelets: 177 10*3/uL (ref 150.0–400.0)
RBC: 4.02 Mil/uL (ref 3.87–5.11)
RDW: 14.7 % (ref 11.5–15.5)
WBC: 8.2 10*3/uL (ref 4.0–10.5)

## 2019-07-05 NOTE — Progress Notes (Signed)
I agree with this conservative approach given her age and multiple significant comorbid conditions.

## 2019-07-15 ENCOUNTER — Ambulatory Visit: Payer: BLUE CROSS/BLUE SHIELD | Admitting: Podiatry

## 2019-07-18 ENCOUNTER — Other Ambulatory Visit: Payer: Self-pay

## 2019-07-18 ENCOUNTER — Other Ambulatory Visit: Payer: Medicare Other

## 2019-07-18 DIAGNOSIS — E785 Hyperlipidemia, unspecified: Secondary | ICD-10-CM | POA: Diagnosis not present

## 2019-07-18 DIAGNOSIS — E119 Type 2 diabetes mellitus without complications: Secondary | ICD-10-CM | POA: Diagnosis not present

## 2019-07-18 DIAGNOSIS — R195 Other fecal abnormalities: Secondary | ICD-10-CM | POA: Diagnosis not present

## 2019-07-18 DIAGNOSIS — Z0000 Encounter for general adult medical examination without abnormal findings: Secondary | ICD-10-CM

## 2019-07-18 DIAGNOSIS — I10 Essential (primary) hypertension: Secondary | ICD-10-CM

## 2019-07-18 DIAGNOSIS — D649 Anemia, unspecified: Secondary | ICD-10-CM

## 2019-07-19 LAB — COMPREHENSIVE METABOLIC PANEL
ALT: 13 IU/L (ref 0–32)
AST: 22 IU/L (ref 0–40)
Albumin/Globulin Ratio: 2.7 — ABNORMAL HIGH (ref 1.2–2.2)
Albumin: 4.6 g/dL (ref 3.7–4.7)
Alkaline Phosphatase: 51 IU/L (ref 39–117)
BUN/Creatinine Ratio: 33 — ABNORMAL HIGH (ref 12–28)
BUN: 28 mg/dL — ABNORMAL HIGH (ref 8–27)
Bilirubin Total: 0.2 mg/dL (ref 0.0–1.2)
CO2: 25 mmol/L (ref 20–29)
Calcium: 9.3 mg/dL (ref 8.7–10.3)
Chloride: 98 mmol/L (ref 96–106)
Creatinine, Ser: 0.85 mg/dL (ref 0.57–1.00)
GFR calc Af Amer: 75 mL/min/{1.73_m2} (ref >59)
GFR calc non Af Amer: 65 mL/min/{1.73_m2} (ref >59)
Globulin, Total: 1.7 g/dL (ref 1.5–4.5)
Glucose: 121 mg/dL — ABNORMAL HIGH (ref 65–99)
Potassium: 4.8 mmol/L (ref 3.5–5.2)
Sodium: 136 mmol/L (ref 134–144)
Total Protein: 6.3 g/dL (ref 6.0–8.5)

## 2019-07-19 LAB — LDL CHOLESTEROL, DIRECT: LDL Direct: 76 mg/dL (ref 0–99)

## 2019-07-19 LAB — CBC WITH DIFFERENTIAL/PLATELET
Basophils Absolute: 0 10*3/uL (ref 0.0–0.2)
Basos: 0 %
EOS (ABSOLUTE): 0.2 10*3/uL (ref 0.0–0.4)
Eos: 3 %
Hematocrit: 35.7 % (ref 34.0–46.6)
Hemoglobin: 11.6 g/dL (ref 11.1–15.9)
Immature Grans (Abs): 0 10*3/uL (ref 0.0–0.1)
Immature Granulocytes: 0 %
Lymphocytes Absolute: 2.3 10*3/uL (ref 0.7–3.1)
Lymphs: 28 %
MCH: 29.2 pg (ref 26.6–33.0)
MCHC: 32.5 g/dL (ref 31.5–35.7)
MCV: 90 fL (ref 79–97)
Monocytes Absolute: 0.5 10*3/uL (ref 0.1–0.9)
Monocytes: 6 %
Neutrophils Absolute: 5.4 10*3/uL (ref 1.4–7.0)
Neutrophils: 63 %
Platelets: 186 10*3/uL (ref 150–450)
RBC: 3.97 x10E6/uL (ref 3.77–5.28)
RDW: 14.1 % (ref 11.7–15.4)
WBC: 8.5 10*3/uL (ref 3.4–10.8)

## 2019-07-19 LAB — LIPID PANEL
Chol/HDL Ratio: 2.5 ratio (ref 0.0–4.4)
Cholesterol, Total: 160 mg/dL (ref 100–199)
HDL: 63 mg/dL (ref >39)
LDL Chol Calc (NIH): 73 mg/dL (ref 0–99)
Triglycerides: 137 mg/dL (ref 0–149)
VLDL Cholesterol Cal: 24 mg/dL (ref 5–40)

## 2019-07-19 LAB — HEMOGLOBIN A1C
Est. average glucose Bld gHb Est-mCnc: 131 mg/dL
Hgb A1c MFr Bld: 6.2 % — ABNORMAL HIGH (ref 4.8–5.6)

## 2019-07-19 LAB — TSH: TSH: 2.05 u[IU]/mL (ref 0.450–4.500)

## 2019-07-28 ENCOUNTER — Encounter (HOSPITAL_COMMUNITY): Payer: Self-pay | Admitting: Emergency Medicine

## 2019-07-28 ENCOUNTER — Emergency Department (HOSPITAL_COMMUNITY): Payer: Medicare Other

## 2019-07-28 ENCOUNTER — Inpatient Hospital Stay (HOSPITAL_COMMUNITY)
Admission: EM | Admit: 2019-07-28 | Discharge: 2019-08-01 | DRG: 378 | Disposition: A | Discharge status: Home / Self Care | Payer: Medicare Other | Attending: Internal Medicine | Admitting: Internal Medicine

## 2019-07-28 ENCOUNTER — Other Ambulatory Visit: Payer: Self-pay

## 2019-07-28 DIAGNOSIS — K921 Melena: Secondary | ICD-10-CM | POA: Diagnosis not present

## 2019-07-28 DIAGNOSIS — Z20828 Contact with and (suspected) exposure to other viral communicable diseases: Secondary | ICD-10-CM | POA: Diagnosis present

## 2019-07-28 DIAGNOSIS — I442 Atrioventricular block, complete: Secondary | ICD-10-CM | POA: Diagnosis present

## 2019-07-28 DIAGNOSIS — D649 Anemia, unspecified: Secondary | ICD-10-CM | POA: Diagnosis present

## 2019-07-28 DIAGNOSIS — M19042 Primary osteoarthritis, left hand: Secondary | ICD-10-CM | POA: Diagnosis present

## 2019-07-28 DIAGNOSIS — Z7982 Long term (current) use of aspirin: Secondary | ICD-10-CM

## 2019-07-28 DIAGNOSIS — I272 Pulmonary hypertension, unspecified: Secondary | ICD-10-CM | POA: Diagnosis present

## 2019-07-28 DIAGNOSIS — R0602 Shortness of breath: Secondary | ICD-10-CM | POA: Diagnosis not present

## 2019-07-28 DIAGNOSIS — Z950 Presence of cardiac pacemaker: Secondary | ICD-10-CM

## 2019-07-28 DIAGNOSIS — R0902 Hypoxemia: Secondary | ICD-10-CM | POA: Diagnosis not present

## 2019-07-28 DIAGNOSIS — M19041 Primary osteoarthritis, right hand: Secondary | ICD-10-CM | POA: Diagnosis present

## 2019-07-28 DIAGNOSIS — Z833 Family history of diabetes mellitus: Secondary | ICD-10-CM

## 2019-07-28 DIAGNOSIS — K2970 Gastritis, unspecified, without bleeding: Secondary | ICD-10-CM | POA: Diagnosis not present

## 2019-07-28 DIAGNOSIS — N289 Disorder of kidney and ureter, unspecified: Secondary | ICD-10-CM | POA: Diagnosis present

## 2019-07-28 DIAGNOSIS — E1165 Type 2 diabetes mellitus with hyperglycemia: Secondary | ICD-10-CM | POA: Diagnosis not present

## 2019-07-28 DIAGNOSIS — Z7984 Long term (current) use of oral hypoglycemic drugs: Secondary | ICD-10-CM

## 2019-07-28 DIAGNOSIS — Z86010 Personal history of colonic polyps: Secondary | ICD-10-CM

## 2019-07-28 DIAGNOSIS — K264 Chronic or unspecified duodenal ulcer with hemorrhage: Secondary | ICD-10-CM | POA: Diagnosis not present

## 2019-07-28 DIAGNOSIS — I2510 Atherosclerotic heart disease of native coronary artery without angina pectoris: Secondary | ICD-10-CM | POA: Diagnosis present

## 2019-07-28 DIAGNOSIS — R42 Dizziness and giddiness: Secondary | ICD-10-CM | POA: Diagnosis not present

## 2019-07-28 DIAGNOSIS — K319 Disease of stomach and duodenum, unspecified: Secondary | ICD-10-CM | POA: Diagnosis not present

## 2019-07-28 DIAGNOSIS — L8990 Pressure ulcer of unspecified site, unspecified stage: Secondary | ICD-10-CM | POA: Diagnosis present

## 2019-07-28 DIAGNOSIS — Z9981 Dependence on supplemental oxygen: Secondary | ICD-10-CM

## 2019-07-28 DIAGNOSIS — G8929 Other chronic pain: Secondary | ICD-10-CM | POA: Diagnosis present

## 2019-07-28 DIAGNOSIS — I5042 Chronic combined systolic (congestive) and diastolic (congestive) heart failure: Secondary | ICD-10-CM | POA: Diagnosis present

## 2019-07-28 DIAGNOSIS — R55 Syncope and collapse: Secondary | ICD-10-CM | POA: Diagnosis not present

## 2019-07-28 DIAGNOSIS — D62 Acute posthemorrhagic anemia: Secondary | ICD-10-CM | POA: Diagnosis not present

## 2019-07-28 DIAGNOSIS — J449 Chronic obstructive pulmonary disease, unspecified: Secondary | ICD-10-CM | POA: Diagnosis present

## 2019-07-28 DIAGNOSIS — M479 Spondylosis, unspecified: Secondary | ICD-10-CM | POA: Diagnosis present

## 2019-07-28 DIAGNOSIS — M25569 Pain in unspecified knee: Secondary | ICD-10-CM | POA: Diagnosis present

## 2019-07-28 DIAGNOSIS — Z79899 Other long term (current) drug therapy: Secondary | ICD-10-CM

## 2019-07-28 DIAGNOSIS — I110 Hypertensive heart disease with heart failure: Secondary | ICD-10-CM | POA: Diagnosis present

## 2019-07-28 DIAGNOSIS — E1140 Type 2 diabetes mellitus with diabetic neuropathy, unspecified: Secondary | ICD-10-CM | POA: Diagnosis present

## 2019-07-28 DIAGNOSIS — I252 Old myocardial infarction: Secondary | ICD-10-CM

## 2019-07-28 DIAGNOSIS — J9611 Chronic respiratory failure with hypoxia: Secondary | ICD-10-CM | POA: Diagnosis present

## 2019-07-28 DIAGNOSIS — Z791 Long term (current) use of non-steroidal anti-inflammatories (NSAID): Secondary | ICD-10-CM

## 2019-07-28 DIAGNOSIS — Z87891 Personal history of nicotine dependence: Secondary | ICD-10-CM

## 2019-07-28 DIAGNOSIS — E785 Hyperlipidemia, unspecified: Secondary | ICD-10-CM | POA: Diagnosis present

## 2019-07-28 DIAGNOSIS — Z8349 Family history of other endocrine, nutritional and metabolic diseases: Secondary | ICD-10-CM

## 2019-07-28 DIAGNOSIS — G4730 Sleep apnea, unspecified: Secondary | ICD-10-CM | POA: Diagnosis present

## 2019-07-28 DIAGNOSIS — Z8249 Family history of ischemic heart disease and other diseases of the circulatory system: Secondary | ICD-10-CM

## 2019-07-28 DIAGNOSIS — K08109 Complete loss of teeth, unspecified cause, unspecified class: Secondary | ICD-10-CM | POA: Diagnosis present

## 2019-07-28 DIAGNOSIS — E119 Type 2 diabetes mellitus without complications: Secondary | ICD-10-CM

## 2019-07-28 DIAGNOSIS — G8191 Hemiplegia, unspecified affecting right dominant side: Secondary | ICD-10-CM | POA: Diagnosis present

## 2019-07-28 DIAGNOSIS — R231 Pallor: Secondary | ICD-10-CM | POA: Diagnosis not present

## 2019-07-28 LAB — COMPREHENSIVE METABOLIC PANEL
ALT: 14 U/L (ref 0–44)
AST: 19 U/L (ref 15–41)
Albumin: 3.5 g/dL (ref 3.5–5.0)
Alkaline Phosphatase: 36 U/L — ABNORMAL LOW (ref 38–126)
Anion gap: 14 (ref 5–15)
BUN: 63 mg/dL — ABNORMAL HIGH (ref 8–23)
CO2: 24 mmol/L (ref 22–32)
Calcium: 8.9 mg/dL (ref 8.9–10.3)
Chloride: 97 mmol/L — ABNORMAL LOW (ref 98–111)
Creatinine, Ser: 1.09 mg/dL — ABNORMAL HIGH (ref 0.44–1.00)
GFR calc Af Amer: 56 mL/min — ABNORMAL LOW (ref >60)
GFR calc non Af Amer: 48 mL/min — ABNORMAL LOW (ref >60)
Glucose, Bld: 179 mg/dL — ABNORMAL HIGH (ref 70–99)
Potassium: 4.2 mmol/L (ref 3.5–5.1)
Sodium: 135 mmol/L (ref 135–145)
Total Bilirubin: 0.5 mg/dL (ref 0.3–1.2)
Total Protein: 5.7 g/dL — ABNORMAL LOW (ref 6.5–8.1)

## 2019-07-28 LAB — CBC WITH DIFFERENTIAL/PLATELET
Abs Immature Granulocytes: 0.03 10*3/uL (ref 0.00–0.07)
Basophils Absolute: 0 10*3/uL (ref 0.0–0.1)
Basophils Relative: 0 %
Eosinophils Absolute: 0 10*3/uL (ref 0.0–0.5)
Eosinophils Relative: 0 %
HCT: 24.3 % — ABNORMAL LOW (ref 36.0–46.0)
Hemoglobin: 7.7 g/dL — ABNORMAL LOW (ref 12.0–15.0)
Immature Granulocytes: 0 %
Lymphocytes Relative: 15 %
Lymphs Abs: 1.8 10*3/uL (ref 0.7–4.0)
MCH: 30.1 pg (ref 26.0–34.0)
MCHC: 31.7 g/dL (ref 30.0–36.0)
MCV: 94.9 fL (ref 80.0–100.0)
Monocytes Absolute: 0.6 10*3/uL (ref 0.1–1.0)
Monocytes Relative: 6 %
Neutro Abs: 9.1 10*3/uL — ABNORMAL HIGH (ref 1.7–7.7)
Neutrophils Relative %: 79 %
Platelets: 201 10*3/uL (ref 150–400)
RBC: 2.56 MIL/uL — ABNORMAL LOW (ref 3.87–5.11)
RDW: 14.1 % (ref 11.5–15.5)
WBC: 11.6 10*3/uL — ABNORMAL HIGH (ref 4.0–10.5)
nRBC: 0 % (ref 0.0–0.2)

## 2019-07-28 LAB — TROPONIN I (HIGH SENSITIVITY): Troponin I (High Sensitivity): 9 ng/L (ref <18)

## 2019-07-28 LAB — PROTIME-INR
INR: 1 (ref 0.8–1.2)
Prothrombin Time: 13.5 seconds (ref 11.4–15.2)

## 2019-07-28 LAB — BRAIN NATRIURETIC PEPTIDE: B Natriuretic Peptide: 126.7 pg/mL — ABNORMAL HIGH (ref 0.0–100.0)

## 2019-07-28 MED ORDER — SODIUM CHLORIDE 0.9 % IV BOLUS
250.0000 mL | Freq: Once | INTRAVENOUS | Status: AC
  Administered 2019-07-29: 250 mL via INTRAVENOUS

## 2019-07-28 NOTE — ED Triage Notes (Signed)
Patient arrived with EMS from home reports emesis this evening after eating supper with fatigue/generalized weakness , mild SOB and lightheadedness , CBG= 275 .

## 2019-07-29 ENCOUNTER — Inpatient Hospital Stay (HOSPITAL_COMMUNITY): Payer: Medicare Other | Admitting: Certified Registered"

## 2019-07-29 ENCOUNTER — Encounter (HOSPITAL_COMMUNITY)
Admission: EM | Disposition: A | Discharge status: Home / Self Care | Payer: Self-pay | Source: Home / Self Care | Attending: Internal Medicine

## 2019-07-29 ENCOUNTER — Encounter (HOSPITAL_COMMUNITY): Payer: Self-pay | Admitting: Family Medicine

## 2019-07-29 ENCOUNTER — Other Ambulatory Visit: Payer: Self-pay

## 2019-07-29 DIAGNOSIS — K08109 Complete loss of teeth, unspecified cause, unspecified class: Secondary | ICD-10-CM | POA: Diagnosis present

## 2019-07-29 DIAGNOSIS — I272 Pulmonary hypertension, unspecified: Secondary | ICD-10-CM | POA: Diagnosis present

## 2019-07-29 DIAGNOSIS — E119 Type 2 diabetes mellitus without complications: Secondary | ICD-10-CM | POA: Diagnosis not present

## 2019-07-29 DIAGNOSIS — G8929 Other chronic pain: Secondary | ICD-10-CM | POA: Diagnosis present

## 2019-07-29 DIAGNOSIS — G4730 Sleep apnea, unspecified: Secondary | ICD-10-CM | POA: Diagnosis present

## 2019-07-29 DIAGNOSIS — I2510 Atherosclerotic heart disease of native coronary artery without angina pectoris: Secondary | ICD-10-CM | POA: Diagnosis not present

## 2019-07-29 DIAGNOSIS — N289 Disorder of kidney and ureter, unspecified: Secondary | ICD-10-CM | POA: Diagnosis present

## 2019-07-29 DIAGNOSIS — K921 Melena: Secondary | ICD-10-CM | POA: Diagnosis not present

## 2019-07-29 DIAGNOSIS — E1140 Type 2 diabetes mellitus with diabetic neuropathy, unspecified: Secondary | ICD-10-CM | POA: Diagnosis not present

## 2019-07-29 DIAGNOSIS — J449 Chronic obstructive pulmonary disease, unspecified: Secondary | ICD-10-CM | POA: Diagnosis not present

## 2019-07-29 DIAGNOSIS — R55 Syncope and collapse: Secondary | ICD-10-CM | POA: Insufficient documentation

## 2019-07-29 DIAGNOSIS — M19041 Primary osteoarthritis, right hand: Secondary | ICD-10-CM | POA: Diagnosis present

## 2019-07-29 DIAGNOSIS — Z20828 Contact with and (suspected) exposure to other viral communicable diseases: Secondary | ICD-10-CM | POA: Diagnosis not present

## 2019-07-29 DIAGNOSIS — I5042 Chronic combined systolic (congestive) and diastolic (congestive) heart failure: Secondary | ICD-10-CM

## 2019-07-29 DIAGNOSIS — I442 Atrioventricular block, complete: Secondary | ICD-10-CM | POA: Diagnosis not present

## 2019-07-29 DIAGNOSIS — M25569 Pain in unspecified knee: Secondary | ICD-10-CM | POA: Diagnosis present

## 2019-07-29 DIAGNOSIS — D649 Anemia, unspecified: Secondary | ICD-10-CM | POA: Diagnosis present

## 2019-07-29 DIAGNOSIS — K319 Disease of stomach and duodenum, unspecified: Secondary | ICD-10-CM | POA: Diagnosis not present

## 2019-07-29 DIAGNOSIS — L8990 Pressure ulcer of unspecified site, unspecified stage: Secondary | ICD-10-CM | POA: Insufficient documentation

## 2019-07-29 DIAGNOSIS — E785 Hyperlipidemia, unspecified: Secondary | ICD-10-CM | POA: Diagnosis present

## 2019-07-29 DIAGNOSIS — K3189 Other diseases of stomach and duodenum: Secondary | ICD-10-CM

## 2019-07-29 DIAGNOSIS — M19042 Primary osteoarthritis, left hand: Secondary | ICD-10-CM | POA: Diagnosis present

## 2019-07-29 DIAGNOSIS — D62 Acute posthemorrhagic anemia: Secondary | ICD-10-CM | POA: Diagnosis not present

## 2019-07-29 DIAGNOSIS — K2970 Gastritis, unspecified, without bleeding: Secondary | ICD-10-CM

## 2019-07-29 DIAGNOSIS — J9611 Chronic respiratory failure with hypoxia: Secondary | ICD-10-CM | POA: Diagnosis not present

## 2019-07-29 DIAGNOSIS — K269 Duodenal ulcer, unspecified as acute or chronic, without hemorrhage or perforation: Secondary | ICD-10-CM | POA: Diagnosis not present

## 2019-07-29 DIAGNOSIS — I110 Hypertensive heart disease with heart failure: Secondary | ICD-10-CM | POA: Diagnosis not present

## 2019-07-29 DIAGNOSIS — G8191 Hemiplegia, unspecified affecting right dominant side: Secondary | ICD-10-CM | POA: Diagnosis not present

## 2019-07-29 DIAGNOSIS — D509 Iron deficiency anemia, unspecified: Secondary | ICD-10-CM | POA: Diagnosis not present

## 2019-07-29 DIAGNOSIS — M479 Spondylosis, unspecified: Secondary | ICD-10-CM | POA: Diagnosis present

## 2019-07-29 DIAGNOSIS — K264 Chronic or unspecified duodenal ulcer with hemorrhage: Secondary | ICD-10-CM | POA: Diagnosis not present

## 2019-07-29 DIAGNOSIS — Z86010 Personal history of colonic polyps: Secondary | ICD-10-CM | POA: Diagnosis not present

## 2019-07-29 HISTORY — PX: ESOPHAGOGASTRODUODENOSCOPY: SHX5428

## 2019-07-29 HISTORY — PX: HOT HEMOSTASIS: SHX5433

## 2019-07-29 HISTORY — PX: BIOPSY: SHX5522

## 2019-07-29 LAB — BASIC METABOLIC PANEL
Anion gap: 10 (ref 5–15)
BUN: 55 mg/dL — ABNORMAL HIGH (ref 8–23)
CO2: 26 mmol/L (ref 22–32)
Calcium: 8.6 mg/dL — ABNORMAL LOW (ref 8.9–10.3)
Chloride: 99 mmol/L (ref 98–111)
Creatinine, Ser: 0.8 mg/dL (ref 0.44–1.00)
GFR calc Af Amer: >60 mL/min (ref >60)
GFR calc non Af Amer: >60 mL/min (ref >60)
Glucose, Bld: 123 mg/dL — ABNORMAL HIGH (ref 70–99)
Potassium: 4.1 mmol/L (ref 3.5–5.1)
Sodium: 135 mmol/L (ref 135–145)

## 2019-07-29 LAB — GLUCOSE, CAPILLARY
Glucose-Capillary: 119 mg/dL — ABNORMAL HIGH (ref 70–99)
Glucose-Capillary: 137 mg/dL — ABNORMAL HIGH (ref 70–99)
Glucose-Capillary: 128 mg/dL — ABNORMAL HIGH (ref 70–99)
Glucose-Capillary: 90 mg/dL (ref 70–99)
Glucose-Capillary: 109 mg/dL — ABNORMAL HIGH (ref 70–99)

## 2019-07-29 LAB — CBC
HCT: 23 % — ABNORMAL LOW (ref 36.0–46.0)
Hemoglobin: 7.8 g/dL — ABNORMAL LOW (ref 12.0–15.0)
MCH: 30.5 pg (ref 26.0–34.0)
MCHC: 33.9 g/dL (ref 30.0–36.0)
MCV: 89.8 fL (ref 80.0–100.0)
Platelets: 147 10*3/uL — ABNORMAL LOW (ref 150–400)
RBC: 2.56 MIL/uL — ABNORMAL LOW (ref 3.87–5.11)
RDW: 14.3 % (ref 11.5–15.5)
WBC: 8.2 10*3/uL (ref 4.0–10.5)
nRBC: 0 % (ref 0.0–0.2)

## 2019-07-29 LAB — TROPONIN I (HIGH SENSITIVITY): Troponin I (High Sensitivity): 11 ng/L (ref <18)

## 2019-07-29 LAB — SARS CORONAVIRUS 2 (TAT 6-24 HRS): SARS Coronavirus 2: NEGATIVE

## 2019-07-29 LAB — HEMOGLOBIN AND HEMATOCRIT, BLOOD
HCT: 22.4 % — ABNORMAL LOW (ref 36.0–46.0)
HCT: 22.5 % — ABNORMAL LOW (ref 36.0–46.0)
Hemoglobin: 7.3 g/dL — ABNORMAL LOW (ref 12.0–15.0)
Hemoglobin: 7.5 g/dL — ABNORMAL LOW (ref 12.0–15.0)

## 2019-07-29 LAB — HEMATOCRIT: HCT: 20.7 % — ABNORMAL LOW (ref 36.0–46.0)

## 2019-07-29 LAB — HEMOGLOBIN: Hemoglobin: 6.7 g/dL — CL (ref 12.0–15.0)

## 2019-07-29 LAB — PREPARE RBC (CROSSMATCH)

## 2019-07-29 SURGERY — EGD (ESOPHAGOGASTRODUODENOSCOPY)
Anesthesia: Monitor Anesthesia Care

## 2019-07-29 MED ORDER — BOOST / RESOURCE BREEZE PO LIQD CUSTOM
1.0000 | Freq: Three times a day (TID) | ORAL | Status: DC
  Administered 2019-07-29 – 2019-08-01 (×10): 1 via ORAL

## 2019-07-29 MED ORDER — ONDANSETRON HCL 4 MG PO TABS: 4.0000 mg | ORAL_TABLET | Freq: Four times a day (QID) | ORAL | Status: DC | PRN

## 2019-07-29 MED ORDER — SODIUM CHLORIDE 0.9% IV SOLUTION
Freq: Once | INTRAVENOUS | Status: AC
  Administered 2019-07-29: 07:00:00 via INTRAVENOUS

## 2019-07-29 MED ORDER — LACTATED RINGERS IV SOLN
INTRAVENOUS | Status: AC | PRN
  Administered 2019-07-29: 1000 mL via INTRAVENOUS

## 2019-07-29 MED ORDER — PHENYLEPHRINE HCL (PRESSORS) 10 MG/ML IV SOLN
INTRAVENOUS | Status: DC | PRN
  Administered 2019-07-29: 80 ug via INTRAVENOUS

## 2019-07-29 MED ORDER — PROPOFOL 500 MG/50ML IV EMUL
INTRAVENOUS | Status: DC | PRN
  Administered 2019-07-29: 150 ug/kg/min via INTRAVENOUS

## 2019-07-29 MED ORDER — LACTATED RINGERS IV SOLN
INTRAVENOUS | Status: DC | PRN
  Administered 2019-07-29: 15:00:00 via INTRAVENOUS

## 2019-07-29 MED ORDER — ARFORMOTEROL TARTRATE 15 MCG/2ML IN NEBU
15.0000 ug | INHALATION_SOLUTION | Freq: Two times a day (BID) | RESPIRATORY_TRACT | Status: DC
  Administered 2019-07-29 – 2019-08-01 (×7): 15 ug via RESPIRATORY_TRACT
  Filled 2019-07-29 (×7): qty 2

## 2019-07-29 MED ORDER — ORAL CARE MOUTH RINSE
15.0000 mL | Freq: Two times a day (BID) | OROMUCOSAL | Status: DC
  Administered 2019-07-29 – 2019-07-31 (×6): 15 mL via OROMUCOSAL

## 2019-07-29 MED ORDER — METOPROLOL SUCCINATE ER 25 MG PO TB24
12.5000 mg | ORAL_TABLET | Freq: Every day | ORAL | Status: DC
  Administered 2019-07-29 – 2019-08-01 (×4): 12.5 mg via ORAL
  Filled 2019-07-29 (×4): qty 1

## 2019-07-29 MED ORDER — FUROSEMIDE 40 MG PO TABS: 40.0000 mg | ORAL_TABLET | Freq: Every day | ORAL | Status: DC

## 2019-07-29 MED ORDER — ONDANSETRON HCL 4 MG/2ML IJ SOLN: 4.0000 mg | Freq: Four times a day (QID) | INTRAMUSCULAR | Status: DC | PRN

## 2019-07-29 MED ORDER — SODIUM CHLORIDE 0.9 % IV SOLN
8.0000 mg/h | INTRAVENOUS | Status: DC
  Administered 2019-07-29: 8 mg/h via INTRAVENOUS
  Filled 2019-07-29 (×3): qty 80

## 2019-07-29 MED ORDER — PROPOFOL 500 MG/50ML IV EMUL
INTRAVENOUS | Status: DC | PRN
  Administered 2019-07-29 (×2): 20 mg via INTRAVENOUS

## 2019-07-29 MED ORDER — PRAVASTATIN SODIUM 40 MG PO TABS
40.0000 mg | ORAL_TABLET | Freq: Every day | ORAL | Status: DC
  Administered 2019-07-29 – 2019-08-01 (×4): 40 mg via ORAL
  Filled 2019-07-29 (×4): qty 1

## 2019-07-29 MED ORDER — ACETAMINOPHEN 325 MG PO TABS
650.0000 mg | ORAL_TABLET | Freq: Four times a day (QID) | ORAL | Status: DC | PRN
  Administered 2019-07-30 – 2019-08-01 (×5): 650 mg via ORAL
  Filled 2019-07-29 (×5): qty 2

## 2019-07-29 MED ORDER — PANTOPRAZOLE SODIUM 40 MG IV SOLR: 40.0000 mg | Freq: Two times a day (BID) | INTRAVENOUS | Status: DC

## 2019-07-29 MED ORDER — SODIUM CHLORIDE 0.9 % IV SOLN
80.0000 mg | Freq: Once | INTRAVENOUS | Status: AC
  Administered 2019-07-29: 80 mg via INTRAVENOUS
  Filled 2019-07-29: qty 80

## 2019-07-29 MED ORDER — SODIUM CHLORIDE 0.9 % IV SOLN: 250.0000 mL | INTRAVENOUS | Status: DC | PRN

## 2019-07-29 MED ORDER — ACETAMINOPHEN 650 MG RE SUPP: 650.0000 mg | Freq: Four times a day (QID) | RECTAL | Status: DC | PRN

## 2019-07-29 MED ORDER — PANTOPRAZOLE SODIUM 40 MG IV SOLR
40.0000 mg | Freq: Once | INTRAVENOUS | Status: AC
  Administered 2019-07-29: 40 mg via INTRAVENOUS
  Filled 2019-07-29: qty 40

## 2019-07-29 MED ORDER — SODIUM CHLORIDE 0.9% FLUSH
3.0000 mL | Freq: Two times a day (BID) | INTRAVENOUS | Status: DC
  Administered 2019-07-29 – 2019-08-01 (×7): 3 mL via INTRAVENOUS

## 2019-07-29 MED ORDER — SODIUM CHLORIDE 0.9% FLUSH: 3.0000 mL | INTRAVENOUS | Status: DC | PRN

## 2019-07-29 MED ORDER — DULOXETINE HCL 20 MG PO CPEP
20.0000 mg | ORAL_CAPSULE | Freq: Every day | ORAL | Status: DC
  Administered 2019-07-29 – 2019-08-01 (×4): 20 mg via ORAL
  Filled 2019-07-29 (×5): qty 1

## 2019-07-29 MED ORDER — UMECLIDINIUM BROMIDE 62.5 MCG/INH IN AEPB
1.0000 | INHALATION_SPRAY | Freq: Every day | RESPIRATORY_TRACT | Status: DC
  Administered 2019-07-29 – 2019-08-01 (×4): 1 via RESPIRATORY_TRACT
  Filled 2019-07-29: qty 7

## 2019-07-29 MED ORDER — INSULIN ASPART 100 UNIT/ML ~~LOC~~ SOLN: 0.0000 [IU] | SUBCUTANEOUS | Status: DC

## 2019-07-29 MED ORDER — TRAZODONE HCL 50 MG PO TABS
50.0000 mg | ORAL_TABLET | Freq: Once | ORAL | Status: AC
  Administered 2019-07-29: 50 mg via ORAL
  Filled 2019-07-29: qty 1

## 2019-07-29 MED ORDER — SODIUM CHLORIDE 0.9% FLUSH
3.0000 mL | Freq: Two times a day (BID) | INTRAVENOUS | Status: DC
  Administered 2019-07-30 – 2019-08-01 (×3): 3 mL via INTRAVENOUS

## 2019-07-29 MED ORDER — PANTOPRAZOLE SODIUM 40 MG IV SOLR
40.0000 mg | Freq: Two times a day (BID) | INTRAVENOUS | Status: DC
  Administered 2019-07-29: 40 mg via INTRAVENOUS
  Filled 2019-07-29: qty 40

## 2019-07-29 NOTE — Progress Notes (Signed)
Initial Nutrition Assessment  DOCUMENTATION CODES:   Not applicable  INTERVENTION:   -RD will follow for diet advancement and supplement diet as appropriate  NUTRITION DIAGNOSIS:   Inadequate oral intake related to altered GI function as evidenced by NPO status.  GOAL:   Patient will meet greater than or equal to 90% of their needs  MONITOR:   Diet advancement, PO intake, Labs, Weight trends, Skin, I & O's  REASON FOR ASSESSMENT:   Malnutrition Screening Tool    ASSESSMENT:   [name] is a 78 y.o. female with medical history significant for COPD, chronic hypoxic respiratory failure, complete heart block with pacer, chronic combined systolic and diastolic CHF, type 2 diabetes mellitus, and recent dark stool with anemia, now presenting to the emergency department for evaluation of increased fatigue, generalized weakness, and near syncope.  Patient is accompanied by her daughter who assists with the history.  Patient was experiencing increased fatigue and generalized weakness today, went to her daughter's house for Thanksgiving dinner, continued to feel poorly, and developed nausea and presyncope.  She had been experiencing dark stools for couple months and had a drop in her hemoglobin from 12.4 down to 10.4 in September 2020.  She had fecal occult blood testing that was positive x3.  She was referred to gastroenterology, hemoglobin had improved, and given her comorbidity that poses a risk for endoscopy, she was being monitored.  She had been using NSAIDs daily for her chronic arthritic pains, but now limits her analgesics to Tylenol.  Pt admitted with upper GIB and symptomatic anemia.   Reviewed I/O's: +31 ml x 24 hours  UOP: 200 ml x 24 hours  Pt down in endoscopy suite at time of visit. Unable to obtain further nutrition-related history at this time.   Pt previously on a clear liquid diet, however, no documented intake. Pt now NPO for EGD.   Reviewed wt hx; noted pt has  experienced a 21.1% wt loss over the past year. This is significant for time frame and also concerning given advanced age.   Lab Results  Component Value Date   HGBA1C 6.2 (H) 07/18/2019   PTA DM medications are 250 mg metformin q HS.   Labs reviewed: CBGS: 119 (inpatient orders for glycemic control are 0-7 units insulin aspart every 4 hours).   Diet Order:   Diet Order            Diet clear liquid Room service appropriate? Yes; Fluid consistency: Thin  Diet effective now        Diet NPO time specified  Diet effective now              EDUCATION NEEDS:   No education needs have been identified at this time  Skin:  Skin Assessment: Skin Integrity Issues: Skin Integrity Issues:: Stage I Stage I: lt buttocks  Last BM:  07/28/19  Height:   Ht Readings from Last 1 Encounters:  07/29/19 5\' 7"  (1.702 m)    Weight:   Wt Readings from Last 1 Encounters:  07/29/19 73.8 kg    Ideal Body Weight:  61.4 kg  BMI:  Body mass index is 25.48 kg/m.  Estimated Nutritional Needs:   Kcal:  1650-1850  Protein:  80-95 grams  Fluid:  > 1.6 L    Merik Mignano A. Jimmye Norman, RD, LDN, [place] Registered Dietitian II Certified Diabetes Care and Education Specialist Pager: [phone] After hours Pager: [phone]

## 2019-07-29 NOTE — Consult Note (Addendum)
Referring Provider:  [facility]         Primary Care Physician:  Esaw Grandchild, NP Primary Gastroenterologist:  Owens Loffler, MD           Reason for Consultation:   GI bleed           ASSESSMENT /  PLAN   78 year old female with PMH significant for COPD on home O2, heart failure, pacemaker normocytic anemia with Hemoccult positive, dark stool.   71.  78 year old female admitted with heme positive dark stools / symptomatic anemia. Suspect upper GI bleeding. Significant NSAID use up until ~ one month ago. Rule out erosive disease, PUD. Some minor rectal bleeding at times but suspect separate issue and related to hemorrhoids.  -Patient will be scheduled for EGD. The risks and benefits of EGD were discussed and the patient agrees to proceed.  -Continue bid PPI  2. COPD, chronic hypoxic respiratory failure on oxygen at home  3. Chronic combined systolic and diastolic heart failure.  EF 45 to 50% September 2020.  Severe LAE  4. DM2.  A1c was 6.2% in November.  Metformin on hold  5. History of adenomatous colon polyps 2012. Given age and co-morbidities she is a not good candidate for future surveillance colonoscopies    Attending Physician Note   I have taken a history, examined the patient and reviewed the chart. I agree with the Advanced Practitioner's note, impression and recommendations.  Dark, heme + stool, worsening anemia, history of NSAID usage - R/O ulcer, gastritis  COPD, chronic respiratory failure on home O2  IV PPI bid Avoid NSAIDs EGD today Transfusions to maintain Hgb > 7-8   Lucio Edward, MD [facility]    HPI:     [name] is a 78 y.o. female with PMH as outlined below.  We saw her in the office on the second day of this month for evaluation of normocytic anemia with Hemoccult positive stool x3.  Iron studies were normal.  Patient reported black stools.  She admitted to very small amounts of bright red blood associated with  hemorrhoids.  Patient at high risk for procedures so after extensive discussion with the family, we agreed to just follow CBC, discontinue NSAIDs, treat constipation to avoid hemorrhoidal flares.  Her repeat hemoglobin was 11.7, down from 12.4 mid October of this year.  We asked the patient to return for repeat labs in a couple weeks which she did.  Hemoglobin was stable at 11.7 on the 16th of this month.   Sometime between the 16th of this month and yesterday patient's hemoglobin declined from 11.6 to 7.7.  She presented to the ED around midnight with complaints of dizziness/weakness.  Admitted now with symptomatic anemia   **2012 Screening colonoscopy ([facility]), diverticulosis, small rectal polyp (tubular adenoma)    Past Medical History:  Diagnosis Date   Arthritis    "hands, spine" (12/07/2014)   CHB (complete heart block) ([place]) 09/19/2013   CHF (congestive heart failure) ([place]) dx'd 12/06/2014   Chronic pain    "since neck OR in 2007"   Coronary artery disease    normal coronaries by 09/10/00 cath   Diabetes mellitus    hga1c 7.1 no meds   Dysrhythmia    HTN (hypertension) 09/19/2013   Hypercholesterolemia    Hyperlipidemia 09/19/2013   Hypertension    Myocardial infarction [facility]) dx'd 2009   Neuropathy of both feet    Pacemaker    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   "after spinal cord injury/OR"   Shortness of breath    exertional   Type II diabetes mellitus ([place])    "had it years ago; lost weight after neck OR in 2007; glucose went way down; recently dx'd as returned recently" (12/07/2014)   Urinary incontinence    "since 2007's OR"   Venous insufficiency (chronic) (peripheral)     Past Surgical History:  Procedure Laterality Date   ANTERIOR CERVICAL DECOMP/DISCECTOMY FUSION  2007   BACK SURGERY     CARDIAC CATHETERIZATION  09/10/2000   normal Coronary arteries, EF60%,    CARDIAC PACEMAKER PLACEMENT  12/01/2007   [employer] Elta [place] [id number], dual  chamber   CARDIOVASCULAR STRESS TEST  08/28/2000   Cardiolite perfusion study, EF58%, low risk study,    DILATION AND CURETTAGE OF UTERUS  1960's   KNEE ARTHROSCOPY Right    LAPAROSCOPIC CHOLECYSTECTOMY  1995   LEFT AND RIGHT HEART CATHETERIZATION WITH CORONARY ANGIOGRAM N/A 12/14/2014   Procedure: LEFT AND RIGHT HEART CATHETERIZATION WITH CORONARY ANGIOGRAM;  Surgeon: Peter M Martinique, MD;  Location: [facility] CATH LAB;  Service: Cardiovascular;  Laterality: N/A;   Lower Ext. Dopplers  06/12/2011   no evidence of thrombus, all vessels normal in size, no evidence of insuffiency   LUMBAR LAMINECTOMY/DECOMPRESSION MICRODISCECTOMY  03/02/2012   Procedure: LUMBAR LAMINECTOMY/DECOMPRESSION MICRODISCECTOMY 2 LEVELS;  Surgeon: Floyce Stakes, MD;  Location: [facility] NEURO ORS;  Service: Neurosurgery;  Laterality: Bilateral;  Lumbar three, lumbar four, lumbar five Laminectomy/Foraminotomy   MOLE REMOVAL     left side of face near her eye   NM MYOCAR PERF WALL MOTION  10/24/2008   protocol:Lexiscan, EF63%, normal study, scan negative for ischemia   PPM GENERATOR CHANGEOUT N/A 05/26/2018   Procedure: PPM GENERATOR CHANGEOUT;  Surgeon: Sanda Klein, MD;  Location: [facility] CV LAB;  Service: Cardiovascular;  Laterality: N/A;   TRANSTHORACIC ECHOCARDIOGRAM  11/30/2007   normal echo, mean transaortic valve gradient 46mmHg    Prior to Admission medications   Medication Sig Start Date End Date Taking? Authorizing Provider  acetaminophen (TYLENOL) 500 MG tablet Take 500 mg by mouth every 6 (six) hours as needed for mild pain.    Yes [provider]  aspirin EC 81 MG tablet Take 81 mg by mouth daily.   Yes [provider]  DULoxetine (CYMBALTA) 20 MG capsule TAKE 1 CAPSULE (20 MG TOTAL) BY MOUTH DAILY. OFFICE VISIT REQUIRED PRIOR TO ANY FURTHER REFILLS Patient taking differently: Take 20 mg by mouth daily.  06/16/19  Yes Danford, Valetta Fuller D, NP  furosemide (LASIX) 40 MG tablet Take 1 tablet  (40 mg total) by mouth daily. May take an extra tablet for weight gain of 2 lb in a day and 5 lbs in a weeks. 05/20/19  Yes Kroeger, Daleen Snook M., PA-C  losartan (COZAAR) 50 MG tablet Take 1 tablet (50 mg total) by mouth daily. 06/16/19  Yes Danford, Valetta Fuller D, NP  meloxicam (MOBIC) 15 MG tablet Take 15 mg by mouth at bedtime.   Yes [provider]  metFORMIN (GLUCOPHAGE) 500 MG tablet 1/2 tablet nightly with dinner Patient taking differently: Take 250 mg by mouth every morning.  10/20/18  Yes Danford, Valetta Fuller D, NP  metoprolol succinate (TOPROL-XL) 25 MG 24 hr tablet Take 0.5 tablets (12.5 mg total) by mouth daily. Take with or immediately following a meal. 05/20/19  Yes Kroeger, Lorelee Cover., PA-C  Nutritional Supplements (JUICE PLUS FIBRE PO) Take 3 tablets by mouth at bedtime.  Yes [provider]  OXYGEN 2lpm with sleep and exertion [employer]   Yes [provider]  potassium chloride SA (KLOR-CON M20) 20 MEQ tablet Take 1 tablet (20 mEq total) by mouth daily. 05/20/19  Yes Kroeger, Daleen Snook M., PA-C  pravastatin (PRAVACHOL) 40 MG tablet TAKE 1 TABLET BY MOUTH EVERY DAY Patient taking differently: Take 40 mg by mouth daily.  07/04/19  Yes Danford, Valetta Fuller D, NP  Tiotropium Bromide-Olodaterol (STIOLTO RESPIMAT) 2.5-2.5 MCG/ACT AERS Inhale 2 puffs into the lungs daily. Patient taking differently: Inhale 2 puffs into the lungs daily with lunch.  05/06/19  Yes Parrett, Fonnie Mu, NP  Respiratory Therapy Supplies (FLUTTER) DEVI Use flutter device three times a day as instructed 05/13/19   Parrett, Fonnie Mu, NP    Current Facility-Administered Medications  Medication Dose Route Frequency Provider Last Rate Last Dose   0.9 %  sodium chloride infusion  250 mL Intravenous PRN Opyd, Ilene Qua, MD       acetaminophen (TYLENOL) tablet 650 mg  650 mg Oral Q6H PRN Opyd, Ilene Qua, MD       Or   acetaminophen (TYLENOL) suppository 650 mg  650 mg Rectal Q6H PRN Opyd, Ilene Qua, MD       arformoterol  (BROVANA) nebulizer solution 15 mcg  15 mcg Nebulization BID Opyd, Ilene Qua, MD   15 mcg at 07/29/19 [id number]   DULoxetine (CYMBALTA) DR capsule 20 mg  20 mg Oral Daily Alma Friendly, MD       feeding supplement (BOOST / RESOURCE BREEZE) liquid 1 Container  1 Container Oral TID BM Opyd, Ilene Qua, MD   1 Container at 07/29/19 1322   insulin aspart (novoLOG) injection 0-7 Units  0-7 Units Subcutaneous Q4H Opyd, Ilene Qua, MD       MEDLINE mouth rinse  15 mL Mouth Rinse BID Opyd, Ilene Qua, MD   15 mL at 07/29/19 [id number]   metoprolol succinate (TOPROL-XL) 24 hr tablet 12.5 mg  12.5 mg Oral Daily Alma Friendly, MD   12.5 mg at 07/29/19 1317   ondansetron (ZOFRAN) tablet 4 mg  4 mg Oral Q6H PRN Opyd, Ilene Qua, MD       Or   ondansetron (ZOFRAN) injection 4 mg  4 mg Intravenous Q6H PRN Opyd, Ilene Qua, MD       pantoprazole (PROTONIX) injection 40 mg  40 mg Intravenous Q12H Opyd, Ilene Qua, MD   40 mg at 07/29/19 1219   pravastatin (PRAVACHOL) tablet 40 mg  40 mg Oral Daily Alma Friendly, MD   40 mg at 07/29/19 1317   sodium chloride flush (NS) 0.9 % injection 3 mL  3 mL Intravenous Q12H Opyd, Ilene Qua, MD       sodium chloride flush (NS) 0.9 % injection 3 mL  3 mL Intravenous Q12H Opyd, Ilene Qua, MD   3 mL at 07/29/19 [id number]   sodium chloride flush (NS) 0.9 % injection 3 mL  3 mL Intravenous PRN Opyd, Ilene Qua, MD       umeclidinium bromide (INCRUSE ELLIPTA) 62.5 MCG/INH 1 puff  1 puff Inhalation Daily Opyd, Ilene Qua, MD   1 puff at 07/29/19 [id number]    Allergies as of 07/28/2019   (No Known Allergies)    Family History  Problem Relation Age of Onset   Diabetes Father    Heart disease Father        before age 43   Hyperlipidemia Father    Hypertension Father  Heart attack Father    Congestive Heart Failure Father    Diabetes Mother    Heart disease Mother    Hyperlipidemia Mother    Hypertension Mother    Congestive Heart Failure Mother     Diabetes Brother    Congestive Heart Failure Brother    Colon cancer Neg Hx    Esophageal cancer Neg Hx    Rectal cancer Neg Hx     Social History   Socioeconomic History   Marital status: Widowed    Spouse name: Not on file   Number of children: 2   Years of education: Not on file   Highest education level: Not on file  Occupational History   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, product/process development strain: Not on file   Food insecurity    Worry: Not on file    Inability: Not on Lexicographer needs    Medical: Not on file    Non-medical: Not on file  Tobacco Use   Smoking status: Former Smoker    Packs/day: 0.50    Years: 32.00    Pack years: 16.00    Types: Cigarettes    Start date: 1963    Quit date: 03/02/1994    Years since quitting: 25.4   Smokeless tobacco: Never Used  Substance and Sexual Activity   Alcohol use: No   Drug use: No   Sexual activity: Not Currently    Birth control/protection: None  Lifestyle   Physical activity    Days per week: Not on file    Minutes per session: Not on file   Stress: Not on file  Relationships   Social connections    Talks on phone: Not on file    Gets together: Not on file    Attends religious service: Not on file    Active member of club or organization: Not on file    Attends meetings of clubs or organizations: Not on file    Relationship status: Not on file   Intimate partner violence    Fear of current or ex partner: Not on file    Emotionally abused: Not on file    Physically abused: Not on file    Forced sexual activity: Not on file  Other Topics Concern   Not on file  Social History Narrative   Not on file    Review of Systems: All systems reviewed and negative except where noted in HPI.  Physical Exam: Vital signs in last 24 hours: Temp:  [98 F (36.7 C)-99.1 F (37.3 C)] 98.8 F (37.1 C) (11/27 1121) Pulse Rate:  [69-85] 72 (11/27  1121) Resp:  [17-22] 17 (11/27 1121) BP: (115-142)/(34-60) 142/48 (11/27 1121) SpO2:  [100 %] 100 % (11/27 1121) Weight:  [73.8 kg] 73.8 kg (11/27 0800) Last BM Date: 07/28/19 General:   Alert, well-developed,  female in NAD Psych:  Pleasant, cooperative. Normal mood and affect. Eyes:  Pupils equal, sclera clear, no icterus.   Conjunctiva pink. Ears:  Normal auditory acuity. Nose:  No deformity, discharge,  or lesions. Neck:  Supple; no masses Lungs:  Clear throughout to auscultation.   No wheezes, crackles, or rhonchi.  Heart:  Regular rate and rhythm; + murmurs, no lower extremity edema Abdomen:  Soft, non-distended, nontender, BS active, no palp mass   Rectal:  Deferred  Msk:  Symmetrical without gross deformities. . Neurologic:  Alert and  oriented x4;  grossly normal neurologically. Skin:  Intact  without significant lesions or rashes.   Intake/Output from previous day: 11/26 0701 - 11/27 0700 In: 231.4 [IV Piggyback:231.4] Out: 200 [Urine:200] Intake/Output this shift: Total I/O In: 1142 [P.O.:840; Blood:302] Out: 900 [Urine:900]  Lab Results: Recent Labs    07/28/19 2151 07/28/19 2354 07/29/19 0515 07/29/19 1234  WBC 11.6*  --   --   --   HGB 7.7* 7.3* 6.7* 7.5*  HCT 24.3* 22.5* 20.7* 22.4*  PLT 201  --   --   --    BMET Recent Labs    07/28/19 2151 07/29/19 0515  NA 135 135  K 4.2 4.1  CL 97* 99  CO2 24 26  GLUCOSE 179* 123*  BUN 63* 55*  CREATININE 1.09* 0.80  CALCIUM 8.9 8.6*   LFT Recent Labs    07/28/19 2151  PROT 5.7*  ALBUMIN 3.5  AST 19  ALT 14  ALKPHOS 36*  BILITOT 0.5   PT/INR Recent Labs    07/28/19 2151  LABPROT 13.5  INR 1.0   Hepatitis Panel No results for input(s): HEPBSAG, HCVAB, HEPAIGM, HEPBIGM in the last 72 hours.   . CBC Latest Ref Rng & Units 07/29/2019 07/29/2019 07/28/2019  WBC 4.0 - 10.5 K/uL - - -  Hemoglobin 12.0 - 15.0 g/dL 7.5(L) 6.7(LL) 7.3(L)  Hematocrit 36.0 - 46.0 % 22.4(L) 20.7(L) 22.5(L)   Platelets 150 - 400 K/uL - - -    . CMP Latest Ref Rng & Units 07/29/2019 07/28/2019 07/18/2019  Glucose 70 - 99 mg/dL 123(H) 179(H) 121(H)  BUN 8 - 23 mg/dL 55(H) 63(H) 28(H)  Creatinine 0.44 - 1.00 mg/dL 0.80 1.09(H) 0.85  Sodium 135 - 145 mmol/L 135 135 136  Potassium 3.5 - 5.1 mmol/L 4.1 4.2 4.8  Chloride 98 - 111 mmol/L 99 97(L) 98  CO2 22 - 32 mmol/L 26 24 25   Calcium 8.9 - 10.3 mg/dL 8.6(L) 8.9 9.3  Total Protein 6.5 - 8.1 g/dL - 5.7(L) 6.3  Total Bilirubin 0.3 - 1.2 mg/dL - 0.5 0.2  Alkaline Phos 38 - 126 U/L - 36(L) 51  AST 15 - 41 U/L - 19 22  ALT 0 - 44 U/L - 14 13   Studies/Results: Dg Chest 2 View  Result Date: 07/28/2019 CLINICAL DATA:  Shortness of breath EXAM: CHEST - 2 VIEW COMPARISON:  Cardiac CT 06/03/2019, radiograph 05/13/2019 FINDINGS: Chronically coarsened interstitial opacities are similar to prior. No consolidation, features of edema, pneumothorax, or effusion. The aorta is calcified. The remaining cardiomediastinal contours are unremarkable. Pacer pack overlies the left chest wall with leads at the cardiac apex and right atrium. Cervical fusion hardware is noted. Degenerative changes are present in the imaged spine and shoulders. Dextrocurvature of the spine is similar to prior. IMPRESSION: 1. Stable chronic interstitial changes. No superimposed acute cardiopulmonary process. 2.  Aortic Atherosclerosis (ICD10-I70.0). Electronically Signed   By: Lovena Le M.D.   On: 07/28/2019 22:07    Principal Problem:   Symptomatic anemia Active Problems:   Chronic combined systolic and diastolic CHF (congestive heart failure) (HCC)   COPD GOLD II   Diabetes mellitus type 2, controlled, without complications ([place])   Mild renal insufficiency   Pressure injury of skin    Tye Savoy, NP-C @  07/29/2019, 1:41 PM

## 2019-07-29 NOTE — Progress Notes (Signed)
Pt's daughter was requesting to have an exception to visiting hr policy stating that "my mother needs an advocate".."she does not hear well and does not understand what the doctors are saying to her".."she has not been formally diagnosed with dementia but has very little comprehension of what people are saying to her".  Spoke with daughter [name] at length and let her know that I would talk with the doctors about it.

## 2019-07-29 NOTE — Anesthesia Preprocedure Evaluation (Addendum)
Anesthesia Evaluation  Patient identified by MRN, date of birth, ID band Patient awake    Reviewed: Allergy & Precautions, NPO status , Patient's Chart, lab work & pertinent test results  Airway Mallampati: I  TM Distance: >3 FB Neck ROM: Full    Dental  (+) Edentulous Upper, Edentulous Lower   Pulmonary sleep apnea , COPD, former smoker,    breath sounds clear to auscultation       Cardiovascular hypertension, + CAD and +CHF  + dysrhythmias + pacemaker  Rhythm:Regular Rate:Normal     Neuro/Psych  Neuromuscular disease negative psych ROS   GI/Hepatic negative GI ROS,   Endo/Other  diabetes, Type 2  Renal/GU      Musculoskeletal  (+) Arthritis ,   Abdominal Normal abdominal exam  (+)   Peds  Hematology  (+) anemia ,   Anesthesia Other Findings   Reproductive/Obstetrics                            Anesthesia Physical Anesthesia Plan  ASA: III  Anesthesia Plan: MAC   Post-op Pain Management:    Induction: Intravenous  PONV Risk Score and Plan: 0 and Propofol infusion  Airway Management Planned: Natural Airway and Nasal Cannula  Additional Equipment: None  Intra-op Plan:   Post-operative Plan:   Informed Consent: I have reviewed the patients History and Physical, chart, labs and discussed the procedure including the risks, benefits and alternatives for the proposed anesthesia with the patient or authorized representative who has indicated his/her understanding and acceptance.       Plan Discussed with: CRNA  Anesthesia Plan Comments: (Lab Results      Component                Value               Date                      WBC                      11.6 (H)            07/28/2019                HGB                      7.5 (L)             07/29/2019                HCT                      22.4 (L)            07/29/2019                MCV                      94.9                 07/28/2019                PLT                      201                 07/28/2019           )  Anesthesia Quick Evaluation  

## 2019-07-29 NOTE — Op Note (Signed)
[facility] Patient Name: [name] Procedure Date : 07/29/2019 MRN: [id number] Attending MD: Ladene Artist , MD Date of Birth: 07-02-1941 CSN: [id number] Age: 78 Admit Type: Inpatient Procedure:                Upper GI endoscopy Indications:              Acute post hemorrhagic anemia, Melena Providers:                Pricilla Riffle. Fuller Plan, MD, Benay Pillow, RN, Grace Isaac, RN, Lazaro Arms, Technician Referring MD:             [facility] Medicines:                Monitored Anesthesia Care Complications:            No immediate complications. Estimated Blood Loss:     Estimated blood loss: none. Estimated blood loss                            was minimal. Procedure:                Pre-Anesthesia Assessment:                           - Prior to the procedure, a History and Physical                            was performed, and patient medications and                            allergies were reviewed. The patient's tolerance of                            previous anesthesia was also reviewed. The risks                            and benefits of the procedure and the sedation                            options and risks were discussed with the patient.                            All questions were answered, and informed consent                            was obtained. Prior Anticoagulants: The patient has                            taken no previous anticoagulant or antiplatelet                            agents. ASA Grade Assessment: III - A patient with  severe systemic disease. After reviewing the risks                            and benefits, the patient was deemed in                            satisfactory condition to undergo the procedure.                           After obtaining informed consent, the endoscope was                            passed under direct vision. Throughout the   procedure, the patient's blood pressure, pulse, and                            oxygen saturations were monitored continuously. The                            [id number]) Olympus gastroscope was                            introduced through the mouth, and advanced to the                            second part of duodenum. The upper GI endoscopy was                            accomplished without difficulty. The patient                            tolerated the procedure well. Scope In: Scope Out: Findings:      The examined esophagus was normal.      A few localized small erosions with no bleeding and no stigmata of       recent bleeding were found in the gastric antrum. Biopsies were taken       with a cold forceps for histology.      Patchy mild inflammation characterized by erythema was found in the       gastric body. Biopsies were taken with a cold forceps for histology.      The exam of the stomach was otherwise normal.      Two non-bleeding cratered duodenal ulcers each with a visible vessel       were found in the duodenal bulb. The largest lesion was 9 mm in largest       dimension. Coagulation for hemostasis using bipolar probe was successful.      The second portion of the duodenum was normal. Impression:               - Normal esophagus.                           - Erosive gastropathy with no bleeding and no                            stigmata of recent bleeding.  Biopsied.                           - Gastritis. Biopsied.                           - Two non-bleeding duodenal ulcers each with a                            visible vessel. Treated with bipolar cautery.                           - Normal second portion of the duodenum. Recommendation:           - Return patient to hospital ward for ongoing care.                           - Clear liquid diet today.                           - Protonix (pantoprazole): initiate therapy with 80                            mg IV  bolus, then 8 mg/hr IV by continuous infusion                            for 3 days, then pantoprazole 40 mg bid.                           - No aspirin, ibuprofen, naproxen, or other                            non-steroidal anti-inflammatory drugs long term.                           - Await pathology results. Procedure Code(s):        --- Professional ---                           [id number], 18, Esophagogastroduodenoscopy, flexible,                            transoral; with control of bleeding, any method                           43239, Esophagogastroduodenoscopy, flexible,                            transoral; with biopsy, single or multiple Diagnosis Code(s):        --- Professional ---                           K31.89, Other diseases of stomach and duodenum                           K29.70, Gastritis, unspecified, without bleeding  K26.4, Chronic or unspecified duodenal ulcer with                            hemorrhage                           D62, Acute posthemorrhagic anemia                           K92.1, Melena (includes Hematochezia) CPT copyright 2019 [employer]. All rights reserved. The codes documented in this report are preliminary and upon coder review may  be revised to meet current compliance requirements. Ladene Artist, MD 07/29/2019 3:44:59 PM This report has been signed electronically. Number of Addenda: 0

## 2019-07-29 NOTE — ED Provider Notes (Signed)
[facility] EMERGENCY DEPARTMENT Provider Note   CSN: [id number] Arrival date & time: 07/28/19  2120     History   Chief Complaint Chief Complaint  Patient presents with   Emesis   Gen. Weakness    HPI [name] is a 78 y.o. female.     HPI  This is a 78 year old female with a history of heart failure, heart block with a pacemaker, diabetes who presents with generalized weakness, dizziness, episode of vomiting.  Daughter reports that she picked her up for dinner tonight.  Patient was complaining of feeling lightheaded and dizzy.  She came and ate dinner as usual.  She got up to go to the bathroom and could barely walk on her own because of dizziness and weakness.  He began to retch.  She is not had any chest pain, new shortness of breath, fevers, abdominal pain.  She does report recent history of dark tarry stools.  This has been worked up by her primary physician.  She has had 3 Hemoccult positive stool cards.  She had a transient drop in her hemoglobin in September to 10.4 but this rebounded.  She has been seen by gastroenterology and at this time being followed without plan for intervention or colonoscopy.  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   "after spinal cord injury/OR"   Shortness of breath    exertional   Type II diabetes mellitus ([place])    "had it years ago; lost weight after neck OR in 2007; glucose went way  down; recently dx'd as returned recently" (12/07/2014)   Urinary incontinence    "since 2007's OR"   Venous insufficiency (chronic) (peripheral)     Patient Active Problem List   Diagnosis Date Noted   Normocytic anemia 07/04/2019   Heme positive stool 07/04/2019   Other constipation 07/04/2019   Low hemoglobin 06/15/2019   Physical deconditioning 05/05/2019   Healthcare maintenance 10/20/2018   Other chronic pain 10/20/2018   Rib fracture 07/02/2018   Diabetes mellitus type 2, controlled, without complications ([place]) [id number]   Acute respiratory failure with hypoxia ([place]) 07/01/2018   Pacemaker battery depletion 05/26/2018   Mild obesity 01/05/2016   Chronic respiratory failure with hypoxia ([place]) 10/29/2015   Upper airway cough syndrome 03/04/2015   COPD GOLD II 01/18/2015   Pulmonary hypertension (HCC)    Dyspnea    DOE (dyspnea on exertion)    Sleep apnea    Hypoxemia    Acute respiratory failure with hypoxemia (HCC)    Acute pulmonary edema (HCC)    Chronic diastolic heart failure ([place]) 12/06/2014   Accelerated hypertension with diastolic congestive heart failure, NYHA class 3 ([place]) 12/06/2014   CHB (complete heart block) ([place]) 09/19/2013   Pacemaker - dual chamber St Jude, 2009 09/19/2013   Essential hypertension 09/19/2013   Normal coronary arteries angiography 2002 09/19/2013  Hyperlipidemia 09/19/2013   Varicose veins of lower extremities with other complications [id number]    Past Surgical History:  Procedure Laterality Date   ANTERIOR CERVICAL DECOMP/DISCECTOMY FUSION  2007   BACK SURGERY     CARDIAC CATHETERIZATION  09/10/2000   normal Coronary arteries, EF60%,    CARDIAC PACEMAKER PLACEMENT  12/01/2007   St.Jude Zephyr Elta [place] [id number], dual chamber   CARDIOVASCULAR STRESS TEST  08/28/2000   Cardiolite perfusion study, EF58%, low risk study,    DILATION AND CURETTAGE OF UTERUS  1960's   KNEE ARTHROSCOPY Right     LAPAROSCOPIC CHOLECYSTECTOMY  1995   LEFT AND RIGHT HEART CATHETERIZATION WITH CORONARY ANGIOGRAM N/A 12/14/2014   Procedure: LEFT AND RIGHT HEART CATHETERIZATION WITH CORONARY ANGIOGRAM;  Surgeon: Peter M Martinique, MD;  Location: [facility] CATH LAB;  Service: Cardiovascular;  Laterality: N/A;   Lower Ext. Dopplers  06/12/2011   no evidence of thrombus, all vessels normal in size, no evidence of insuffiency   LUMBAR LAMINECTOMY/DECOMPRESSION MICRODISCECTOMY  03/02/2012   Procedure: LUMBAR LAMINECTOMY/DECOMPRESSION MICRODISCECTOMY 2 LEVELS;  Surgeon: Floyce Stakes, MD;  Location: [facility] NEURO ORS;  Service: Neurosurgery;  Laterality: Bilateral;  Lumbar three, lumbar four, lumbar five Laminectomy/Foraminotomy   MOLE REMOVAL     left side of face near her eye   NM MYOCAR PERF WALL MOTION  10/24/2008   protocol:Lexiscan, EF63%, normal study, scan negative for ischemia   PPM GENERATOR CHANGEOUT N/A 05/26/2018   Procedure: PPM GENERATOR CHANGEOUT;  Surgeon: Sanda Klein, MD;  Location: [facility] CV LAB;  Service: Cardiovascular;  Laterality: N/A;   TRANSTHORACIC ECHOCARDIOGRAM  11/30/2007   normal echo, mean transaortic valve gradient 43mmHg     OB History   No obstetric history on file.      Home Medications    Prior to Admission medications   Medication Sig Start Date End Date Taking? Authorizing Provider  acetaminophen (TYLENOL) 500 MG tablet Take 500 mg by mouth 4 (four) times daily -  with meals and at bedtime.    [provider]  aspirin EC 81 MG tablet Take 81 mg by mouth daily.    [provider]  DULoxetine (CYMBALTA) 20 MG capsule TAKE 1 CAPSULE (20 MG TOTAL) BY MOUTH DAILY. OFFICE VISIT REQUIRED PRIOR TO ANY FURTHER REFILLS 06/16/19   Danford, Valetta Fuller D, NP  furosemide (LASIX) 40 MG tablet Take 1 tablet (40 mg total) by mouth daily. May take an extra tablet for weight gain of 2 lb in a day and 5 lbs in a weeks. 05/20/19   Kroeger, Lorelee Cover., PA-C  ibuprofen  (ADVIL,MOTRIN) 200 MG tablet Take 200 mg by mouth every 6 (six) hours as needed.    [provider]  losartan (COZAAR) 50 MG tablet Take 1 tablet (50 mg total) by mouth daily. 06/16/19   Danford, Valetta Fuller D, NP  meloxicam (MOBIC) 15 MG tablet Take 15 mg by mouth daily.    [provider]  metFORMIN (GLUCOPHAGE) 500 MG tablet 1/2 tablet nightly with dinner 10/20/18   Danford, Valetta Fuller D, NP  metoprolol succinate (TOPROL-XL) 25 MG 24 hr tablet Take 0.5 tablets (12.5 mg total) by mouth daily. Take with or immediately following a meal. 05/20/19   Kroeger, Lorelee Cover., PA-C  OXYGEN 2lpm with sleep and exertion [employer]    [provider]  potassium chloride SA (KLOR-CON M20) 20 MEQ tablet Take 1 tablet (20 mEq total) by mouth daily. 05/20/19   Kroeger, Lorelee Cover., PA-C  pravastatin (PRAVACHOL) 40 MG tablet  TAKE 1 TABLET BY MOUTH EVERY DAY 07/04/19   Mina Marble D, NP  Respiratory Therapy Supplies (FLUTTER) DEVI Use flutter device three times a day as instructed 05/13/19   Parrett, Tammy S, NP  Tiotropium Bromide-Olodaterol (STIOLTO RESPIMAT) 2.5-2.5 MCG/ACT AERS Inhale 2 puffs into the lungs daily. 05/06/19   Parrett, Fonnie Mu, NP    Family History Family History  Problem Relation Age of Onset   Diabetes Father    Heart disease Father        before age 41   Hyperlipidemia Father    Hypertension Father    Heart attack Father    Congestive Heart Failure Father    Diabetes Mother    Heart disease Mother    Hyperlipidemia Mother    Hypertension Mother    Congestive Heart Failure Mother    Diabetes Brother    Congestive Heart Failure Brother    Colon cancer Neg Hx    Esophageal cancer Neg Hx    Rectal cancer Neg Hx     Social History Social History   Tobacco Use   Smoking status: Former Smoker    Packs/day: 0.50    Years: 32.00    Pack years: 16.00    Types: Cigarettes    Start date: 1963    Quit date: 03/02/1994    Years since quitting: 25.4    Smokeless tobacco: Never Used  Substance Use Topics   Alcohol use: No   Drug use: No     Allergies   Patient has no known allergies.   Review of Systems Review of Systems  Constitutional: Negative for fever.  Respiratory: Negative for shortness of breath.   Cardiovascular: Negative for chest pain.  Gastrointestinal: Positive for nausea and vomiting. Negative for abdominal pain and diarrhea.       Dark tarry stool  Genitourinary: Negative for dysuria.  Skin: Negative for rash.  All other systems reviewed and are negative.    Physical Exam Updated Vital Signs BP 115/60 (BP Location: Right Arm)    Pulse 70    Temp 98.7 F (37.1 C) (Oral)    Resp 19    SpO2 100%   Physical Exam Vitals signs and nursing note reviewed.  Constitutional:      Appearance: She is well-developed.     Comments: Elderly, chronically ill-appearing, no acute distress  HENT:     Head: Normocephalic and atraumatic.     Mouth/Throat:     Mouth: Mucous membranes are dry.  Eyes:     Pupils: Pupils are equal, round, and reactive to light.  Neck:     Musculoskeletal: Neck supple.  Cardiovascular:     Rate and Rhythm: Normal rate and regular rhythm.     Heart sounds: Normal heart sounds.  Pulmonary:     Effort: Pulmonary effort is normal. No respiratory distress.     Breath sounds: No wheezing.  Abdominal:     General: Bowel sounds are normal.     Palpations: Abdomen is soft.     Tenderness: There is no abdominal tenderness. There is no guarding or rebound.  Genitourinary:    Comments: Deferred Musculoskeletal:     Right lower leg: No edema.     Left lower leg: No edema.  Skin:    General: Skin is warm and dry.  Neurological:     Mental Status: She is alert and oriented to person, place, and time.  Psychiatric:        Mood and Affect: Mood normal.  ED Treatments / Results  Labs (all labs ordered are listed, but only abnormal results are displayed) Labs Reviewed  CBC WITH  DIFFERENTIAL/PLATELET - Abnormal; Notable for the following components:      Result Value   WBC 11.6 (*)    RBC 2.56 (*)    Hemoglobin 7.7 (*)    HCT 24.3 (*)    Neutro Abs 9.1 (*)    All other components within normal limits  COMPREHENSIVE METABOLIC PANEL - Abnormal; Notable for the following components:   Chloride 97 (*)    Glucose, Bld 179 (*)    BUN 63 (*)    Creatinine, Ser 1.09 (*)    Total Protein 5.7 (*)    Alkaline Phosphatase 36 (*)    GFR calc non Af Amer 48 (*)    GFR calc Af Amer 56 (*)    All other components within normal limits  BRAIN NATRIURETIC PEPTIDE - Abnormal; Notable for the following components:   B Natriuretic Peptide 126.7 (*)    All other components within normal limits  SARS CORONAVIRUS 2 (TAT 6-24 HRS)  PROTIME-INR  HEMOGLOBIN AND HEMATOCRIT, BLOOD  TYPE AND SCREEN  TROPONIN I (HIGH SENSITIVITY)  TROPONIN I (HIGH SENSITIVITY)    EKG EKG Interpretation  Date/Time:  Thursday July 28 2019 21:26:15 EST Ventricular Rate:  92 PR Interval:  174 QRS Duration: 148 QT Interval:  424 QTC Calculation: 524 R Axis:   -100 Text Interpretation: Atrial-sensed ventricular-paced rhythm Abnormal ECG Confirmed by Thayer Jew [phone]) on 07/28/2019 11:54:40 PM   Radiology Dg Chest 2 View  Result Date: 07/28/2019 CLINICAL DATA:  Shortness of breath EXAM: CHEST - 2 VIEW COMPARISON:  Cardiac CT 06/03/2019, radiograph 05/13/2019 FINDINGS: Chronically coarsened interstitial opacities are similar to prior. No consolidation, features of edema, pneumothorax, or effusion. The aorta is calcified. The remaining cardiomediastinal contours are unremarkable. Pacer pack overlies the left chest wall with leads at the cardiac apex and right atrium. Cervical fusion hardware is noted. Degenerative changes are present in the imaged spine and shoulders. Dextrocurvature of the spine is similar to prior. IMPRESSION: 1. Stable chronic interstitial changes. No superimposed acute  cardiopulmonary process. 2.  Aortic Atherosclerosis (ICD10-I70.0). Electronically Signed   By: Lovena Le M.D.   On: 07/28/2019 22:07    Procedures Procedures (including critical care time)  Medications Ordered in ED Medications  sodium chloride 0.9 % bolus 250 mL (has no administration in time range)  pantoprazole (PROTONIX) injection 40 mg (has no administration in time range)     Initial Impression / Assessment and Plan / ED Course  I have reviewed the triage vital signs and the nursing notes.  Pertinent labs & imaging results that were available during my care of the patient were reviewed by me and considered in my medical decision making (see chart for details).        Patient presents with an episode of dizziness and retching.  Sounds near syncopal.  On my evaluation she is overall nontoxic and her vital signs are reassuring.  She is in a paced rhythm on her EKG.  She does report recent history of melena although hemoglobins have been stable.  Today her hemoglobin is 7.7.  Suspect she may be symptomatically anemic given her symptoms.  She was typed and screened.  I gave her IV Protonix.  She is hemodynamically stable at this time.  Given her near syncopal and hemoglobin of 7.7, feel she would benefit from admission and GI evaluation.  She has  previously been followed by [facility].  Final Clinical Impressions(s) / ED Diagnoses   Final diagnoses:  Symptomatic anemia  Melena  Near syncope    ED Discharge Orders    None       Teigen Parslow, Barbette Hair, MD 07/29/19 [phone]

## 2019-07-29 NOTE — Progress Notes (Signed)
PROGRESS NOTE  TANNETTE CLEMENSON [id number] DOB: 08-17-1941 DOA: 07/28/2019 PCP: Esaw Grandchild, NP  HPI/Recap of past 24 hours: HPI from Dr Letitia Neri is a 78 y.o. female with medical history significant for COPD, chronic hypoxic respiratory failure, complete heart block with pacer, chronic combined systolic and diastolic CHF, type 2 diabetes mellitus, and recent dark stool with anemia, now presenting to the ED for evaluation of increased fatigue, generalized weakness, and near syncope.  Patient was accompanied by her daughter who assists with the history. Patient was experiencing increased fatigue and generalized weakness, went to her daughter's house for Thanksgiving dinner, continued to feel poorly, and developed nausea and presyncope.  She had been experiencing dark stools for couple months and had a drop in her hemoglobin from 12.4 down to 10.4 in September 2020.  She had fecal occult blood testing that was positive x3. She had been using NSAIDs daily for her chronic arthritic pains, but now limits her analgesics to Tylenol. In the ED, patient is found to be stable, labs showed BUN of 63 and creatinine 1.09, hemoglobin 7.7, down from 11.6 ten days earlier. Patient was given 250 cc normal saline and 40 mg IV Protonix in the ED, type and screen was performed, and COVID-19 test negative.    Today, patient still reports generalized weakness/fatigue, denies it worsening.  Patient denies any chest pain, worsening shortness of breath, nausea/vomiting/diarrhea, fever/chills, denies any BRBPR or any current melena (no BM since admission)    Assessment/Plan: Principal Problem:   Symptomatic anemia Active Problems:   Chronic combined systolic and diastolic CHF (congestive heart failure) (HCC)   COPD GOLD II   Diabetes mellitus type 2, controlled, without complications (HCC)   Mild renal insufficiency   Pressure injury of skin  Likely upper GI bleeding  Melena, generalized weakness,  drop in hemoglobin, NSAID use FOBT positive GI consulted, EGD done showed gastritis, biopsied.  Also showed 2 nonbleeding duodenal ulcers each with a visible vessel treated with bipolar cautery.  Starts Protonix drip for 3 days, then p.o. PPI 40 mg twice daily Continue Protonix drip, clear liquid diet GI recommend no more aspirin, NSAID  Acute on chronic blood loss/symptomatic anemia New baseline around 11 Hemoglobin with significant drop, down to 6.7 Transfuse 1 unit of PRBC Frequent H&H checks Management as above  COPD/chronic hypoxic respiratory failure  Stable, on home O2, 2 L at baseline Continue inhalers  Chronic combined systolic & diastolic CHF  Appears compensated  EF was 45-50% in September [id number] with diastolic dysfunction, severe LAE, and moderate TR  Continue to hold Lasix and ARB  Type II DM  A1c was 6.2% in November 2020  SSI, hypoglycemic protocol, Accu-Cheks Hold home Metformin  Complete heart block status post pacer Stable          Malnutrition Type:      Malnutrition Characteristics:      Nutrition Interventions:       Estimated body mass index is 25.48 kg/m as calculated from the following:   Height as of this encounter: 5\' 7"  (1.702 m).   Weight as of this encounter: 73.8 kg.     Code Status: Full  Family Communication: Spoke with daughter on 07/29/2019  Disposition Plan: To be determined   Consultants:  GI  Procedures:  EGD on 07/29/2019  Antimicrobials:  None  DVT prophylaxis: SCD   Objective: Vitals:   07/29/19 0912 07/29/19 0946 07/29/19 1121 07/29/19 1438  BP: (!) 120/41 (!) 127/34 Marland Kitchen)  142/48 136/75  Pulse: 70 76 72 88  Resp: 17 18 17 17   Temp: 98 F (36.7 C) 99.1 F (37.3 C) 98.8 F (37.1 C) 99.2 F (37.3 C)  TempSrc: Oral Oral Oral Temporal  SpO2: 100% 100% 100% 100%  Weight:      Height:        Intake/Output Summary (Last 24 hours) at 07/29/2019 1538 Last data filed at 07/29/2019 1529  Gross per 24 hour  Intake 1673.42 ml  Output 1100 ml  Net 573.42 ml   Filed Weights   07/29/19 0130 07/29/19 0800  Weight: 73.8 kg 73.8 kg    Exam:  General: NAD, acutely ill-appearing  Cardiovascular: S1, S2 present  Respiratory: CTAB  Abdomen: Soft, nontender, nondistended, bowel sounds present  Musculoskeletal: No bilateral pedal edema noted  Skin: Normal  Psychiatry: Normal mood   Data Reviewed: CBC: Recent Labs  Lab 07/28/19 2151 07/28/19 2354 07/29/19 0515 07/29/19 1234  WBC 11.6*  --   --   --   NEUTROABS 9.1*  --   --   --   HGB 7.7* 7.3* 6.7* 7.5*  HCT 24.3* 22.5* 20.7* 22.4*  MCV 94.9  --   --   --   PLT 201  --   --   --    Basic Metabolic Panel: Recent Labs  Lab 07/28/19 2151 07/29/19 0515  NA 135 135  K 4.2 4.1  CL 97* 99  CO2 24 26  GLUCOSE 179* 123*  BUN 63* 55*  CREATININE 1.09* 0.80  CALCIUM 8.9 8.6*   GFR: Estimated Creatinine Clearance: 55.5 mL/min (by C-G formula based on SCr of 0.8 mg/dL). Liver Function Tests: Recent Labs  Lab 07/28/19 2151  AST 19  ALT 14  ALKPHOS 36*  BILITOT 0.5  PROT 5.7*  ALBUMIN 3.5   No results for input(s): LIPASE, AMYLASE in the last 168 hours. No results for input(s): AMMONIA in the last 168 hours. Coagulation Profile: Recent Labs  Lab 07/28/19 2151  INR 1.0   Cardiac Enzymes: No results for input(s): CKTOTAL, CKMB, CKMBINDEX, TROPONINI in the last 168 hours. BNP (last 3 results) Recent Labs    10/13/18 1629 05/05/19 1720  PROBNP 577.0* 1,161.0*   HbA1C: No results for input(s): HGBA1C in the last 72 hours. CBG: Recent Labs  Lab 07/29/19 0411 07/29/19 0909 07/29/19 1129  GLUCAP 119* 137* 128*   Lipid Profile: No results for input(s): CHOL, HDL, LDLCALC, TRIG, CHOLHDL, LDLDIRECT in the last 72 hours. Thyroid Function Tests: No results for input(s): TSH, T4TOTAL, FREET4, T3FREE, THYROIDAB in the last 72 hours. Anemia Panel: No results for input(s): VITAMINB12, FOLATE,  FERRITIN, TIBC, IRON, RETICCTPCT in the last 72 hours. Urine analysis:    Component Value Date/Time   COLORURINE YELLOW 07/01/2018 1030   APPEARANCEUR CLOUDY (A) 07/01/2018 1030   LABSPEC >1.046 (H) 07/01/2018 1030   PHURINE 9.0 (H) 07/01/2018 1030   GLUCOSEU NEGATIVE 07/01/2018 1030   HGBUR NEGATIVE 07/01/2018 1030   BILIRUBINUR NEGATIVE 07/01/2018 1030   KETONESUR 20 (A) 07/01/2018 1030   PROTEINUR NEGATIVE 07/01/2018 1030   UROBILINOGEN 0.2 12/06/2014 1642   NITRITE NEGATIVE 07/01/2018 1030   LEUKOCYTESUR NEGATIVE 07/01/2018 1030   Sepsis Labs: @LABRCNTIP (procalcitonin:4,lacticidven:4)  ) Recent Results (from the past 240 hour(s))  SARS CORONAVIRUS 2 (TAT 6-24 HRS) Nasopharyngeal Nasopharyngeal Swab     Status: None   Collection Time: 07/29/19 12:09 AM   Specimen: Nasopharyngeal Swab  Result Value Ref Range Status   SARS Coronavirus 2 NEGATIVE  NEGATIVE Final    Comment: (NOTE) SARS-CoV-2 target nucleic acids are NOT DETECTED. The SARS-CoV-2 RNA is generally detectable in upper and lower respiratory specimens during the acute phase of infection. Negative results do not preclude SARS-CoV-2 infection, do not rule out co-infections with other pathogens, and should not be used as the sole basis for treatment or other patient management decisions. Negative results must be combined with clinical observations, patient history, and epidemiological information. The expected result is Negative. Fact Sheet for Patients: SugarRoll.be Fact Sheet for Healthcare Providers: https://www.woods-mathews.com/ This test is not yet approved or cleared by the [place] FDA and  has been authorized for detection and/or diagnosis of SARS-CoV-2 by FDA under an Emergency Use Authorization (EUA). This EUA will remain  in effect (meaning this test can be used) for the duration of the COVID-19 declaration under Section 56 4(b)(1) of the Act, 21 U.S.C.  section 360bbb-3(b)(1), unless the authorization is terminated or revoked sooner. Performed at [facility], [place] [address] [place], [place] [address]       Studies: Dg Chest 2 View  Result Date: 07/28/2019 CLINICAL DATA:  Shortness of breath EXAM: CHEST - 2 VIEW COMPARISON:  Cardiac CT 06/03/2019, radiograph 05/13/2019 FINDINGS: Chronically coarsened interstitial opacities are similar to prior. No consolidation, features of edema, pneumothorax, or effusion. The aorta is calcified. The remaining cardiomediastinal contours are unremarkable. Pacer pack overlies the left chest wall with leads at the cardiac apex and right atrium. Cervical fusion hardware is noted. Degenerative changes are present in the imaged spine and shoulders. Dextrocurvature of the spine is similar to prior. IMPRESSION: 1. Stable chronic interstitial changes. No superimposed acute cardiopulmonary process. 2.  Aortic Atherosclerosis (ICD10-I70.0). Electronically Signed   By: Lovena Le M.D.   On: 07/28/2019 22:07    Scheduled Meds: . [MAR Hold] arformoterol  15 mcg Nebulization BID  . [MAR Hold] DULoxetine  20 mg Oral Daily  . [MAR Hold] feeding supplement  1 Container Oral TID BM  . [MAR Hold] insulin aspart  0-7 Units Subcutaneous Q4H  . [MAR Hold] mouth rinse  15 mL Mouth Rinse BID  . [MAR Hold] metoprolol succinate  12.5 mg Oral Daily  . [MAR Hold] pantoprazole (PROTONIX) IV  40 mg Intravenous Q12H  . [MAR Hold] pravastatin  40 mg Oral Daily  . [MAR Hold] sodium chloride flush  3 mL Intravenous Q12H  . [MAR Hold] sodium chloride flush  3 mL Intravenous Q12H  . [MAR Hold] umeclidinium bromide  1 puff Inhalation Daily    Continuous Infusions: . [MAR Hold] sodium chloride       LOS: 0 days     Alma Friendly, MD [facility]  If 7PM-7AM, please contact night-coverage [email or web address] 07/29/2019, 3:38 PM

## 2019-07-29 NOTE — H&P (View-Only) (Signed)
Referring Provider:  [facility]         Primary Care Physician:  Esaw Grandchild, NP Primary Gastroenterologist:  Owens Loffler, MD           Reason for Consultation:   GI bleed           ASSESSMENT /  PLAN   78 year old female with PMH significant for COPD on home O2, heart failure, pacemaker normocytic anemia with Hemoccult positive, dark stool.   54.  78 year old female admitted with heme positive dark stools / symptomatic anemia. Suspect upper GI bleeding. Significant NSAID use up until ~ one month ago. Rule out erosive disease, PUD. Some minor rectal bleeding at times but suspect separate issue and related to hemorrhoids.  -Patient will be scheduled for EGD. The risks and benefits of EGD were discussed and the patient agrees to proceed.  -Continue bid PPI  2. COPD, chronic hypoxic respiratory failure on oxygen at home  3. Chronic combined systolic and diastolic heart failure.  EF 45 to 50% September 2020.  Severe LAE  4. DM2.  A1c was 6.2% in November.  Metformin on hold  5. History of adenomatous colon polyps 2012. Given age and co-morbidities she is a not good candidate for future surveillance colonoscopies    Attending Physician Note   I have taken a history, examined the patient and reviewed the chart. I agree with the Advanced Practitioner's note, impression and recommendations.  Dark, heme + stool, worsening anemia, history of NSAID usage - R/O ulcer, gastritis  COPD, chronic respiratory failure on home O2  IV PPI bid Avoid NSAIDs EGD today Transfusions to maintain Hgb > 7-8   Lucio Edward, MD [facility]    HPI:     KAFI KEZER is a 78 y.o. female with PMH as outlined below.  We saw her in the office on the second day of this month for evaluation of normocytic anemia with Hemoccult positive stool x3.  Iron studies were normal.  Patient reported black stools.  She admitted to very small amounts of bright red blood associated with  hemorrhoids.  Patient at high risk for procedures so after extensive discussion with the family, we agreed to just follow CBC, discontinue NSAIDs, treat constipation to avoid hemorrhoidal flares.  Her repeat hemoglobin was 11.7, down from 12.4 mid October of this year.  We asked the patient to return for repeat labs in a couple weeks which she did.  Hemoglobin was stable at 11.7 on the 16th of this month.   Sometime between the 16th of this month and yesterday patient's hemoglobin declined from 11.6 to 7.7.  She presented to the ED around midnight with complaints of dizziness/weakness.  Admitted now with symptomatic anemia   **2012 Screening colonoscopy ([facility]), diverticulosis, small rectal polyp (tubular adenoma)    Past Medical History:  Diagnosis Date   Arthritis    "hands, spine" (12/07/2014)   CHB (complete heart block) ([place]) 09/19/2013   CHF (congestive heart failure) ([place]) dx'd 12/06/2014   Chronic pain    "since neck OR in 2007"   Coronary artery disease    normal coronaries by 09/10/00 cath   Diabetes mellitus    hga1c 7.1 no meds   Dysrhythmia    HTN (hypertension) 09/19/2013   Hypercholesterolemia    Hyperlipidemia 09/19/2013   Hypertension    Myocardial infarction [facility]) dx'd 2009   Neuropathy of both feet    Pacemaker    Paralysis of right hand ([place])  2007   "after spinal cord injury/OR"   Shortness of breath    exertional   Type II diabetes mellitus ([place])    "had it years ago; lost weight after neck OR in 2007; glucose went way down; recently dx'd as returned recently" (12/07/2014)   Urinary incontinence    "since 2007's OR"   Venous insufficiency (chronic) (peripheral)     Past Surgical History:  Procedure Laterality Date   ANTERIOR CERVICAL DECOMP/DISCECTOMY FUSION  2007   BACK SURGERY     CARDIAC CATHETERIZATION  09/10/2000   normal Coronary arteries, EF60%,    CARDIAC PACEMAKER PLACEMENT  12/01/2007   [employer] [facility] Elta [place] [id number], dual  chamber   CARDIOVASCULAR STRESS TEST  08/28/2000   Cardiolite perfusion study, EF58%, low risk study,    DILATION AND CURETTAGE OF UTERUS  1960's   KNEE ARTHROSCOPY Right    LAPAROSCOPIC CHOLECYSTECTOMY  1995   LEFT AND RIGHT HEART CATHETERIZATION WITH CORONARY ANGIOGRAM N/A 12/14/2014   Procedure: LEFT AND RIGHT HEART CATHETERIZATION WITH CORONARY ANGIOGRAM;  Surgeon: Peter M Martinique, MD;  Location: [facility] CATH LAB;  Service: Cardiovascular;  Laterality: N/A;   Lower Ext. Dopplers  06/12/2011   no evidence of thrombus, all vessels normal in size, no evidence of insuffiency   LUMBAR LAMINECTOMY/DECOMPRESSION MICRODISCECTOMY  03/02/2012   Procedure: LUMBAR LAMINECTOMY/DECOMPRESSION MICRODISCECTOMY 2 LEVELS;  Surgeon: Floyce Stakes, MD;  Location: [place] NEURO ORS;  Service: Neurosurgery;  Laterality: Bilateral;  Lumbar three, lumbar four, lumbar five Laminectomy/Foraminotomy   MOLE REMOVAL     left side of face near her eye   NM MYOCAR PERF WALL MOTION  10/24/2008   protocol:Lexiscan, EF63%, normal study, scan negative for ischemia   PPM GENERATOR CHANGEOUT N/A 05/26/2018   Procedure: PPM GENERATOR CHANGEOUT;  Surgeon: Sanda Klein, MD;  Location: [place] CV LAB;  Service: Cardiovascular;  Laterality: N/A;   TRANSTHORACIC ECHOCARDIOGRAM  11/30/2007   normal echo, mean transaortic valve gradient 80mmHg    Prior to Admission medications   Medication Sig Start Date End Date Taking? Authorizing Provider  acetaminophen (TYLENOL) 500 MG tablet Take 500 mg by mouth every 6 (six) hours as needed for mild pain.    Yes [provider]  aspirin EC 81 MG tablet Take 81 mg by mouth daily.   Yes [provider]  DULoxetine (CYMBALTA) 20 MG capsule TAKE 1 CAPSULE (20 MG TOTAL) BY MOUTH DAILY. OFFICE VISIT REQUIRED PRIOR TO ANY FURTHER REFILLS Patient taking differently: Take 20 mg by mouth daily.  06/16/19  Yes Danford, Valetta Fuller D, NP  furosemide (LASIX) 40 MG tablet Take 1 tablet  (40 mg total) by mouth daily. May take an extra tablet for weight gain of 2 lb in a day and 5 lbs in a weeks. 05/20/19  Yes Kroeger, Daleen Snook M., PA-C  losartan (COZAAR) 50 MG tablet Take 1 tablet (50 mg total) by mouth daily. 06/16/19  Yes Danford, Valetta Fuller D, NP  meloxicam (MOBIC) 15 MG tablet Take 15 mg by mouth at bedtime.   Yes [provider]  metFORMIN (GLUCOPHAGE) 500 MG tablet 1/2 tablet nightly with dinner Patient taking differently: Take 250 mg by mouth every morning.  10/20/18  Yes Danford, Valetta Fuller D, NP  metoprolol succinate (TOPROL-XL) 25 MG 24 hr tablet Take 0.5 tablets (12.5 mg total) by mouth daily. Take with or immediately following a meal. 05/20/19  Yes Kroeger, Lorelee Cover., PA-C  Nutritional Supplements (JUICE PLUS FIBRE PO) Take 3 tablets by mouth at bedtime.  Yes [provider]  OXYGEN 2lpm with sleep and exertion [employer]   Yes [provider]  potassium chloride SA (KLOR-CON M20) 20 MEQ tablet Take 1 tablet (20 mEq total) by mouth daily. 05/20/19  Yes Kroeger, Daleen Snook M., PA-C  pravastatin (PRAVACHOL) 40 MG tablet TAKE 1 TABLET BY MOUTH EVERY DAY Patient taking differently: Take 40 mg by mouth daily.  07/04/19  Yes Danford, Valetta Fuller D, NP  Tiotropium Bromide-Olodaterol (STIOLTO RESPIMAT) 2.5-2.5 MCG/ACT AERS Inhale 2 puffs into the lungs daily. Patient taking differently: Inhale 2 puffs into the lungs daily with lunch.  05/06/19  Yes Parrett, Fonnie Mu, NP  Respiratory Therapy Supplies (FLUTTER) DEVI Use flutter device three times a day as instructed 05/13/19   Parrett, Fonnie Mu, NP    Current Facility-Administered Medications  Medication Dose Route Frequency Provider Last Rate Last Dose   0.9 %  sodium chloride infusion  250 mL Intravenous PRN Opyd, Ilene Qua, MD       acetaminophen (TYLENOL) tablet 650 mg  650 mg Oral Q6H PRN Opyd, Ilene Qua, MD       Or   acetaminophen (TYLENOL) suppository 650 mg  650 mg Rectal Q6H PRN Opyd, Ilene Qua, MD       arformoterol  (BROVANA) nebulizer solution 15 mcg  15 mcg Nebulization BID Opyd, Ilene Qua, MD   15 mcg at 07/29/19 [id number]   DULoxetine (CYMBALTA) DR capsule 20 mg  20 mg Oral Daily Alma Friendly, MD       feeding supplement (BOOST / RESOURCE BREEZE) liquid 1 Container  1 Container Oral TID BM Opyd, Ilene Qua, MD   1 Container at 07/29/19 1322   insulin aspart (novoLOG) injection 0-7 Units  0-7 Units Subcutaneous Q4H Opyd, Ilene Qua, MD       MEDLINE mouth rinse  15 mL Mouth Rinse BID Opyd, Ilene Qua, MD   15 mL at 07/29/19 [id number]   metoprolol succinate (TOPROL-XL) 24 hr tablet 12.5 mg  12.5 mg Oral Daily Alma Friendly, MD   12.5 mg at 07/29/19 1317   ondansetron (ZOFRAN) tablet 4 mg  4 mg Oral Q6H PRN Opyd, Ilene Qua, MD       Or   ondansetron (ZOFRAN) injection 4 mg  4 mg Intravenous Q6H PRN Opyd, Ilene Qua, MD       pantoprazole (PROTONIX) injection 40 mg  40 mg Intravenous Q12H Opyd, Ilene Qua, MD   40 mg at 07/29/19 1219   pravastatin (PRAVACHOL) tablet 40 mg  40 mg Oral Daily Alma Friendly, MD   40 mg at 07/29/19 1317   sodium chloride flush (NS) 0.9 % injection 3 mL  3 mL Intravenous Q12H Opyd, Ilene Qua, MD       sodium chloride flush (NS) 0.9 % injection 3 mL  3 mL Intravenous Q12H Opyd, Ilene Qua, MD   3 mL at 07/29/19 [id number]   sodium chloride flush (NS) 0.9 % injection 3 mL  3 mL Intravenous PRN Opyd, Ilene Qua, MD       umeclidinium bromide (INCRUSE ELLIPTA) 62.5 MCG/INH 1 puff  1 puff Inhalation Daily Opyd, Ilene Qua, MD   1 puff at 07/29/19 [id number]    Allergies as of 07/28/2019   (No Known Allergies)    Family History  Problem Relation Age of Onset   Diabetes Father    Heart disease Father        before age 82   Hyperlipidemia Father    Hypertension Father  Heart attack Father    Congestive Heart Failure Father    Diabetes Mother    Heart disease Mother    Hyperlipidemia Mother    Hypertension Mother    Congestive Heart Failure Mother     Diabetes Brother    Congestive Heart Failure Brother    Colon cancer Neg Hx    Esophageal cancer Neg Hx    Rectal cancer Neg Hx     Social History   Socioeconomic History   Marital status: Widowed    Spouse name: Not on file   Number of children: 2   Years of education: Not on file   Highest education level: Not on file  Occupational History   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, product/process development strain: Not on file   Food insecurity    Worry: Not on file    Inability: Not on Lexicographer needs    Medical: Not on file    Non-medical: Not on file  Tobacco Use   Smoking status: Former Smoker    Packs/day: 0.50    Years: 32.00    Pack years: 16.00    Types: Cigarettes    Start date: 1963    Quit date: 03/02/1994    Years since quitting: 25.4   Smokeless tobacco: Never Used  Substance and Sexual Activity   Alcohol use: No   Drug use: No   Sexual activity: Not Currently    Birth control/protection: None  Lifestyle   Physical activity    Days per week: Not on file    Minutes per session: Not on file   Stress: Not on file  Relationships   Social connections    Talks on phone: Not on file    Gets together: Not on file    Attends religious service: Not on file    Active member of club or organization: Not on file    Attends meetings of clubs or organizations: Not on file    Relationship status: Not on file   Intimate partner violence    Fear of current or ex partner: Not on file    Emotionally abused: Not on file    Physically abused: Not on file    Forced sexual activity: Not on file  Other Topics Concern   Not on file  Social History Narrative   Not on file    Review of Systems: All systems reviewed and negative except where noted in HPI.  Physical Exam: Vital signs in last 24 hours: Temp:  [98 F (36.7 C)-99.1 F (37.3 C)] 98.8 F (37.1 C) (11/27 1121) Pulse Rate:  [69-85] 72 (11/27  1121) Resp:  [17-22] 17 (11/27 1121) BP: (115-142)/(34-60) 142/48 (11/27 1121) SpO2:  [100 %] 100 % (11/27 1121) Weight:  [73.8 kg] 73.8 kg (11/27 0800) Last BM Date: 07/28/19 General:   Alert, well-developed,  female in NAD Psych:  Pleasant, cooperative. Normal mood and affect. Eyes:  Pupils equal, sclera clear, no icterus.   Conjunctiva pink. Ears:  Normal auditory acuity. Nose:  No deformity, discharge,  or lesions. Neck:  Supple; no masses Lungs:  Clear throughout to auscultation.   No wheezes, crackles, or rhonchi.  Heart:  Regular rate and rhythm; + murmurs, no lower extremity edema Abdomen:  Soft, non-distended, nontender, BS active, no palp mass   Rectal:  Deferred  Msk:  Symmetrical without gross deformities. . Neurologic:  Alert and  oriented x4;  grossly normal neurologically. Skin:  Intact  without significant lesions or rashes.   Intake/Output from previous day: 11/26 0701 - 11/27 0700 In: 231.4 [IV Piggyback:231.4] Out: 200 [Urine:200] Intake/Output this shift: Total I/O In: 1142 [P.O.:840; Blood:302] Out: 900 [Urine:900]  Lab Results: Recent Labs    07/28/19 2151 07/28/19 2354 07/29/19 0515 07/29/19 1234  WBC 11.6*  --   --   --   HGB 7.7* 7.3* 6.7* 7.5*  HCT 24.3* 22.5* 20.7* 22.4*  PLT 201  --   --   --    BMET Recent Labs    07/28/19 2151 07/29/19 0515  NA 135 135  K 4.2 4.1  CL 97* 99  CO2 24 26  GLUCOSE 179* 123*  BUN 63* 55*  CREATININE 1.09* 0.80  CALCIUM 8.9 8.6*   LFT Recent Labs    07/28/19 2151  PROT 5.7*  ALBUMIN 3.5  AST 19  ALT 14  ALKPHOS 36*  BILITOT 0.5   PT/INR Recent Labs    07/28/19 2151  LABPROT 13.5  INR 1.0   Hepatitis Panel No results for input(s): HEPBSAG, HCVAB, HEPAIGM, HEPBIGM in the last 72 hours.   . CBC Latest Ref Rng & Units 07/29/2019 07/29/2019 07/28/2019  WBC 4.0 - 10.5 K/uL - - -  Hemoglobin 12.0 - 15.0 g/dL 7.5(L) 6.7(LL) 7.3(L)  Hematocrit 36.0 - 46.0 % 22.4(L) 20.7(L) 22.5(L)   Platelets 150 - 400 K/uL - - -    . CMP Latest Ref Rng & Units 07/29/2019 07/28/2019 07/18/2019  Glucose 70 - 99 mg/dL 123(H) 179(H) 121(H)  BUN 8 - 23 mg/dL 55(H) 63(H) 28(H)  Creatinine 0.44 - 1.00 mg/dL 0.80 1.09(H) 0.85  Sodium 135 - 145 mmol/L 135 135 136  Potassium 3.5 - 5.1 mmol/L 4.1 4.2 4.8  Chloride 98 - 111 mmol/L 99 97(L) 98  CO2 22 - 32 mmol/L 26 24 25   Calcium 8.9 - 10.3 mg/dL 8.6(L) 8.9 9.3  Total Protein 6.5 - 8.1 g/dL - 5.7(L) 6.3  Total Bilirubin 0.3 - 1.2 mg/dL - 0.5 0.2  Alkaline Phos 38 - 126 U/L - 36(L) 51  AST 15 - 41 U/L - 19 22  ALT 0 - 44 U/L - 14 13   Studies/Results: Dg Chest 2 View  Result Date: 07/28/2019 CLINICAL DATA:  Shortness of breath EXAM: CHEST - 2 VIEW COMPARISON:  Cardiac CT 06/03/2019, radiograph 05/13/2019 FINDINGS: Chronically coarsened interstitial opacities are similar to prior. No consolidation, features of edema, pneumothorax, or effusion. The aorta is calcified. The remaining cardiomediastinal contours are unremarkable. Pacer pack overlies the left chest wall with leads at the cardiac apex and right atrium. Cervical fusion hardware is noted. Degenerative changes are present in the imaged spine and shoulders. Dextrocurvature of the spine is similar to prior. IMPRESSION: 1. Stable chronic interstitial changes. No superimposed acute cardiopulmonary process. 2.  Aortic Atherosclerosis (ICD10-I70.0). Electronically Signed   By: Lovena Le M.D.   On: 07/28/2019 22:07    Principal Problem:   Symptomatic anemia Active Problems:   Chronic combined systolic and diastolic CHF (congestive heart failure) (H[facility])   COPD GOLD II   Diabetes mellitus type 2, controlled, without complications ([place])   Mild renal insufficiency   Pressure injury of skin    Tye Savoy, NP-C @  07/29/2019, 1:41 PM

## 2019-07-29 NOTE — H&P (Signed)
History and Physical    [name] [id number] DOB: 1941/02/13 DOA: 07/28/2019  PCP: Mina Marble D, NP   Patient coming from: Home   Chief Complaint: Fatigue, generalized weakness, melena   HPI: [name] is a 78 y.o. female with medical history significant for COPD, chronic hypoxic respiratory failure, complete heart block with pacer, chronic combined systolic and diastolic CHF, type 2 diabetes mellitus, and recent dark stool with anemia, now presenting to the emergency department for evaluation of increased fatigue, generalized weakness, and near syncope.  Patient is accompanied by her daughter who assists with the history.  Patient was experiencing increased fatigue and generalized weakness today, went to her daughter's house for Thanksgiving dinner, continued to feel poorly, and developed nausea and presyncope.  She had been experiencing dark stools for couple months and had a drop in her hemoglobin from 12.4 down to 10.4 in September 2020.  She had fecal occult blood testing that was positive x3.  She was referred to gastroenterology, hemoglobin had improved, and given her comorbidity that poses a risk for endoscopy, she was being monitored.  She had been using NSAIDs daily for her chronic arthritic pains, but now limits her analgesics to Tylenol.    ED Course: Upon arrival to the ED, patient is found to be afebrile, saturating well on room air, and with stable blood pressure and normal heart rate.  EKG features in a paced rhythm.  Chest x-ray is negative for acute cardiopulmonary disease.  Chemistry panel is notable for a BUN of 63 and creatinine 1.09, up from 0.85 earlier this month.  CBC is notable for leukocytosis to 11,600 and a normocytic anemia with hemoglobin 7.7, down from 11.6 ten days earlier.  Patient was given 250 cc normal saline and 40 mg IV Protonix in the ED, type and screen was performed, and COVID-19 screening test has not yet resulted.  Review of Systems:  All  other systems reviewed and apart from HPI, are negative.  Past Medical History:  Diagnosis Date  . Arthritis    "hands, spine" (12/07/2014)  . CHB (complete heart block) ([place]) 09/19/2013  . CHF (congestive heart failure) ([place]) dx'd 12/06/2014  . Chronic pain    "since neck OR in 2007"  . Coronary artery disease    normal coronaries by 09/10/00 cath  . Diabetes mellitus    hga1c 7.1 no meds  . Dysrhythmia   . HTN (hypertension) 09/19/2013  . Hypercholesterolemia   . Hyperlipidemia 09/19/2013  . Hypertension   . Myocardial infarction ([place]) dx'd 2009  . Neuropathy of both feet   . Pacemaker   . Paralysis of right hand ([place]) 2007   "after spinal cord injury/OR"  . Shortness of breath    exertional  . Type II diabetes mellitus ([place])    "had it years ago; lost weight after neck OR in 2007; glucose went way down; recently dx'd as returned recently" (12/07/2014)  . Urinary incontinence    "since 2007's OR"  . Venous insufficiency (chronic) (peripheral)     Past Surgical History:  Procedure Laterality Date  . ANTERIOR CERVICAL DECOMP/DISCECTOMY FUSION  2007  . BACK SURGERY    . CARDIAC CATHETERIZATION  09/10/2000   normal Coronary arteries, EF60%,   . CARDIAC PACEMAKER PLACEMENT  12/01/2007   [employer] Elta [place] [id number], dual chamber  . CARDIOVASCULAR STRESS TEST  08/28/2000   Cardiolite perfusion study, EF58%, low risk study,   . DILATION AND CURETTAGE OF UTERUS  1960's  . KNEE  ARTHROSCOPY Right   . LAPAROSCOPIC CHOLECYSTECTOMY  1995  . LEFT AND RIGHT HEART CATHETERIZATION WITH CORONARY ANGIOGRAM N/A 12/14/2014   Procedure: LEFT AND RIGHT HEART CATHETERIZATION WITH CORONARY ANGIOGRAM;  Surgeon: Peter M Martinique, MD;  Location: [facility] CATH LAB;  Service: Cardiovascular;  Laterality: N/A;  . Lower Ext. Dopplers  06/12/2011   no evidence of thrombus, all vessels normal in size, no evidence of insuffiency  . LUMBAR LAMINECTOMY/DECOMPRESSION MICRODISCECTOMY  03/02/2012   Procedure: LUMBAR  LAMINECTOMY/DECOMPRESSION MICRODISCECTOMY 2 LEVELS;  Surgeon: Floyce Stakes, MD;  Location: [facility] NEURO ORS;  Service: Neurosurgery;  Laterality: Bilateral;  Lumbar three, lumbar four, lumbar five Laminectomy/Foraminotomy  . MOLE REMOVAL     left side of face near her eye  . NM MYOCAR PERF WALL MOTION  10/24/2008   protocol:Lexiscan, EF63%, normal study, scan negative for ischemia  . PPM GENERATOR CHANGEOUT N/A 05/26/2018   Procedure: PPM GENERATOR CHANGEOUT;  Surgeon: Sanda Klein, MD;  Location: [facility] CV LAB;  Service: Cardiovascular;  Laterality: N/A;  . TRANSTHORACIC ECHOCARDIOGRAM  11/30/2007   normal echo, mean transaortic valve gradient 76mmHg     reports that she quit smoking about 25 years ago. Her smoking use included cigarettes. She started smoking about 57 years ago. She has a 16.00 pack-year smoking history. She has never used smokeless tobacco. She reports that she does not drink alcohol or use drugs.  No Known Allergies  Family History  Problem Relation Age of Onset  . Diabetes Father   . Heart disease Father        before age 49  . Hyperlipidemia Father   . Hypertension Father   . Heart attack Father   . Congestive Heart Failure Father   . Diabetes Mother   . Heart disease Mother   . Hyperlipidemia Mother   . Hypertension Mother   . Congestive Heart Failure Mother   . Diabetes Brother   . Congestive Heart Failure Brother   . Colon cancer Neg Hx   . Esophageal cancer Neg Hx   . Rectal cancer Neg Hx      Prior to Admission medications   Medication Sig Start Date End Date Taking? Authorizing Provider  acetaminophen (TYLENOL) 500 MG tablet Take 500 mg by mouth 4 (four) times daily -  with meals and at bedtime.    [provider]  aspirin EC 81 MG tablet Take 81 mg by mouth daily.    [provider]  DULoxetine (CYMBALTA) 20 MG capsule TAKE 1 CAPSULE (20 MG TOTAL) BY MOUTH DAILY. OFFICE VISIT REQUIRED PRIOR TO ANY FURTHER REFILLS 06/16/19    Danford, Valetta Fuller D, NP  furosemide (LASIX) 40 MG tablet Take 1 tablet (40 mg total) by mouth daily. May take an extra tablet for weight gain of 2 lb in a day and 5 lbs in a weeks. 05/20/19   Kroeger, Lorelee Cover., PA-C  ibuprofen (ADVIL,MOTRIN) 200 MG tablet Take 200 mg by mouth every 6 (six) hours as needed.    [provider]  losartan (COZAAR) 50 MG tablet Take 1 tablet (50 mg total) by mouth daily. 06/16/19   Danford, Valetta Fuller D, NP  meloxicam (MOBIC) 15 MG tablet Take 15 mg by mouth daily.    [provider]  metFORMIN (GLUCOPHAGE) 500 MG tablet 1/2 tablet nightly with dinner 10/20/18   Danford, Valetta Fuller D, NP  metoprolol succinate (TOPROL-XL) 25 MG 24 hr tablet Take 0.5 tablets (12.5 mg total) by mouth daily. Take with or immediately following a  meal. 05/20/19   Kroeger, Lorelee Cover., PA-C  OXYGEN 2lpm with sleep and exertion [employer]    [provider]  potassium chloride SA (KLOR-CON M20) 20 MEQ tablet Take 1 tablet (20 mEq total) by mouth daily. 05/20/19   Kroeger, Lorelee Cover., PA-C  pravastatin (PRAVACHOL) 40 MG tablet TAKE 1 TABLET BY MOUTH EVERY DAY 07/04/19   Mina Marble D, NP  Respiratory Therapy Supplies (FLUTTER) DEVI Use flutter device three times a day as instructed 05/13/19   Parrett, Tammy S, NP  Tiotropium Bromide-Olodaterol (STIOLTO RESPIMAT) 2.5-2.5 MCG/ACT AERS Inhale 2 puffs into the lungs daily. 05/06/19   Melvenia Needles, NP    Physical Exam: Vitals:   07/28/19 2131 07/28/19 2330 07/28/19 2343  BP: (!) 129/56  115/60  Pulse: 84 72 70  Resp: 20  19  Temp: 98.7 F (37.1 C)    TempSrc: Oral    SpO2: 100% 100% 100%    Constitutional: NAD, calm  Eyes: PERTLA, lids and conjunctivae normal ENMT: Mucous membranes are moist. Posterior pharynx clear of any exudate or lesions.   Neck: normal, supple, no masses, no thyromegaly Respiratory: no wheezing, no crackles. Normal respiratory effort. No accessory muscle use.  Cardiovascular: S1 & S2 heard, regular rate and  rhythm. No extremity edema.   Abdomen: No distension, no tenderness, soft. Bowel sounds normal.  Musculoskeletal: no clubbing / cyanosis. No joint deformity upper and lower extremities.  Skin: no significant rashes, lesions, ulcers. Warm, dry, well-perfused. Neurologic: No facial asymmetry. Sensation intact. Moving all extremities.  Psychiatric: Alert and oriented to person, place, and situation. Pleasant, cooperative.     Labs on Admission: I have personally reviewed following labs and imaging studies  CBC: Recent Labs  Lab 07/28/19 2151  WBC 11.6*  NEUTROABS 9.1*  HGB 7.7*  HCT 24.3*  MCV 94.9  PLT 123456   Basic Metabolic Panel: Recent Labs  Lab 07/28/19 2151  NA 135  K 4.2  CL 97*  CO2 24  GLUCOSE 179*  BUN 63*  CREATININE 1.09*  CALCIUM 8.9   GFR: CrCl cannot be calculated (Unknown ideal weight.). Liver Function Tests: Recent Labs  Lab 07/28/19 2151  AST 19  ALT 14  ALKPHOS 36*  BILITOT 0.5  PROT 5.7*  ALBUMIN 3.5   No results for input(s): LIPASE, AMYLASE in the last 168 hours. No results for input(s): AMMONIA in the last 168 hours. Coagulation Profile: Recent Labs  Lab 07/28/19 2151  INR 1.0   Cardiac Enzymes: No results for input(s): CKTOTAL, CKMB, CKMBINDEX, TROPONINI in the last 168 hours. BNP (last 3 results) Recent Labs    10/13/18 1629 05/05/19 1720  PROBNP 577.0* 1,161.0*   HbA1C: No results for input(s): HGBA1C in the last 72 hours. CBG: No results for input(s): GLUCAP in the last 168 hours. Lipid Profile: No results for input(s): CHOL, HDL, LDLCALC, TRIG, CHOLHDL, LDLDIRECT in the last 72 hours. Thyroid Function Tests: No results for input(s): TSH, T4TOTAL, FREET4, T3FREE, THYROIDAB in the last 72 hours. Anemia Panel: No results for input(s): VITAMINB12, FOLATE, FERRITIN, TIBC, IRON, RETICCTPCT in the last 72 hours. Urine analysis:    Component Value Date/Time   COLORURINE YELLOW 07/01/2018 1030   APPEARANCEUR CLOUDY (A)  07/01/2018 1030   LABSPEC >1.046 (H) 07/01/2018 1030   PHURINE 9.0 (H) 07/01/2018 1030   GLUCOSEU NEGATIVE 07/01/2018 1030   HGBUR NEGATIVE 07/01/2018 [place] 07/01/2018 1030   KETONESUR 20 (A) 07/01/2018 [place] 07/01/2018 1030  UROBILINOGEN 0.2 12/06/2014 1642   NITRITE NEGATIVE 07/01/2018 1030   LEUKOCYTESUR NEGATIVE 07/01/2018 1030   Sepsis Labs: @LABRCNTIP (procalcitonin:4,lacticidven:4) )No results found for this or any previous visit (from the past 240 hour(s)).   Radiological Exams on Admission: Dg Chest 2 View  Result Date: 07/28/2019 CLINICAL DATA:  Shortness of breath EXAM: CHEST - 2 VIEW COMPARISON:  Cardiac CT 06/03/2019, radiograph 05/13/2019 FINDINGS: Chronically coarsened interstitial opacities are similar to prior. No consolidation, features of edema, pneumothorax, or effusion. The aorta is calcified. The remaining cardiomediastinal contours are unremarkable. Pacer pack overlies the left chest wall with leads at the cardiac apex and right atrium. Cervical fusion hardware is noted. Degenerative changes are present in the imaged spine and shoulders. Dextrocurvature of the spine is similar to prior. IMPRESSION: 1. Stable chronic interstitial changes. No superimposed acute cardiopulmonary process. 2.  Aortic Atherosclerosis (ICD10-I70.0). Electronically Signed   By: Lovena Le M.D.   On: 07/28/2019 22:07    EKG: Independently reviewed. Atrially-sensed, ventricular-pacing.   Assessment/Plan   1. Upper GI bleeding; symptomatic anemia  - Presents with fatigue, near-syncope, and melena, and is found to have Hgb of 7.7, down from 11.6 on 07/18/19 with BUN 63 (up from 28 on 11/16)  - BP and HR have remained stable in ED  - She was given a 250 cc NS bolus and IV Protonix in ED  - Continue bowel-rest, continue IV PPI, type & screen, trend H&H, hold ASA    2. Mild renal insufficiency  - SCr is 1.09 on admission, up from 0.85 earlier this  month  - She was given a 250 cc NS bolus in ED  - Hold Lasix and ARB initially, repeat chem panel in am    3. COPD; chronic hypoxic respiratory failure  - No cough or wheezing on admission  - She uses 2 Lpm supplemental O2 at home  - Continue inhalers    4. Chronic combined systolic & diastolic CHF  - Appears compensated  - EF was 45-50% in September [id number] with diastolic dysfunction, severe LAE, and moderate TR  - She was given 250 cc NS in ED in light of near-syncope  - Hold Lasix and ARB as above, monitor weights    5. Type II DM  - A1c was 6.2% in November 2020  - Managed with Metformin at home, held on admission  - Check CBG's and use low-intensity SSI with Novolog as needed     PPE: Mask, face shield  DVT prophylaxis: SCD's  Code Status: Full  Family Communication: Daughter updated at bedside  Consults called: None  Admission status: Observation     Vianne Bulls, MD [facility] Pager [phone]  If 7PM-7AM, please contact night-coverage [email or web address] Password [facility]  07/29/2019, 12:37 AM

## 2019-07-29 NOTE — Interval H&P Note (Signed)
History and Physical Interval Note:  07/29/2019 2:52 PM  [name]  has presented today for surgery, with the diagnosis of Melena.  The various methods of treatment have been discussed with the patient and family. After consideration of risks, benefits and other options for treatment, the patient has consented to  Procedure(s): ESOPHAGOGASTRODUODENOSCOPY (EGD) (N/A) as a surgical intervention.  The patient's history has been reviewed, patient examined, no change in status, stable for surgery.  I have reviewed the patient's chart and labs.  Questions were answered to the patient's satisfaction.     Pricilla Riffle. Fuller Plan

## 2019-07-29 NOTE — Transfer of Care (Signed)
Immediate Anesthesia Transfer of Care Note  Patient: [name]  Procedure(s) Performed: ESOPHAGOGASTRODUODENOSCOPY (EGD) (N/A ) HOT HEMOSTASIS (ARGON PLASMA COAGULATION/BICAP) (N/A ) BIOPSY  Patient Location: Endoscopy Unit  Anesthesia Type:MAC  Level of Consciousness: awake, alert  and oriented  Airway & Oxygen Therapy: Patient Spontanous Breathing and Patient connected to nasal cannula oxygen  Post-op Assessment: Report given to RN, Post -op Vital signs reviewed and stable and Patient moving all extremities  Post vital signs: Reviewed and stable  Last Vitals:  Vitals Value Taken Time  BP 126/34 07/29/19 1538  Temp 36.7 C 07/29/19 1538  Pulse 68 07/29/19 1538  Resp 21 07/29/19 1538  SpO2 100 % 07/29/19 1538  Vitals shown include unvalidated device data.  Last Pain:  Vitals:   07/29/19 1538  TempSrc: Temporal  PainSc: 0-No pain         Complications: No apparent anesthesia complications

## 2019-07-29 NOTE — Anesthesia Postprocedure Evaluation (Signed)
Anesthesia Post Note  Patient: [name]  Procedure(s) Performed: ESOPHAGOGASTRODUODENOSCOPY (EGD) (N/A ) HOT HEMOSTASIS (ARGON PLASMA COAGULATION/BICAP) (N/A ) BIOPSY     Patient location during evaluation: PACU Anesthesia Type: MAC Level of consciousness: awake and alert Pain management: pain level controlled Vital Signs Assessment: post-procedure vital signs reviewed and stable Respiratory status: spontaneous breathing, nonlabored ventilation, respiratory function stable and patient connected to nasal cannula oxygen Cardiovascular status: stable and blood pressure returned to baseline Postop Assessment: no apparent nausea or vomiting Anesthetic complications: no    Last Vitals:  Vitals:   07/29/19 1550 07/29/19 1555  BP: (!) 129/41 (!) 135/35  Pulse: 72 74  Resp: (!) 24 (!) 24  Temp:    SpO2: 100% 100%    Last Pain:  Vitals:   07/29/19 1555  TempSrc:   PainSc: 0-No pain                 Effie Berkshire

## 2019-07-30 DIAGNOSIS — K921 Melena: Secondary | ICD-10-CM

## 2019-07-30 DIAGNOSIS — K269 Duodenal ulcer, unspecified as acute or chronic, without hemorrhage or perforation: Secondary | ICD-10-CM

## 2019-07-30 LAB — CBC
HCT: 21 % — ABNORMAL LOW (ref 36.0–46.0)
HCT: 25.5 % — ABNORMAL LOW (ref 36.0–46.0)
Hemoglobin: 6.8 g/dL — CL (ref 12.0–15.0)
Hemoglobin: 8.3 g/dL — ABNORMAL LOW (ref 12.0–15.0)
MCH: 29.8 pg (ref 26.0–34.0)
MCH: 29.4 pg (ref 26.0–34.0)
MCHC: 32.4 g/dL (ref 30.0–36.0)
MCHC: 32.5 g/dL (ref 30.0–36.0)
MCV: 92.1 fL (ref 80.0–100.0)
MCV: 90.4 fL (ref 80.0–100.0)
Platelets: 140 10*3/uL — ABNORMAL LOW (ref 150–400)
Platelets: 137 10*3/uL — ABNORMAL LOW (ref 150–400)
RBC: 2.28 MIL/uL — ABNORMAL LOW (ref 3.87–5.11)
RBC: 2.82 MIL/uL — ABNORMAL LOW (ref 3.87–5.11)
RDW: 14.6 % (ref 11.5–15.5)
RDW: 15.1 % (ref 11.5–15.5)
WBC: 7.2 10*3/uL (ref 4.0–10.5)
WBC: 6.2 10*3/uL (ref 4.0–10.5)
nRBC: 0 % (ref 0.0–0.2)
nRBC: 0 % (ref 0.0–0.2)

## 2019-07-30 LAB — PREPARE RBC (CROSSMATCH)

## 2019-07-30 LAB — BASIC METABOLIC PANEL
Anion gap: 10 (ref 5–15)
BUN: 16 mg/dL (ref 8–23)
CO2: 26 mmol/L (ref 22–32)
Calcium: 8.5 mg/dL — ABNORMAL LOW (ref 8.9–10.3)
Chloride: 105 mmol/L (ref 98–111)
Creatinine, Ser: 0.49 mg/dL (ref 0.44–1.00)
GFR calc Af Amer: >60 mL/min (ref >60)
GFR calc non Af Amer: >60 mL/min (ref >60)
Glucose, Bld: 107 mg/dL — ABNORMAL HIGH (ref 70–99)
Potassium: 3.8 mmol/L (ref 3.5–5.1)
Sodium: 141 mmol/L (ref 135–145)

## 2019-07-30 LAB — GLUCOSE, CAPILLARY
Glucose-Capillary: 110 mg/dL — ABNORMAL HIGH (ref 70–99)
Glucose-Capillary: 116 mg/dL — ABNORMAL HIGH (ref 70–99)
Glucose-Capillary: 123 mg/dL — ABNORMAL HIGH (ref 70–99)
Glucose-Capillary: 131 mg/dL — ABNORMAL HIGH (ref 70–99)
Glucose-Capillary: 133 mg/dL — ABNORMAL HIGH (ref 70–99)
Glucose-Capillary: 138 mg/dL — ABNORMAL HIGH (ref 70–99)
Glucose-Capillary: 142 mg/dL — ABNORMAL HIGH (ref 70–99)

## 2019-07-30 MED ORDER — FUROSEMIDE 40 MG PO TABS
40.0000 mg | ORAL_TABLET | Freq: Every day | ORAL | Status: DC
  Administered 2019-07-31 – 2019-08-01 (×2): 40 mg via ORAL
  Filled 2019-07-30 (×2): qty 1

## 2019-07-30 MED ORDER — PANTOPRAZOLE SODIUM 40 MG PO TBEC
40.0000 mg | DELAYED_RELEASE_TABLET | Freq: Every day | ORAL | Status: DC
  Administered 2019-07-30 – 2019-07-31 (×2): 40 mg via ORAL
  Filled 2019-07-30 (×2): qty 1

## 2019-07-30 MED ORDER — SODIUM CHLORIDE 0.9% IV SOLUTION
Freq: Once | INTRAVENOUS | Status: AC
  Administered 2019-07-30: 12:00:00 via INTRAVENOUS

## 2019-07-30 NOTE — Plan of Care (Signed)
Poc progressing.  

## 2019-07-30 NOTE — Progress Notes (Signed)
Patient sleeping during shift report.      

## 2019-07-30 NOTE — Progress Notes (Addendum)
Cross cover-[facility]-GI Subjective: [name] is a 78 year old white female with a history of COPD chronic hypoxic respiratory failure complete heart block with a pacer combined systolic and diastolic congestive heart failure type 2 diabetes who was admitted to the hospital through the emergency room with melena and anemia and underwent endoscopy yesterday that revealed 2 duodenal ulcers with visible vessels.  Patient admits to taking nonsteroidals for knee pain.  She also has a prescription for Meloxicam. she denies having any nausea vomiting or abdominal pain at this time her last bowel movement was yesterday she is tolerating A clear liquid diet well.  Anxious to go home.  Her hemoglobin is down from 7.8 g/dL yesterday to 6.8 g/dL today.  Objective: Vital signs in last 24 hours: Temp:  [97.3 F (36.3 C)-99.2 F (37.3 C)] 97.3 F (36.3 C) (11/28 0151) Pulse Rate:  [68-88] 78 (11/28 0151) Resp:  [14-24] 20 (11/28 0151) BP: (120-149)/(34-75) 134/45 (11/28 0151) SpO2:  [100 %] 100 % (11/28 0151) Weight:  [73.1 kg] 73.1 kg (11/28 0151) Last BM Date: (PTA)  Intake/Output from previous day: 11/27 0701 - 11/28 0700 In: 1442 [P.O.:840; I.V.:300; Blood:302] Out: 2925 [Urine:2925] Intake/Output this shift: Total I/O In: 337.3 [I.V.:337.3] Out: -   General appearance: alert, cooperative, appears stated age, fatigued, no distress and pale Resp: clear to auscultation bilaterally; pacemaker is palpable subcutaneously on the left chest Cardio: regular rate and rhythm, S1, S2 normal, no murmur, click, rub or gallop; pacemaker palpable on left chest GI: soft, non-tender; bowel sounds normal; no masses,  no organomegaly Extremities: extremities normal, atraumatic, no cyanosis or edema  Lab Results: Recent Labs    07/28/19 2151  07/29/19 1234 07/29/19 2036 07/30/19 0419  WBC 11.6*  --   --  8.2 7.2  HGB 7.7*   < > 7.5* 7.8* 6.8*  HCT 24.3*   < > 22.4* 23.0* 21.0*  PLT 201  --   --  147*  140*   < > = values in this interval not displayed.   BMET Recent Labs    07/28/19 2151 07/29/19 0515 07/30/19 0419  NA 135 135 141  K 4.2 4.1 3.8  CL 97* 99 105  CO2 24 26 26   GLUCOSE 179* 123* 107*  BUN 63* 55* 16  CREATININE 1.09* 0.80 0.49  CALCIUM 8.9 8.6* 8.5*   LFT Recent Labs    07/28/19 2151  PROT 5.7*  ALBUMIN 3.5  AST 19  ALT 14  ALKPHOS 36*  BILITOT 0.5   PT/INR Recent Labs    07/28/19 2151  LABPROT 13.5  INR 1.0   Studies/Results: Dg Chest 2 View  Result Date: 07/28/2019 CLINICAL DATA:  Shortness of breath EXAM: CHEST - 2 VIEW COMPARISON:  Cardiac CT 06/03/2019, radiograph 05/13/2019 FINDINGS: Chronically coarsened interstitial opacities are similar to prior. No consolidation, features of edema, pneumothorax, or effusion. The aorta is calcified. The remaining cardiomediastinal contours are unremarkable. Pacer pack overlies the left chest wall with leads at the cardiac apex and right atrium. Cervical fusion hardware is noted. Degenerative changes are present in the imaged spine and shoulders. Dextrocurvature of the spine is similar to prior. IMPRESSION: 1. Stable chronic interstitial changes. No superimposed acute cardiopulmonary process. 2.  Aortic Atherosclerosis (ICD10-I70.0). Electronically Signed   By: Lovena Le M.D.   On: 07/28/2019 22:07   Medications: I have reviewed the patient's current medications.  Assessment/Plan: 1) Large duodenal ulcers on nonsteroidals presenting with melena and ABLA currently stable. As per my discussion  with the patient's nurse we will advance her  to a low residue diet and monitor hemoglobin closely before discharging her.  We will also change her from IV Protonix to p.o. Protonix. Patient should be educated about the use of all nonsteroidals including prescription drugs like meloxicam prior to discharge 2) COPD/chronic hypoxic respiratory failure on home oxygen. 3) chronic combined systolic and diastolic heart  failure. 4) Adult onset diabetes mellitus. 5) History of adenomatous polyps.  LOS: 1 day   Juanita Craver 07/30/2019, 8:00 AM

## 2019-07-30 NOTE — Progress Notes (Signed)
Pt Hb is 6.8. Provider notified.

## 2019-07-30 NOTE — Progress Notes (Signed)
PROGRESS NOTE  [name] [id number] DOB: 1941/07/02 DOA: 07/28/2019 PCP: Esaw Grandchild, NP  HPI/Recap of past 24 hours: HPI from Dr Letitia Neri is a 78 y.o. female with medical history significant for COPD, chronic hypoxic respiratory failure, complete heart block with pacer, chronic combined systolic and diastolic CHF, type 2 diabetes mellitus, and recent dark stool with anemia, now presenting to the ED for evaluation of increased fatigue, generalized weakness, and near syncope.  Patient was accompanied by her daughter who assists with the history. Patient was experiencing increased fatigue and generalized weakness, went to her daughter's house for Thanksgiving dinner, continued to feel poorly, and developed nausea and presyncope.  She had been experiencing dark stools for couple months and had a drop in her hemoglobin from 12.4 down to 10.4 in September 2020.  She had fecal occult blood testing that was positive x3. She had been using NSAIDs daily for her chronic arthritic pains, but now limits her analgesics to Tylenol. In the ED, patient is found to be stable, labs showed BUN of 63 and creatinine 1.09, hemoglobin 7.7, down from 11.6 ten days earlier. Patient was given 250 cc normal saline and 40 mg IV Protonix in the ED, type and screen was performed, and COVID-19 test negative.    Today, patient reports feeling slightly better, denies any obvious signs of bleeding, shortness of breath, chest pain, abdominal pain, nausea/vomiting/diarrhea, fever/chills, melena.  Patient requesting to have portable oxygen concentrator, she is currently on 24 hours oxygen and has to pull her oxygen tank around which could be an inconvenience.     Assessment/Plan: Principal Problem:   Symptomatic anemia Active Problems:   Chronic combined systolic and diastolic CHF (congestive heart failure) (HCC)   COPD GOLD II   Diabetes mellitus type 2, controlled, without complications (HCC)   Mild  renal insufficiency   Pressure injury of skin   Melena  Upper GI bleeding due to possible duodenal ulcers/gastritis  Melena, generalized weakness, drop in hemoglobin, NSAID use FOBT positive GI consulted, EGD done showed gastritis, biopsied.  Also showed 2 nonbleeding duodenal ulcers each with a visible vessel treated with bipolar cautery.  Recommend advancing diet to soft, continuing PPI Discussed extensively with patient on the need to not take NSAIDs (listed them all up for her), and to continue with Tylenol for her pain, patient verbalized understanding  Acute on chronic blood loss/symptomatic anemia New baseline around 11 Hemoglobin with significant drop, down to 6.7 Transfused 1 unit of PRBC on 07/29/2019, transfused a second unit on 07/30/2019 Frequent H&H checks Management as above  COPD/chronic hypoxic respiratory failure  Stable, on home O2, 2 L at baseline Continue inhalers Requesting portable oxygen concentrator, case manager notified  Chronic combined systolic & diastolic CHF  Appears compensated  EF was 45-50% in September [id number] with diastolic dysfunction, severe LAE, and moderate TR  Restart home Lasix, continue to hold ARB  Type II DM  A1c was 6.2% in November 2020  SSI, hypoglycemic protocol, Accu-Cheks Hold home Metformin  Complete heart block status post pacer Stable          Malnutrition Type:  Nutrition Problem: Inadequate oral intake Etiology: altered GI function   Malnutrition Characteristics:  Signs/Symptoms: NPO status   Nutrition Interventions:  Interventions: Boost Breeze, MVI    Estimated body mass index is 25.23 kg/m as calculated from the following:   Height as of this encounter: 5\' 7"  (1.702 m).   Weight as of this encounter: 73.1  kg.     Code Status: Full  Family Communication: Spoke with daughter on 07/29/2019  Disposition Plan: To be determined   Consultants:  GI  Procedures:  EGD on 07/29/2019   Antimicrobials:  None  DVT prophylaxis: SCD   Objective: Vitals:   07/30/19 0848 07/30/19 0915 07/30/19 1300 07/30/19 1630  BP: (!) 125/45 (!) 136/46 (!) 120/46 (!) 139/57  Pulse: 66 66 66 72  Resp: 18 18 18  (!) 22  Temp: 97.9 F (36.6 C) 98.1 F (36.7 C) 97.7 F (36.5 C) 98.3 F (36.8 C)  TempSrc: Oral Oral Oral Oral  SpO2: 100% 100% 99% 100%  Weight:      Height:        Intake/Output Summary (Last 24 hours) at 07/30/2019 1721 Last data filed at 07/30/2019 1706 Gross per 24 hour  Intake 1172.77 ml  Output 1825 ml  Net -652.23 ml   Filed Weights   07/29/19 0130 07/29/19 0800 07/30/19 0151  Weight: 73.8 kg 73.8 kg 73.1 kg    Exam:  General: NAD, elderly female  Cardiovascular: S1, S2 present  Respiratory: CTAB  Abdomen: Soft, nontender, nondistended, bowel sounds present  Musculoskeletal: No bilateral pedal edema noted  Skin: Normal  Psychiatry: Normal mood   Data Reviewed: CBC: Recent Labs  Lab 07/28/19 2151  07/29/19 0515 07/29/19 1234 07/29/19 2036 07/30/19 0419 07/30/19 1503  WBC 11.6*  --   --   --  8.2 7.2 6.2  NEUTROABS 9.1*  --   --   --   --   --   --   HGB 7.7*   < > 6.7* 7.5* 7.8* 6.8* 8.3*  HCT 24.3*   < > 20.7* 22.4* 23.0* 21.0* 25.5*  MCV 94.9  --   --   --  89.8 92.1 90.4  PLT 201  --   --   --  147* 140* 137*   < > = values in this interval not displayed.   Basic Metabolic Panel: Recent Labs  Lab 07/28/19 2151 07/29/19 0515 07/30/19 0419  NA 135 135 141  K 4.2 4.1 3.8  CL 97* 99 105  CO2 24 26 26   GLUCOSE 179* 123* 107*  BUN 63* 55* 16  CREATININE 1.09* 0.80 0.49  CALCIUM 8.9 8.6* 8.5*   GFR: Estimated Creatinine Clearance: 55.5 mL/min (by C-G formula based on SCr of 0.49 mg/dL). Liver Function Tests: Recent Labs  Lab 07/28/19 2151  AST 19  ALT 14  ALKPHOS 36*  BILITOT 0.5  PROT 5.7*  ALBUMIN 3.5   No results for input(s): LIPASE, AMYLASE in the last 168 hours. No results for input(s): AMMONIA in the  last 168 hours. Coagulation Profile: Recent Labs  Lab 07/28/19 2151  INR 1.0   Cardiac Enzymes: No results for input(s): CKTOTAL, CKMB, CKMBINDEX, TROPONINI in the last 168 hours. BNP (last 3 results) Recent Labs    10/13/18 1629 05/05/19 1720  PROBNP 577.0* 1,161.0*   HbA1C: No results for input(s): HGBA1C in the last 72 hours. CBG: Recent Labs  Lab 07/30/19 0044 07/30/19 0418 07/30/19 0802 07/30/19 1216 07/30/19 1627  GLUCAP 123* 110* 116* 142* 133*   Lipid Profile: No results for input(s): CHOL, HDL, LDLCALC, TRIG, CHOLHDL, LDLDIRECT in the last 72 hours. Thyroid Function Tests: No results for input(s): TSH, T4TOTAL, FREET4, T3FREE, THYROIDAB in the last 72 hours. Anemia Panel: No results for input(s): VITAMINB12, FOLATE, FERRITIN, TIBC, IRON, RETICCTPCT in the last 72 hours. Urine analysis:    Component Value Date/Time  COLORURINE YELLOW 07/01/2018 1030   APPEARANCEUR CLOUDY (A) 07/01/2018 1030   LABSPEC >1.046 (H) 07/01/2018 1030   PHURINE 9.0 (H) 07/01/2018 1030   GLUCOSEU NEGATIVE 07/01/2018 1030   HGBUR NEGATIVE 07/01/2018 1030   BILIRUBINUR NEGATIVE 07/01/2018 1030   KETONESUR 20 (A) 07/01/2018 1030   PROTEINUR NEGATIVE 07/01/2018 1030   UROBILINOGEN 0.2 12/06/2014 1642   NITRITE NEGATIVE 07/01/2018 1030   LEUKOCYTESUR NEGATIVE 07/01/2018 1030   Sepsis Labs: @LABRCNTIP (procalcitonin:4,lacticidven:4)  ) Recent Results (from the past 240 hour(s))  SARS CORONAVIRUS 2 (TAT 6-24 HRS) Nasopharyngeal Nasopharyngeal Swab     Status: None   Collection Time: 07/29/19 12:09 AM   Specimen: Nasopharyngeal Swab  Result Value Ref Range Status   SARS Coronavirus 2 NEGATIVE NEGATIVE Final    Comment: (NOTE) SARS-CoV-2 target nucleic acids are NOT DETECTED. The SARS-CoV-2 RNA is generally detectable in upper and lower respiratory specimens during the acute phase of infection. Negative results do not preclude SARS-CoV-2 infection, do not rule out co-infections  with other pathogens, and should not be used as the sole basis for treatment or other patient management decisions. Negative results must be combined with clinical observations, patient history, and epidemiological information. The expected result is Negative. Fact Sheet for Patients: SugarRoll.be Fact Sheet for Healthcare Providers: https://www.woods-mathews.com/ This test is not yet approved or cleared by the [place] FDA and  has been authorized for detection and/or diagnosis of SARS-CoV-2 by FDA under an Emergency Use Authorization (EUA). This EUA will remain  in effect (meaning this test can be used) for the duration of the COVID-19 declaration under Section 56 4(b)(1) of the Act, 21 U.S.C. section 360bbb-3(b)(1), unless the authorization is terminated or revoked sooner. Performed at [facility], [place] [address] [place], [place] [address]       Studies: No results found.  Scheduled Meds: . arformoterol  15 mcg Nebulization BID  . DULoxetine  20 mg Oral Daily  . feeding supplement  1 Container Oral TID BM  . insulin aspart  0-7 Units Subcutaneous Q4H  . mouth rinse  15 mL Mouth Rinse BID  . metoprolol succinate  12.5 mg Oral Daily  . pantoprazole  40 mg Oral Daily  . pravastatin  40 mg Oral Daily  . sodium chloride flush  3 mL Intravenous Q12H  . sodium chloride flush  3 mL Intravenous Q12H  . umeclidinium bromide  1 puff Inhalation Daily    Continuous Infusions: . sodium chloride Stopped (07/30/19 1134)     LOS: 1 day     Alma Friendly, MD [facility]  If 7PM-7AM, please contact night-coverage [email or web address] 07/30/2019, 5:21 PM

## 2019-07-30 NOTE — Progress Notes (Signed)
   Vital Signs MEWS/VS Documentation       07/30/2019 0820 07/30/2019 0848 07/30/2019 0915 07/30/2019 1300   MEWS Score:  --  0  0  0   MEWS Score Color:  --  Ailene Ards  Green   Resp:  --  18  18  18    Pulse:  --  66  66  66   BP:  --  (!) 125/45  (!) 136/46  (!) 120/46   Temp:  --  97.9 F (36.6 C)  98.1 F (36.7 C)  97.7 F (36.5 C)   O2 Device:  Nasal Cannula  Nasal Cannula  Nasal Cannula  Nasal Cannula   O2 Flow Rate (L/min):  2 L/min  2 L/min  2 L/min  2 L/min   FiO2 (%):  28 %  --  --  --            Maud Deed Tobias-Diakun 07/30/2019,2:30 PM

## 2019-07-31 ENCOUNTER — Encounter (HOSPITAL_COMMUNITY): Payer: Self-pay | Admitting: Gastroenterology

## 2019-07-31 LAB — BPAM RBC
Blood Product Expiration Date: 202012062359
Blood Product Expiration Date: 202012182359
ISSUE DATE / TIME: 202011270701
ISSUE DATE / TIME: 202011280845
Unit Type and Rh: 600
Unit Type and Rh: 9500

## 2019-07-31 LAB — CBC WITH DIFFERENTIAL/PLATELET
Abs Immature Granulocytes: 0.02 10*3/uL (ref 0.00–0.07)
Basophils Absolute: 0 10*3/uL (ref 0.0–0.1)
Basophils Relative: 1 %
Eosinophils Absolute: 0.1 10*3/uL (ref 0.0–0.5)
Eosinophils Relative: 1 %
HCT: 24.4 % — ABNORMAL LOW (ref 36.0–46.0)
Hemoglobin: 8.1 g/dL — ABNORMAL LOW (ref 12.0–15.0)
Immature Granulocytes: 0 %
Lymphocytes Relative: 37 %
Lymphs Abs: 2.8 10*3/uL (ref 0.7–4.0)
MCH: 30.1 pg (ref 26.0–34.0)
MCHC: 33.2 g/dL (ref 30.0–36.0)
MCV: 90.7 fL (ref 80.0–100.0)
Monocytes Absolute: 0.6 10*3/uL (ref 0.1–1.0)
Monocytes Relative: 8 %
Neutro Abs: 4 10*3/uL (ref 1.7–7.7)
Neutrophils Relative %: 53 %
Platelets: 144 10*3/uL — ABNORMAL LOW (ref 150–400)
RBC: 2.69 MIL/uL — ABNORMAL LOW (ref 3.87–5.11)
RDW: 15 % (ref 11.5–15.5)
WBC: 7.5 10*3/uL (ref 4.0–10.5)
nRBC: 0 % (ref 0.0–0.2)

## 2019-07-31 LAB — TYPE AND SCREEN
ABO/RH(D): AB NEG
Antibody Screen: NEGATIVE
Unit division: 0
Unit division: 0

## 2019-07-31 LAB — BASIC METABOLIC PANEL
Anion gap: 9 (ref 5–15)
BUN: 10 mg/dL (ref 8–23)
CO2: 28 mmol/L (ref 22–32)
Calcium: 8.8 mg/dL — ABNORMAL LOW (ref 8.9–10.3)
Chloride: 105 mmol/L (ref 98–111)
Creatinine, Ser: 0.62 mg/dL (ref 0.44–1.00)
GFR calc Af Amer: >60 mL/min (ref >60)
GFR calc non Af Amer: >60 mL/min (ref >60)
Glucose, Bld: 112 mg/dL — ABNORMAL HIGH (ref 70–99)
Potassium: 4.2 mmol/L (ref 3.5–5.1)
Sodium: 142 mmol/L (ref 135–145)

## 2019-07-31 LAB — GLUCOSE, CAPILLARY
Glucose-Capillary: 105 mg/dL — ABNORMAL HIGH (ref 70–99)
Glucose-Capillary: 101 mg/dL — ABNORMAL HIGH (ref 70–99)
Glucose-Capillary: 134 mg/dL — ABNORMAL HIGH (ref 70–99)
Glucose-Capillary: 177 mg/dL — ABNORMAL HIGH (ref 70–99)
Glucose-Capillary: 144 mg/dL — ABNORMAL HIGH (ref 70–99)

## 2019-07-31 MED ORDER — PANTOPRAZOLE SODIUM 40 MG IV SOLR
40.0000 mg | Freq: Two times a day (BID) | INTRAVENOUS | Status: DC
  Administered 2019-07-31 – 2019-08-01 (×3): 40 mg via INTRAVENOUS
  Filled 2019-07-31 (×3): qty 40

## 2019-07-31 MED ORDER — MUSCLE RUB 10-15 % EX CREA
TOPICAL_CREAM | CUTANEOUS | Status: DC | PRN
  Administered 2019-07-31 – 2019-08-01 (×2): 1 via TOPICAL
  Administered 2019-08-01: 11:00:00 via TOPICAL
  Filled 2019-07-31: qty 85

## 2019-07-31 NOTE — Progress Notes (Signed)
Nurse spoke with Benny Lennert, MD regarding patients diet order and medication orders. Education given to patient and patient's daughter on mechanical soft diet and medications. All questions and concerns answered. Patient currently lying in bed, call bell within reach, daughter at bedside.

## 2019-07-31 NOTE — Evaluation (Signed)
Physical Therapy Evaluation Patient Details Name: [name] MRN: [id number] DOB: [birth year] Today's Date: 07/31/2019   History of Present Illness  Pt adm with weakness and near syncope and found to have Hgb 7.7. EGD showed gastritis and nonbleeding duodenal ulcers. PMH - icd, chf, htn, dm, copd, acdf, neuropathy, MI, back surgery  Clinical Impression  Pt doing well with mobility and no further PT needed.  Ready for dc from PT standpoint.      Follow Up Recommendations No PT follow up    Equipment Recommendations  None recommended by PT    Recommendations for Other Services       Precautions / Restrictions Precautions Precautions: None Restrictions Weight Bearing Restrictions: No      Mobility  Bed Mobility Overal bed mobility: Modified Independent                Transfers Overall transfer level: Modified independent Equipment used: 4-wheeled walker                Ambulation/Gait Ambulation/Gait assistance: Modified independent (Device/Increase time) Gait Distance (Feet): 350 Feet Assistive device: 4-wheeled walker Gait Pattern/deviations: Step-through pattern   Gait velocity interpretation: 1.31 - 2.62 ft/sec, indicative of limited community ambulator General Gait Details: Steady gait  Stairs            Wheelchair Mobility    Modified Rankin (Stroke Patients Only)       Balance Overall balance assessment: Mild deficits observed, not formally tested                                           Pertinent Vitals/Pain Pain Assessment: No/denies pain    Home Living Family/patient expects to be discharged to:: Private residence Living Arrangements: Alone Available Help at Discharge: Family Type of Home: House Home Access: Stairs to enter Entrance Stairs-Rails: Right Entrance Stairs-Number of Steps: 5 Home Layout: One level Home Equipment: Environmental consultant - 2 wheels;Bedside commode;Wheelchair - manual;Grab bars -  tub/shower Additional Comments: Home O2 at 2L    Prior Function Level of Independence: Independent with assistive device(s)         Comments: amb with rollator     Hand Dominance   Dominant Hand: Right    Extremity/Trunk Assessment   Upper Extremity Assessment Upper Extremity Assessment: Overall WFL for tasks assessed    Lower Extremity Assessment Lower Extremity Assessment: Overall WFL for tasks assessed       Communication   Communication: No difficulties  Cognition Arousal/Alertness: Awake/alert Behavior During Therapy: WFL for tasks assessed/performed Overall Cognitive Status: Within Functional Limits for tasks assessed                                        General Comments General comments (skin integrity, edema, etc.): amb on 2L of O2 with SpO2 93%    Exercises     Assessment/Plan    PT Assessment Patent does not need any further PT services  PT Problem List         PT Treatment Interventions      PT Goals (Current goals can be found in the Care Plan section)  Acute Rehab PT Goals PT Goal Formulation: All assessment and education complete, DC therapy    Frequency     Barriers to discharge  Co-evaluation               AM-PAC PT "6 Clicks" Mobility  Outcome Measure Help needed turning from your back to your side while in a flat bed without using bedrails?: None Help needed moving from lying on your back to sitting on the side of a flat bed without using bedrails?: None Help needed moving to and from a bed to a chair (including a wheelchair)?: None Help needed standing up from a chair using your arms (e.g., wheelchair or bedside chair)?: None Help needed to walk in hospital room?: None Help needed climbing 3-5 steps with a railing? : None 6 Click Score: 24    End of Session Equipment Utilized During Treatment: Oxygen Activity Tolerance: Patient tolerated treatment well Patient left: in bed;with call bell/phone  within reach;with family/visitor present   PT Visit Diagnosis: Other abnormalities of gait and mobility (R26.89)    Time: 1100-1116 PT Time Calculation (min) (ACUTE ONLY): 16 min   Charges:   PT Evaluation $PT Eval Low Complexity: [place] Pager [phone] Office [place] 07/31/2019, 11:40 AM

## 2019-07-31 NOTE — Progress Notes (Signed)
CROSS COVER [facility]GI Subjective: Since I last evaluated the patient, she seems to be doing much better today.  She denies having any nausea vomiting or abdominal pain.  She is anxious to go home hemoglobin has been stable around 8.1 today.  She is tolerating her diet well  Objective: Vital signs in last 24 hours: Temp:  [97.7 F (36.5 C)-98.5 F (36.9 C)] 98.4 F (36.9 C) (11/29 0509) Pulse Rate:  [59-74] 59 (11/29 0509) Resp:  [16-22] 18 (11/29 0509) BP: (120-139)/(45-64) 135/48 (11/29 0509) SpO2:  [98 %-100 %] 100 % (11/29 0509) FiO2 (%):  [28 %] 28 % (11/28 0820) Weight:  [73.2 kg] 73.2 kg (11/29 0509) Last BM Date: 07/30/19  Intake/Output from previous day: 11/28 0701 - 11/29 0700 In: 1272.8 [P.O.:100; I.V.:437.8; Blood:735] Out: 1300 [Urine:1300] Intake/Output this shift: No intake/output data recorded.  General appearance: alert, cooperative, appears stated age, no distress and pale Resp: clear to auscultation bilaterally Cardio: regular rate and rhythm, S1, S2 normal, no murmur, click, rub or gallop GI: soft, non-tender; bowel sounds normal; no masses,  no organomegaly  Lab Results: Recent Labs    07/30/19 0419 07/30/19 1503 07/31/19 0501  WBC 7.2 6.2 7.5  HGB 6.8* 8.3* 8.1*  HCT 21.0* 25.5* 24.4*  PLT 140* 137* 144*   BMET Recent Labs    07/29/19 0515 07/30/19 0419 07/31/19 0501  NA 135 141 142  K 4.1 3.8 4.2  CL 99 105 105  CO2 26 26 28   GLUCOSE 123* 107* 112*  BUN 55* 16 10  CREATININE 0.80 0.49 0.62  CALCIUM 8.6* 8.5* 8.8*   LFT Recent Labs    07/28/19 2151  PROT 5.7*  ALBUMIN 3.5  AST 19  ALT 14  ALKPHOS 36*  BILITOT 0.5   PT/INR Recent Labs    07/28/19 2151  LABPROT 13.5  INR 1.0   Medications: I have reviewed the patient's current medications.  Assessment/Plan: 1) Duodenal ulcers with visible vessels presenting as melena and anemia; currently stable.  Patient is to avoid the use of all nonsteroidals and take PPIs on an  outpatient basis and follow-up with lab our gastroenterology within 2 weeks of discharge.  We will sign off now please call for further questions or concerns. 2) Chronic combined systolic and diastolic congestive heart failure. 3) COPD on home oxygen. 4) AODM. 5) Mild renal insufficiency.  LOS: 2 days   Juanita Craver 07/31/2019, 8:10 AM

## 2019-07-31 NOTE — Plan of Care (Signed)
  Problem: Clinical Measurements: Goal: Ability to maintain clinical measurements within normal limits will improve Outcome: Progressing Goal: Will remain free from infection Outcome: Progressing Goal: Diagnostic test results will improve Outcome: Progressing Goal: Respiratory complications will improve Outcome: Progressing Goal: Cardiovascular complication will be avoided Outcome: Progressing   Problem: Activity: Goal: Risk for activity intolerance will decrease Outcome: Progressing   Problem: Nutrition: Goal: Adequate nutrition will be maintained Outcome: Progressing   Problem: Elimination: Goal: Will not experience complications related to bowel motility Outcome: Progressing   Problem: Elimination: Goal: Will not experience complications related to bowel motility Outcome: Progressing Goal: Will not experience complications related to urinary retention Outcome: Progressing   Problem: Pain Managment: Goal: General experience of comfort will improve Outcome: Progressing   Problem: Safety: Goal: Ability to remain free from injury will improve Outcome: Progressing   Problem: Skin Integrity: Goal: Risk for impaired skin integrity will decrease Outcome: Progressing

## 2019-07-31 NOTE — Progress Notes (Signed)
Report given to [facility] LPN.

## 2019-07-31 NOTE — Progress Notes (Signed)
Patient's daughter requesting to speak to the doctor regarding patients' discharge and current diet order. Swayze, MD paged.

## 2019-07-31 NOTE — Progress Notes (Signed)
PROGRESS NOTE  [name] [id number] DOB: 1940-09-10 DOA: 07/28/2019 PCP: Esaw Grandchild, NP  Brief History   [name] medical history significant forCOPD, chronic hypoxic respiratory failure, complete heart block with pacer, chronic combined systolic and diastolic CHF, type 2 diabetes mellitus, and recent dark stool with anemia, now presenting to the ED for evaluation of increased fatigue, generalized weakness, and near syncope.Patient was accompanied by her daughter who assists with the history. Patient was experiencing increased fatigue and generalized weakness, went to her daughter's house for Thanksgiving dinner, continued to feel poorly, and developed nausea and presyncope. She had been experiencing dark stools for couple months and had a drop in her hemoglobin from 12.4 down to 10.4 in September 2020. She had fecal occult blood testing that was positive x3. She had been using NSAIDs daily for her chronic arthritic pains, but now limits her analgesics to Tylenol. In the ED, patient is found to be stable, labs showed BUN of 63 and creatinine 1.09, hemoglobin 7.7, down from 11.6 tendays earlier. Patient was given 250 cc normal saline and 40 mg IV Protonix in the ED, type and screen was performed, and COVID-19 test negative.  The patient was admitted to a telemetry bed. Gastroenterology was consulted. The patient underwent EGD on 07/29/2019. This revealed presence of Erosive gastropathy with no bleeding and no stigmata of recent bleeding. Biopsies taken. Gastritis, biopsied. Two non-bleeding duodenal ulcers each with a visible vessel. Treated with bipolar cautery. Recommendation was for 72 hours of IV pantoprazole prior to dc and then pantoprazole 4 mg PO bid for discharge. I have confirmed this with Dr. Collene Mares today.   Consultants  . Gastroenterology  Procedures  . EGD with cautery of duodenal ulcers.  Antibiotics   Anti-infectives (From admission,  onward)   None    .  Subjective  The patient is resting comfortably. No new complaints.  Objective   Vitals:  Vitals:   07/31/19 0918 07/31/19 1136  BP: (!) 122/50 (!) 128/43  Pulse: 82 60  Resp: 16 18  Temp: 98.3 F (36.8 C) 97.6 F (36.4 C)  SpO2: 91% 100%   Exam:  Constitutional:  . The patient is awake, alert, and oriented x 3. No acute distress. Respiratory:  . No increased work of breathing. . No wheezes, rales, or rhonchi . No tactile fremitus Cardiovascular:  . Regular rate and rhythm . No murmurs, ectopy, or gallups. . No lateral PMI. No thrills. Abdomen:  . Abdomen is soft, non-tender, non-distended . No hernias, masses, or organomegaly . Normoactive bowel sounds.  Musculoskeletal:  . No cyanosis, clubbing, or edema Skin:  . No rashes, lesions, ulcers . palpation of skin: no induration or nodules Neurologic:  . CN 2-12 intact . Sensation all 4 extremities intact Psychiatric:  . Mental status o Mood, affect appropriate o Orientation to person, place, time  . judgment and insight appear intact  I have personally reviewed the following:   Today's Data  . Vitals, BMP, CBC  Scheduled Meds: . arformoterol  15 mcg Nebulization BID  . DULoxetine  20 mg Oral Daily  . feeding supplement  1 Container Oral TID BM  . furosemide  40 mg Oral Daily  . insulin aspart  0-7 Units Subcutaneous Q4H  . mouth rinse  15 mL Mouth Rinse BID  . metoprolol succinate  12.5 mg Oral Daily  . pantoprazole (PROTONIX) IV  40 mg Intravenous Q12H  . pravastatin  40 mg Oral Daily  .  sodium chloride flush  3 mL Intravenous Q12H  . sodium chloride flush  3 mL Intravenous Q12H  . umeclidinium bromide  1 puff Inhalation Daily   Continuous Infusions: . sodium chloride Stopped (07/30/19 1134)    Principal Problem:   Symptomatic anemia Active Problems:   Chronic combined systolic and diastolic CHF (congestive heart failure) (HCC)   COPD GOLD II   Diabetes mellitus type 2,  controlled, without complications (HCC)   Mild renal insufficiency   Pressure injury of skin   Melena   LOS: 2 days   A & P  Upper GI bleeding: GI consulted, EGD done showed gastritis, biopsied.  Also showed 2 nonbleeding duodenal ulcers each with a visible vessel treated with bipolar cautery. Continue protonix IV x 72 hours.  Recommend advancing diet to soft, continuing PPI Discussed extensively with patient on the need to not take NSAIDs (listed them all up for her), and to continue with Tylenol for her pain, patient verbalized understanding  Acute on chronic blood loss/symptomatic anemia: Monitor hemoglobin. Transfuse for hemoglobin less than 7.0. She has received one unit of PRBC's in transfusion. New baseline around 11. 8.1 this morning.   COPD/chronic hypoxic respiratory failure: Stable, on home O2, 2 L at baseline Continue inhalers. The patient has requesting portable oxygen concentrator, case manager notified.  Chronic combined systolic & diastolic CHF: The patient appears euvolemic. Echocardiogram in 05/2019 demonstrated EF of [id number] with diastolic dysfunction. Restart home Lasix, continue to hold ARB.  Type II DM: A1c was 6.2% in November 2020. Metformin is on hold while the patient is inpatient. SSI, hypoglycemic protocol, Accu-Cheks.  Complete heart block status post pacer: Noted. Stable.  I have seen and examined this patient myself. I have spent 42 minutes in her evaluation and care. I have spent >50 % of this in counseling with the patient's daughter. All questions answered to the best of my ability.  Rhyse Loux, DO [facility] Direct contact: see [email or web address]  7PM-7AM contact night coverage as above 07/31/2019, 3:30 PM  LOS: 2 days

## 2019-08-01 ENCOUNTER — Telehealth: Payer: Self-pay

## 2019-08-01 ENCOUNTER — Other Ambulatory Visit: Payer: Self-pay | Admitting: Physician Assistant

## 2019-08-01 DIAGNOSIS — D649 Anemia, unspecified: Secondary | ICD-10-CM

## 2019-08-01 LAB — CBC WITH DIFFERENTIAL/PLATELET
Abs Immature Granulocytes: 0.04 10*3/uL (ref 0.00–0.07)
Basophils Absolute: 0 10*3/uL (ref 0.0–0.1)
Basophils Relative: 0 %
Eosinophils Absolute: 0.1 10*3/uL (ref 0.0–0.5)
Eosinophils Relative: 2 %
HCT: 26.3 % — ABNORMAL LOW (ref 36.0–46.0)
Hemoglobin: 8.6 g/dL — ABNORMAL LOW (ref 12.0–15.0)
Immature Granulocytes: 1 %
Lymphocytes Relative: 31 %
Lymphs Abs: 2.5 10*3/uL (ref 0.7–4.0)
MCH: 30 pg (ref 26.0–34.0)
MCHC: 32.7 g/dL (ref 30.0–36.0)
MCV: 91.6 fL (ref 80.0–100.0)
Monocytes Absolute: 0.7 10*3/uL (ref 0.1–1.0)
Monocytes Relative: 9 %
Neutro Abs: 4.7 10*3/uL (ref 1.7–7.7)
Neutrophils Relative %: 57 %
Platelets: 170 10*3/uL (ref 150–400)
RBC: 2.87 MIL/uL — ABNORMAL LOW (ref 3.87–5.11)
RDW: 14.2 % (ref 11.5–15.5)
WBC: 8 10*3/uL (ref 4.0–10.5)
nRBC: 0 % (ref 0.0–0.2)

## 2019-08-01 LAB — BASIC METABOLIC PANEL
Anion gap: 9 (ref 5–15)
BUN: 17 mg/dL (ref 8–23)
CO2: 28 mmol/L (ref 22–32)
Calcium: 8.5 mg/dL — ABNORMAL LOW (ref 8.9–10.3)
Chloride: 101 mmol/L (ref 98–111)
Creatinine, Ser: 0.75 mg/dL (ref 0.44–1.00)
GFR calc Af Amer: >60 mL/min (ref >60)
GFR calc non Af Amer: >60 mL/min (ref >60)
Glucose, Bld: 117 mg/dL — ABNORMAL HIGH (ref 70–99)
Potassium: 3.6 mmol/L (ref 3.5–5.1)
Sodium: 138 mmol/L (ref 135–145)

## 2019-08-01 LAB — GLUCOSE, CAPILLARY
Glucose-Capillary: 109 mg/dL — ABNORMAL HIGH (ref 70–99)
Glucose-Capillary: 121 mg/dL — ABNORMAL HIGH (ref 70–99)
Glucose-Capillary: 133 mg/dL — ABNORMAL HIGH (ref 70–99)
Glucose-Capillary: 143 mg/dL — ABNORMAL HIGH (ref 70–99)
Glucose-Capillary: 151 mg/dL — ABNORMAL HIGH (ref 70–99)

## 2019-08-01 LAB — SURGICAL PATHOLOGY

## 2019-08-01 MED ORDER — PANTOPRAZOLE SODIUM 40 MG PO TBEC: 40.0000 mg | DELAYED_RELEASE_TABLET | Freq: Two times a day (BID) | ORAL | 3 refills | Status: DC

## 2019-08-01 MED ORDER — MUSCLE RUB 10-15 % EX CREA: 1.0000 "application " | TOPICAL_CREAM | CUTANEOUS | 0 refills | Status: DC | PRN

## 2019-08-01 NOTE — Discharge Summary (Signed)
Physician Discharge Summary  VELVET DOBRINSKI [id number] DOB: 1940/10/13 DOA: 07/28/2019  PCP: Esaw Grandchild, NP  Admit date: 07/28/2019 Discharge date: 08/01/2019  Admitted From: Home Disposition: Home  Recommendations for Outpatient Follow-up:  1. Follow up with PCP in 1-2 weeks 2. Please obtain BMP/CBC in one week 3. Needs to follow-up with GI, repeat hemoglobin and follow-up biopsy result.  Home Health: None  Discharge Condition: Stable CODE STATUS: Full code Diet recommendation: Heart Healthy   Brief/Interim Summary: Tammy C Dunlapis a 78 y.o.femalewith medical history significant forCOPD, chronic hypoxic respiratory failure, complete heart block with pacer, chronic combined systolic and diastolic CHF, type 2 diabetes mellitus, and recent dark stool with anemia, now presenting to the ED for evaluation of increased fatigue, generalized weakness, and near syncope.Patient was accompanied by her daughter who assists with the history. Patient was experiencing increased fatigue and generalized weakness, went to her daughter's house for Thanksgiving dinner, continued to feel poorly, and developed nausea and presyncope. She had been experiencing dark stools for couple months and had a drop in her hemoglobin from 12.4 down to 10.4 in September 2020. She had fecal occult blood testing that was positive x3. She had been using NSAIDs daily for her chronic arthritic pains, but now limits her analgesics to Tylenol. In the ED, patient is found to be stable, labs showed BUN of 63 and creatinine 1.09, hemoglobin 7.7, down from 11.6 tendays earlier. Patient was given 250 cc normal saline and 40 mg IV Protonix in the ED, type and screen was performed, and COVID-19 test negative.  The patient was admitted to a telemetry bed. Gastroenterology was consulted. The patient underwent EGD on 07/29/2019. This revealed presence of Erosive gastropathy with no bleeding and no stigmata of recent bleeding.  Biopsies taken. Gastritis, biopsied. Two non-bleeding duodenal ulcers each with a visible vessel. Treated with bipolar cautery. Recommendation was for 72 hours of IV pantoprazole prior to dc and then pantoprazole 4 mg PO bid for discharge.  Last dose of IV PPI today.  Plan to discharge home this afternoon, on oral PPI  1-Upper GI bleeding: GI consulted, EGD done showed gastritis, biopsied. Also showed 2 nonbleeding duodenal ulcers each with a visible vessel treated with bipolar cautery. Treated  protonix IV x 72 hours.Recommend advancing diet to soft, continuing PPI at discharge.  Daughter aware that patient shouldn't take ( aspirin, Ibuprofen, Meloxica) HB remain stable.   Acute on chronic blood loss/symptomatic anemia: Monitor hemoglobin. Transfuse for hemoglobin less than 7.0. She has received one unit of PRBC's in transfusion. New baseline around 11.  Hb remain stable at 8  COPD/chronic hypoxic respiratory failure: Stable, on home O2, 2 L at baseline Continue inhalers. The patient has requesting portable oxygen concentrator, case manager notified.  Chronic combined systolic & diastolic CHF: The patient appears euvolemic. Echocardiogram in 05/2019 demonstrated EF of [id number] with diastolic dysfunction. Restart home Lasix, continue to hold ARB.  Type II DM: A1c was 6.2% in November 2020. Metformin is on hold while the patient is inpatient. SSI, hypoglycemic protocol, Accu-Cheks.  Complete heart block status post pacer: Noted. Stable.  Discharge Diagnoses:   Principal Problem:   Symptomatic anemia Active Problems:   Chronic combined systolic and diastolic CHF (congestive heart failure) (HCC)   COPD GOLD II   Diabetes mellitus type 2, controlled, without complications (HCC)   Mild renal insufficiency   Pressure injury of skin   Melena    Discharge Instructions  Discharge Instructions    Diet - low  sodium heart healthy   Complete by: As directed    Increase activity slowly    Complete by: As directed      Allergies as of 08/01/2019   No Known Allergies     Medication List    STOP taking these medications   aspirin EC 81 MG tablet   losartan 50 MG tablet Commonly known as: COZAAR   meloxicam 15 MG tablet Commonly known as: MOBIC   potassium chloride SA 20 MEQ tablet Commonly known as: Klor-Con M20     TAKE these medications   acetaminophen 500 MG tablet Commonly known as: TYLENOL Take 500 mg by mouth every 6 (six) hours as needed for mild pain.   DULoxetine 20 MG capsule Commonly known as: CYMBALTA TAKE 1 CAPSULE (20 MG TOTAL) BY MOUTH DAILY. OFFICE VISIT REQUIRED PRIOR TO ANY FURTHER REFILLS What changed: additional instructions   Flutter Devi Use flutter device three times a day as instructed   furosemide 40 MG tablet Commonly known as: LASIX Take 1 tablet (40 mg total) by mouth daily. May take an extra tablet for weight gain of 2 lb in a day and 5 lbs in a weeks.   JUICE PLUS FIBRE PO Take 3 tablets by mouth at bedtime.   metFORMIN 500 MG tablet Commonly known as: GLUCOPHAGE 1/2 tablet nightly with dinner What changed:   how much to take  how to take this  when to take this  additional instructions   metoprolol succinate 25 MG 24 hr tablet Commonly known as: TOPROL-XL Take 0.5 tablets (12.5 mg total) by mouth daily. Take with or immediately following a meal.   Muscle Rub 10-15 % Crea Apply 1 application topically as needed for muscle pain.   OXYGEN 2lpm with sleep and exertion [employer]   pantoprazole 40 MG tablet Commonly known as: Protonix Take 1 tablet (40 mg total) by mouth 2 (two) times daily.   pravastatin 40 MG tablet Commonly known as: PRAVACHOL TAKE 1 TABLET BY MOUTH EVERY DAY   Stiolto Respimat 2.5-2.5 MCG/ACT Aers Generic drug: Tiotropium Bromide-Olodaterol Inhale 2 puffs into the lungs daily. What changed: when to take this       No Known  Allergies  Consultations:  GI   Procedures/Studies: Dg Chest 2 View  Result Date: 07/28/2019 CLINICAL DATA:  Shortness of breath EXAM: CHEST - 2 VIEW COMPARISON:  Cardiac CT 06/03/2019, radiograph 05/13/2019 FINDINGS: Chronically coarsened interstitial opacities are similar to prior. No consolidation, features of edema, pneumothorax, or effusion. The aorta is calcified. The remaining cardiomediastinal contours are unremarkable. Pacer pack overlies the left chest wall with leads at the cardiac apex and right atrium. Cervical fusion hardware is noted. Degenerative changes are present in the imaged spine and shoulders. Dextrocurvature of the spine is similar to prior. IMPRESSION: 1. Stable chronic interstitial changes. No superimposed acute cardiopulmonary process. 2.  Aortic Atherosclerosis (ICD10-I70.0). Electronically Signed   By: Lovena Le M.D.   On: 07/28/2019 22:07     Subjective: Denies abdominal pain. Has small BM last night, old black stool.    Discharge Exam: Vitals:   07/31/19 2037 08/01/19 0403  BP:  (!) 155/56  Pulse: 72 83  Resp: 16 20  Temp:    SpO2: 100% 100%     General: Pt is alert, awake, not in acute distress Cardiovascular: RRR, S1/S2 +, no rubs, no gallops Respiratory: CTA bilaterally, no wheezing, no rhonchi Abdominal: Soft, NT, ND, bowel sounds + Extremities: no edema, no cyanosis  The results of significant diagnostics from this hospitalization (including imaging, microbiology, ancillary and laboratory) are listed below for reference.     Microbiology: Recent Results (from the past 240 hour(s))  SARS CORONAVIRUS 2 (TAT 6-24 HRS) Nasopharyngeal Nasopharyngeal Swab     Status: None   Collection Time: 07/29/19 12:09 AM   Specimen: Nasopharyngeal Swab  Result Value Ref Range Status   SARS Coronavirus 2 NEGATIVE NEGATIVE Final    Comment: (NOTE) SARS-CoV-2 target nucleic acids are NOT DETECTED. The SARS-CoV-2 RNA is generally detectable in upper  and lower respiratory specimens during the acute phase of infection. Negative results do not preclude SARS-CoV-2 infection, do not rule out co-infections with other pathogens, and should not be used as the sole basis for treatment or other patient management decisions. Negative results must be combined with clinical observations, patient history, and epidemiological information. The expected result is Negative. Fact Sheet for Patients: SugarRoll.be Fact Sheet for Healthcare Providers: https://www.woods-mathews.com/ This test is not yet approved or cleared by the [place] FDA and  has been authorized for detection and/or diagnosis of SARS-CoV-2 by FDA under an Emergency Use Authorization (EUA). This EUA will remain  in effect (meaning this test can be used) for the duration of the COVID-19 declaration under Section 56 4(b)(1) of the Act, 21 U.S.C. section 360bbb-3(b)(1), unless the authorization is terminated or revoked sooner. Performed at [facility], [place] [address], [place], [place] [address]      Labs: BNP (last 3 results) Recent Labs    07/28/19 2151  BNP 123456*   Basic Metabolic Panel: Recent Labs  Lab 07/28/19 2151 07/29/19 0515 07/30/19 0419 07/31/19 0501 08/01/19 0531  NA 135 135 141 142 138  K 4.2 4.1 3.8 4.2 3.6  CL 97* 99 105 105 101  CO2 24 26 26 28 28   GLUCOSE 179* 123* 107* 112* 117*  BUN 63* 55* 16 10 17   CREATININE 1.09* 0.80 0.49 0.62 0.75  CALCIUM 8.9 8.6* 8.5* 8.8* 8.5*   Liver Function Tests: Recent Labs  Lab 07/28/19 2151  AST 19  ALT 14  ALKPHOS 36*  BILITOT 0.5  PROT 5.7*  ALBUMIN 3.5   No results for input(s): LIPASE, AMYLASE in the last 168 hours. No results for input(s): AMMONIA in the last 168 hours. CBC: Recent Labs  Lab 07/28/19 2151  07/29/19 2036 07/30/19 0419 07/30/19 1503 07/31/19 0501 08/01/19 0531  WBC 11.6*  --  8.2 7.2 6.2 7.5 8.0  NEUTROABS 9.1*  --   --    --   --  4.0 4.7  HGB 7.7*   < > 7.8* 6.8* 8.3* 8.1* 8.6*  HCT 24.3*   < > 23.0* 21.0* 25.5* 24.4* 26.3*  MCV 94.9  --  89.8 92.1 90.4 90.7 91.6  PLT 201  --  147* 140* 137* 144* 170   < > = values in this interval not displayed.   Cardiac Enzymes: No results for input(s): CKTOTAL, CKMB, CKMBINDEX, TROPONINI in the last 168 hours. BNP: Invalid input(s): POCBNP CBG: Recent Labs  Lab 07/31/19 1627 07/31/19 2018 08/01/19 0021 08/01/19 0357 08/01/19 0750  GLUCAP 177* 144* 121* 109* 143*   D-Dimer No results for input(s): DDIMER in the last 72 hours. Hgb A1c No results for input(s): HGBA1C in the last 72 hours. Lipid Profile No results for input(s): CHOL, HDL, LDLCALC, TRIG, CHOLHDL, LDLDIRECT in the last 72 hours. Thyroid function studies No results for input(s): TSH, T4TOTAL, T3FREE, THYROIDAB in the last 72 hours.  Invalid input(s): FREET3 Anemia work up No results for input(s): VITAMINB12, FOLATE, FERRITIN, TIBC, IRON, RETICCTPCT in the last 72 hours. Urinalysis    Component Value Date/Time   COLORURINE YELLOW 07/01/2018 1030   APPEARANCEUR CLOUDY (A) 07/01/2018 1030   LABSPEC >1.046 (H) 07/01/2018 1030   PHURINE 9.0 (H) 07/01/2018 1030   GLUCOSEU NEGATIVE 07/01/2018 1030   HGBUR NEGATIVE 07/01/2018 1030   BILIRUBINUR NEGATIVE 07/01/2018 1030   KETONESUR 20 (A) 07/01/2018 1030   PROTEINUR NEGATIVE 07/01/2018 1030   UROBILINOGEN 0.2 12/06/2014 1642   NITRITE NEGATIVE 07/01/2018 1030   LEUKOCYTESUR NEGATIVE 07/01/2018 1030   Sepsis Labs Invalid input(s): PROCALCITONIN,  WBC,  LACTICIDVEN Microbiology Recent Results (from the past 240 hour(s))  SARS CORONAVIRUS 2 (TAT 6-24 HRS) Nasopharyngeal Nasopharyngeal Swab     Status: None   Collection Time: 07/29/19 12:09 AM   Specimen: Nasopharyngeal Swab  Result Value Ref Range Status   SARS Coronavirus 2 NEGATIVE NEGATIVE Final    Comment: (NOTE) SARS-CoV-2 target nucleic acids are NOT DETECTED. The SARS-CoV-2 RNA is  generally detectable in upper and lower respiratory specimens during the acute phase of infection. Negative results do not preclude SARS-CoV-2 infection, do not rule out co-infections with other pathogens, and should not be used as the sole basis for treatment or other patient management decisions. Negative results must be combined with clinical observations, patient history, and epidemiological information. The expected result is Negative. Fact Sheet for Patients: SugarRoll.be Fact Sheet for Healthcare Providers: https://www.woods-mathews.com/ This test is not yet approved or cleared by the [place] FDA and  has been authorized for detection and/or diagnosis of SARS-CoV-2 by FDA under an Emergency Use Authorization (EUA). This EUA will remain  in effect (meaning this test can be used) for the duration of the COVID-19 declaration under Section 56 4(b)(1) of the Act, 21 U.S.C. section 360bbb-3(b)(1), unless the authorization is terminated or revoked sooner. Performed at [facility], [place] [address] [place], [place] [address]      Time coordinating discharge: 40 minutes  SIGNED:   Elmarie Shiley, MD  [facility]

## 2019-08-01 NOTE — Telephone Encounter (Signed)
-----   Message from Yetta Flock, MD sent at 08/01/2019 10:39 AM EST ----- Regarding: follow up Chloris Marcoux this is Dr. Ardis Hughs outpatient, being discharged today. She will need repeat Hgb in one week and follow up with Dr. Ardis Hughs or PA in the next 1 month or so. Thanks

## 2019-08-01 NOTE — Plan of Care (Signed)
  Problem: Clinical Measurements: Goal: Respiratory complications will improve Outcome: Progressing   Problem: Activity: Goal: Risk for activity intolerance will decrease Outcome: Progressing   Problem: Coping: Goal: Level of anxiety will decrease Outcome: Progressing   Problem: Pain Managment: Goal: General experience of comfort will improve Outcome: Progressing   

## 2019-08-01 NOTE — TOC Transition Note (Addendum)
Transition of Care [facility]) - CM/SW Discharge Note   Patient Details  Name: [name] MRN: [id number] Date of Birth: [birth year]  [facility] CM/SW Contact:  Zenon Mayo, RN Phone Number: 08/01/2019, 12:49 PM   Clinical Narrative:    Patient is for dc today, she has home oxygen with [employer] 2 liters, she states she will ask her daughter to bring one of her tanks here for her to go home with today.  NCM informed Zack with [employer] that MD put order in for patient to be evaluated for portable oxygen.     Final next level of care: Home/Self Care Barriers to Discharge: No Barriers Identified   Patient Goals and CMS Choice Patient states their goals for this hospitalization and ongoing recovery are:: get better   Choice offered to / list presented to : NA  Discharge Placement                       Discharge Plan and Services                DME Arranged: (NA)         HH Arranged: NA          Social Determinants of Health (SDOH) Interventions     Readmission Risk Interventions No flowsheet data found.

## 2019-08-01 NOTE — Telephone Encounter (Signed)
appt made to see Dr Ardis Hughs on 09/09/19 at 130 pm and lab order in Epic for lab order in 1 month.  Pt to be advised at discharge

## 2019-08-02 ENCOUNTER — Other Ambulatory Visit: Payer: Self-pay

## 2019-08-02 MED ORDER — METRONIDAZOLE 250 MG PO TABS: 250.0000 mg | ORAL_TABLET | Freq: Four times a day (QID) | ORAL | 0 refills | Status: AC

## 2019-08-02 MED ORDER — DOXYCYCLINE HYCLATE 100 MG PO CAPS: 100.0000 mg | ORAL_CAPSULE | Freq: Two times a day (BID) | ORAL | 0 refills | Status: AC

## 2019-08-02 MED ORDER — BISMUTH SUBSALICYLATE 262 MG PO CHEW: 524.0000 mg | CHEWABLE_TABLET | Freq: Four times a day (QID) | ORAL | 0 refills | Status: AC

## 2019-08-08 ENCOUNTER — Other Ambulatory Visit: Payer: Self-pay

## 2019-08-08 ENCOUNTER — Other Ambulatory Visit: Payer: Medicare Other

## 2019-08-08 ENCOUNTER — Ambulatory Visit (INDEPENDENT_AMBULATORY_CARE_PROVIDER_SITE_OTHER): Payer: Medicare Other | Admitting: Adult Health

## 2019-08-08 DIAGNOSIS — I10 Essential (primary) hypertension: Secondary | ICD-10-CM | POA: Diagnosis not present

## 2019-08-08 DIAGNOSIS — D649 Anemia, unspecified: Secondary | ICD-10-CM

## 2019-08-08 NOTE — Progress Notes (Deleted)
Virtual Visit via Telephone Note  I connected with [name] on 08/08/19 at  3:15 PM EST by telephone and verified that I am speaking with the correct person using two identifiers.  Location: Patient: Home Provider: In Clinic   I discussed the limitations, risks, security and privacy concerns of performing an evaluation and management service by telephone and the availability of in person appointments. I also discussed with the patient that there may be a patient responsible charge related to this service. The patient expressed understanding and agreed to proceed.   History of Present Illness:    Observations/Objective:   Assessment and Plan:   Follow Up Instructions:    I discussed the assessment and treatment plan with the patient. The patient was provided an opportunity to ask questions and all were answered. The patient agreed with the plan and demonstrated an understanding of the instructions.   The patient was advised to call back or seek an in-person evaluation if the symptoms worsen or if the condition fails to improve as anticipated.  I provided *** minutes of non-face-to-face time during this encounter.   Esaw Grandchild, NP

## 2019-08-09 LAB — BASIC METABOLIC PANEL
BUN/Creatinine Ratio: 31 — ABNORMAL HIGH (ref 12–28)
BUN: 24 mg/dL (ref 8–27)
CO2: 27 mmol/L (ref 20–29)
Calcium: 9.3 mg/dL (ref 8.7–10.3)
Chloride: 96 mmol/L (ref 96–106)
Creatinine, Ser: 0.77 mg/dL (ref 0.57–1.00)
GFR calc Af Amer: 85 mL/min/{1.73_m2} (ref >59)
GFR calc non Af Amer: 74 mL/min/{1.73_m2} (ref >59)
Glucose: 125 mg/dL — ABNORMAL HIGH (ref 65–99)
Potassium: 4 mmol/L (ref 3.5–5.2)
Sodium: 136 mmol/L (ref 134–144)

## 2019-08-09 LAB — CBC WITH DIFFERENTIAL/PLATELET
Basophils Absolute: 0 10*3/uL (ref 0.0–0.2)
Basos: 0 %
EOS (ABSOLUTE): 0.1 10*3/uL (ref 0.0–0.4)
Eos: 1 %
Hematocrit: 31.7 % — ABNORMAL LOW (ref 34.0–46.6)
Hemoglobin: 10.2 g/dL — ABNORMAL LOW (ref 11.1–15.9)
Immature Grans (Abs): 0 10*3/uL (ref 0.0–0.1)
Immature Granulocytes: 0 %
Lymphocytes Absolute: 1.8 10*3/uL (ref 0.7–3.1)
Lymphs: 22 %
MCH: 29.4 pg (ref 26.6–33.0)
MCHC: 32.2 g/dL (ref 31.5–35.7)
MCV: 91 fL (ref 79–97)
Monocytes Absolute: 0.6 10*3/uL (ref 0.1–0.9)
Monocytes: 7 %
Neutrophils Absolute: 5.7 10*3/uL (ref 1.4–7.0)
Neutrophils: 70 %
Platelets: 238 10*3/uL (ref 150–450)
RBC: 3.47 x10E6/uL — ABNORMAL LOW (ref 3.77–5.28)
RDW: 14.2 % (ref 11.7–15.4)
WBC: 8.2 10*3/uL (ref 3.4–10.8)

## 2019-08-16 ENCOUNTER — Telehealth: Payer: Self-pay | Admitting: Cardiovascular Disease

## 2019-08-16 ENCOUNTER — Other Ambulatory Visit: Payer: Self-pay | Admitting: Medical

## 2019-08-16 NOTE — Telephone Encounter (Signed)
Pt's daughter notified may come to appt /.cy

## 2019-08-16 NOTE — Telephone Encounter (Signed)
New Message  Pt's daughter called and said that she will be accompanying her on her visit. Says she needs assistance walking and standing. Also needs help with oxygen tank.  Please call to discuss

## 2019-08-19 ENCOUNTER — Encounter: Payer: Self-pay | Admitting: Cardiovascular Disease

## 2019-08-19 ENCOUNTER — Other Ambulatory Visit: Payer: Self-pay

## 2019-08-19 ENCOUNTER — Ambulatory Visit (INDEPENDENT_AMBULATORY_CARE_PROVIDER_SITE_OTHER): Payer: Medicare Other | Admitting: Cardiovascular Disease

## 2019-08-19 VITALS — BP 124/64 | HR 74 | Temp 97.3°F | Ht 67.0 in | Wt 164.2 lb

## 2019-08-19 DIAGNOSIS — I2721 Secondary pulmonary arterial hypertension: Secondary | ICD-10-CM

## 2019-08-19 DIAGNOSIS — I442 Atrioventricular block, complete: Secondary | ICD-10-CM | POA: Diagnosis not present

## 2019-08-19 DIAGNOSIS — D649 Anemia, unspecified: Secondary | ICD-10-CM

## 2019-08-19 DIAGNOSIS — J449 Chronic obstructive pulmonary disease, unspecified: Secondary | ICD-10-CM

## 2019-08-19 DIAGNOSIS — I2510 Atherosclerotic heart disease of native coronary artery without angina pectoris: Secondary | ICD-10-CM | POA: Diagnosis not present

## 2019-08-19 DIAGNOSIS — I5042 Chronic combined systolic (congestive) and diastolic (congestive) heart failure: Secondary | ICD-10-CM

## 2019-08-19 DIAGNOSIS — Z950 Presence of cardiac pacemaker: Secondary | ICD-10-CM | POA: Diagnosis not present

## 2019-08-19 DIAGNOSIS — K274 Chronic or unspecified peptic ulcer, site unspecified, with hemorrhage: Secondary | ICD-10-CM

## 2019-08-19 MED ORDER — LOSARTAN POTASSIUM 25 MG PO TABS: 25.0000 mg | ORAL_TABLET | Freq: Every day | ORAL | 3 refills | Status: DC

## 2019-08-19 NOTE — Patient Instructions (Signed)
Medication Instructions:  START LOSARTAN 25 MG 1 TAB EVERY DAY  *If you need a refill on your cardiac medications before your next appointment, please call your pharmacy*  Lab Work:  Testing/Procedures: NONE  Follow-Up: At [facility], you and your health needs are our priority.  As part of our continuing mission to provide you with exceptional heart care, we have created designated Provider Care Teams.  These Care Teams include your primary Cardiologist (physician) and Advanced Practice Providers (APPs -  Physician Assistants and Nurse Practitioners) who all work together to provide you with the care you need, when you need it.  Your next appointment:   6 month(s)  The format for your next appointment:   Either In Person or Virtual  Provider:   Sanda Klein, MD  Other Instructions

## 2019-08-19 NOTE — Progress Notes (Signed)
[name], female   DOB: [birth year], 78 y.o.   MRN: [id number]    Cardiology Office Note    Date:  08/19/2019   ID:  [name], DOB [birth year], MRN [id number]  PCP:  Esaw Grandchild, NP  Cardiologist:   Sanda Klein, MD   Chief Complaint  Patient presents with  . Pacemaker Check  . Congestive Heart Failure  . Anemia    History of Present Illness:  [name] is a 78 y.o. female with complete heart block/pacemaker dependent, St. Jude pacemaker (implanted 12-22-2007, generator change Dec 21, 2017 St. Jude [employer]urity), chronic diastolic heart failure, hypertension, COPD with chronic respiratory insufficiency on home oxygen (quit smoking 27 years ago). She returns for a pacemaker follow-up.  ECHO in Sept 2020 showed dilated LV, reduction in LVEF to 45-50% and moderate MR. Follow-up coronary CTA 06/04/2019 showed nonobstructive CAD.   She was subsequently diagnosed with worsening anemia and Hemoccult-positive stools.  Upper endoscopy showed 2 ulcers in the duodenal bulb which had visible vessels in the bottom of the crater, although they are not actively bleeding.  These were cauterized.  Pathology subsequently confirmed H. pylori.  Aspirin was stopped.  Since then her hemoglobin has been steadily increasing and per the patient's report it was approximately 10 on the most recent labs to which I do not have access.  She has lost substantial weight.  Her family have been helping improve her diet and have removed salt rich foods.  She is steadily improving.  In fact her breathing is so much better that she is no longer using either inhalers or oxygen.  As always, her activity is primarily limited by severe back pain due to scoliosis.  She uses a walker or wheelchair.  The patient specifically denies any chest pain at rest or with exertion, dyspnea at rest, orthopnea, paroxysmal nocturnal dyspnea, syncope, palpitations, focal neurological deficits, intermittent claudication, lower  extremity edema, unexplained weight gain, cough, hemoptysis or wheezing. All other systems are reviewed and are negative.  Device function is normal.  Battery is at beginning of life with an estimated longevity of around 9.5-10 years.  There is only 9.6% atrial pacing with appropriate heart rate histogram distribution.  There is 99% ventricular pacing.  There is a noticeable reduction in her average daily heart rate over the last month, while her anemia has been improving.  As before she has occasional episodes of paroxysmal atrial tachycardia most of which are 10 seconds or shorter. There is no true atrial fibrillation.    She had minor coronary artery disease by previous angiography and has never had angina pectoris. She had acute HF exacerbation due to severe hypertension in the weeks after her husband passed away in 2014/12/22. At that time she underwent coronary angiography that showed very minor disease (30% stenosis at the takeoff of the first diagonal artery), normal pulmonary wedge pressure and mild pulmonary hypertension (38/16).  Coronary CT angiography in October 2020 showed moderately elevated calcium score at the 62nd percentile for age/gender, and moderate (50-69%) lesions in the LAD artery and first diagonal artery, with no evidence of significant obstruction by FFR.  Past Medical History:  Diagnosis Date  . Arthritis    "hands, spine" (12/07/2014)  . CHB (complete heart block) ([place]) 09/19/2013  . CHF (congestive heart failure) ([place]) dx'd 12/06/2014  . Chronic pain    "since neck OR in 2005/12/21"  . Coronary artery disease    normal coronaries by 09/10/00 cath  . Diabetes  mellitus    hga1c 7.1 no meds  . Dysrhythmia   . HTN (hypertension) 09/19/2013  . Hypercholesterolemia   . Hyperlipidemia 09/19/2013  . Hypertension   . Myocardial infarction ([place]) dx'd 2009  . Neuropathy of both feet   . Pacemaker   . Paralysis of right hand ([place]) 2007   "after spinal cord injury/OR"  . Shortness of breath     exertional  . Type II diabetes mellitus ([place])    "had it years ago; lost weight after neck OR in 2007; glucose went way down; recently dx'd as returned recently" (12/07/2014)  . Urinary incontinence    "since 2007's OR"  . Venous insufficiency (chronic) (peripheral)     Past Surgical History:  Procedure Laterality Date  . ANTERIOR CERVICAL DECOMP/DISCECTOMY FUSION  2007  . BACK SURGERY    . BIOPSY  07/29/2019   Procedure: BIOPSY;  Surgeon: Ladene Artist, MD;  Location: [facility] ENDOSCOPY;  Service: Endoscopy;;  . CARDIAC CATHETERIZATION  09/10/2000   normal Coronary arteries, EF60%,   . CARDIAC PACEMAKER PLACEMENT  12/01/2007   St.Jude Zephyr Elta [place] [id number], dual chamber  . CARDIOVASCULAR STRESS TEST  08/28/2000   Cardiolite perfusion study, EF58%, low risk study,   . DILATION AND CURETTAGE OF UTERUS  1960's  . ESOPHAGOGASTRODUODENOSCOPY N/A 07/29/2019   Procedure: ESOPHAGOGASTRODUODENOSCOPY (EGD);  Surgeon: Ladene Artist, MD;  Location: [facility] ENDOSCOPY;  Service: Endoscopy;  Laterality: N/A;  . HOT HEMOSTASIS N/A 07/29/2019   Procedure: HOT HEMOSTASIS (ARGON PLASMA COAGULATION/BICAP);  Surgeon: Ladene Artist, MD;  [facility] END[facility];  Service: Endoscopy;  Laterality: N/A;  . KNEE ARTHROSCOPY Right   . LAPAROSCOPIC CHOLECYSTECTOMY  1995  . LEFT AND RIGHT HEART CATHETERIZATION WITH CORONARY ANGIOGRAM N/A 12/14/2014   Procedure: LEFT AND RIGHT HEART CATHETERIZATION WITH CORONARY ANGIOGRAM;  Surgeon: Peter M Martinique, MD;  Location: [facility] CATH LAB;  Service: Cardiovascular;  Laterality: N/A;  . Lower Ext. Dopplers  06/12/2011   no evidence of thrombus, all vessels normal in size, no evidence of insuffiency  . LUMBAR LAMINECTOMY/DECOMPRESSION MICRODISCECTOMY  03/02/2012   Procedure: LUMBAR LAMINECTOMY/DECOMPRESSION MICRODISCECTOMY 2 LEVELS;  Surgeon: Floyce Stakes, MD;  Location: [facility] NEURO ORS;  Service: Neurosurgery;  Laterality: Bilateral;  Lumbar three, lumbar four, lumbar five  Laminectomy/Foraminotomy  . MOLE REMOVAL     left side of face near her eye  . NM MYOCAR PERF WALL MOTION  10/24/2008   protocol:Lexiscan, EF63%, normal study, scan negative for ischemia  . PPM GENERATOR CHANGEOUT N/A 05/26/2018   Procedure: PPM GENERATOR CHANGEOUT;  Surgeon: Sanda Klein, MD;  Location: [facility] CV LAB;  Service: Cardiovascular;  Laterality: N/A;  . TRANSTHORACIC ECHOCARDIOGRAM  11/30/2007   normal echo, mean transaortic valve gradient 61mmHg    Current Medications: Outpatient Medications Prior to Visit  Medication Sig Dispense Refill  . acetaminophen (TYLENOL) 500 MG tablet Take 500 mg by mouth every 6 (six) hours as needed for mild pain.     . DULoxetine (CYMBALTA) 20 MG capsule TAKE 1 CAPSULE (20 MG TOTAL) BY MOUTH DAILY. OFFICE VISIT REQUIRED PRIOR TO ANY FURTHER REFILLS (Patient taking differently: Take 20 mg by mouth daily. ) 90 capsule 1  . furosemide (LASIX) 40 MG tablet Take 1 tablet (40 mg total) by mouth daily. May take an extra tablet for weight gain of 2 lb in a day and 5 lbs in a weeks. 95 tablet 3  . KLOR-CON M20 20 MEQ tablet Take 20 mEq by mouth daily.    Marland Kitchen  Menthol-Methyl Salicylate (MUSCLE RUB) 10-15 % CREA Apply 1 application topically as needed for muscle pain. 35 g 0  . metFORMIN (GLUCOPHAGE) 500 MG tablet 1/2 tablet nightly with dinner (Patient taking differently: Take 250 mg by mouth every morning. ) 90 tablet 1  . metoprolol succinate (TOPROL-XL) 25 MG 24 hr tablet TAKE 0.5 TABLETS (12.5 MG TOTAL) BY MOUTH DAILY. TAKE WITH OR IMMEDIATELY FOLLOWING A MEAL. 45 tablet 2  . Nutritional Supplements (JUICE PLUS FIBRE PO) Take 3 tablets by mouth at bedtime.    . OXYGEN 2lpm with sleep and exertion [employer]    . pravastatin (PRAVACHOL) 40 MG tablet TAKE 1 TABLET BY MOUTH EVERY DAY (Patient taking differently: Take 40 mg by mouth daily. ) 90 tablet 0  . Respiratory Therapy Supplies (FLUTTER) DEVI Use flutter device three times a day as instructed 1 each  0  . pantoprazole (PROTONIX) 40 MG tablet Take 1 tablet (40 mg total) by mouth 2 (two) times daily. 30 tablet 3  . Tiotropium Bromide-Olodaterol (STIOLTO RESPIMAT) 2.5-2.5 MCG/ACT AERS Inhale 2 puffs into the lungs daily. (Patient taking differently: Inhale 2 puffs into the lungs daily with lunch. ) 2 g 0   No facility-administered medications prior to visit.     Allergies:   Patient has no known allergies.   Social History   Socioeconomic History  . Marital status: Widowed    Spouse name: Not on file  . Number of children: 2  . Years of education: Not on file  . Highest education level: Not on file  Occupational History  . Occupation: Self employed- Academic librarian business -retired  Tobacco Use  . Smoking status: Former Smoker    Packs/day: 0.50    Years: 32.00    Pack years: 16.00    Types: Cigarettes    Start date: 1963    Quit date: 03/02/1994    Years since quitting: 25.4  . Smokeless tobacco: Never Used  Substance and Sexual Activity  . Alcohol use: No  . Drug use: No  . Sexual activity: Not Currently    Birth control/protection: None  Other Topics Concern  . Not on file  Social History Narrative  . Not on file   Social Determinants of Health   Financial Resource Strain:   . Difficulty of Paying Living Expenses: Not on file  Food Insecurity:   . Worried About Charity fundraiser in the Last Year: Not on file  . Ran Out of Food in the Last Year: Not on file  Transportation Needs:   . Lack of Transportation (Medical): Not on file  . Lack of Transportation (Non-Medical): Not on file  Physical Activity:   . Days of Exercise per Week: Not on file  . Minutes of Exercise per Session: Not on file  Stress:   . Feeling of Stress : Not on file  Social Connections:   . Frequency of Communication with Friends and Family: Not on file  . Frequency of Social Gatherings with Friends and Family: Not on file  . Attends Religious Services: Not on file  . Active Member of Clubs or  Organizations: Not on file  . Attends Archivist Meetings: Not on file  . Marital Status: Not on file     Family History:  The patient's \  family history includes Congestive Heart Failure in her brother, father, and mother; Diabetes in her brother, father, and mother; Heart attack in her father; Heart disease in her father and mother; Hyperlipidemia in her  father and mother; Hypertension in her father and mother.   ROS:   Please see the history of present illness.    ROS for other systems are reviewed and are negative   PHYSICAL EXAM:   VS:  BP 124/64   Pulse 74   Temp (!) 97.3 F (36.3 C)   Ht 5\' 7"  (1.702 m)   Wt 164 lb 3.2 oz (74.5 kg)   SpO2 95%   BMI 25.72 kg/m       General: Alert, oriented x3, no distress, prominent scoliosis.  She has lost weight and her pacemaker is more prominent than before but appears healthy. Head: no evidence of trauma, PERRL, EOMI, no exophtalmos or lid lag, no myxedema, no xanthelasma; normal ears, nose and oropharynx Neck: normal jugular venous pulsations and no hepatojugular reflux; brisk carotid pulses without delay and no carotid bruits Chest: clear to auscultation, no signs of consolidation by percussion or palpation, normal fremitus, symmetrical and full respiratory excursions Cardiovascular: normal position and quality of the apical impulse, regular rhythm, normal first and paradoxically split second heart sounds, no murmurs, rubs or gallops Abdomen: no tenderness or distention, no masses by palpation, no abnormal pulsatility or arterial bruits, normal bowel sounds, no hepatosplenomegaly Extremities: no clubbing, cyanosis or edema; 2+ radial, ulnar and brachial pulses bilaterally; 2+ right femoral, posterior tibial and dorsalis pedis pulses; 2+ left femoral, posterior tibial and dorsalis pedis pulses; no subclavian or femoral bruits Neurological: grossly nonfocal Psych: Normal mood and affect    Wt Readings from Last 3  Encounters:  08/19/19 164 lb 3.2 oz (74.5 kg)  08/01/19 162 lb 11.2 oz (73.8 kg)  07/04/19 162 lb 4.8 oz (73.6 kg)    Studies/Labs Reviewed:   Coronary CT Angio 06/04/2019: Dilated right pulmonary artery (73mm) and left pulmonary artery (34mm)  IMPRESSION: 1. Coronary calcium score of 179. This was 74 percentile for age and sex matched control.  2. Normal coronary origin with right dominance.  3. Nonobstructive CAD. There is calcified plaque in the mid LAD causing moderate (50-69%) stenosis. CT-FFR across this lesion is 0.85, suggesting lesion is not functionally significant. The plaque extends into the ostium of a diagonal branch, causing moderate (50-69%) stenosis. CT-FFR across the diagonal branch is 0.84, suggesting no functional significance  4. There is step artifact in the mid RCA. While no plaque is seen in this segment, definitive interpretation is precluded by artifact  CAD-RADS 3. Moderate stenosis. Consider symptom-guided anti-ischemic pharmacotherapy as well as risk factor modification per guideline directed care. Additional analysis with CT FFR will be submitted.  ECHO 05/13/2019:  1. Moderate hypokinesis of the left ventricular, entire inferoseptal wall.  2. The left ventricle has mildly reduced systolic function, with an ejection fraction of 45-50%. The cavity size was mild to moderately dilated. Left ventricular diastolic Doppler parameters are consistent with pseudonormalization. Elevated left atrial  and left ventricular end-diastolic pressures The E/e' is 16. There is abnormal septal motion consistent with RV pacemaker. Left ventricular diffuse hypokinesis.  3. The right ventricle has normal systolic function. The cavity was mildly enlarged. There is no increase in right ventricular wall thickness. Right ventricular systolic pressure is mildly elevated.  4. Left atrial size was severely dilated.  5. Right atrial size was mildly dilated.  6. The mitral  valve is abnormal. Moderate thickening of the mitral valve leaflet. Moderate calcification of the mitral valve leaflet. Mitral valve regurgitation is moderate by color flow Doppler.  7. Tethered posterior leaflet.  8. Tricuspid valve  regurgitation is moderate.  9. The aortic valve is tricuspid. Moderate thickening of the aortic valve. Moderate calcification of the aortic valve. Aortic valve regurgitation is mild to moderate by color flow Doppler. Mild stenosis of the aortic valve. 10. The aorta is normal unless otherwise noted. 11. The aortic root, ascending aorta and aortic arch are normal in size and structure.  SUMMARY   Since last study, LV cavity more dilated, EF lower. There is septal dyssynchrony from pacing, but the inferoseptum also does not thicken. Posterior mitral valve leaflet appears tethered.  EKG:  EKG is ordered today.  It shows mostly atrial sensed ventricular paced rhythm with occasional PVCs followed by dual AV pacing.  The QRS is only moderately brought out 160 ms, QTC 514 ms Recent Labs: 05/05/2019: Pro B Natriuretic peptide (BNP) 1,161.0 07/18/2019: TSH 2.050 07/28/2019: ALT 14; B Natriuretic Peptide 126.7 08/08/2019: BUN 24; Creatinine, Ser 0.77; Hemoglobin 10.2; Platelets 238; Potassium 4.0; Sodium 136   Lipid Panel    Component Value Date/Time   CHOL 160 07/18/2019 1028   TRIG 137 07/18/2019 1028   HDL 63 07/18/2019 1028   CHOLHDL 2.5 07/18/2019 1028   CHOLHDL 3.7 11/30/2007 0510   VLDL 35 11/30/2007 0510   LDLCALC 73 07/18/2019 1028   LDLDIRECT 76 07/18/2019 1028    ASSESSMENT:    1. Chronic combined systolic and diastolic heart failure ([place])   2. Coronary artery disease involving native coronary artery of native heart without angina pectoris   3. CHB (complete heart block) ([facility])   4. Pacemaker   5. COPD GOLD II   6. PAH (pulmonary artery hypertension) ([place])   7. Normocytic anemia   8. Peptic ulcer disease with hemorrhage    PLAN:  In order of  problems listed above:  1. CHF: Recent unexplained decrease in LVEF (mild at 45-50%).  Slight low EF is not unexpected with ventricular pacing, but was not seen until recently.  Does not appear to be in overt heart failure, does not have evidence of hypervolemia by physical exam.  Primarily limited by scoliosis and COPD rather than heart failure.  On low-dose metoprolol.  We will restart losartan 25 mg once daily which was stopped during her recent hospitalization. 2. CAD: She has had some progression in coronary disease compared to remote coronary angiography, based on recent coronary CT angiogram, but no revascularization is necessary.  Focus on treatment of risk factors.  Continue her statin.  Most recent LDL cholesterol 76 was very close to target.  Hemoglobin A1c is acceptable at 6.2%. 3. CHB: pacemaker dependent.  4. PM: Normal device function, remote downloads every 3 months and yearly office visits 5. COPD:  Follows with Dr. Melvyn Novas, she was using oxygen at home 100% of the time at her last appointment, but is improving, may be due to treatment for her anemia. 6. PAH: Previous right heart catheterization showed that she had mild pulmonary hypertension in the absence of elevation in left heart pressures.  Pulmonary vascular disease is primarily driven by her chronic lung disease. 7. Anemia: Labs from October showed at most mild iron deficiency.  She is taking a folate acid rich supplement. 8. Peptic ulcer disease: Pathology confirmed the presence of Helicobacter pylori.  I think she has completed treatment for this (doxycycline, Pepto-Bismol, PPI).  She has a follow-up visit with Dr. Ardis Hughs in January.  We will keep off aspirin until he gives [place] the go ahead.    Medication Adjustments/Labs and Tests Ordered: Current medicines are  reviewed at length with the patient today.  Concerns regarding medicines are outlined above.  Medication changes, Labs and Tests ordered today are listed in the Patient  Instructions below. Patient Instructions  Medication Instructions:  START LOSARTAN 25 MG 1 TAB EVERY DAY  *If you need a refill on your cardiac medications before your next appointment, please call your pharmacy*  Lab Work:  Testing/Procedures: NONE  Follow-Up: At [facility], you and your health needs are our priority.  As part of our continuing mission to provide you with exceptional heart care, we have created designated Provider Care Teams.  These Care Teams include your primary Cardiologist (physician) and Advanced Practice Providers (APPs -  Physician Assistants and Nurse Practitioners) who all work together to provide you with the care you need, when you need it.  Your next appointment:   6 month(s)  The format for your next appointment:   Either In Person or Virtual  Provider:   Sanda Klein, MD  Other Instructions      Signed, Sanda Klein, MD  08/19/2019 7:06 PM    [facility] [address], [place], [place]  [address] Phone: [phone]; Fax: [phone]

## 2019-09-07 ENCOUNTER — Ambulatory Visit (INDEPENDENT_AMBULATORY_CARE_PROVIDER_SITE_OTHER): Payer: Medicare Other | Admitting: *Deleted

## 2019-09-07 DIAGNOSIS — I5042 Chronic combined systolic (congestive) and diastolic (congestive) heart failure: Secondary | ICD-10-CM | POA: Diagnosis not present

## 2019-09-07 LAB — CUP PACEART REMOTE DEVICE CHECK
Battery Remaining Longevity: 120 mo
Battery Remaining Percentage: 95.5 %
Battery Voltage: 2.99 V
Brady Statistic AP VP Percent: 11 %
Brady Statistic AP VS Percent: 1 %
Brady Statistic AS VP Percent: 88 %
Brady Statistic AS VS Percent: 1 %
Brady Statistic RA Percent Paced: 10 %
Brady Statistic RV Percent Paced: 99 %
Date Time Interrogation Session: 20210106025716
Implantable Lead Implant Date: 20090401
Implantable Lead Implant Date: 20090401
Implantable Lead Location: 753859
Implantable Lead Location: 753860
Implantable Pulse Generator Implant Date: 20190925
Lead Channel Impedance Value: 440 Ohm
Lead Channel Impedance Value: 480 Ohm
Lead Channel Pacing Threshold Amplitude: 0.5 V
Lead Channel Pacing Threshold Amplitude: 0.875 V
Lead Channel Pacing Threshold Pulse Width: 0.5 ms
Lead Channel Pacing Threshold Pulse Width: 0.5 ms
Lead Channel Sensing Intrinsic Amplitude: 12 mV
Lead Channel Sensing Intrinsic Amplitude: 3.8 mV
Lead Channel Setting Pacing Amplitude: 1.125
Lead Channel Setting Pacing Amplitude: 2 V
Lead Channel Setting Pacing Pulse Width: 0.5 ms
Lead Channel Setting Sensing Sensitivity: 4 mV
Pulse Gen Model: 2272
Pulse Gen Serial Number: 9064942

## 2019-09-08 ENCOUNTER — Other Ambulatory Visit: Payer: Self-pay | Admitting: Cardiovascular Disease

## 2019-09-09 ENCOUNTER — Ambulatory Visit (INDEPENDENT_AMBULATORY_CARE_PROVIDER_SITE_OTHER): Payer: Medicare Other | Admitting: Gastroenterology

## 2019-09-09 ENCOUNTER — Other Ambulatory Visit (INDEPENDENT_AMBULATORY_CARE_PROVIDER_SITE_OTHER): Payer: Medicare Other

## 2019-09-09 ENCOUNTER — Encounter: Payer: Self-pay | Admitting: Gastroenterology

## 2019-09-09 VITALS — BP 120/60 | HR 68 | Temp 97.1°F | Ht 64.0 in | Wt 159.0 lb

## 2019-09-09 DIAGNOSIS — K264 Chronic or unspecified duodenal ulcer with hemorrhage: Secondary | ICD-10-CM | POA: Diagnosis not present

## 2019-09-09 DIAGNOSIS — K269 Duodenal ulcer, unspecified as acute or chronic, without hemorrhage or perforation: Secondary | ICD-10-CM

## 2019-09-09 LAB — CBC WITH DIFFERENTIAL/PLATELET
Basophils Absolute: 0 10*3/uL (ref 0.0–0.1)
Basophils Relative: 0.4 % (ref 0.0–3.0)
Eosinophils Absolute: 0.2 10*3/uL (ref 0.0–0.7)
Eosinophils Relative: 2.7 % (ref 0.0–5.0)
HCT: 35.8 % — ABNORMAL LOW (ref 36.0–46.0)
Hemoglobin: 11.7 g/dL — ABNORMAL LOW (ref 12.0–15.0)
Lymphocytes Relative: 25 % (ref 12.0–46.0)
Lymphs Abs: 2 10*3/uL (ref 0.7–4.0)
MCHC: 32.6 g/dL (ref 30.0–36.0)
MCV: 90.1 fl (ref 78.0–100.0)
Monocytes Absolute: 0.5 10*3/uL (ref 0.1–1.0)
Monocytes Relative: 5.9 % (ref 3.0–12.0)
Neutro Abs: 5.2 10*3/uL (ref 1.4–7.7)
Neutrophils Relative %: 66 % (ref 43.0–77.0)
Platelets: 178 10*3/uL (ref 150.0–400.0)
RBC: 3.97 Mil/uL (ref 3.87–5.11)
RDW: 15.1 % (ref 11.5–15.5)
WBC: 7.9 10*3/uL (ref 4.0–10.5)

## 2019-09-09 MED ORDER — PANTOPRAZOLE SODIUM 40 MG PO TBEC: 40.0000 mg | DELAYED_RELEASE_TABLET | Freq: Every day | ORAL | 3 refills | Status: DC

## 2019-09-09 NOTE — Patient Instructions (Signed)
Your provider has requested that you go to the basement level for lab work before leaving today. Press "B" on the elevator. The lab is located at the first door on the left as you exit the elevator.  We have sent the following medications to your pharmacy for you to pick up at your convenience:  Protonix.  Do not forget - do not take any NSAIDS at all.  Take Tylenol for your pain.    We will contact you after we get your lab results to let you know if you should restart your baby aspirin.  Due to recent changes in healthcare laws, you may see the results of your imaging and laboratory studies on MyChart before your provider has had a chance to review them.  We understand that in some cases there may be results that are confusing or concerning to you. Not all laboratory results come back in the same time frame and the provider may be waiting for multiple results in order to interpret others.  Please give [place] 48 hours in order for your provider to thoroughly review all the results before contacting the office for clarification of your results.

## 2019-09-09 NOTE — Progress Notes (Signed)
Review of pertinent gastrointestinal problems: 1.  Bleeding duodenal ulcers.  November 2020 presented with melena, hemoglobin 6.8.  EGD Dr. Fuller Plan found to duodenal ulcers with VV, treated with cautery, as well as H. pylori positive gastritis on biopsy. She was also on a fair amount of NSAIDs and also baby aspirin daily appropriate antibiotics completed.  Received 2 unist blood transfusion.   HPI: This is a very pleasant 79 year old woman who is here in a wheelchair today. She was last seen about 2 months ago when she was hospitalized with bleeding duodenal ulcer. EGD as above. She was H. pylori positive on biopsy. She had also been taking a fair amount of NSAIDs.  Since leaving the hospital she completed H. pylori eradication antibiotics. Her stools of the past several weeks have been completely normal. She feels overall quite a bit better.  Last CBC was about a week after she was discharged and her hemoglobin was 10.3  Chief complaint is duodenal ulcer  ROS: complete GI ROS as described in HPI, all other review negative.  Constitutional:  No unintentional weight loss   Past Medical History:  Diagnosis Date  . Arthritis    "hands, spine" (12/07/2014)  . CHB (complete heart block) ([place]) 09/19/2013  . CHF (congestive heart failure) ([place]) dx'd 12/06/2014  . Chronic pain    "since neck OR in 2007"  . Coronary artery disease    normal coronaries by 09/10/00 cath  . Diabetes mellitus    hga1c 7.1 no meds  . Dysrhythmia   . HTN (hypertension) 09/19/2013  . Hypercholesterolemia   . Hyperlipidemia 09/19/2013  . Hypertension   . Myocardial infarction ([place]) dx'd 2009  . Neuropathy of both feet   . Pacemaker   . Paralysis of right hand ([place]) 2007   "after spinal cord injury/OR"  . Shortness of breath    exertional  . Type II diabetes mellitus ([place])    "had it years ago; lost weight after neck OR in 2007; glucose went way down; recently dx'd as returned recently" (12/07/2014)  . Urinary  incontinence    "since 2007's OR"  . Venous insufficiency (chronic) (peripheral)     Past Surgical History:  Procedure Laterality Date  . ANTERIOR CERVICAL DECOMP/DISCECTOMY FUSION  2007  . BACK SURGERY    . BIOPSY  07/29/2019   Procedure: BIOPSY;  Surgeon: Ladene Artist, MD;  Location: [facility] ENDOSCOPY;  Service: Endoscopy;;  . CARDIAC CATHETERIZATION  09/10/2000   normal Coronary arteries, EF60%,   . CARDIAC PACEMAKER PLACEMENT  12/01/2007   St.Jude Zephyr Elta Guadeloupe [id number], dual chamber  . CARDIOVASCULAR STRESS TEST  08/28/2000   Cardiolite perfusion study, EF58%, low risk study,   . DILATION AND CURETTAGE OF UTERUS  1960's  . ESOPHAGOGASTRODUODENOSCOPY N/A 07/29/2019   Procedure: ESOPHAGOGASTRODUODENOSCOPY (EGD);  Surgeon: Ladene Artist, MD;  Location[facility] ENDOSCOPY;  Service: Endoscopy;  Laterality: N/A;  . HOT HEMOSTASIS N/A 07/29/2019   Procedure: HOT HEMOSTASIS (ARGON PLASMA COAGULATION/BICAP);  Surgeon: Ladene Artist, MD;  [facility]: [facility] ENDOSCOPY;  Service: Endoscopy;  Laterality: N/A;  . KNEE ARTHROSCOPY Right   . LAPAROSCOPIC CHOLECYSTECTOMY  1995  . LEFT AND RIGHT HEART CATHETERIZATION WITH CORONARY ANGIOGRAM N/A 12/14/2014   Procedure: LEFT AND RIGHT HEART CATHETERIZATION WITH CORONARY ANGIOGRAM;  Surgeon: Peter M Martinique, MD;  Location: [facility] CATH LAB;  Service: Cardiovascular;  Laterality: N/A;  . Lower Ext. Dopplers  06/12/2011   no evidence of thrombus, all vessels normal in size, no evidence of insuffiency  .  LUMBAR LAMINECTOMY/DECOMPRESSION MICRODISCECTOMY  03/02/2012   Procedure: LUMBAR LAMINECTOMY/DECOMPRESSION MICRODISCECTOMY 2 LEVELS;  Surgeon: Floyce Stakes, MD;  Location: [facility] NEURO ORS;  Service: Neurosurgery;  Laterality: Bilateral;  Lumbar three, lumbar four, lumbar five Laminectomy/Foraminotomy  . MOLE REMOVAL     left side of face near her eye  . NM MYOCAR PERF WALL MOTION  10/24/2008   protocol:Lexiscan, EF63%, normal study, scan negative for ischemia  . PPM  GENERATOR CHANGEOUT N/A 05/26/2018   Procedure: PPM GENERATOR CHANGEOUT;  Surgeon: Sanda Klein, MD;  Location: [facility] CV LAB;  Service: Cardiovascular;  Laterality: N/A;  . TRANSTHORACIC ECHOCARDIOGRAM  11/30/2007   normal echo, mean transaortic valve gradient 48mmHg    Current Outpatient Medications  Medication Sig Dispense Refill  . acetaminophen (TYLENOL) 500 MG tablet Take 500 mg by mouth every 6 (six) hours as needed for mild pain.     . DULoxetine (CYMBALTA) 20 MG capsule TAKE 1 CAPSULE (20 MG TOTAL) BY MOUTH DAILY. OFFICE VISIT REQUIRED PRIOR TO ANY FURTHER REFILLS (Patient taking differently: Take 20 mg by mouth daily. ) 90 capsule 1  . furosemide (LASIX) 40 MG tablet Take 1 tablet (40 mg total) by mouth daily. May take an extra tablet for weight gain of 2 lb in a day and 5 lbs in a weeks. 95 tablet 3  . KLOR-CON M20 20 MEQ tablet Take 20 mEq by mouth daily.    Marland Kitchen losartan (COZAAR) 25 MG tablet Take 1 tablet (25 mg total) by mouth daily. 90 tablet 3  . Menthol-Methyl Salicylate (MUSCLE RUB) 10-15 % CREA Apply 1 application topically as needed for muscle pain. 35 g 0  . metFORMIN (GLUCOPHAGE) 500 MG tablet 1/2 tablet nightly with dinner (Patient taking differently: Take 250 mg by mouth every morning. ) 90 tablet 1  . metoprolol succinate (TOPROL-XL) 25 MG 24 hr tablet TAKE 0.5 TABLETS (12.5 MG TOTAL) BY MOUTH DAILY. TAKE WITH OR IMMEDIATELY FOLLOWING A MEAL. 45 tablet 2  . Nutritional Supplements (JUICE PLUS FIBRE PO) Take 3 tablets by mouth at bedtime.    . OXYGEN 2lpm with sleep and exertion [employer]    . pravastatin (PRAVACHOL) 40 MG tablet TAKE 1 TABLET BY MOUTH EVERY DAY (Patient taking differently: Take 40 mg by mouth daily. ) 90 tablet 0  . Respiratory Therapy Supplies (FLUTTER) DEVI Use flutter device three times a day as instructed 1 each 0   No current facility-administered medications for this visit.    Allergies as of 09/09/2019  . (No Known Allergies)     Family History  Problem Relation Age of Onset  . Diabetes Father   . Heart disease Father        before age 75  . Hyperlipidemia Father   . Hypertension Father   . Heart attack Father   . Congestive Heart Failure Father   . Diabetes Mother   . Heart disease Mother   . Hyperlipidemia Mother   . Hypertension Mother   . Congestive Heart Failure Mother   . Diabetes Brother   . Congestive Heart Failure Brother   . Colon cancer Neg Hx   . Esophageal cancer Neg Hx   . Rectal cancer Neg Hx     Social History   Socioeconomic History  . Marital status: Widowed    Spouse name: Not on file  . Number of children: 2  . Years of education: Not on file  . Highest education level: Not on file  Occupational History  . Occupation:  Self employed- Academic librarian business -retired  Tobacco Use  . Smoking status: Former Smoker    Packs/day: 0.50    Years: 32.00    Pack years: 16.00    Types: Cigarettes    Start date: 1963    Quit date: 03/02/1994    Years since quitting: 25.5  . Smokeless tobacco: Never Used  Substance and Sexual Activity  . Alcohol use: No  . Drug use: No  . Sexual activity: Not Currently    Birth control/protection: None  Other Topics Concern  . Not on file  Social History Narrative  . Not on file   Social Determinants of Health   Financial Resource Strain:   . Difficulty of Paying Living Expenses: Not on file  Food Insecurity:   . Worried About Charity fundraiser in the Last Year: Not on file  . Ran Out of Food in the Last Year: Not on file  Transportation Needs:   . Lack of Transportation (Medical): Not on file  . Lack of Transportation (Non-Medical): Not on file  Physical Activity:   . Days of Exercise per Week: Not on file  . Minutes of Exercise per Session: Not on file  Stress:   . Feeling of Stress : Not on file  Social Connections:   . Frequency of Communication with Friends and Family: Not on file  . Frequency of Social Gatherings with Friends and  Family: Not on file  . Attends Religious Services: Not on file  . Active Member of Clubs or Organizations: Not on file  . Attends Archivist Meetings: Not on file  . Marital Status: Not on file  Intimate Partner Violence:   . Fear of Current or Ex-Partner: Not on file  . Emotionally Abused: Not on file  . Physically Abused: Not on file  . Sexually Abused: Not on file     Physical Exam: Temp (!) 97.1 F (36.2 C)   Ht 5\' 4"  (1.626 m)   Wt 159 lb (72.1 kg) Comment: Reported  BMI 27.29 kg/m  Constitutional: generally well-appearing Psychiatric: alert and oriented x3 Abdomen: soft, nontender, nondistended, no obvious ascites, no peritoneal signs, normal bowel sounds No peripheral edema noted in lower extremities  Assessment and plan: 79 y.o. female with duodenal ulcers, H. pylori positive, also NSAID and baby aspirin use  Seems the bleeding has clearly stopped and has not recurred. Etiology likely combined NSAID, baby aspirin use and H. pylori positive gastritis. She completed H. pylori antibiotics. We will recheck her hemoglobin and check her H. pylori stool antigen status to see if she is then properly eradicated. I recommend she continue once daily proton pump inhibitor which she actually stopped after her H. pylori treatment. She will not begin the proton pump inhibitor until after her H. pylori stool testing is done. She knows to stay away from NSAIDs in the future and get Tylenol only person for routine aches and pains.  Depending on her blood counts I will likely authorize her to resume her baby aspirin once daily. Please see the "Patient Instructions" section for addition details about the plan.  Owens Loffler, MD [facility] 09/09/2019, 1:36 PM

## 2019-09-12 ENCOUNTER — Other Ambulatory Visit: Payer: Medicare Other

## 2019-09-12 DIAGNOSIS — K264 Chronic or unspecified duodenal ulcer with hemorrhage: Secondary | ICD-10-CM | POA: Diagnosis not present

## 2019-09-13 LAB — HELICOBACTER PYLORI  SPECIAL ANTIGEN
MICRO NUMBER:: 10027862
RESULT:: DETECTED — AB
SPECIMEN QUALITY: ADEQUATE

## 2019-09-15 ENCOUNTER — Telehealth: Payer: Self-pay | Admitting: Gastroenterology

## 2019-09-15 ENCOUNTER — Other Ambulatory Visit: Payer: Self-pay

## 2019-09-15 DIAGNOSIS — M170 Bilateral primary osteoarthritis of knee: Secondary | ICD-10-CM | POA: Diagnosis not present

## 2019-09-15 DIAGNOSIS — M1712 Unilateral primary osteoarthritis, left knee: Secondary | ICD-10-CM | POA: Diagnosis not present

## 2019-09-15 MED ORDER — CLARITHROMYCIN 500 MG PO TABS: 500.0000 mg | ORAL_TABLET | Freq: Two times a day (BID) | ORAL | 0 refills | Status: DC

## 2019-09-15 MED ORDER — AMOXICILLIN 500 MG PO TABS: 1000.0000 mg | ORAL_TABLET | Freq: Two times a day (BID) | ORAL | 0 refills | Status: AC

## 2019-09-15 NOTE — Telephone Encounter (Signed)
The pt has been advised that the results have not been reviewed and we will call as soon as available.

## 2019-09-26 ENCOUNTER — Telehealth: Payer: Self-pay | Admitting: Gastroenterology

## 2019-09-26 NOTE — Telephone Encounter (Signed)
The pt daughter asked if she should have the pt complete a stool kit now for H pylori or wait until she is seen on 2/4 to speak with Janett Billow. I advised that she should wait to retest after she has finished her abx and stopped PPI.  She should keep the appt she made today to see Janett Billow on 2/4 if her mother is having continued symptoms.  The pt has been advised of the information and verbalized understanding.

## 2019-09-26 NOTE — Telephone Encounter (Signed)
Patients daughter Otila Kluver calling- states that she thinks patient needs to come back in and get checked again for H. pylori. She does not feel like the medication is working or that it has gotten better. She scheduled appointment for the patient for 2/4 with Janett Billow but asking if she should wait that long or come in sooner.

## 2019-09-27 ENCOUNTER — Other Ambulatory Visit: Payer: Self-pay | Admitting: Adult Health

## 2019-10-06 ENCOUNTER — Ambulatory Visit (INDEPENDENT_AMBULATORY_CARE_PROVIDER_SITE_OTHER): Payer: Medicare Other | Admitting: Gastroenterology

## 2019-10-06 ENCOUNTER — Encounter: Payer: Self-pay | Admitting: Gastroenterology

## 2019-10-06 ENCOUNTER — Telehealth: Payer: Self-pay

## 2019-10-06 VITALS — BP 102/54 | HR 60 | Ht 64.0 in | Wt 157.0 lb

## 2019-10-06 DIAGNOSIS — K279 Peptic ulcer, site unspecified, unspecified as acute or chronic, without hemorrhage or perforation: Secondary | ICD-10-CM | POA: Diagnosis not present

## 2019-10-06 DIAGNOSIS — K264 Chronic or unspecified duodenal ulcer with hemorrhage: Secondary | ICD-10-CM | POA: Diagnosis not present

## 2019-10-06 DIAGNOSIS — Z8619 Personal history of other infectious and parasitic diseases: Secondary | ICD-10-CM | POA: Diagnosis not present

## 2019-10-06 NOTE — Patient Instructions (Signed)
If you are age 81 or older, your body mass index should be between 23-30. Your Body mass index is 26.95 kg/m. If this is out of the aforementioned range listed, please consider follow up with your Primary Care Provider.  Your provider has requested that you go to the basement level for lab. Press "B" on the elevator. The lab is located at the first door on the left as you exit the elevator. Please return stool sample on or around 11/21/19.   Thank you for choosing me and [facility].  Janett Billow Zehr-PA

## 2019-10-06 NOTE — Progress Notes (Signed)
10/06/2019 [name] [id number] May 14, 1941   Review of pertinent gastrointestinal problems: 1.  Bleeding duodenal ulcers.  November 2020 presented with melena, hemoglobin 6.8.  EGD Dr. Fuller Plan found to duodenal ulcers with VV, treated with cautery, as well as H. pylori positive gastritis on biopsy. She was also on a fair amount of NSAIDs and also baby aspirin daily appropriate antibiotics completed.  Received 2 unist blood transfusion.   HISTORY OF PRESENT ILLNESS: This is an 79 year old female who is a patient of Dr. Ardis Hughs.  Since her last visit with Dr. Ardis Hughs on January 8 she was retested for H. pylori with stool antigen.  This was positive and she was retreated with 2 weeks of amoxicillin, clarithromycin, and PPI.  She was advised that they would recheck stool antigen two months after completing this treatment.  I think that there was some confusion and she comes in here today thinking that they would be rechecking her.  Her daughter reports that her mother continues to lose weight despite her good appetite.  Overall the patient says that she feels well and eats everything that she is given.  There is been no black or bloody stools.  According to our scale she has lost about 5 pounds since November, but her daughter believes according to their scale at home she is lost about 10 pounds.  She also has multiple other medical problems as listed below including heart failure for which she is on low-sodium diet and takes Lasix, which they titrate as needed.   Past Medical History:  Diagnosis Date  . Arthritis    "hands, spine" (12/07/2014)  . CHB (complete heart block) ([place]) 09/19/2013  . CHF (congestive heart failure) ([place]) dx'd 12/06/2014  . Chronic pain    "since neck OR in 2007"  . Coronary artery disease    normal coronaries by 09/10/00 cath  . Diabetes mellitus    hga1c 7.1 no meds  . Dysrhythmia   . HTN (hypertension) 09/19/2013  . Hypercholesterolemia   . Hyperlipidemia 09/19/2013    . Hypertension   . Myocardial infarction ([place]) dx'd 2009  . Neuropathy of both feet   . Pacemaker   . Paralysis of right hand ([place]) 2007   "after spinal cord injury/OR"  . Shortness of breath    exertional  . Type II diabetes mellitus ([place])    "had it years ago; lost weight after neck OR in 2007; glucose went way down; recently dx'd as returned recently" (12/07/2014)  . Urinary incontinence    "since 2007's OR"  . Venous insufficiency (chronic) (peripheral)    Past Surgical History:  Procedure Laterality Date  . ANTERIOR CERVICAL DECOMP/DISCECTOMY FUSION  2007  . BACK SURGERY    . BIOPSY  07/29/2019   Procedure: BIOPSY;  Surgeon: Ladene Artist, MD;  Location: [facility] ENDOSCOPY;  Service: Endoscopy;;  . CARDIAC CATHETERIZATION  09/10/2000   normal Coronary arteries, EF60%,   . CARDIAC PACEMAKER PLACEMENT  12/01/2007   St.Jude Zephyr Elta Guadeloupe [id number], dual chamber  . CARDIOVASCULAR STRESS TEST  08/28/2000   Cardiolite perfusion study, EF58%, low risk study,   . DILATION AND CURETTAGE OF UTERUS  1960's  . ESOPHAGOGASTRODUODENOSCOPY N/A 07/29/2019   Procedure: ESOPHAGOGASTRODUODENOSCOPY (EGD);  Surgeon: Ladene Artist, MD;  Location: [facility] ENDOSCOPY;  Service: Endoscopy;  Laterality: N/A;  . HOT HEMOSTASIS N/A 07/29/2019   Procedure: HOT HEMOSTASIS (ARGON PLASMA COAGULATION/BICAP);  Surgeon: Ladene Artist, MD;  Location: [facility] ENDOSCOPY;  Service: Endoscopy;  Laterality: N/A;  . KNEE ARTHROSCOPY Right   . LAPAROSCOPIC CHOLECYSTECTOMY  1995  . LEFT AND RIGHT HEART CATHETERIZATION WITH CORONARY ANGIOGRAM N/A 12/14/2014   Procedure: LEFT AND RIGHT HEART CATHETERIZATION WITH CORONARY ANGIOGRAM;  Surgeon: Peter M Martinique, MD;  Location: [facility] CATH LAB;  Service: Cardiovascular;  Laterality: N/A;  . Lower Ext. Dopplers  06/12/2011   no evidence of thrombus, all vessels normal in size, no evidence of insuffiency  . LUMBAR LAMINECTOMY/DECOMPRESSION MICRODISCECTOMY  03/02/2012   Procedure: LUMBAR  LAMINECTOMY/DECOMPRESSION MICRODISCECTOMY 2 LEVELS;  Surgeon: Floyce Stakes, MD;  Location: [place] NEURO ORS;  Service: Neurosurgery;  Laterality: Bilateral;  Lumbar three, lumbar four, lumbar five Laminectomy/Foraminotomy  . MOLE REMOVAL     left side of face near her eye  . NM MYOCAR PERF WALL MOTION  10/24/2008   protocol:Lexiscan, EF63%, normal study, scan negative for ischemia  . PPM GENERATOR CHANGEOUT N/A 05/26/2018   Procedure: PPM GENERATOR CHANGEOUT;  Surgeon: Sanda Klein, MD;  Location: [place] CV LAB;  Service: Cardiovascular;  Laterality: N/A;  . TRANSTHORACIC ECHOCARDIOGRAM  11/30/2007   normal echo, mean transaortic valve gradient 66mmHg    reports that she quit smoking about 25 years ago. Her smoking use included cigarettes. She started smoking about 58 years ago. She has a 16.00 pack-year smoking history. She has never used smokeless tobacco. She reports that she does not drink alcohol or use drugs. family history includes Congestive Heart Failure in her brother, father, and mother; Diabetes in her brother, father, and mother; Heart attack in her father; Heart disease in her father and mother; Hyperlipidemia in her father and mother; Hypertension in her father and mother. No Known Allergies    Outpatient Encounter Medications as of 10/06/2019  Medication Sig  . acetaminophen (TYLENOL) 500 MG tablet Take 500 mg by mouth every 6 (six) hours as needed for mild pain.   Marland Kitchen aspirin EC 81 MG tablet Take 81 mg by mouth daily.  . furosemide (LASIX) 40 MG tablet Take 1 tablet (40 mg total) by mouth daily. May take an extra tablet for weight gain of 2 lb in a day and 5 lbs in a weeks.  Marland Kitchen KLOR-CON M20 20 MEQ tablet Take 20 mEq by mouth daily.  Marland Kitchen losartan (COZAAR) 50 MG tablet Take 50 mg by mouth daily.  . metFORMIN (GLUCOPHAGE) 500 MG tablet 1/2 tablet nightly with dinner (Patient taking differently: Take 250 mg by mouth every morning. )  . metoprolol succinate (TOPROL-XL) 25 MG 24 hr  tablet TAKE 0.5 TABLETS (12.5 MG TOTAL) BY MOUTH DAILY. TAKE WITH OR IMMEDIATELY FOLLOWING A MEAL.  Marland Kitchen Nutritional Supplements (JUICE PLUS FIBRE PO) Take 3 tablets by mouth at bedtime.  . OXYGEN 2lpm with sleep and exertion [employer]  . pantoprazole (PROTONIX) 40 MG tablet Take 1 tablet (40 mg total) by mouth daily.  . pravastatin (PRAVACHOL) 40 MG tablet TAKE 1 TABLET BY MOUTH EVERY DAY  . Respiratory Therapy Supplies (FLUTTER) DEVI Use flutter device three times a day as instructed  . [DISCONTINUED] clarithromycin (BIAXIN) 500 MG tablet Take 1 tablet (500 mg total) by mouth 2 (two) times daily. (Patient not taking: Reported on 10/06/2019)  . [DISCONTINUED] DULoxetine (CYMBALTA) 20 MG capsule TAKE 1 CAPSULE (20 MG TOTAL) BY MOUTH DAILY. OFFICE VISIT REQUIRED PRIOR TO ANY FURTHER REFILLS (Patient not taking: Reported on 10/06/2019)  . [DISCONTINUED] losartan (COZAAR) 25 MG tablet Take 1 tablet (25 mg total) by mouth daily. (Patient not taking: Reported on 10/06/2019)  . [  DISCONTINUED] Menthol-Methyl Salicylate (MUSCLE RUB) 10-15 % CREA Apply 1 application topically as needed for muscle pain. (Patient not taking: Reported on 10/06/2019)   No facility-administered encounter medications on file as of 10/06/2019.     REVIEW OF SYSTEMS  : All other systems reviewed and negative except where noted in the History of Present Illness.   PHYSICAL EXAM: BP (!) 102/54 (BP Location: Left Arm, Patient Position: Sitting, Cuff Size: Normal)   Pulse 60   Ht 5\' 4"  (1.626 m)   Wt 157 lb (71.2 kg)   SpO2 96%   BMI 26.95 kg/m  General: Well developed white female in no acute distress; in wheelchair Head: Normocephalic and atraumatic Eyes:  Sclerae anicteric, conjunctiva pink. Ears: Normal auditory acuity Lungs: Clear throughout to auscultation; no increased WOB. Heart: Regular rate and rhythm; no M/R/G. Abdomen: Soft, non-distended.  BS present.  Non-tender. Musculoskeletal: Symmetrical with no gross deformities    Skin: No lesions on visible extremities Extremities: No edema  Neurological: Alert oriented x 4, grossly non-focal Psychological:  Alert and cooperative. Normal mood and affect  ASSESSMENT AND PLAN: *79 year old female with bleeding duodenal ulcers from visible vessel, H. pylori positive, previous NSAID and baby aspirin use.  H. pylori stool antigen was again positive so was then retreated.  We will plan for repeat stool antigen about the week of March 22.  She is to discontinue her Protonix 1 week prior to performing this stool test.  Can resume her Protonix once the stool study has been collected.  She will continue to avoid NSAIDs.  Her daughter will continue to monitor her weight at home.   CC:  Esaw Grandchild, NP

## 2019-10-06 NOTE — Telephone Encounter (Signed)
-----  Message from Loralie Champagne, PA-C sent at 10/06/2019  5:00 PM EST ----- I saw this patient in clinic today.  We did enter the order for her H. pylori stool study so that they could pick up the kit while they were here today, but they were advised not to perform the study until the week of March 22.  We did not remind them, however, that she needs to discontinue her Protonix 1 week prior to completing the stool antigen and then can resume once the stool has been collected.  We please call and remind the daughter of this instruction.  Thank you,  Jess

## 2019-10-07 NOTE — Progress Notes (Signed)
I agree with the above note, plan 

## 2019-10-07 NOTE — Telephone Encounter (Signed)
Tried to call pt but mailbox was full will try later on today

## 2019-10-10 NOTE — Telephone Encounter (Signed)
Tried again to call pt and mail box is full will send a My Chart message and mail letter

## 2019-10-13 DIAGNOSIS — M25562 Pain in left knee: Secondary | ICD-10-CM | POA: Diagnosis not present

## 2019-10-13 DIAGNOSIS — M1712 Unilateral primary osteoarthritis, left knee: Secondary | ICD-10-CM | POA: Diagnosis not present

## 2019-10-21 DIAGNOSIS — M179 Osteoarthritis of knee, unspecified: Secondary | ICD-10-CM | POA: Diagnosis not present

## 2019-10-21 DIAGNOSIS — M1712 Unilateral primary osteoarthritis, left knee: Secondary | ICD-10-CM | POA: Diagnosis not present

## 2019-10-27 ENCOUNTER — Other Ambulatory Visit: Payer: Self-pay | Admitting: Adult Health

## 2019-10-28 DIAGNOSIS — M25562 Pain in left knee: Secondary | ICD-10-CM | POA: Diagnosis not present

## 2019-10-28 DIAGNOSIS — M179 Osteoarthritis of knee, unspecified: Secondary | ICD-10-CM | POA: Diagnosis not present

## 2019-10-28 DIAGNOSIS — M1712 Unilateral primary osteoarthritis, left knee: Secondary | ICD-10-CM | POA: Diagnosis not present

## 2019-11-03 DIAGNOSIS — M1712 Unilateral primary osteoarthritis, left knee: Secondary | ICD-10-CM | POA: Diagnosis not present

## 2019-11-03 DIAGNOSIS — M25562 Pain in left knee: Secondary | ICD-10-CM | POA: Diagnosis not present

## 2019-11-04 IMAGING — CT CT HEART MORP W/ CTA COR W/ SCORE W/ CA W/CM &/OR W/O CM
4 of 7 series · 8 of 20 positions shown, 9 images · IV contrast (APPLIED)
Comparison: None.
COMPARISON: None.

Addendum:
EXAM:
OVER-READ INTERPRETATION  CT CHEST

The following report is an over-read performed by radiologist Dr.
over-read does not include interpretation of cardiac or coronary
anatomy or pathology. The cardiac CTA and coronary calcium score
interpretation by the cardiologist is attached.
CLINICAL DATA: Evaluate etiology of systolic dysfunction
Cardiac/Coronary  CTA
TECHNIQUE: The patient was scanned on a [employer] Force scanner.

[Series 6: best diast 66 % · axial · 0.39mm/px · z∈[-280,-226]mm · 2 of 402 slices shown, 3 images]
[im 134/402  vessel]
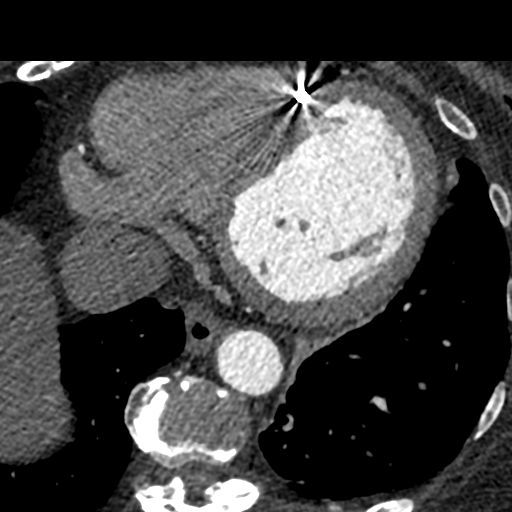
[im 134/402  lung]
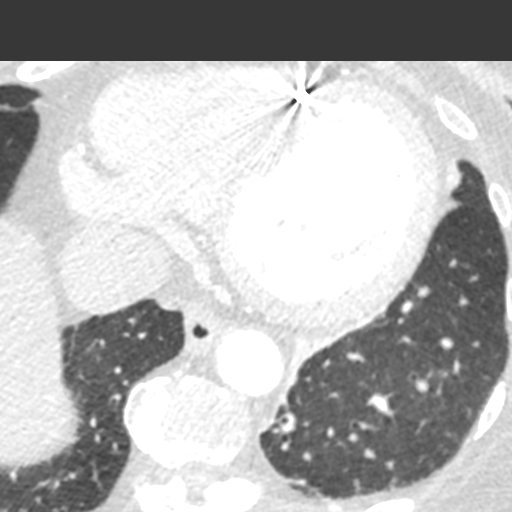
[im 268/402  vessel]
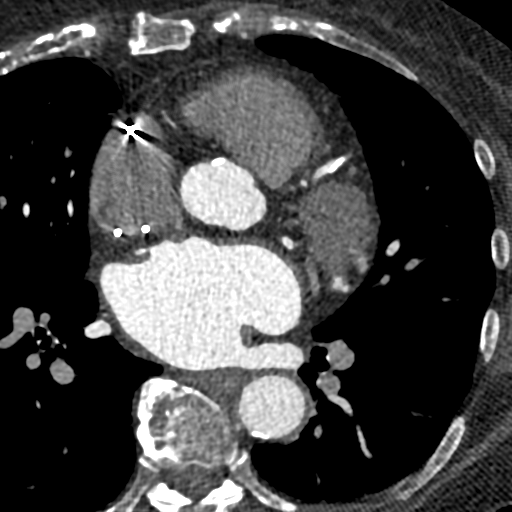

[Series 7: best syst 66 % · axial · 0.39mm/px · z∈[-280,-226]mm · 2 of 402 slices shown]
[im 134/402  vessel]
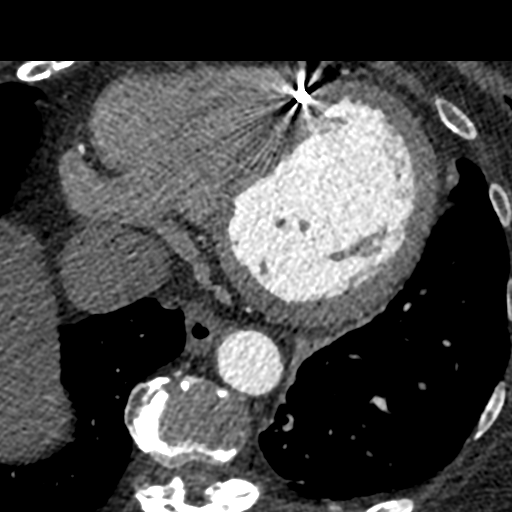
[im 268/402  vessel]
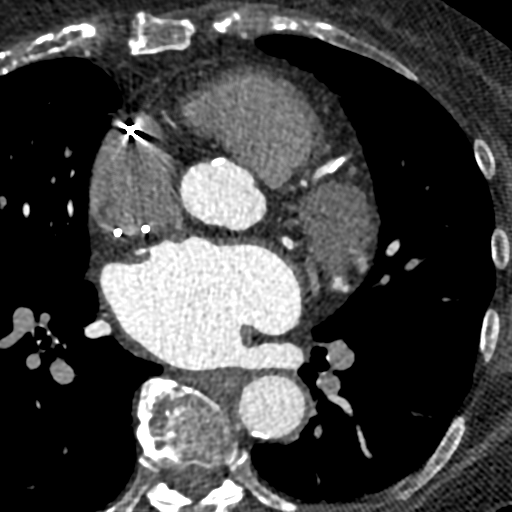

[Series 8: ts diast sharp 66 % · axial · 0.39mm/px · z∈[-280,-226]mm · 2 of 402 slices shown]
[im 134/402  lung]
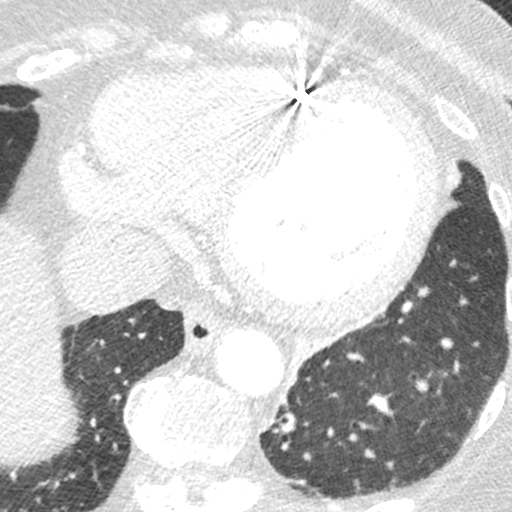
[im 268/402  lung]
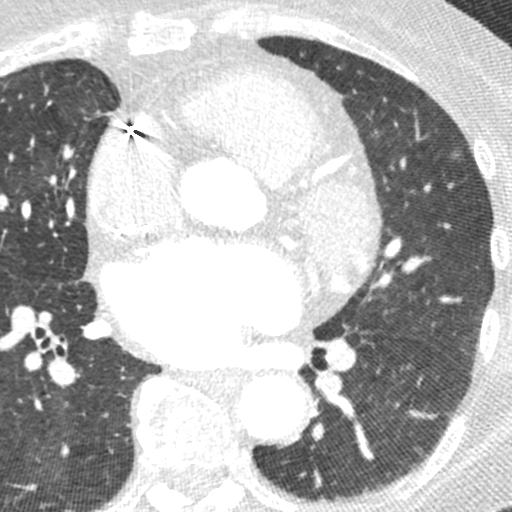

[Series 9: ts syst sharp 66 % · axial · 0.39mm/px · z∈[-280,-226]mm · 2 of 402 slices shown]
[im 134/402  lung]
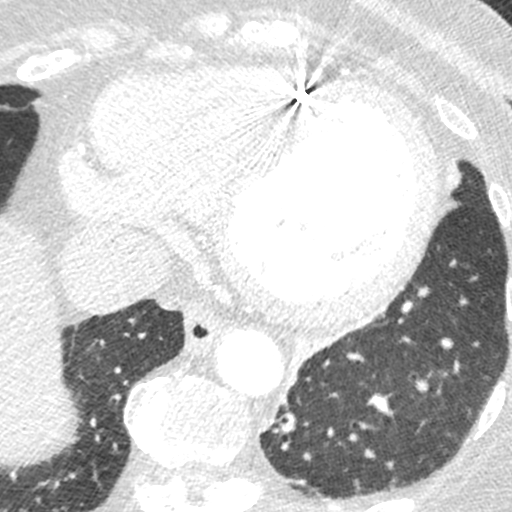
[im 268/402  lung]
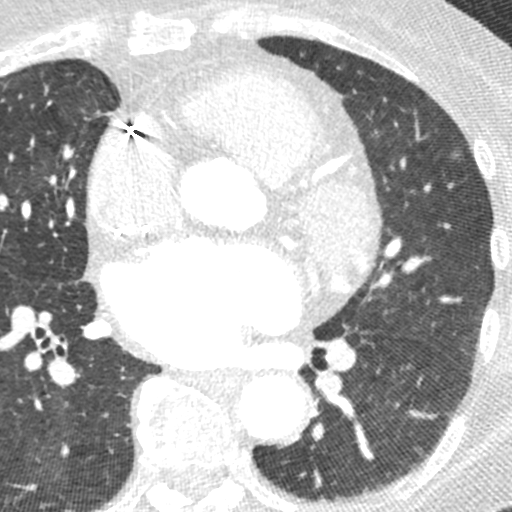

[8 of 20 positions shown; findings below may reference images not displayed]

FINDINGS: Aortic atherosclerosis. Within the visualized portions of the thorax
there are no suspicious appearing pulmonary nodules or masses, there
is no acute consolidative airspace disease, no pleural effusions, no
pneumothorax and no lymphadenopathy. Visualized portions of the
upper abdomen are unremarkable. There are no aggressive appearing
lytic or blastic lesions noted in the visualized portions of the
skeleton.
IMPRESSION: 1.  Aortic Atherosclerosis (SIURI-YC2.2).
FINDINGS: A 100 kV prospective scan was triggered in the descending thoracic
aorta at 111 HU's. Axial non-contrast 3 mm slices were carried out
through the heart. The data set was analyzed on a dedicated work
station and scored using the Agatson method. Gantry rotation speed
was 250 msecs and collimation was .6 mm. No beta blockade and 0.8 mg
of sl NTG was given. The 3D data set was reconstructed in 5%
intervals of the 67-82 % of the R-R cycle. Diastolic phases were
analyzed on a dedicated work station using MPR, MIP and VRT modes.
The patient received 80 cc of contrast.

Aorta:  Normal size.  Mild calcifications.

Aortic Valve:  Trileaflet.  No calcifications.

Coronary Arteries:  Normal coronary origin.  Right dominance.

RCA is a large dominant artery that gives rise to PDA and PLA. There
is calcified plaque at the ostium causing minimal (0-24%) stenosis.
There is motion artifact in the mid RCA precluding interpretation of
this segment, though no plaque clearly seen.

Left main is a large artery that gives rise to LAD and LCX arteries.

There is calcified plaque in the proximal LAD that causes minimal
(0-24%) stenosis. Calcified plaque in the mid LAD causing moderate
(50-69%) stenosis. Plaque extends into ostium of first diagonal
branch, causing moderate (50-69%) stenosis.

LCX is a non-dominant artery that gives rise to one large OM1
branch. There is no significant plaque.

Other findings:

Normal pulmonary vein drainage into the left atrium.

Dilated right pulmonary artery (35mm) and left pulmonary artery
(33mm)
IMPRESSION: 1. Coronary calcium score of 179. This was 62 percentile for age and
sex matched control.

2. Normal coronary origin with right dominance.

3. Nonobstructive CAD. There is calcified plaque in the mid LAD
causing moderate (50-69%) stenosis. CT-FFR across this lesion is
0.85, suggesting lesion is not functionally significant. The plaque
extends into the ostium of a diagonal branch, causing moderate
(50-69%) stenosis. CT-FFR across the diagonal branch is 0.84,
suggesting no functional significance

4. There is step artifact in the mid RCA. While no plaque is seen in
this segment, definitive interpretation is precluded by artifact

CAD-RADS 3. Moderate stenosis. Consider symptom-guided anti-ischemic
pharmacotherapy as well as risk factor modification per guideline
directed care. Additional analysis with CT FFR will be submitted.

*** End of Addendum ***
EXAM:
OVER-READ INTERPRETATION  CT CHEST

The following report is an over-read performed by radiologist Dr.
over-read does not include interpretation of cardiac or coronary
anatomy or pathology. The cardiac CTA and coronary calcium score
interpretation by the cardiologist is attached.
FINDINGS: Aortic atherosclerosis. Within the visualized portions of the thorax
there are no suspicious appearing pulmonary nodules or masses, there
is no acute consolidative airspace disease, no pleural effusions, no
pneumothorax and no lymphadenopathy. Visualized portions of the
upper abdomen are unremarkable. There are no aggressive appearing
lytic or blastic lesions noted in the visualized portions of the
skeleton.
IMPRESSION: 1.  Aortic Atherosclerosis (SIURI-YC2.2).

## 2019-11-14 ENCOUNTER — Telehealth: Payer: Self-pay | Admitting: Gastroenterology

## 2019-11-14 ENCOUNTER — Other Ambulatory Visit: Payer: Self-pay

## 2019-11-14 ENCOUNTER — Encounter (INDEPENDENT_AMBULATORY_CARE_PROVIDER_SITE_OTHER): Payer: Medicare Other | Admitting: Family Medicine

## 2019-11-14 DIAGNOSIS — Z8619 Personal history of other infectious and parasitic diseases: Secondary | ICD-10-CM

## 2019-11-14 NOTE — Telephone Encounter (Signed)
The pt's daughter has been advised to have the pt stop protonix for 1 week and then complete stool test.  The pt has been advised of the information and verbalized understanding.

## 2019-11-15 DIAGNOSIS — M170 Bilateral primary osteoarthritis of knee: Secondary | ICD-10-CM | POA: Diagnosis not present

## 2019-11-15 DIAGNOSIS — D649 Anemia, unspecified: Secondary | ICD-10-CM | POA: Diagnosis not present

## 2019-11-15 DIAGNOSIS — Z9181 History of falling: Secondary | ICD-10-CM | POA: Diagnosis not present

## 2019-11-15 DIAGNOSIS — J449 Chronic obstructive pulmonary disease, unspecified: Secondary | ICD-10-CM | POA: Diagnosis not present

## 2019-11-15 DIAGNOSIS — E785 Hyperlipidemia, unspecified: Secondary | ICD-10-CM | POA: Diagnosis not present

## 2019-11-15 DIAGNOSIS — E119 Type 2 diabetes mellitus without complications: Secondary | ICD-10-CM | POA: Diagnosis not present

## 2019-11-15 DIAGNOSIS — Z87891 Personal history of nicotine dependence: Secondary | ICD-10-CM | POA: Diagnosis not present

## 2019-11-15 DIAGNOSIS — Z95810 Presence of automatic (implantable) cardiac defibrillator: Secondary | ICD-10-CM | POA: Diagnosis not present

## 2019-11-15 DIAGNOSIS — Z7982 Long term (current) use of aspirin: Secondary | ICD-10-CM | POA: Diagnosis not present

## 2019-11-15 DIAGNOSIS — H9190 Unspecified hearing loss, unspecified ear: Secondary | ICD-10-CM | POA: Diagnosis not present

## 2019-11-15 DIAGNOSIS — I509 Heart failure, unspecified: Secondary | ICD-10-CM | POA: Diagnosis not present

## 2019-11-15 DIAGNOSIS — Z9981 Dependence on supplemental oxygen: Secondary | ICD-10-CM | POA: Diagnosis not present

## 2019-11-15 DIAGNOSIS — Z794 Long term (current) use of insulin: Secondary | ICD-10-CM | POA: Diagnosis not present

## 2019-11-16 ENCOUNTER — Telehealth: Payer: Self-pay | Admitting: Family Medicine

## 2019-11-16 DIAGNOSIS — Z79899 Other long term (current) drug therapy: Secondary | ICD-10-CM

## 2019-11-16 DIAGNOSIS — E119 Type 2 diabetes mellitus without complications: Secondary | ICD-10-CM

## 2019-11-16 NOTE — Telephone Encounter (Signed)
Patient needs to have virtual appointment.  I'm pretty sure Dr. Jenetta Downer will want labs on her.  Can you check and see what labs she will want, order them and have front office call [name] (patients daughter) to get the lab appointment scheduled.

## 2019-11-16 NOTE — Telephone Encounter (Signed)
Please advise what labs you would like done on patient. AS, CMA

## 2019-11-16 NOTE — Telephone Encounter (Signed)
[name], also, pt was due for a chronic f/up OV in early January 2021 and she needs telehealth OV to review labs/chronic conditions to ensure current meds are still appropriate.     -->Additionally, per med records, they were told last two RF's of metformin that they will need OV for further RF's.

## 2019-11-16 NOTE — Telephone Encounter (Signed)
If I misunderstood prior conversations and now the family has decided to continue care with our clinic, then please place orders.     The only lab I rec now is a magnesium level - since none has been obtained since 2019.  Thus dx code would be "high risk med use" for obtaining this lab.   And an A1c.   It has been too low for her age and for her current medical condition for some time now.  It has been less than 6.5 for over a year now and last check was 6.2 just 4 mo ago.)    We will discontinue her metformin if A1c remains at 7.5 or below.

## 2019-11-17 NOTE — Telephone Encounter (Signed)
Future labs ordered. AS, CMA 

## 2019-11-18 ENCOUNTER — Other Ambulatory Visit: Payer: Medicare Other

## 2019-11-18 DIAGNOSIS — J449 Chronic obstructive pulmonary disease, unspecified: Secondary | ICD-10-CM | POA: Diagnosis not present

## 2019-11-18 DIAGNOSIS — M170 Bilateral primary osteoarthritis of knee: Secondary | ICD-10-CM | POA: Diagnosis not present

## 2019-11-18 DIAGNOSIS — I509 Heart failure, unspecified: Secondary | ICD-10-CM | POA: Diagnosis not present

## 2019-11-18 DIAGNOSIS — D649 Anemia, unspecified: Secondary | ICD-10-CM | POA: Diagnosis not present

## 2019-11-18 DIAGNOSIS — E119 Type 2 diabetes mellitus without complications: Secondary | ICD-10-CM | POA: Diagnosis not present

## 2019-11-18 DIAGNOSIS — H9190 Unspecified hearing loss, unspecified ear: Secondary | ICD-10-CM | POA: Diagnosis not present

## 2019-11-21 NOTE — Telephone Encounter (Signed)
Okay thank you for notifying me that she did not show.  Appreciated

## 2019-11-21 NOTE — Telephone Encounter (Signed)
Per the message below per Nash Dimmer would assume this was correct. Patient did not show for lab draw last week. AS, CMA

## 2019-11-22 DIAGNOSIS — J449 Chronic obstructive pulmonary disease, unspecified: Secondary | ICD-10-CM | POA: Diagnosis not present

## 2019-11-22 DIAGNOSIS — D649 Anemia, unspecified: Secondary | ICD-10-CM | POA: Diagnosis not present

## 2019-11-22 DIAGNOSIS — E119 Type 2 diabetes mellitus without complications: Secondary | ICD-10-CM | POA: Diagnosis not present

## 2019-11-22 DIAGNOSIS — M170 Bilateral primary osteoarthritis of knee: Secondary | ICD-10-CM | POA: Diagnosis not present

## 2019-11-22 DIAGNOSIS — H9190 Unspecified hearing loss, unspecified ear: Secondary | ICD-10-CM | POA: Diagnosis not present

## 2019-11-22 DIAGNOSIS — I509 Heart failure, unspecified: Secondary | ICD-10-CM | POA: Diagnosis not present

## 2019-11-23 ENCOUNTER — Other Ambulatory Visit: Payer: Medicare Other

## 2019-11-23 DIAGNOSIS — Z8619 Personal history of other infectious and parasitic diseases: Secondary | ICD-10-CM

## 2019-11-24 DIAGNOSIS — E119 Type 2 diabetes mellitus without complications: Secondary | ICD-10-CM | POA: Diagnosis not present

## 2019-11-24 DIAGNOSIS — H9190 Unspecified hearing loss, unspecified ear: Secondary | ICD-10-CM | POA: Diagnosis not present

## 2019-11-24 DIAGNOSIS — I509 Heart failure, unspecified: Secondary | ICD-10-CM | POA: Diagnosis not present

## 2019-11-24 DIAGNOSIS — M170 Bilateral primary osteoarthritis of knee: Secondary | ICD-10-CM | POA: Diagnosis not present

## 2019-11-24 DIAGNOSIS — J449 Chronic obstructive pulmonary disease, unspecified: Secondary | ICD-10-CM | POA: Diagnosis not present

## 2019-11-24 DIAGNOSIS — D649 Anemia, unspecified: Secondary | ICD-10-CM | POA: Diagnosis not present

## 2019-11-24 LAB — HELICOBACTER PYLORI  SPECIAL ANTIGEN
MICRO NUMBER:: 10287567
SPECIMEN QUALITY: ADEQUATE

## 2019-11-29 DIAGNOSIS — H9190 Unspecified hearing loss, unspecified ear: Secondary | ICD-10-CM | POA: Diagnosis not present

## 2019-11-29 DIAGNOSIS — M170 Bilateral primary osteoarthritis of knee: Secondary | ICD-10-CM | POA: Diagnosis not present

## 2019-11-29 DIAGNOSIS — D649 Anemia, unspecified: Secondary | ICD-10-CM | POA: Diagnosis not present

## 2019-11-29 DIAGNOSIS — J449 Chronic obstructive pulmonary disease, unspecified: Secondary | ICD-10-CM | POA: Diagnosis not present

## 2019-11-29 DIAGNOSIS — E119 Type 2 diabetes mellitus without complications: Secondary | ICD-10-CM | POA: Diagnosis not present

## 2019-11-29 DIAGNOSIS — I509 Heart failure, unspecified: Secondary | ICD-10-CM | POA: Diagnosis not present

## 2019-12-01 DIAGNOSIS — I509 Heart failure, unspecified: Secondary | ICD-10-CM | POA: Diagnosis not present

## 2019-12-01 DIAGNOSIS — H9190 Unspecified hearing loss, unspecified ear: Secondary | ICD-10-CM | POA: Diagnosis not present

## 2019-12-01 DIAGNOSIS — E119 Type 2 diabetes mellitus without complications: Secondary | ICD-10-CM | POA: Diagnosis not present

## 2019-12-01 DIAGNOSIS — M170 Bilateral primary osteoarthritis of knee: Secondary | ICD-10-CM | POA: Diagnosis not present

## 2019-12-01 DIAGNOSIS — J449 Chronic obstructive pulmonary disease, unspecified: Secondary | ICD-10-CM | POA: Diagnosis not present

## 2019-12-01 DIAGNOSIS — D649 Anemia, unspecified: Secondary | ICD-10-CM | POA: Diagnosis not present

## 2019-12-06 DIAGNOSIS — H9190 Unspecified hearing loss, unspecified ear: Secondary | ICD-10-CM | POA: Diagnosis not present

## 2019-12-06 DIAGNOSIS — J449 Chronic obstructive pulmonary disease, unspecified: Secondary | ICD-10-CM | POA: Diagnosis not present

## 2019-12-06 DIAGNOSIS — I509 Heart failure, unspecified: Secondary | ICD-10-CM | POA: Diagnosis not present

## 2019-12-06 DIAGNOSIS — D649 Anemia, unspecified: Secondary | ICD-10-CM | POA: Diagnosis not present

## 2019-12-06 DIAGNOSIS — M170 Bilateral primary osteoarthritis of knee: Secondary | ICD-10-CM | POA: Diagnosis not present

## 2019-12-06 DIAGNOSIS — E119 Type 2 diabetes mellitus without complications: Secondary | ICD-10-CM | POA: Diagnosis not present

## 2019-12-07 ENCOUNTER — Ambulatory Visit (INDEPENDENT_AMBULATORY_CARE_PROVIDER_SITE_OTHER): Payer: Medicare Other | Admitting: *Deleted

## 2019-12-07 DIAGNOSIS — I5042 Chronic combined systolic (congestive) and diastolic (congestive) heart failure: Secondary | ICD-10-CM

## 2019-12-07 LAB — CUP PACEART REMOTE DEVICE CHECK
Battery Remaining Longevity: 120 mo
Battery Remaining Percentage: 95.5 %
Battery Voltage: 2.99 V
Brady Statistic AP VP Percent: 16 %
Brady Statistic AP VS Percent: 1 %
Brady Statistic AS VP Percent: 84 %
Brady Statistic AS VS Percent: 1 %
Brady Statistic RA Percent Paced: 15 %
Brady Statistic RV Percent Paced: 99 %
Date Time Interrogation Session: 20210407020014
Implantable Lead Implant Date: 20090401
Implantable Lead Implant Date: 20090401
Implantable Lead Location: 753859
Implantable Lead Location: 753860
Implantable Pulse Generator Implant Date: 20190925
Lead Channel Impedance Value: 430 Ohm
Lead Channel Impedance Value: 450 Ohm
Lead Channel Pacing Threshold Amplitude: 0.5 V
Lead Channel Pacing Threshold Amplitude: 0.875 V
Lead Channel Pacing Threshold Pulse Width: 0.5 ms
Lead Channel Pacing Threshold Pulse Width: 0.5 ms
Lead Channel Sensing Intrinsic Amplitude: 2.7 mV
Lead Channel Sensing Intrinsic Amplitude: 8.5 mV
Lead Channel Setting Pacing Amplitude: 1.125
Lead Channel Setting Pacing Amplitude: 2 V
Lead Channel Setting Pacing Pulse Width: 0.5 ms
Lead Channel Setting Sensing Sensitivity: 4 mV
Pulse Gen Model: 2272
Pulse Gen Serial Number: 9064942

## 2019-12-07 NOTE — Progress Notes (Signed)
PPM Remote  

## 2019-12-08 ENCOUNTER — Other Ambulatory Visit: Payer: Self-pay | Admitting: Adult Health

## 2019-12-08 DIAGNOSIS — E119 Type 2 diabetes mellitus without complications: Secondary | ICD-10-CM | POA: Diagnosis not present

## 2019-12-08 DIAGNOSIS — J449 Chronic obstructive pulmonary disease, unspecified: Secondary | ICD-10-CM | POA: Diagnosis not present

## 2019-12-08 DIAGNOSIS — H9190 Unspecified hearing loss, unspecified ear: Secondary | ICD-10-CM | POA: Diagnosis not present

## 2019-12-08 DIAGNOSIS — I509 Heart failure, unspecified: Secondary | ICD-10-CM | POA: Diagnosis not present

## 2019-12-08 DIAGNOSIS — M170 Bilateral primary osteoarthritis of knee: Secondary | ICD-10-CM | POA: Diagnosis not present

## 2019-12-08 DIAGNOSIS — M1712 Unilateral primary osteoarthritis, left knee: Secondary | ICD-10-CM | POA: Diagnosis not present

## 2019-12-08 DIAGNOSIS — D649 Anemia, unspecified: Secondary | ICD-10-CM | POA: Diagnosis not present

## 2019-12-12 DIAGNOSIS — E119 Type 2 diabetes mellitus without complications: Secondary | ICD-10-CM | POA: Diagnosis not present

## 2019-12-12 DIAGNOSIS — D649 Anemia, unspecified: Secondary | ICD-10-CM | POA: Diagnosis not present

## 2019-12-12 DIAGNOSIS — M170 Bilateral primary osteoarthritis of knee: Secondary | ICD-10-CM | POA: Diagnosis not present

## 2019-12-12 DIAGNOSIS — J449 Chronic obstructive pulmonary disease, unspecified: Secondary | ICD-10-CM | POA: Diagnosis not present

## 2019-12-12 DIAGNOSIS — H9190 Unspecified hearing loss, unspecified ear: Secondary | ICD-10-CM | POA: Diagnosis not present

## 2019-12-12 DIAGNOSIS — I509 Heart failure, unspecified: Secondary | ICD-10-CM | POA: Diagnosis not present

## 2019-12-15 DIAGNOSIS — D649 Anemia, unspecified: Secondary | ICD-10-CM | POA: Diagnosis not present

## 2019-12-15 DIAGNOSIS — Z794 Long term (current) use of insulin: Secondary | ICD-10-CM | POA: Diagnosis not present

## 2019-12-15 DIAGNOSIS — E119 Type 2 diabetes mellitus without complications: Secondary | ICD-10-CM | POA: Diagnosis not present

## 2019-12-15 DIAGNOSIS — I509 Heart failure, unspecified: Secondary | ICD-10-CM | POA: Diagnosis not present

## 2019-12-15 DIAGNOSIS — Z87891 Personal history of nicotine dependence: Secondary | ICD-10-CM | POA: Diagnosis not present

## 2019-12-15 DIAGNOSIS — E785 Hyperlipidemia, unspecified: Secondary | ICD-10-CM | POA: Diagnosis not present

## 2019-12-15 DIAGNOSIS — J449 Chronic obstructive pulmonary disease, unspecified: Secondary | ICD-10-CM | POA: Diagnosis not present

## 2019-12-15 DIAGNOSIS — Z9981 Dependence on supplemental oxygen: Secondary | ICD-10-CM | POA: Diagnosis not present

## 2019-12-15 DIAGNOSIS — H9190 Unspecified hearing loss, unspecified ear: Secondary | ICD-10-CM | POA: Diagnosis not present

## 2019-12-15 DIAGNOSIS — M170 Bilateral primary osteoarthritis of knee: Secondary | ICD-10-CM | POA: Diagnosis not present

## 2019-12-15 DIAGNOSIS — Z9181 History of falling: Secondary | ICD-10-CM | POA: Diagnosis not present

## 2019-12-15 DIAGNOSIS — Z7982 Long term (current) use of aspirin: Secondary | ICD-10-CM | POA: Diagnosis not present

## 2019-12-15 DIAGNOSIS — Z95810 Presence of automatic (implantable) cardiac defibrillator: Secondary | ICD-10-CM | POA: Diagnosis not present

## 2019-12-20 DIAGNOSIS — H9190 Unspecified hearing loss, unspecified ear: Secondary | ICD-10-CM | POA: Diagnosis not present

## 2019-12-20 DIAGNOSIS — M170 Bilateral primary osteoarthritis of knee: Secondary | ICD-10-CM | POA: Diagnosis not present

## 2019-12-20 DIAGNOSIS — E119 Type 2 diabetes mellitus without complications: Secondary | ICD-10-CM | POA: Diagnosis not present

## 2019-12-20 DIAGNOSIS — J449 Chronic obstructive pulmonary disease, unspecified: Secondary | ICD-10-CM | POA: Diagnosis not present

## 2019-12-20 DIAGNOSIS — I509 Heart failure, unspecified: Secondary | ICD-10-CM | POA: Diagnosis not present

## 2019-12-20 DIAGNOSIS — D649 Anemia, unspecified: Secondary | ICD-10-CM | POA: Diagnosis not present

## 2019-12-27 DIAGNOSIS — H9190 Unspecified hearing loss, unspecified ear: Secondary | ICD-10-CM | POA: Diagnosis not present

## 2019-12-27 DIAGNOSIS — M170 Bilateral primary osteoarthritis of knee: Secondary | ICD-10-CM | POA: Diagnosis not present

## 2019-12-27 DIAGNOSIS — E119 Type 2 diabetes mellitus without complications: Secondary | ICD-10-CM | POA: Diagnosis not present

## 2019-12-27 DIAGNOSIS — I509 Heart failure, unspecified: Secondary | ICD-10-CM | POA: Diagnosis not present

## 2019-12-27 DIAGNOSIS — J449 Chronic obstructive pulmonary disease, unspecified: Secondary | ICD-10-CM | POA: Diagnosis not present

## 2019-12-27 DIAGNOSIS — D649 Anemia, unspecified: Secondary | ICD-10-CM | POA: Diagnosis not present

## 2019-12-29 ENCOUNTER — Other Ambulatory Visit: Payer: Self-pay | Admitting: Adult Health

## 2019-12-29 ENCOUNTER — Telehealth: Payer: Self-pay

## 2019-12-29 NOTE — Telephone Encounter (Signed)
Please call pt to schedule appt.  No further refills until pt is seen.  T. Asuna Peth, CMA  

## 2020-01-02 ENCOUNTER — Other Ambulatory Visit: Payer: Self-pay

## 2020-01-02 MED ORDER — METFORMIN HCL 500 MG PO TABS: ORAL_TABLET | ORAL | 0 refills | Status: DC

## 2020-01-03 ENCOUNTER — Telehealth: Payer: Self-pay | Admitting: Physician Assistant

## 2020-01-03 DIAGNOSIS — D649 Anemia, unspecified: Secondary | ICD-10-CM | POA: Diagnosis not present

## 2020-01-03 DIAGNOSIS — H9190 Unspecified hearing loss, unspecified ear: Secondary | ICD-10-CM | POA: Diagnosis not present

## 2020-01-03 DIAGNOSIS — I509 Heart failure, unspecified: Secondary | ICD-10-CM | POA: Diagnosis not present

## 2020-01-03 DIAGNOSIS — M170 Bilateral primary osteoarthritis of knee: Secondary | ICD-10-CM | POA: Diagnosis not present

## 2020-01-03 DIAGNOSIS — E119 Type 2 diabetes mellitus without complications: Secondary | ICD-10-CM | POA: Diagnosis not present

## 2020-01-03 DIAGNOSIS — J449 Chronic obstructive pulmonary disease, unspecified: Secondary | ICD-10-CM | POA: Diagnosis not present

## 2020-01-03 NOTE — Telephone Encounter (Signed)
Does pt's daughter have any particular concerns she wants to discuss during the visit? Certain exceptions can be made if appropriate.   Thank you, Herb Grays

## 2020-01-03 NOTE — Telephone Encounter (Signed)
Pt's daughter/Tina Arnoldo Morale called with to issues :  Pt needs a refill on Metformin & she also wanted to make sure that if scheduled nest APPT she would be allowed in room w/Mom -----Advised daughter Metformin Rx refilled for only 15 pills because OV/Telehealth required & Provider would have to approve her request to be in room during OV w/parent  --Forwarding message to med asst that Appt scheduled for 5/25 & pt's daugthers's request TBD.  --glh

## 2020-01-03 NOTE — Telephone Encounter (Signed)
#  15 day supply of meds sent to pharmacy 01/02/20. No further refills until patient seen per refill protocol.   Patient has scheduled apt 01/24/20 at 4pm. Patient daughter requesting to come in with patient.  Please advise. AS, CMA

## 2020-01-03 NOTE — Telephone Encounter (Signed)
Attempted to call [name] back. No answer. Unable to leave voicemail due to voicemail box being full. AS, CMA

## 2020-01-04 NOTE — Telephone Encounter (Signed)
Spoke with patients daughter who states her concerns are the following:  Her mother is 81 yrs old and has a natural decline in hearing-especially since masks.  Patient will have lack of understanding and there will be a lack of communication.  Patient is unable to walk or sit down unassisted.   [name] states that the apt without her present will be pointless and worthless and that if she is denied the ability to come back with her mother then they will not keep the apt and the patient will not get the medications or care that is needed.   [name] states that she has always been allowed to come back with mother with the exception of last OV where patient was to see Dr. Raliegh Scarlet and was denied the right to come back with patient and that patient was not seen. States she has spoken to [place] and that this should not be an issue.   Spoke with Herb Grays who states that patient daughter is ok to come back with patient. AS, CMA

## 2020-01-04 NOTE — Telephone Encounter (Signed)
Patient daughter is aware of the below and verbalized understanding. AS, CMA

## 2020-01-09 DIAGNOSIS — H9190 Unspecified hearing loss, unspecified ear: Secondary | ICD-10-CM | POA: Diagnosis not present

## 2020-01-09 DIAGNOSIS — E119 Type 2 diabetes mellitus without complications: Secondary | ICD-10-CM | POA: Diagnosis not present

## 2020-01-09 DIAGNOSIS — D649 Anemia, unspecified: Secondary | ICD-10-CM | POA: Diagnosis not present

## 2020-01-09 DIAGNOSIS — M170 Bilateral primary osteoarthritis of knee: Secondary | ICD-10-CM | POA: Diagnosis not present

## 2020-01-09 DIAGNOSIS — I509 Heart failure, unspecified: Secondary | ICD-10-CM | POA: Diagnosis not present

## 2020-01-09 DIAGNOSIS — J449 Chronic obstructive pulmonary disease, unspecified: Secondary | ICD-10-CM | POA: Diagnosis not present

## 2020-01-24 ENCOUNTER — Ambulatory Visit (INDEPENDENT_AMBULATORY_CARE_PROVIDER_SITE_OTHER): Payer: Medicare Other | Admitting: Physician Assistant

## 2020-01-24 ENCOUNTER — Other Ambulatory Visit: Payer: Self-pay

## 2020-01-24 VITALS — BP 119/63 | HR 64 | Temp 98.1°F | Ht 64.0 in | Wt 151.0 lb

## 2020-01-24 DIAGNOSIS — E785 Hyperlipidemia, unspecified: Secondary | ICD-10-CM | POA: Diagnosis not present

## 2020-01-24 DIAGNOSIS — H9190 Unspecified hearing loss, unspecified ear: Secondary | ICD-10-CM | POA: Diagnosis not present

## 2020-01-24 DIAGNOSIS — Z0000 Encounter for general adult medical examination without abnormal findings: Secondary | ICD-10-CM

## 2020-01-24 DIAGNOSIS — I10 Essential (primary) hypertension: Secondary | ICD-10-CM | POA: Diagnosis not present

## 2020-01-24 DIAGNOSIS — E119 Type 2 diabetes mellitus without complications: Secondary | ICD-10-CM | POA: Diagnosis not present

## 2020-01-24 LAB — POCT GLYCOSYLATED HEMOGLOBIN (HGB A1C): Hemoglobin A1C: 5.8 % — AB (ref 4.0–5.6)

## 2020-01-24 LAB — POCT UA - MICROALBUMIN
Albumin/Creatinine Ratio, Urine, POC: <30
Creatinine, POC: 50 mg/dL
Microalbumin Ur, POC: 10 mg/L

## 2020-01-24 NOTE — Progress Notes (Signed)
Established Patient Office Visit  Subjective:  [name], female    DOB: 08-23-1941  Age: 79 y.o. MRN: [id number]  CC:  Chief Complaint  Patient presents with  . Diabetes  . Hyperlipidemia  . Hypertension    HPI [name] presents for chronic follow-up on T2DM, hypertension, and hyperlipidemia. Pt is accompanied by her daughter.    Diabetes: Pt denies increased urination or thirst. Pt reports medication compliance. No hypoglycemic events. Checking glucose at home. FBS 110-130, rarely 150. Pt has made dietary changes by reducing carbohydrates and has lost weight.   HTN: Pt denies chest pain, palpitations, dizziness or lower extremity swelling. Taking medication as directed without side effects. Checks BP at home. Pt follows a low salt diet.  HLD: Pt taking medication as directed without issues. Denies side effects including myalgias and RUQ pain. She has reduced red meats and eating more lean meats- chicken, [place] and fish.   Hearing loss: Pt has been staying with her daughter x past 6 months, and she has noticed a decline in patient's hearing.    Past Medical History:  Diagnosis Date  . Arthritis    "hands, spine" (12/07/2014)  . CHB (complete heart block) ([place]) 09/19/2013  . CHF (congestive heart failure) ([place]) dx'd 12/06/2014  . Chronic pain    "since neck OR in 2007"  . Coronary artery disease    normal coronaries by 09/10/00 cath  . Diabetes mellitus    hga1c 7.1 no meds  . Dysrhythmia   . HTN (hypertension) 09/19/2013  . Hypercholesterolemia   . Hyperlipidemia 09/19/2013  . Hypertension   . Myocardial infarction ([place]) dx'd 2009  . Neuropathy of both feet   . Pacemaker   . 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 Shortness of breath    exertional  . Type II diabetes mellitus ([place])    "had it years ago; lost weight after neck OR in 2007; glucose went way down; recently dx'd as returned recently" (12/07/2014)  . Urinary  incontinence    "since 2007's OR"  . Venous insufficiency (chronic) (peripheral)     Past Surgical History:  Procedure Laterality Date  . ANTERIOR CERVICAL DECOMP/DISCECTOMY FUSION  2007  . BACK SURGERY    . BIOPSY  07/29/2019   Procedure: BIOPSY;  Surgeon: Ladene Artist, MD;  Location: [facility] ENDOSCOPY;  Service: Endoscopy;;  . CARDIAC CATHETERIZATION  09/10/2000   normal Coronary arteries, EF60%,   . CARDIAC PACEMAKER PLACEMENT  12/01/2007   [employer]ude Zephyr Elta Gu[place] [id number], dual chamber  . CARDIOVASCULAR STRESS TEST  08/28/2000   Cardiolite perfusion study, EF58%, low risk study,   . DILATION AND CURETTAGE OF UTERUS  1960's  . ESOPHAGOGASTRODUODENOSCOPY N/A 07/29/2019   Procedure: ESOPHAGOGASTRODUODENOSCOPY (EGD);  Surgeon: Ladene Artist, MD;  Location: [facility] ENDOSCOPY;  Service: Endoscopy;  Laterality: N/A;  . HOT HEMOSTASIS N/A 07/29/2019   Procedure: HOT HEMOSTASIS (ARGON PLASMA COAGULATION/BICAP);  Surgeon: Ladene Artist, MD;  Location: [facility] ENDOSCOPY;  Service: Endoscopy;  Laterality: N/A;  . KNEE ARTHROSCOPY Right   . LAPAROSCOPIC CHOLECYSTECTOMY  1995  . LEFT AND RIGHT HEART CATHETERIZATION WITH CORONARY ANGIOGRAM N/A 12/14/2014   Procedure: LEFT AND RIGHT HEART CATHETERIZATION WITH CORONARY ANGIOGRAM;  Surgeon: Peter M Martinique, MD;  Location: [facility] CATH LAB;  Service: Cardiovascular;  Laterality: N/A;  . Lower Ext. Dopplers  06/12/2011   no evidence of thrombus, all vessels normal in size, no evidence of insuffiency  .  LUMBAR LAMINECTOMY/DECOMPRESSION MICRODISCECTOMY  03/02/2012   Procedure: LUMBAR LAMINECTOMY/DECOMPRESSION MICRODISCECTOMY 2 LEVELS;  Surgeon: Floyce Stakes, MD;  Location: [place] NEURO ORS;  Service: Neurosurgery;  Laterality: Bilateral;  Lumbar three, lumbar four, lumbar five Laminectomy/Foraminotomy  . MOLE REMOVAL     left side of face near her eye  . NM MYOCAR PERF WALL MOTION  10/24/2008   protocol:Lexiscan, EF63%, normal study, scan negative for ischemia  . PPM  GENERATOR CHANGEOUT N/A 05/26/2018   Procedure: PPM GENERATOR CHANGEOUT;  Surgeon: Sanda Klein, MD;  Location: [facility] CV LAB;  Service: Cardiovascular;  Laterality: N/A;  . TRANSTHORACIC ECHOCARDIOGRAM  11/30/2007   normal echo, mean transaortic valve gradient 64mmHg    Family History  Problem Relation Age of Onset  . Diabetes Father   . Heart disease Father        before age 89  . Hyperlipidemia Father   . Hypertension Father   . Heart attack Father   . Congestive Heart Failure Father   . Diabetes Mother   . Heart disease Mother   . Hyperlipidemia Mother   . Hypertension Mother   . Congestive Heart Failure Mother   . Diabetes Brother   . Congestive Heart Failure Brother   . Colon cancer Neg Hx   . Esophageal cancer Neg Hx   . Rectal cancer Neg Hx     Social History   Socioeconomic History  . Marital status: Widowed    Spouse name: Not on file  . Number of children: 2  . Years of education: Not on file  . Highest education level: Not on file  Occupational History  . Occupation: Self employed- Academic librarian business -retired  Tobacco Use  . Smoking status: Former Smoker    Packs/day: 0.50    Years: 32.00    Pack years: 16.00    Types: Cigarettes    Start date: 1963    Quit date: 03/02/1994    Years since quitting: 25.9  . Smokeless tobacco: Never Used  Substance and Sexual Activity  . Alcohol use: No  . Drug use: No  . Sexual activity: Not Currently    Birth control/protection: None  Other Topics Concern  . Not on file  Social History Narrative  . Not on file   Social Determinants of Health   Financial Resource Strain:   . Difficulty of Paying Living Expenses:   Food Insecurity:   . Worried About Charity fundraiser in the Last Year:   . Arboriculturist in the Last Year:   Transportation Needs:   . Film/video editor (Medical):   Marland Kitchen Lack of Transportation (Non-Medical):   Physical Activity:   . Days of Exercise per Week:   . Minutes of Exercise per  Session:   Stress:   . Feeling of Stress :   Social Connections:   . Frequency of Communication with Friends and Family:   . Frequency of Social Gatherings with Friends and Family:   . Attends Religious Services:   . Active Member of Clubs or Organizations:   . Attends Archivist Meetings:   Marland Kitchen Marital Status:   Intimate Partner Violence:   . Fear of Current or Ex-Partner:   . Emotionally Abused:   Marland Kitchen Physically Abused:   . Sexually Abused:     Outpatient Medications Prior to Visit  Medication Sig Dispense Refill  . acetaminophen (TYLENOL) 500 MG tablet Take 500 mg by mouth every 6 (six) hours as needed for mild pain.     Marland Kitchen  aspirin EC 81 MG tablet Take 81 mg by mouth daily.    . furosemide (LASIX) 40 MG tablet Take 1 tablet (40 mg total) by mouth daily. May take an extra tablet for weight gain of 2 lb in a day and 5 lbs in a weeks. 95 tablet 3  . KLOR-CON M20 20 MEQ tablet Take 20 mEq by mouth daily.    Marland Kitchen losartan (COZAAR) 50 MG tablet Take 50 mg by mouth daily.    . metoprolol succinate (TOPROL-XL) 25 MG 24 hr tablet TAKE 0.5 TABLETS (12.5 MG TOTAL) BY MOUTH DAILY. TAKE WITH OR IMMEDIATELY FOLLOWING A MEAL. 45 tablet 2  . Nutritional Supplements (JUICE PLUS FIBRE PO) Take 3 tablets by mouth at bedtime.    . OXYGEN 2lpm with sleep and exertion [employer]    . pantoprazole (PROTONIX) 40 MG tablet Take 1 tablet (40 mg total) by mouth daily. 90 tablet 3  . Respiratory Therapy Supplies (FLUTTER) DEVI Use flutter device three times a day as instructed 1 each 0  . metFORMIN (GLUCOPHAGE) 500 MG tablet TAKE 1/2 TABLET NIGHTLY WITH DINNER 15 tablet 0  . pravastatin (PRAVACHOL) 40 MG tablet TAKE 1 TABLET BY MOUTH EVERY DAY 30 tablet 0   No facility-administered medications prior to visit.    No Known Allergies  ROS Review of Systems Review of Systems:  A fourteen system review of systems was performed and found to be positive as per HPI.    Objective:    Physical Exam  BP  119/63   Pulse 64   Temp 98.1 F (36.7 C) (Oral)   Ht 5\' 4"  (1.626 m)   Wt 151 lb (68.5 kg)   SpO2 92% Comment: on RA  BMI 25.92 kg/m  Wt Readings from Last 3 Encounters:  01/24/20 151 lb (68.5 kg)  10/06/19 157 lb (71.2 kg)  09/09/19 159 lb (72.1 kg)     Health Maintenance Due  Topic Date Due  . OPHTHALMOLOGY EXAM  Never done  . COVID-19 Vaccine (1) Never done  . TETANUS/TDAP  Never done  . DEXA SCAN  Never done  . PNA vac Low Risk Adult (2 of 2 - PCV13) 06/02/2007    There are no preventive care reminders to display for this patient.  Lab Results  Component Value Date   TSH 2.050 07/18/2019   Lab Results  Component Value Date   WBC 7.9 09/09/2019   HGB 11.7 (L) 09/09/2019   HCT 35.8 (L) 09/09/2019   MCV 90.1 09/09/2019   PLT 178.0 09/09/2019   Lab Results  Component Value Date   NA 136 08/08/2019   K 4.0 08/08/2019   CO2 27 08/08/2019   GLUCOSE 125 (H) 08/08/2019   BUN 24 08/08/2019   CREATININE 0.77 08/08/2019   BILITOT 0.5 07/28/2019   ALKPHOS 36 (L) 07/28/2019   AST 19 07/28/2019   ALT 14 07/28/2019   PROT 5.7 (L) 07/28/2019   ALBUMIN 3.5 07/28/2019   CALCIUM 9.3 08/08/2019   ANIONGAP 9 08/01/2019   GFR 94.44 05/05/2019   Lab Results  Component Value Date   CHOL 160 07/18/2019   Lab Results  Component Value Date   HDL 63 07/18/2019   Lab Results  Component Value Date   LDLCALC 73 07/18/2019   Lab Results  Component Value Date   TRIG 137 07/18/2019   Lab Results  Component Value Date   CHOLHDL 2.5 07/18/2019   Lab Results  Component Value Date   HGBA1C 5.8 (A)  01/24/2020      Assessment & Plan:   Problem List Items Addressed This Visit      Cardiovascular and Mediastinum   Essential hypertension (Chronic)     Endocrine   Diabetes mellitus type 2, controlled, without complications ([place]) - Primary   Relevant Orders   POCT glycosylated hemoglobin (Hb A1C) (Completed)   POCT UA - Microalbumin (Completed)     Other    Hyperlipidemia   Healthcare maintenance    Other Visit Diagnoses    Hearing loss, unspecified hearing loss type, unspecified laterality       Relevant Orders   Ambulatory referral to Audiology     Type 2 Diabetes Mellitus: - A1c today is 5.8, at goal. - Discussed with patient and daughter discontinuing Metformin given A1c has decreased and wnl's. A1c goal is 7-8 given patient's co-morbidities and age. Encourage to continue dietary changes low in carbohydrates/glucose to control diabetes with diet. Pt and daughter verbalized understanding and agree with treatment plan.  -Continue ambulatory glucose monitoring and notify clinic if FBS consistently <80 or >160. - Per chart review, she has lost 6 pounds since last recorded weight (02/21). - UA microalbumin normal - Will continue to monitor.  HLD: - Last lipid panel wnl's. - Continue Pravastatin. - Continue heart healthy diet and stay as active as possible.  - Plan to recheck lipid panel and hepatic function in 3 mons  HTN: - BP today is 119/63, at goal. - Continue Losartan and Metoprolol   - Continue ambulatory BP and pulse monitoring. - Continue DASH diet. - Encourage to stay as active as possible. - Plan to recheck CMP in 3 mons  Hearing loss: - Placed audiology referral for formal audiometry testing to evaluate for hearing loss.   Healthcare Maintenance: - Continue follow-up with various specialists: cardiology, gastroenterology, pulmonology, orthopedics - Continue Lasix and take as directed by cardiologist  - Stay well hydrated, at least 64 fl oz - Follow-up in 3 months for regular OV and FBW (lipid, cmp, cbc, a1c)  No orders of the defined types were placed in this encounter.   Follow-up: Return in about 3 months (around 04/25/2020) for DM, HTN, HLD, and FBW 1 wk prior.    Lorrene Reid, PA-C

## 2020-01-24 NOTE — Patient Instructions (Signed)

## 2020-01-26 ENCOUNTER — Other Ambulatory Visit: Payer: Self-pay

## 2020-01-26 MED ORDER — PRAVASTATIN SODIUM 40 MG PO TABS: 40.0000 mg | ORAL_TABLET | Freq: Every day | ORAL | 0 refills | Status: DC

## 2020-01-31 ENCOUNTER — Other Ambulatory Visit: Payer: Self-pay | Admitting: Physician Assistant

## 2020-02-14 ENCOUNTER — Other Ambulatory Visit: Payer: Self-pay

## 2020-02-14 ENCOUNTER — Ambulatory Visit: Payer: Medicare Other | Attending: Physician Assistant | Admitting: Audiologist

## 2020-02-14 DIAGNOSIS — H903 Sensorineural hearing loss, bilateral: Secondary | ICD-10-CM

## 2020-02-14 NOTE — Procedures (Signed)
[facility] [place], [place]  [id number] [phone]  AUDIOLOGICAL  EVALUATION  NAME: Tammy EASTHAM     DOB:   28-Feb-1941      MRN: [id number]                                                                                     DATE: 02/14/2020     REFERENT: Lorrene Reid, PA-C STATUS: Outpatient DIAGNOSIS: H90.3 Sensorineural Hearing Loss    History: [name] was seen for an audiological evaluation. [name] was accompanied to the appointment by her daughter.  [name] is receiving a hearing evaluation due to concerns for hearing loss, her family is concerned she is not hearing the phone ring or arlams . [name] has difficulty hearing people talk to her while walking away or from a distance. This difficulty began gradually. No pain or pressure reported in either ear.  No tinnitus present in either ears. [name] has no history of noise exposure.  Medical history positive for diabetes and heart disease which is a risk factor for hearing loss. No other relevant case history reported.    Evaluation:   Otoscopy showed a clear view of the tympanic membrane in the left ear and significant wax occlusion in the right ear  Tympanometry results were consistent with normal function of the middle ear, bilaterally, which shows sound is bypassing wax in right ear   Audiometric testing was completed using conventional audiometry with headphones transducer. Speech Reception Thresholds were consistent with pure tone averages. Word Recognition was excellent at elevated level. Pure tone thresholds show mild sloping to severe sensorineural hearing loss in both ears. Test results are consistent with high frequency hearing loss that creates difficulty hearing high pitched consonants and other sounds.   Results:  The test results were reviewed with [name] and her daughter. [name] is a candidate for hearing aids and a handout was given to her with local audiologists  who dispense hearing aids. Hearing aids must be worn regularly for maximum benefit. Good word recognition at loud level shows [name] is a good candidate for hearing aids. Occasional use of hearing aids will not lead to success. The hearing loss is not due to the wax. The hearing loss will significantly impact her hearing unless someone is face to face within five feet so she can read their lips. This hearing loss is likely due to age and health issues. There is no concern for ear disease.   Recommendations 1. Amplification is necessary for both ears. Hearing aids can be purchased from a variety of locations. See provided list for locations in the [place] area.  Debrox Earwax Removal Drops are a safe and inexpensive in-home solution for wax removal. Debrox Earwax Removal Kit includes a soft rubber bulb syringe to rinse your ear after using Debrox Earwax Removal Drops. Excessive earwax build-up can lead to ear discomfort and reduced hearing, which can affect your day-to-day life. The kit can be purchased over the counter at [place], [place], [facility], and most other pharmacies.  Amplified alarms and other loud or adapted products for hard of hearing people can be found online. They are readily available to  purchase by consumers.  Family and other communication partners need to always be face to face and within five feet when talking with [name].   How to use the Debrox Earwax Removal Drops Kit:  1. tilt head sideways. 2. place 5 to 10 drops into ear. 3. tip of applicator should not enter ear canal. 4. keep drops in ear for several minutes by keeping head tilted or placing cotton in the ear. 5. use twice daily for up to four days  6. gently flush ear with water, using soft rubber bulb syringe after final treatment (on 4th day)    Alfonse Alpers  Audiologist, Au.D., CCC-A 02/14/2020  3:00 PM  Cc: Lorrene Reid, PA-C

## 2020-02-25 ENCOUNTER — Other Ambulatory Visit: Payer: Self-pay | Admitting: Physician Assistant

## 2020-02-26 ENCOUNTER — Other Ambulatory Visit: Payer: Self-pay | Admitting: Physician Assistant

## 2020-02-27 ENCOUNTER — Other Ambulatory Visit: Payer: Self-pay | Admitting: Adult Health

## 2020-03-07 ENCOUNTER — Ambulatory Visit (INDEPENDENT_AMBULATORY_CARE_PROVIDER_SITE_OTHER): Payer: Medicare Other | Admitting: *Deleted

## 2020-03-07 DIAGNOSIS — I442 Atrioventricular block, complete: Secondary | ICD-10-CM | POA: Diagnosis not present

## 2020-03-07 LAB — CUP PACEART REMOTE DEVICE CHECK
Battery Remaining Longevity: 120 mo
Battery Remaining Percentage: 95.5 %
Battery Voltage: 2.99 V
Brady Statistic AP VP Percent: 22 %
Brady Statistic AP VS Percent: 1 %
Brady Statistic AS VP Percent: 77 %
Brady Statistic AS VS Percent: 1 %
Brady Statistic RA Percent Paced: 22 %
Brady Statistic RV Percent Paced: 99 %
Date Time Interrogation Session: 20210707020018
Implantable Lead Implant Date: 20090401
Implantable Lead Implant Date: 20090401
Implantable Lead Location: 753859
Implantable Lead Location: 753860
Implantable Pulse Generator Implant Date: 20190925
Lead Channel Impedance Value: 430 Ohm
Lead Channel Impedance Value: 440 Ohm
Lead Channel Pacing Threshold Amplitude: 0.5 V
Lead Channel Pacing Threshold Amplitude: 0.75 V
Lead Channel Pacing Threshold Pulse Width: 0.5 ms
Lead Channel Pacing Threshold Pulse Width: 0.5 ms
Lead Channel Sensing Intrinsic Amplitude: 2 mV
Lead Channel Sensing Intrinsic Amplitude: 8.5 mV
Lead Channel Setting Pacing Amplitude: 1 V
Lead Channel Setting Pacing Amplitude: 2 V
Lead Channel Setting Pacing Pulse Width: 0.5 ms
Lead Channel Setting Sensing Sensitivity: 4 mV
Pulse Gen Model: 2272
Pulse Gen Serial Number: 9064942

## 2020-03-09 NOTE — Progress Notes (Signed)
Remote pacemaker transmission.   

## 2020-03-11 NOTE — Progress Notes (Signed)
  Subjective:  [name], female    DOB: 15-Oct-1940,  MRN: [id number]  Chief Complaint  Patient presents with  . Foot Problem    Pt states left 3rd digit possible injury, no pain but pt does have neuropathy. Dark discoloration which has spread. Pt guardian states they noticed 6 days ago. No drainage.    79 y.o. female presents for wound care. Hx confirmed with patient.  Objective:  Physical Exam: Wound Location: left 3rd toe Wound Measurement: 0.3x0.2 Wound Base: Granular/Healthy Peri-wound: Calloused Exudate: None: wound tissue dry wound without warmth, erythema, signs of acute infection  Assessment:   1. Ulcer of toe, left, limited to breakdown of skin [facility])      Plan:  Patient was evaluated and treated and all questions answered.  Ulcer left 3rd toe -Lesion gently debrided, non-procedural -Dressed with abx ointment and band-aid -F/u in 1 month for recheck  No follow-ups on file.

## 2020-03-12 ENCOUNTER — Other Ambulatory Visit: Payer: Self-pay

## 2020-03-12 ENCOUNTER — Encounter: Payer: Self-pay | Admitting: Cardiovascular Disease

## 2020-03-12 ENCOUNTER — Ambulatory Visit (INDEPENDENT_AMBULATORY_CARE_PROVIDER_SITE_OTHER): Payer: Medicare Other | Admitting: Cardiovascular Disease

## 2020-03-12 VITALS — BP 114/64 | HR 64 | Ht 65.0 in | Wt 151.0 lb

## 2020-03-12 DIAGNOSIS — I471 Supraventricular tachycardia: Secondary | ICD-10-CM

## 2020-03-12 DIAGNOSIS — J449 Chronic obstructive pulmonary disease, unspecified: Secondary | ICD-10-CM

## 2020-03-12 DIAGNOSIS — I2510 Atherosclerotic heart disease of native coronary artery without angina pectoris: Secondary | ICD-10-CM

## 2020-03-12 DIAGNOSIS — D500 Iron deficiency anemia secondary to blood loss (chronic): Secondary | ICD-10-CM

## 2020-03-12 DIAGNOSIS — I2721 Secondary pulmonary arterial hypertension: Secondary | ICD-10-CM

## 2020-03-12 DIAGNOSIS — I5042 Chronic combined systolic (congestive) and diastolic (congestive) heart failure: Secondary | ICD-10-CM | POA: Diagnosis not present

## 2020-03-12 DIAGNOSIS — E119 Type 2 diabetes mellitus without complications: Secondary | ICD-10-CM | POA: Diagnosis not present

## 2020-03-12 DIAGNOSIS — Z950 Presence of cardiac pacemaker: Secondary | ICD-10-CM

## 2020-03-12 DIAGNOSIS — I442 Atrioventricular block, complete: Secondary | ICD-10-CM

## 2020-03-12 DIAGNOSIS — K274 Chronic or unspecified peptic ulcer, site unspecified, with hemorrhage: Secondary | ICD-10-CM

## 2020-03-12 NOTE — Patient Instructions (Signed)
Medication Instructions:  STOP the Metoprolol Succinate  *If you need a refill on your cardiac medications before your next appointment, please call your pharmacy*   Lab Work: None ordered If you have labs (blood work) drawn today and your tests are completely normal, you will receive your results only by: Marland Kitchen MyChart Message (if you have MyChart) OR . A paper copy in the mail If you have any lab test that is abnormal or we need to change your treatment, we will call you to review the results.   Testing/Procedures: None ordered   Follow-Up: At [facility], you and your health needs are our priority.  As part of our continuing mission to provide you with exceptional heart care, we have created designated Provider Care Teams.  These Care Teams include your primary Cardiologist (physician) and Advanced Practice Providers (APPs -  Physician Assistants and Nurse Practitioners) who all work together to provide you with the care you need, when you need it.  We recommend signing up for the patient portal called "MyChart".  Sign up information is provided on this After Visit Summary.  MyChart is used to connect with patients for Virtual Visits (Telemedicine).  Patients are able to view lab/test results, encounter notes, upcoming appointments, etc.  Non-urgent messages can be sent to your provider as well.   To learn more about what you can do with MyChart, go to [email or web address].    Your next appointment:   12 month(s)  The format for your next appointment:   In Person  Provider:   Sanda Klein, MD

## 2020-03-12 NOTE — Progress Notes (Signed)
[name], female   DOB: [birth year], 79 y.o.   MRN: [id number]    Cardiology Office Note    Date:  03/12/2020   ID:  Tammy, Vincent June 26, 1941, MRN [id number]  PCP:  Tammy Reid, PA-C  Cardiologist:   Tammy Klein, MD   Chief Complaint  Patient presents with  . Congestive Heart Failure  . Pacemaker Check    History of Present Illness:  [name] is a 79 y.o. female with complete heart block/pacemaker dependent, St. Jude pacemaker (implanted 2007/12/17, generator change 2017-12-16 St. Jude Assurity), chronic diastolic heart failure, hypertension, COPD with chronic respiratory insufficiency on home oxygen (quit smoking >25 years ago).   Her family has carefully limited her sodium intake and she has not required any adjustment in the furosemide dosage since her last appointment.  She does not have orthopnea PND but has chronic NYHA functional class II exertional dyspnea.  She does not have leg edema.  She denies syncope, palpitations or dizziness.  Normal range.  Her daughter is having trouble with her metoprolol tablets in half and wonders about stopping it.  ECHO in Sept 2020 showed dilated LV, reduction in LVEF to 45-50% and moderate MR. Follow-up coronary CTA 06/04/2019 showed nonobstructive CAD.  As before, activity level is primarily limited by low back pain and leg weakness due to degenerative spine disease.  She was subsequently diagnosed with worsening anemia and Hemoccult-positive stools.  Upper endoscopy showed 2 ulcers in the duodenal bulb which had visible vessels in the bottom of the crater, although they are not actively bleeding.  These were cauterized.  Pathology subsequently confirmed H. pylori.  Aspirin was stopped. Last Hgb in Jan 2021 was 11.7  Device function is normal.  Battery is at beginning of life with an estimated longevity of around 10 years.  Not have any underlying escape rhythm when pacing is taken down to VVI 30.  There is 22% atrial pacing  with appropriate heart rate histogram distribution.  There is 99% ventricular pacing.  Activity level is very low.  She continues to have relatively frequent but extremely brief episodes of atrial tachycardia, maximum 20 seconds duration, less than 1% overall burden.  She had minor coronary artery disease by previous angiography and has never had angina pectoris. She had acute HF exacerbation due to severe hypertension in the weeks after her husband passed away in 12/17/14. At that time she underwent coronary angiography that showed very minor disease (30% stenosis at the takeoff of the first diagonal artery), normal pulmonary wedge pressure and mild pulmonary hypertension (38/16).  Coronary CT angiography in October 2020 showed moderately elevated calcium score at the 62nd percentile for age/gender, and moderate (50-69%) lesions in the LAD artery and first diagonal artery, with no evidence of significant obstruction by FFR.  Past Medical History:  Diagnosis Date  . Arthritis    "hands, spine" (12/07/2014)  . CHB (complete heart block) ([place]) 09/19/2013  . CHF (congestive heart failure) ([place]) dx'd 12/06/2014  . Chronic pain    "since neck OR in 12-16-05"  . Coronary artery disease    normal coronaries by 09/10/00 cath  . Diabetes mellitus    hga1c 7.1 no meds  . Dysrhythmia   . HTN (hypertension) 09/19/2013  . Hypercholesterolemia   . Hyperlipidemia 09/19/2013  . Hypertension   . Myocardial infarction ([place]) dx'd Dec 17, 2007  . Neuropathy of both feet   . Pacemaker   . Paralysis of right hand [facility]) 12/16/05   "  after spinal cord injury/OR"  . Shortness of breath    exertional  . Type II diabetes mellitus ([place])    "had it years ago; lost weight after neck OR in 2007; glucose went way down; recently dx'd as returned recently" (12/07/2014)  . Urinary incontinence    "since 2007's OR"  . Venous insufficiency (chronic) (peripheral)     Past Surgical History:  Procedure Laterality Date  . ANTERIOR CERVICAL  DECOMP/DISCECTOMY FUSION  2007  . BACK SURGERY    . BIOPSY  07/29/2019   Procedure: BIOPSY;  Surgeon: Ladene Artist, MD;  Location: [facility] ENDOSCOPY;  Service: Endoscopy;;  . CARDIAC CATHETERIZATION  09/10/2000   normal Coronary arteries, EF60%,   . CARDIAC PACEMAKER PLACEMENT  12/01/2007   St.Jude Zephyr Elta [place] #[id number], dual chamber  . CARDIOVASCULAR STRESS TEST  08/28/2000   Cardiolite perfusion study, EF58%, low risk study,   . DILATION AND CURETTAGE OF UTERUS  1960's  . ESOPHAGOGASTRODUODENOSCOPY N/A 07/29/2019   Procedure: ESOPHAGOGASTRODUODENOSCOPY (EGD);  Surgeon: Ladene Artist, MD;  Location[facility] ENDOSCOPY;  Service: Endoscopy;  Laterality: N/A;  . HOT HEMOSTASIS N/A 07/29/2019   Procedure: HOT HEMOSTASIS (ARGON PLASMA COAGULATION/BICAP);  Surgeon: Ladene Artist, MD;  Location: [facility] ENDOSCOPY;  Service: Endoscopy;  Laterality: N/A;  . KNEE ARTHROSCOPY Right   . LAPAROSCOPIC CHOLECYSTECTOMY  1995  . LEFT AND RIGHT HEART CATHETERIZATION WITH CORONARY ANGIOGRAM N/A 12/14/2014   Procedure: LEFT AND RIGHT HEART CATHETERIZATION WITH CORONARY ANGIOGRAM;  Surgeon: Peter M Martinique, MD;  [facility] CATH LAB;  Service: Cardiovascular;  Laterality: N/A;  . Lower Ext. Dopplers  06/12/2011   no evidence of thrombus, all vessels normal in size, no evidence of insuffiency  . LUMBAR LAMINECTOMY/DECOMPRESSION MICRODISCECTOMY  03/02/2012   Procedure: LUMBAR LAMINECTOMY/DECOMPRESSION MICRODISCECTOMY 2 LEVELS;  Surgeon: Floyce Stakes, MD;  Location: [place] NEURO ORS;  Service: Neurosurgery;  Laterality: Bilateral;  Lumbar three, lumbar four, lumbar five Laminectomy/Foraminotomy  . MOLE REMOVAL     left side of face near her eye  . NM MYOCAR PERF WALL MOTION  10/24/2008   protocol:Lexiscan, EF63%, normal study, scan negative for ischemia  . PPM GENERATOR CHANGEOUT N/A 05/26/2018   Procedure: PPM GENERATOR CHANGEOUT;  Surgeon: Tammy Klein, MD;  Location: [place] CV LAB;  Service: Cardiovascular;   Laterality: N/A;  . TRANSTHORACIC ECHOCARDIOGRAM  11/30/2007   normal echo, mean transaortic valve gradient 62mmHg    Current Medications: Outpatient Medications Prior to Visit  Medication Sig Dispense Refill  . acetaminophen (TYLENOL) 500 MG tablet Take 500 mg by mouth every 6 (six) hours as needed for mild pain.     Marland Kitchen aspirin EC 81 MG tablet Take 81 mg by mouth daily.    . furosemide (LASIX) 40 MG tablet Take 1 tablet (40 mg total) by mouth daily. May take an extra tablet for weight gain of 2 lb in a day and 5 lbs in a weeks. 95 tablet 3  . KLOR-CON M20 20 MEQ tablet Take 20 mEq by mouth daily.    Marland Kitchen losartan (COZAAR) 50 MG tablet Take 50 mg by mouth daily.    . Nutritional Supplements (JUICE PLUS FIBRE PO) Take 3 tablets by mouth at bedtime.    . OXYGEN 2lpm with sleep and exertion [employer]    . pantoprazole (PROTONIX) 40 MG tablet Take 1 tablet (40 mg total) by mouth daily. 90 tablet 3  . pravastatin (PRAVACHOL) 40 MG tablet Take 1 tablet (40 mg total) by mouth daily. 90 tablet  0  . Respiratory Therapy Supplies (FLUTTER) DEVI Use flutter device three times a day as instructed 1 each 0  . metoprolol succinate (TOPROL-XL) 25 MG 24 hr tablet TAKE 0.5 TABLETS (12.5 MG TOTAL) BY MOUTH DAILY. TAKE WITH OR IMMEDIATELY FOLLOWING A MEAL. 45 tablet 2   No facility-administered medications prior to visit.     Allergies:   Patient has no known allergies.   Social History   Socioeconomic History  . Marital status: Widowed    Spouse name: Not on file  . Number of children: 2  . Years of education: Not on file  . Highest education level: Not on file  Occupational History  . Occupation: Self employed- Academic librarian business -retired  Tobacco Use  . Smoking status: Former Smoker    Packs/day: 0.50    Years: 32.00    Pack years: 16.00    Types: Cigarettes    Start date: 1963    Quit date: 03/02/1994    Years since quitting: 26.0  . Smokeless tobacco: Never Used  Vaping Use  . Vaping Use: Never  used  Substance and Sexual Activity  . Alcohol use: No  . Drug use: No  . Sexual activity: Not Currently    Birth control/protection: None  Other Topics Concern  . Not on file  Social History Narrative  . Not on file   Social Determinants of Health   Financial Resource Strain:   . Difficulty of Paying Living Expenses:   Food Insecurity:   . Worried About Charity fundraiser in the Last Year:   . Arboriculturist in the Last Year:   Transportation Needs:   . Film/video editor (Medical):   Marland Kitchen Lack of Transportation (Non-Medical):   Physical Activity:   . Days of Exercise per Week:   . Minutes of Exercise per Session:   Stress:   . Feeling of Stress :   Social Connections:   . Frequency of Communication with Friends and Family:   . Frequency of Social Gatherings with Friends and Family:   . Attends Religious Services:   . Active Member of Clubs or Organizations:   . Attends Archivist Meetings:   Marland Kitchen Marital Status:      Family History:  The patient's \  family history includes Congestive Heart Failure in her brother, father, and mother; Diabetes in her brother, father, and mother; Heart attack in her father; Heart disease in her father and mother; Hyperlipidemia in her father and mother; Hypertension in her father and mother.   ROS:   Please see the history of present illness.    ROS All other systems are reviewed and are negative.  PHYSICAL EXAM:   VS:  BP 114/64   Pulse 64   Ht 5\' 5"  (1.651 m)   Wt 151 lb (68.5 kg)   SpO2 96%   BMI 25.13 kg/m      General: Alert, oriented x3, no distress, appears well, but elderly and frail.  Healthy left subclavian pacemaker site.  Prominent scoliosis Head: no evidence of trauma, PERRL, EOMI, no exophtalmos or lid lag, no myxedema, no xanthelasma; normal ears, nose and oropharynx Neck: normal jugular venous pulsations and no hepatojugular reflux; brisk carotid pulses without delay and no carotid bruits Chest: clear to  auscultation, no signs of consolidation by percussion or palpation, normal fremitus, symmetrical and full respiratory excursions Cardiovascular: normal position and quality of the apical impulse, regular rhythm, normal first and paradoxically split second heart sounds,  no murmurs, rubs or gallops Abdomen: no tenderness or distention, no masses by palpation, no abnormal pulsatility or arterial bruits, normal bowel sounds, no hepatosplenomegaly Extremities: no clubbing, cyanosis or edema; 2+ radial, ulnar and brachial pulses bilaterally; 2+ right femoral, posterior tibial and dorsalis pedis pulses; 2+ left femoral, posterior tibial and dorsalis pedis pulses; no subclavian or femoral bruits Neurological: grossly nonfocal Psych: Normal mood and affect  Wt Readings from Last 3 Encounters:  03/12/20 151 lb (68.5 kg)  01/24/20 151 lb (68.5 kg)  10/06/19 157 lb (71.2 kg)    Studies/Labs Reviewed:   Coronary CT Angio 06/04/2019: Dilated right pulmonary artery (92mm) and left pulmonary artery (36mm)  IMPRESSION: 1. Coronary calcium score of 179. This was 66 percentile for age and sex matched control.  2. Normal coronary origin with right dominance.  3. Nonobstructive CAD. There is calcified plaque in the mid LAD causing moderate (50-69%) stenosis. CT-FFR across this lesion is 0.85, suggesting lesion is not functionally significant. The plaque extends into the ostium of a diagonal branch, causing moderate (50-69%) stenosis. CT-FFR across the diagonal branch is 0.84, suggesting no functional significance  4. There is step artifact in the mid RCA. While no plaque is seen in this segment, definitive interpretation is precluded by artifact  CAD-RADS 3. Moderate stenosis. Consider symptom-guided anti-ischemic pharmacotherapy as well as risk factor modification per guideline directed care. Additional analysis with CT FFR will be submitted.  ECHO 05/13/2019:  1. Moderate hypokinesis of the  left ventricular, entire inferoseptal wall.  2. The left ventricle has mildly reduced systolic function, with an ejection fraction of 45-50%. The cavity size was mild to moderately dilated. Left ventricular diastolic Doppler parameters are consistent with pseudonormalization. Elevated left atrial  and left ventricular end-diastolic pressures The E/e' is 16. There is abnormal septal motion consistent with RV pacemaker. Left ventricular diffuse hypokinesis.  3. The right ventricle has normal systolic function. The cavity was mildly enlarged. There is no increase in right ventricular wall thickness. Right ventricular systolic pressure is mildly elevated.  4. Left atrial size was severely dilated.  5. Right atrial size was mildly dilated.  6. The mitral valve is abnormal. Moderate thickening of the mitral valve leaflet. Moderate calcification of the mitral valve leaflet. Mitral valve regurgitation is moderate by color flow Doppler.  7. Tethered posterior leaflet.  8. Tricuspid valve regurgitation is moderate.  9. The aortic valve is tricuspid. Moderate thickening of the aortic valve. Moderate calcification of the aortic valve. Aortic valve regurgitation is mild to moderate by color flow Doppler. Mild stenosis of the aortic valve. 10. The aorta is normal unless otherwise noted. 11. The aortic root, ascending aorta and aortic arch are normal in size and structure.  SUMMARY   Since last study, LV cavity more dilated, EF lower. There is septal dyssynchrony from pacing, but the inferoseptum also does not thicken. Posterior mitral valve leaflet appears tethered.  EKG:  EKG is ordered today.  It shows mostly atrial sensed ventricular paced rhythm with occasional PVCs followed by dual AV pacing.  The QRS is only moderately brought out 160 ms, QTC 514 ms Recent Labs: 05/05/2019: Pro B Natriuretic peptide (BNP) 1,161.0 07/18/2019: TSH 2.050 07/28/2019: ALT 14; B Natriuretic Peptide 126.7 08/08/2019: BUN 24;  Creatinine, Ser 0.77; Potassium 4.0; Sodium 136 09/09/2019: Hemoglobin 11.7; Platelets 178.0   Lipid Panel    Component Value Date/Time   CHOL 160 07/18/2019 1028   TRIG 137 07/18/2019 1028   HDL 63 07/18/2019 1028  CHOLHDL 2.5 07/18/2019 1028   CHOLHDL 3.7 11/30/2007 0510   VLDL 35 11/30/2007 0510   LDLCALC 73 07/18/2019 1028   LDLDIRECT 76 07/18/2019 1028    ASSESSMENT:    1. Chronic combined systolic and diastolic CHF (congestive heart failure) (HCC)   2. Paroxysmal atrial tachycardia ([place])   3. Coronary artery disease involving native coronary artery of native heart without angina pectoris   4. Controlled type 2 diabetes mellitus without complication, without long-term current use of insulin ([place])   5. CHB (complete heart block) (HCC)   6. Pacemaker - dual chamber St Jude, 2009   7. COPD GOLD II   8. PAH (pulmonary artery hypertension) ([place])   9. Iron deficiency anemia due to chronic blood loss   10. Peptic ulcer disease with hemorrhage    PLAN:  In order of problems listed above:  1. CHF: Euvolemic clinically.  Asymptomatic but very sedentary.  Primarily limited by scoliosis and COPD rather than heart failure.  On ARB.  We will try to discontinue her beta-blocker since her blood pressure is trending downwards. 2. PAT: Frequent brief asymptomatic and not hemodynamically significant due to its brevity and reviewed presence of complete heart block. 3. CAD: She does not have angina pectoris.  She has had some progression in coronary disease compared to remote coronary angiography, based on recent coronary CT angiogram, but no revascularization is necessary.  Focus on treatment of risk factors.  Continue her statin.  Most recent LDL cholesterol 76 was very close to target.   4. Pre AF:[id number] A1c has decreased further to 5.8% and she is no longer taking Metformin. 5. CHB: pacemaker dependent.  6. PM: Normal device function, remote downloads every 3 months and yearly office  visits 7. COPD:  Follows with Dr. Melvyn Novas, she was using oxygen at home 100% of the time at her last appointment, but is improving, may be due to treatment for her anemia. 8. PAH: Previous right heart catheterization showed that she had mild pulmonary hypertension in the absence of elevation in left heart pressures.  Pulmonary vascular disease is primarily driven by her chronic lung disease. 9. Anemia: Hemoglobin was steadily improving, has not been rechecked since January. 10. Peptic ulcer disease: Pathology confirmed the presence of Helicobacter pylori.  She has completed treatment for this (doxycycline, Pepto-Bismol, PPI).  Aspirin has been restarted    Medication Adjustments/Labs and Tests Ordered: Current medicines are reviewed at length with the patient today.  Concerns regarding medicines are outlined above.  Medication changes, Labs and Tests ordered today are listed in the Patient Instructions below. Patient Instructions  Medication Instructions:  STOP the Metoprolol Succinate  *If you need a refill on your cardiac medications before your next appointment, please call your pharmacy*   Lab Work: None ordered If you have labs (blood work) drawn today and your tests are completely normal, you will receive your results only by: Marland Kitchen MyChart Message (if you have MyChart) OR . A paper copy in the mail If you have any lab test that is abnormal or we need to change your treatment, we will call you to review the results.   Testing/Procedures: None ordered   Follow-Up: At [facility], you and your health needs are our priority.  As part of our continuing mission to provide you with exceptional heart care, we have created designated Provider Care Teams.  These Care Teams include your primary Cardiologist (physician) and Advanced Practice Providers (APPs -  Physician Assistants and Nurse Practitioners) who  all work together to provide you with the care you need, when you need it.  We  recommend signing up for the patient portal called "MyChart".  Sign up information is provided on this After Visit Summary.  MyChart is used to connect with patients for Virtual Visits (Telemedicine).  Patients are able to view lab/test results, encounter notes, upcoming appointments, etc.  Non-urgent messages can be sent to your provider as well.   To learn more about what you can do with MyChart, go to [email or web address].    Your next appointment:   12 month(s)  The format for your next appointment:   In Person  Provider:   Sanda Klein, MD     Signed, Tammy Klein, MD  03/12/2020 12:00 PM    [address], [place], [place]  [address] Phone: [phone]; Fax: [phone]

## 2020-04-03 DIAGNOSIS — Z23 Encounter for immunization: Secondary | ICD-10-CM | POA: Diagnosis not present

## 2020-04-10 DIAGNOSIS — I10 Essential (primary) hypertension: Secondary | ICD-10-CM | POA: Diagnosis not present

## 2020-04-10 DIAGNOSIS — Z8249 Family history of ischemic heart disease and other diseases of the circulatory system: Secondary | ICD-10-CM | POA: Diagnosis not present

## 2020-04-10 DIAGNOSIS — E7800 Pure hypercholesterolemia, unspecified: Secondary | ICD-10-CM | POA: Diagnosis not present

## 2020-04-10 DIAGNOSIS — Z1379 Encounter for other screening for genetic and chromosomal anomalies: Secondary | ICD-10-CM | POA: Diagnosis not present

## 2020-04-18 ENCOUNTER — Other Ambulatory Visit: Payer: Self-pay | Admitting: Medical

## 2020-04-18 NOTE — Telephone Encounter (Signed)
This is Dr. [name]'s pt 

## 2020-04-18 NOTE — Telephone Encounter (Signed)
Rx has been sent to the pharmacy electronically. ° °

## 2020-04-24 ENCOUNTER — Other Ambulatory Visit: Payer: Self-pay | Admitting: Physician Assistant

## 2020-04-24 DIAGNOSIS — D649 Anemia, unspecified: Secondary | ICD-10-CM

## 2020-04-24 DIAGNOSIS — E119 Type 2 diabetes mellitus without complications: Secondary | ICD-10-CM

## 2020-04-24 DIAGNOSIS — E785 Hyperlipidemia, unspecified: Secondary | ICD-10-CM

## 2020-04-24 DIAGNOSIS — I10 Essential (primary) hypertension: Secondary | ICD-10-CM

## 2020-04-25 ENCOUNTER — Other Ambulatory Visit: Payer: Self-pay

## 2020-04-25 ENCOUNTER — Other Ambulatory Visit: Payer: Medicare Other

## 2020-04-25 DIAGNOSIS — E119 Type 2 diabetes mellitus without complications: Secondary | ICD-10-CM | POA: Diagnosis not present

## 2020-04-25 DIAGNOSIS — D649 Anemia, unspecified: Secondary | ICD-10-CM | POA: Diagnosis not present

## 2020-04-25 DIAGNOSIS — I10 Essential (primary) hypertension: Secondary | ICD-10-CM | POA: Diagnosis not present

## 2020-04-25 DIAGNOSIS — E785 Hyperlipidemia, unspecified: Secondary | ICD-10-CM

## 2020-04-26 LAB — CBC
Hematocrit: 38.2 % (ref 34.0–46.6)
Hemoglobin: 12.5 g/dL (ref 11.1–15.9)
MCH: 30.2 pg (ref 26.6–33.0)
MCHC: 32.7 g/dL (ref 31.5–35.7)
MCV: 92 fL (ref 79–97)
Platelets: 171 10*3/uL (ref 150–450)
RBC: 4.14 x10E6/uL (ref 3.77–5.28)
RDW: 12.4 % (ref 11.7–15.4)
WBC: 7.6 10*3/uL (ref 3.4–10.8)

## 2020-04-26 LAB — COMPREHENSIVE METABOLIC PANEL
ALT: 8 IU/L (ref 0–32)
AST: 16 IU/L (ref 0–40)
Albumin/Globulin Ratio: 2.1 (ref 1.2–2.2)
Albumin: 4.6 g/dL (ref 3.7–4.7)
Alkaline Phosphatase: 50 IU/L (ref 48–121)
BUN/Creatinine Ratio: 27 (ref 12–28)
BUN: 17 mg/dL (ref 8–27)
Bilirubin Total: 0.5 mg/dL (ref 0.0–1.2)
CO2: 24 mmol/L (ref 20–29)
Calcium: 9 mg/dL (ref 8.7–10.3)
Chloride: 99 mmol/L (ref 96–106)
Creatinine, Ser: 0.63 mg/dL (ref 0.57–1.00)
GFR calc Af Amer: 98 mL/min/{1.73_m2} (ref >59)
GFR calc non Af Amer: 85 mL/min/{1.73_m2} (ref >59)
Globulin, Total: 2.2 g/dL (ref 1.5–4.5)
Glucose: 95 mg/dL (ref 65–99)
Potassium: 4.1 mmol/L (ref 3.5–5.2)
Sodium: 139 mmol/L (ref 134–144)
Total Protein: 6.8 g/dL (ref 6.0–8.5)

## 2020-04-26 LAB — LIPID PANEL
Chol/HDL Ratio: 2.4 ratio (ref 0.0–4.4)
Cholesterol, Total: 147 mg/dL (ref 100–199)
HDL: 61 mg/dL (ref >39)
LDL Chol Calc (NIH): 61 mg/dL (ref 0–99)
Triglycerides: 146 mg/dL (ref 0–149)
VLDL Cholesterol Cal: 25 mg/dL (ref 5–40)

## 2020-04-26 LAB — HEMOGLOBIN A1C
Est. average glucose Bld gHb Est-mCnc: 131 mg/dL
Hgb A1c MFr Bld: 6.2 % — ABNORMAL HIGH (ref 4.8–5.6)

## 2020-04-26 LAB — TSH: TSH: 1.91 u[IU]/mL (ref 0.450–4.500)

## 2020-04-27 ENCOUNTER — Other Ambulatory Visit: Payer: Self-pay

## 2020-04-27 ENCOUNTER — Encounter: Payer: Self-pay | Admitting: Physician Assistant

## 2020-04-27 ENCOUNTER — Ambulatory Visit (INDEPENDENT_AMBULATORY_CARE_PROVIDER_SITE_OTHER): Payer: Medicare Other | Admitting: Physician Assistant

## 2020-04-27 VITALS — Ht 64.0 in | Wt 147.0 lb

## 2020-04-27 DIAGNOSIS — E119 Type 2 diabetes mellitus without complications: Secondary | ICD-10-CM | POA: Diagnosis not present

## 2020-04-27 DIAGNOSIS — D500 Iron deficiency anemia secondary to blood loss (chronic): Secondary | ICD-10-CM | POA: Diagnosis not present

## 2020-04-27 DIAGNOSIS — E1169 Type 2 diabetes mellitus with other specified complication: Secondary | ICD-10-CM

## 2020-04-27 DIAGNOSIS — I10 Essential (primary) hypertension: Secondary | ICD-10-CM | POA: Diagnosis not present

## 2020-04-27 DIAGNOSIS — I5042 Chronic combined systolic (congestive) and diastolic (congestive) heart failure: Secondary | ICD-10-CM

## 2020-04-27 DIAGNOSIS — E785 Hyperlipidemia, unspecified: Secondary | ICD-10-CM | POA: Diagnosis not present

## 2020-04-27 NOTE — Progress Notes (Signed)
Telehealth office visit note for Tammy Reid, PA-C- at Primary Care at [facility]   I connected with current patient today by telephone and verified that I am speaking with the correct person   . Location of the patient: Home . Location of the provider: Office - This visit type was conducted due to national recommendations for restrictions regarding the COVID-19 Pandemic (e.g. social distancing) in an effort to limit this patient's exposure and mitigate transmission in our community.    - No physical exam could be performed with this format, beyond that communicated to [place] by the patient/ family members as noted.   - Additionally my office staff/ schedulers were to discuss with the patient that there may be a monetary charge related to this service, depending on their medical insurance.  My understanding is that patient understood and consented to proceed.     _________________________________________________________________________________   History of Present Illness: Pt calls in to follow up on diabetes mellitus, hypertension, hyperlipidemia and discuss most recent labs which most were essentially within normal limits. Pt reports she is doing well and has no acute concerns today. Patient's daughter [name] is also on the line.  Diabetes: Pt denies increased urination or thirst.  No hypoglycemic events. Checking glucose at home. FBS range 115-130s. She continues to follow low carbohydrate and glucose diet.   HTN: Pt denies chest pain, palpitations, dizziness or lower extremity swelling. Taking medication as directed without side effects. Checks BP at home and readings usually range 115-135/50-70s. Pt reads labels to check sodium content and doesn't add salt to food.   HLD: Pt taking medication as directed without issues. Denies side effects including myalgias and RUQ pain. Reports she doesn't eat red meat or process foods. She limits her red meat and eats lean meats and  vegetables.  CHF: Pt is followed by cardiology. Pt's daughter states Dr. Sallyanne Kuster discontinued beta blocker since blood pressure was coming down. She knows when to take an extra Lasix if needed.       No flowsheet data found.  Depression screen [facility] 2/9 04/27/2020 01/24/2020 06/15/2019 12/06/2018 10/20/2018  Decreased Interest 0 0 0 0 0  Down, Depressed, Hopeless 0 0 0 0 0  PHQ - 2 Score 0 0 0 0 0  Altered sleeping 0 0 3 0 1  Tired, decreased energy 0 0 1 0 1  Change in appetite 0 0 0 0 0  Feeling bad or failure about yourself  0 0 0 0 0  Trouble concentrating 0 0 - 0 0  Moving slowly or fidgety/restless 0 0 0 0 0  Suicidal thoughts 0 0 0 0 0  PHQ-9 Score 0 0 4 0 2  Difficult doing work/chores - - Not difficult at all - Not difficult at all      Impression and Recommendations:     1. Controlled type 2 diabetes mellitus without complication, without long-term current use of insulin ([place])   2. Hyperlipidemia associated with type 2 diabetes mellitus ([place])   3. Essential hypertension   4. Chronic combined systolic and diastolic CHF (congestive heart failure) ([place])   5. Iron deficiency anemia due to chronic blood loss     Controlled type 2 diabetes mellitus without complication, without long-term current use of insulin: -Most recent A1c 6.2, stable -Continue with low carbohydrate and glucose diet. -Continue ambulatory glucose monitoring.  Notify the clinic if glucose consistently <80 or > 160. -Will continue to monitor.  If A1c continues  to trend upwards will consider restarting Metformin.  Hyperlipidemia associated with type 2 diabetes mellitus: -Most recent lipid panel WNL, LDL 61 -Continue current medication regimen. -Follow heart healthy diet. -Will continue to monitor.  Essential hypertension: -BP stable -Most recent CMP WNL -Continue current medication regimen. -Continue ambulatory BP/pulse monitoring. -Continue low-sodium diet.  CHF: -Followed by cardiology-Dr.  Sallyanne Kuster -Stable  -Continue current medication regimen.  Iron deficiency anemia: -Most recent CBC is wnl, hemoglobin 12.5 hematocrit 38.2 -Will continue to monitor    - As part of my medical decision making, I reviewed the following data within the [place] History obtained from pt /family, CMA notes reviewed and incorporated if applicable, Labs reviewed, Radiograph/ tests reviewed if applicable and OV notes from prior OV's with me, as well as any other specialists she/he has seen since seeing me last, were all reviewed and used in my medical decision making process today.    - Additionally, when appropriate, discussion had with patient regarding our treatment plan, and their biases/concerns about that plan were used in my medical decision making today.    - The patient agreed with the plan and demonstrated an understanding of the instructions.   No barriers to understanding were identified.     - The patient was advised to call back or seek an in-person evaluation if the symptoms worsen or if the condition fails to improve as anticipated.   No follow-ups on file.    No orders of the defined types were placed in this encounter.   No orders of the defined types were placed in this encounter.   There are no discontinued medications.     Time spent on visit including pre-visit chart review and post-visit care was 15 minutes.      The [place] was signed into law in 2016 which includes the topic of electronic health records.  This provides immediate access to information in MyChart.  This includes consultation notes, operative notes, office notes, lab results and pathology reports.  If you have any questions about what you read please let us know at your next visit or call us at the office.  We are right here with you.   __________________________________________________________________________________     Patient Care Team    Relationship  Specialty Notifications Start End  [name], [place] PCP - General Physician Assistant  01/24/20   Sanda Klein, MD PCP - Cardiology Cardiology Admissions 09/08/18      -Vitals obtained; medications/ allergies reconciled;  personal medical, social, Sx etc.histories were updated by CMA, reviewed by me and are reflected in chart   Patient Active Problem List   Diagnosis Date Noted  . History of Helicobacter infection 10/06/2019  . Bleeding duodenal ulcer 10/06/2019  . Peptic ulcer disease 10/06/2019  . PAH (pulmonary artery hypertension) ([place]) 08/19/2019  . Symptomatic anemia 07/29/2019  . Mild renal insufficiency 07/29/2019  . Pressure injury of skin 07/29/2019  . Near syncope   . Melena   . Normocytic anemia 07/04/2019  . Heme positive stool 07/04/2019  . Other constipation 07/04/2019  . Low hemoglobin 06/15/2019  . Physical deconditioning 05/05/2019  . Healthcare maintenance 10/20/2018  . Other chronic pain 10/20/2018  . Rib fracture 07/02/2018  . Diabetes mellitus type 2, controlled, without complications ([place]) 63/14/9702  . Acute respiratory failure with hypoxia ([place]) 07/01/2018  . Pacemaker battery depletion 05/26/2018  . Mild obesity 01/05/2016  . Chronic respiratory failure with hypoxia ([place]) 10/29/2015  . Upper airway cough syndrome  03/04/2015  . COPD GOLD II 01/18/2015  . Pulmonary hypertension ([place])   . Dyspnea   . DOE (dyspnea on exertion)   . Sleep apnea   . Hypoxemia   . Acute respiratory failure with hypoxemia ([place])   . Acute pulmonary edema (HCC)   . Chronic combined systolic and diastolic CHF (congestive heart failure) ([place]) 12/06/2014  . Accelerated hypertension with diastolic congestive heart failure, NYHA class 3 ([place]) 12/06/2014  . CHB (complete heart block) ([place]) 09/19/2013  . Pacemaker - dual chamber St Jude, 2009 09/19/2013  . Essential hypertension 09/19/2013  . Normal coronary arteries angiography 2002 09/19/2013  . Hyperlipidemia 09/19/2013   . Varicose veins of lower extremities with other complications 66/44/0347     Current Meds  Medication Sig  . acetaminophen (TYLENOL) 500 MG tablet Take 500 mg by mouth every 6 (six) hours as needed for mild pain.   Marland Kitchen aspirin EC 81 MG tablet Take 81 mg by mouth daily.  . furosemide (LASIX) 40 MG tablet TAKE 1 TABLET DAILY. MAY TAKE AN EXTRA TABLET FOR WEIGHT GAIN OF 2 LB IN A DAY & 5 LBS IN A WEEK  . KLOR-CON M20 20 MEQ tablet Take 20 mEq by mouth daily.  Marland Kitchen losartan (COZAAR) 50 MG tablet Take 50 mg by mouth daily.  . Nutritional Supplements (JUICE PLUS FIBRE PO) Take 3 tablets by mouth at bedtime.  . OXYGEN 2lpm with sleep and exertion [employer]  . pantoprazole (PROTONIX) 40 MG tablet Take 1 tablet (40 mg total) by mouth daily.  . pravastatin (PRAVACHOL) 40 MG tablet Take 1 tablet (40 mg total) by mouth daily.  Marland Kitchen Respiratory Therapy Supplies (FLUTTER) DEVI Use flutter device three times a day as instructed     Allergies:  No Known Allergies   ROS:  See above HPI for pertinent positives and negatives   Objective:   Height 5\' 4"  (1.626 m), weight 147 lb (66.7 kg).  (if some vitals are omitted, this means that patient was UNABLE to obtain them even though they were asked to get them prior to OV today.  They were asked to call us at their earliest convenience with these once obtained. ) General: A & O * 3; sounds in no acute distress; in usual state of health.  Respiratory: speaking in full sentences, no conversational dyspnea; Psych: insight appears good, mood- appears full

## 2020-04-30 DIAGNOSIS — Z23 Encounter for immunization: Secondary | ICD-10-CM | POA: Diagnosis not present

## 2020-05-06 ENCOUNTER — Other Ambulatory Visit: Payer: Self-pay | Admitting: Physician Assistant

## 2020-05-08 ENCOUNTER — Telehealth: Payer: Self-pay | Admitting: Physician Assistant

## 2020-05-08 NOTE — Telephone Encounter (Signed)
Please review below and advise. AS, CMA 

## 2020-05-08 NOTE — Telephone Encounter (Signed)
Patient's daughter called and is requesting a call back from clinic staff to discuss her statin. They have been trying to get her off as many meds as possible (she is already off a BP med from cards and metformin) as she has made several lifestyle changes. The last lab work came back normal for lipid panel and they want to see about taking her off the statin for a month or so to see if the level stay normal with these new diet and lifestyle changes. Please contact patient with care plan.

## 2020-05-10 NOTE — Telephone Encounter (Signed)
Ok to trial being off statin and want to recheck lipid panel at next follow-up visit. Since patient has diabetes goal for bad cholesterol is <70 and most recent LDL was at goal (61) but if LDL isn't at goal at recheck then would recommend to restart statin.   Thank you, [name]

## 2020-05-11 NOTE — Telephone Encounter (Signed)
[name] is aware of the below and verbalized understanding. AS, CMA

## 2020-05-14 DIAGNOSIS — Z23 Encounter for immunization: Secondary | ICD-10-CM | POA: Diagnosis not present

## 2020-05-23 DIAGNOSIS — M1712 Unilateral primary osteoarthritis, left knee: Secondary | ICD-10-CM | POA: Diagnosis not present

## 2020-05-23 DIAGNOSIS — M25562 Pain in left knee: Secondary | ICD-10-CM | POA: Diagnosis not present

## 2020-05-27 ENCOUNTER — Other Ambulatory Visit: Payer: Self-pay | Admitting: Medical

## 2020-05-28 NOTE — Telephone Encounter (Signed)
This is Dr. [name]'s pt 

## 2020-05-30 DIAGNOSIS — M1712 Unilateral primary osteoarthritis, left knee: Secondary | ICD-10-CM | POA: Diagnosis not present

## 2020-05-30 DIAGNOSIS — M25562 Pain in left knee: Secondary | ICD-10-CM | POA: Diagnosis not present

## 2020-06-06 ENCOUNTER — Ambulatory Visit (INDEPENDENT_AMBULATORY_CARE_PROVIDER_SITE_OTHER): Payer: Medicare Other

## 2020-06-06 DIAGNOSIS — I442 Atrioventricular block, complete: Secondary | ICD-10-CM | POA: Diagnosis not present

## 2020-06-07 DIAGNOSIS — M25562 Pain in left knee: Secondary | ICD-10-CM | POA: Diagnosis not present

## 2020-06-07 DIAGNOSIS — M1712 Unilateral primary osteoarthritis, left knee: Secondary | ICD-10-CM | POA: Diagnosis not present

## 2020-06-07 LAB — CUP PACEART REMOTE DEVICE CHECK
Battery Remaining Longevity: 121 mo
Battery Remaining Percentage: 95.5 %
Battery Voltage: 2.99 V
Brady Statistic AP VP Percent: 36 %
Brady Statistic AP VS Percent: 1 %
Brady Statistic AS VP Percent: 62 %
Brady Statistic AS VS Percent: 1 %
Brady Statistic RA Percent Paced: 33 %
Brady Statistic RV Percent Paced: 98 %
Date Time Interrogation Session: 20211007040144
Implantable Lead Implant Date: 20090401
Implantable Lead Implant Date: 20090401
Implantable Lead Location: 753859
Implantable Lead Location: 753860
Implantable Pulse Generator Implant Date: 20190925
Lead Channel Impedance Value: 460 Ohm
Lead Channel Impedance Value: 480 Ohm
Lead Channel Pacing Threshold Amplitude: 0.75 V
Lead Channel Pacing Threshold Amplitude: 0.75 V
Lead Channel Pacing Threshold Pulse Width: 0.5 ms
Lead Channel Pacing Threshold Pulse Width: 0.5 ms
Lead Channel Sensing Intrinsic Amplitude: 12 mV
Lead Channel Sensing Intrinsic Amplitude: 3.1 mV
Lead Channel Setting Pacing Amplitude: 1 V
Lead Channel Setting Pacing Amplitude: 2 V
Lead Channel Setting Pacing Pulse Width: 0.5 ms
Lead Channel Setting Sensing Sensitivity: 4 mV
Pulse Gen Model: 2272
Pulse Gen Serial Number: 9064942

## 2020-06-11 NOTE — Progress Notes (Signed)
Remote pacemaker transmission.   

## 2020-06-18 ENCOUNTER — Other Ambulatory Visit: Payer: Self-pay | Admitting: Medical

## 2020-06-25 DIAGNOSIS — Z23 Encounter for immunization: Secondary | ICD-10-CM | POA: Diagnosis not present

## 2020-06-28 ENCOUNTER — Telehealth: Payer: Self-pay | Admitting: Cardiovascular Disease

## 2020-06-28 NOTE — Telephone Encounter (Signed)
Tired to call patient's daughter back about her message. There was no answer, and no voicemail to leave message.

## 2020-06-28 NOTE — Telephone Encounter (Signed)
Pt c/o medication issue:  1. Name of Medication: KLOR-CON M20 20 MEQ tablet  2. How are you currently taking this medication (dosage and times per day)?   3. Are you having a reaction (difficulty breathing--STAT)?   4. What is your medication issue? Daughter wanted to know if it was still necessary for the patient to take this medication. The prescriber on the bottle was Roby Lofts- PA_C. The daughter is trying to take the patient off of as many medications as possible. Please advise

## 2020-06-29 DIAGNOSIS — L820 Inflamed seborrheic keratosis: Secondary | ICD-10-CM | POA: Diagnosis not present

## 2020-07-02 NOTE — Telephone Encounter (Signed)
Attempted to call the pts daughter back on her mobile number but we were disconnected and when calling back in says she is not in a service area.   Will try again later.

## 2020-07-04 NOTE — Telephone Encounter (Signed)
Mailbox is full unable to leave message Will try later ./cy

## 2020-07-05 MED ORDER — KLOR-CON M20 20 MEQ PO TBCR
20.0000 meq | EXTENDED_RELEASE_TABLET | Freq: Every day | ORAL | 3 refills | Status: DC
Start: 2020-07-05 — End: 2021-06-26

## 2020-07-05 NOTE — Telephone Encounter (Signed)
Spoke with pt, aware she will need to continue to take the potassium. Refill sent to the pharmacy.

## 2020-07-23 ENCOUNTER — Other Ambulatory Visit: Payer: Self-pay

## 2020-07-23 DIAGNOSIS — E119 Type 2 diabetes mellitus without complications: Secondary | ICD-10-CM

## 2020-07-30 ENCOUNTER — Other Ambulatory Visit: Payer: Self-pay

## 2020-07-30 ENCOUNTER — Other Ambulatory Visit: Payer: Medicare Other

## 2020-07-30 DIAGNOSIS — E119 Type 2 diabetes mellitus without complications: Secondary | ICD-10-CM

## 2020-07-31 LAB — HEMOGLOBIN A1C
Est. average glucose Bld gHb Est-mCnc: 131 mg/dL
Hgb A1c MFr Bld: 6.2 % — ABNORMAL HIGH (ref 4.8–5.6)

## 2020-08-02 ENCOUNTER — Ambulatory Visit (INDEPENDENT_AMBULATORY_CARE_PROVIDER_SITE_OTHER): Payer: Medicare Other | Admitting: Physician Assistant

## 2020-08-02 ENCOUNTER — Encounter: Payer: Self-pay | Admitting: Physician Assistant

## 2020-08-02 ENCOUNTER — Other Ambulatory Visit: Payer: Self-pay

## 2020-08-02 VITALS — Ht 64.0 in | Wt 143.0 lb

## 2020-08-02 DIAGNOSIS — M170 Bilateral primary osteoarthritis of knee: Secondary | ICD-10-CM | POA: Diagnosis not present

## 2020-08-02 DIAGNOSIS — M25562 Pain in left knee: Secondary | ICD-10-CM | POA: Diagnosis not present

## 2020-08-02 DIAGNOSIS — E785 Hyperlipidemia, unspecified: Secondary | ICD-10-CM | POA: Diagnosis not present

## 2020-08-02 DIAGNOSIS — E1169 Type 2 diabetes mellitus with other specified complication: Secondary | ICD-10-CM | POA: Diagnosis not present

## 2020-08-02 DIAGNOSIS — I10 Essential (primary) hypertension: Secondary | ICD-10-CM

## 2020-08-02 DIAGNOSIS — E119 Type 2 diabetes mellitus without complications: Secondary | ICD-10-CM

## 2020-08-02 NOTE — Progress Notes (Signed)
Telehealth office visit note for Tammy Reid, PA-C- at Primary Care at [facility]   I connected with current patient today by telephone and verified that I am speaking with the correct person   . Location of the patient: Home . Location of the provider: Office - This visit type was conducted due to national recommendations for restrictions regarding the COVID-19 Pandemic (e.g. social distancing) in an effort to limit this patient's exposure and mitigate transmission in our community.    - No physical exam could be performed with this format, beyond that communicated to [place] by the patient/ family members as noted.   - Additionally my office staff/ schedulers were to discuss with the patient that there may be a monetary charge related to this service, depending on their medical insurance.  My understanding is that patient understood and consented to proceed.     _________________________________________________________________________________   History of Present Illness: Patient calls in to follow-up on diabetes mellitus, hypertension and hyperlipidemia.  Has no acute concerns today.  Diabetes mellitus: Pt denies increased urination or thirst. No hypoglycemic events. Checking glucose at home. Patient's daughter reports patient has been noticing some elevations in fasting blood sugars which range from 120s to 140s.  Patient follows a low glucose diet.  HTN: Pt denies chest pain, palpitations, dizziness or lower extremity swelling. Taking medication as directed without side effects. Checks BP at home usually first thing in the morning before taking medications and readings range in 130s-150s/60-70s. Pt follows a low salt diet.  HLD: Pt is currently managing cholesterol with diet and is trying to watch saturated and transfats.     No flowsheet data found.  Depression screen [facility] 2/9 04/27/2020 01/24/2020 06/15/2019 12/06/2018 10/20/2018  Decreased Interest 0 0 0 0 0  Down, Depressed,  Hopeless 0 0 0 0 0  PHQ - 2 Score 0 0 0 0 0  Altered sleeping 0 0 3 0 1  Tired, decreased energy 0 0 1 0 1  Change in appetite 0 0 0 0 0  Feeling bad or failure about yourself  0 0 0 0 0  Trouble concentrating 0 0 - 0 0  Moving slowly or fidgety/restless 0 0 0 0 0  Suicidal thoughts 0 0 0 0 0  PHQ-9 Score 0 0 4 0 2  Difficult doing work/chores - - Not difficult at all - Not difficult at all      Impression and Recommendations:     1. Controlled type 2 diabetes mellitus without complication, unspecified whether long term insulin use ([name])   2. Hyperlipidemia associated with type 2 diabetes mellitus ([name])   3. Essential hypertension     Controlled type 2 diabetes mellitus without complication, unspecified whether long term insulin use: -Most recent A1c 6.2, continues to be at goal.  Diet controlled. -Discussed with patient and daughter to continue ambulatory glucose monitoring and monitor carbohydrates. Continue with low glucose diet. -Will continue to monitor.  Hyperlipidemia associated with type 2 diabetes mellitus: -Last lipid panel: Total cholesterol 147, triglycerides 146, HDL 61, LDL 61 at goal -Recommend to continue with a heart healthy diet low in saturated and transfats. -Plan to repeat lipid panel next visit.  Essential hypertension: -Ambulatory BP readings fluctuate and elevated likely related to checking BP before medications are taken. -Recommend to continue ambulatory BP monitoring and check BP 2 hours after taking antihypertensive medication. -Continue current medication regimen. -Recommend to stay well-hydrated and follow a low-sodium diet. -Will continue  to monitor.    - As part of my medical decision making, I reviewed the following data within the [place] History obtained from pt /family, CMA notes reviewed and incorporated if applicable, Labs reviewed, Radiograph/ tests reviewed if applicable and OV notes from prior OV's with me, as well  as any other specialists she/he has seen since seeing me last, were all reviewed and used in my medical decision making process today.    - Additionally, when appropriate, discussion had with patient regarding our treatment plan, and their biases/concerns about that plan were used in my medical decision making today.    - The patient agreed with the plan and demonstrated an understanding of the instructions.   No barriers to understanding were identified.     - The patient was advised to call back or seek an in-person evaluation if the symptoms worsen or if the condition fails to improve as anticipated.   Return in about 3 months (around 10/31/2020) for DM, HTN, HLD and FBW (cmp, lipid panel, a1c, cbc).    No orders of the defined types were placed in this encounter.   No orders of the defined types were placed in this encounter.   There are no discontinued medications.     Time spent on visit including pre-visit chart review and post-visit care was 15 minutes.      The [place] was signed into law in 2016 which includes the topic of electronic health records.  This provides immediate access to information in MyChart.  This includes consultation notes, operative notes, office notes, lab results and pathology reports.  If you have any questions about what you read please let us know at your next visit or call us at the office.  We are right here with you.  Note:  This note was prepared with assistance of Dragon voice recognition software. Occasional wrong-word or sound-a-like substitutions may have occurred due to the inherent limitations of voice recognition software.  __________________________________________________________________________________     Patient Care Team    Relationship Specialty Notifications Start End  [name], [place] PCP - General Physician Assistant  01/24/20   [name], [name], MD PCP - Cardiology Cardiology Admissions 09/08/18       -Vitals obtained; medications/ allergies reconciled;  personal medical, social, Sx etc.histories were updated by CMA, reviewed by me and are reflected in chart   Patient Active Problem List   Diagnosis Date Noted  . History of Helicobacter infection 10/06/2019  . Bleeding duodenal ulcer 10/06/2019  . Peptic ulcer disease 10/06/2019  . PAH (pulmonary artery hypertension) ([place]) 08/19/2019  . Symptomatic anemia 07/29/2019  . Mild renal insufficiency 07/29/2019  . Pressure injury of skin 07/29/2019  . Near syncope   . Melena   . Normocytic anemia 07/04/2019  . Heme positive stool 07/04/2019  . Other constipation 07/04/2019  . Low hemoglobin 06/15/2019  . Physical deconditioning 05/05/2019  . Healthcare maintenance 10/20/2018  . Other chronic pain 10/20/2018  . Rib fracture 07/02/2018  . Diabetes mellitus type 2, controlled, without complications ([place]) 46/50/3546  . Acute respiratory failure with hypoxia ([place]) 07/01/2018  . Pacemaker battery depletion 05/26/2018  . Mild obesity 01/05/2016  . Chronic respiratory failure with hypoxia ([place]) 10/29/2015  . Upper airway cough syndrome 03/04/2015  . COPD GOLD II 01/18/2015  . Pulmonary hypertension ([place])   . Dyspnea   . DOE (dyspnea on exertion)   . Sleep apnea   . Hypoxemia   . Acute respiratory  failure with hypoxemia ([place])   . Acute pulmonary edema (HCC)   . Chronic combined systolic and diastolic CHF (congestive heart failure) ([place]) 12/06/2014  . Accelerated hypertension with diastolic congestive heart failure, NYHA class 3 ([place]) 12/06/2014  . CHB (complete heart block) ([place]) 09/19/2013  . Pacemaker - dual chamber St Jude, 2009 09/19/2013  . Essential hypertension 09/19/2013  . Normal coronary arteries angiography 2002 09/19/2013  . Hyperlipidemia 09/19/2013  . Varicose veins of lower extremities with other complications 76/80/8811     Current Meds  Medication Sig  . acetaminophen (TYLENOL) 500 MG tablet Take 500 mg by  mouth every 6 (six) hours as needed for mild pain.   Marland Kitchen aspirin EC 81 MG tablet Take 81 mg by mouth daily.  . furosemide (LASIX) 40 MG tablet TAKE 1 TABLET DAILY. MAY TAKE AN EXTRA TABLET FOR WEIGHT GAIN OF 2 LB IN A DAY & 5 LBS IN A WEEK  . KLOR-CON M20 20 MEQ tablet Take 1 tablet (20 mEq total) by mouth daily.  Marland Kitchen losartan (COZAAR) 50 MG tablet Take 50 mg by mouth daily.  . Nutritional Supplements (JUICE PLUS FIBRE PO) Take 3 tablets by mouth at bedtime.  . OXYGEN 2lpm with sleep and exertion [employer]  . pantoprazole (PROTONIX) 40 MG tablet Take 1 tablet (40 mg total) by mouth daily.  . pravastatin (PRAVACHOL) 40 MG tablet Take 1 tablet (40 mg total) by mouth daily.  Marland Kitchen Respiratory Therapy Supplies (FLUTTER) DEVI Use flutter device three times a day as instructed     Allergies:  No Known Allergies   ROS:  See above HPI for pertinent positives and negatives   Objective:   Height 5\' 4"  (1.626 m), weight 143 lb (64.9 kg).  (if some vitals are omitted, this means that patient was UNABLE to obtain them.) General: A & O * 3; sounds in no acute distress  Respiratory: speaking in full sentences, no conversational dyspnea Psych: insight appears good, mood- appears full

## 2020-08-20 ENCOUNTER — Telehealth: Payer: Self-pay | Admitting: Physician Assistant

## 2020-08-20 NOTE — Telephone Encounter (Signed)
Please call daughter [name] in reference to obtaining a fecal test kit for her mom, [name]. [place]

## 2020-08-21 NOTE — Telephone Encounter (Signed)
Pt's daughter states that pt is having dark stools again.  Pt is also looking pale and is loosing weight.  [name] is requesting fecal cards to check for GI bleed again.  Please advise.  Charyl Bigger, CMA

## 2020-08-21 NOTE — Telephone Encounter (Signed)
Recommend to come in for blood work (cbc w/ diff and CMP) and for hemoccult cards.  Thank you, Herb Grays

## 2020-08-22 NOTE — Telephone Encounter (Signed)
Pt's daughter informed.  Ms. Arnoldo Morale expressed understanding and is agreeable.  Charyl Bigger, CMA

## 2020-08-28 ENCOUNTER — Other Ambulatory Visit: Payer: Self-pay

## 2020-08-28 ENCOUNTER — Other Ambulatory Visit: Payer: Medicare Other

## 2020-08-28 DIAGNOSIS — I10 Essential (primary) hypertension: Secondary | ICD-10-CM | POA: Diagnosis not present

## 2020-08-28 DIAGNOSIS — E119 Type 2 diabetes mellitus without complications: Secondary | ICD-10-CM

## 2020-08-28 NOTE — Progress Notes (Signed)
Lab only 

## 2020-08-29 ENCOUNTER — Telehealth: Payer: Self-pay | Admitting: Physician Assistant

## 2020-08-29 LAB — CMP14+EGFR
ALT: 13 IU/L (ref 0–32)
AST: 14 IU/L (ref 0–40)
Albumin/Globulin Ratio: 2.1 (ref 1.2–2.2)
Albumin: 4.4 g/dL (ref 3.6–4.6)
Alkaline Phosphatase: 56 IU/L (ref 44–121)
BUN/Creatinine Ratio: 32 — ABNORMAL HIGH (ref 12–28)
BUN: 22 mg/dL (ref 8–27)
Bilirubin Total: 0.5 mg/dL (ref 0.0–1.2)
CO2: 30 mmol/L — ABNORMAL HIGH (ref 20–29)
Calcium: 8.9 mg/dL (ref 8.7–10.3)
Chloride: 98 mmol/L (ref 96–106)
Creatinine, Ser: 0.69 mg/dL (ref 0.57–1.00)
GFR calc Af Amer: 94 mL/min/{1.73_m2} (ref >59)
GFR calc non Af Amer: 82 mL/min/{1.73_m2} (ref >59)
Globulin, Total: 2.1 g/dL (ref 1.5–4.5)
Glucose: 123 mg/dL — ABNORMAL HIGH (ref 65–99)
Potassium: 4 mmol/L (ref 3.5–5.2)
Sodium: 138 mmol/L (ref 134–144)
Total Protein: 6.5 g/dL (ref 6.0–8.5)

## 2020-08-29 NOTE — Telephone Encounter (Signed)
Spoke with [name] who is concerned because the CBC was not drawn when she came in for labs.   Unable to give lab results as they have not been reviewed by the provider.   [name] would like her moms CBC to be drawn. The order is in the computer but was not released with labs were collected.   Please contact [name] with options of when to get lab drawn. She would like to speak with Toniann Fail.

## 2020-09-03 ENCOUNTER — Other Ambulatory Visit: Payer: Medicare Other

## 2020-09-03 ENCOUNTER — Other Ambulatory Visit: Payer: Self-pay

## 2020-09-03 DIAGNOSIS — E119 Type 2 diabetes mellitus without complications: Secondary | ICD-10-CM

## 2020-09-03 DIAGNOSIS — E785 Hyperlipidemia, unspecified: Secondary | ICD-10-CM

## 2020-09-04 ENCOUNTER — Other Ambulatory Visit: Payer: Self-pay

## 2020-09-04 ENCOUNTER — Other Ambulatory Visit: Payer: Self-pay | Admitting: Cardiovascular Disease

## 2020-09-04 LAB — CBC WITH DIFFERENTIAL/PLATELET
Basophils Absolute: 0 10*3/uL (ref 0.0–0.2)
Basos: 0 %
EOS (ABSOLUTE): 0.1 10*3/uL (ref 0.0–0.4)
Eos: 2 %
Hematocrit: 41 % (ref 34.0–46.6)
Hemoglobin: 13.8 g/dL (ref 11.1–15.9)
Immature Grans (Abs): 0 10*3/uL (ref 0.0–0.1)
Immature Granulocytes: 0 %
Lymphocytes Absolute: 2.8 10*3/uL (ref 0.7–3.1)
Lymphs: 38 %
MCH: 31.2 pg (ref 26.6–33.0)
MCHC: 33.7 g/dL (ref 31.5–35.7)
MCV: 93 fL (ref 79–97)
Monocytes Absolute: 0.5 10*3/uL (ref 0.1–0.9)
Monocytes: 6 %
Neutrophils Absolute: 3.8 10*3/uL (ref 1.4–7.0)
Neutrophils: 54 %
Platelets: 160 10*3/uL (ref 150–450)
RBC: 4.43 x10E6/uL (ref 3.77–5.28)
RDW: 12.6 % (ref 11.7–15.4)
WBC: 7.2 10*3/uL (ref 3.4–10.8)

## 2020-09-04 MED ORDER — PRAVASTATIN SODIUM 40 MG PO TABS
40.0000 mg | ORAL_TABLET | Freq: Every day | ORAL | 3 refills | Status: DC
Start: 2020-09-04 — End: 2021-12-09

## 2020-09-05 ENCOUNTER — Ambulatory Visit (INDEPENDENT_AMBULATORY_CARE_PROVIDER_SITE_OTHER): Payer: Medicare Other

## 2020-09-05 DIAGNOSIS — I442 Atrioventricular block, complete: Secondary | ICD-10-CM | POA: Diagnosis not present

## 2020-09-05 LAB — CUP PACEART REMOTE DEVICE CHECK
Battery Remaining Longevity: 120 mo
Battery Remaining Percentage: 95.5 %
Battery Voltage: 2.99 V
Brady Statistic AP VP Percent: 38 %
Brady Statistic AP VS Percent: 1 %
Brady Statistic AS VP Percent: 61 %
Brady Statistic AS VS Percent: 1 %
Brady Statistic RA Percent Paced: 37 %
Brady Statistic RV Percent Paced: 99 %
Date Time Interrogation Session: 20220105034217
Implantable Lead Implant Date: 20090401
Implantable Lead Implant Date: 20090401
Implantable Lead Location: 753859
Implantable Lead Location: 753860
Implantable Pulse Generator Implant Date: 20190925
Lead Channel Impedance Value: 430 Ohm
Lead Channel Impedance Value: 440 Ohm
Lead Channel Pacing Threshold Amplitude: 0.75 V
Lead Channel Pacing Threshold Amplitude: 0.75 V
Lead Channel Pacing Threshold Pulse Width: 0.5 ms
Lead Channel Pacing Threshold Pulse Width: 0.5 ms
Lead Channel Sensing Intrinsic Amplitude: 12 mV
Lead Channel Sensing Intrinsic Amplitude: 2.1 mV
Lead Channel Setting Pacing Amplitude: 1 V
Lead Channel Setting Pacing Amplitude: 2 V
Lead Channel Setting Pacing Pulse Width: 0.5 ms
Lead Channel Setting Sensing Sensitivity: 4 mV
Pulse Gen Model: 2272
Pulse Gen Serial Number: 9064942

## 2020-09-06 ENCOUNTER — Other Ambulatory Visit: Payer: Self-pay

## 2020-09-06 MED ORDER — PANTOPRAZOLE SODIUM 40 MG PO TBEC: 40.0000 mg | DELAYED_RELEASE_TABLET | Freq: Every day | ORAL | 3 refills | Status: DC

## 2020-09-19 NOTE — Progress Notes (Signed)
Remote pacemaker transmission.   

## 2020-10-02 ENCOUNTER — Other Ambulatory Visit: Payer: Self-pay | Admitting: Cardiovascular Disease

## 2020-12-05 ENCOUNTER — Ambulatory Visit (INDEPENDENT_AMBULATORY_CARE_PROVIDER_SITE_OTHER): Payer: Medicare Other

## 2020-12-05 DIAGNOSIS — I442 Atrioventricular block, complete: Secondary | ICD-10-CM

## 2020-12-06 LAB — CUP PACEART REMOTE DEVICE CHECK
Battery Remaining Longevity: 119 mo
Battery Remaining Percentage: 95.5 %
Battery Voltage: 2.99 V
Brady Statistic AP VP Percent: 35 %
Brady Statistic AP VS Percent: 1 %
Brady Statistic AS VP Percent: 65 %
Brady Statistic AS VS Percent: 1 %
Brady Statistic RA Percent Paced: 34 %
Brady Statistic RV Percent Paced: 99 %
Date Time Interrogation Session: 20220406031952
Implantable Lead Implant Date: 20090401
Implantable Lead Implant Date: 20090401
Implantable Lead Location: 753859
Implantable Lead Location: 753860
Implantable Pulse Generator Implant Date: 20190925
Lead Channel Impedance Value: 430 Ohm
Lead Channel Impedance Value: 430 Ohm
Lead Channel Pacing Threshold Amplitude: 0.75 V
Lead Channel Pacing Threshold Amplitude: 0.875 V
Lead Channel Pacing Threshold Pulse Width: 0.5 ms
Lead Channel Pacing Threshold Pulse Width: 0.5 ms
Lead Channel Sensing Intrinsic Amplitude: 12 mV
Lead Channel Sensing Intrinsic Amplitude: 2.2 mV
Lead Channel Setting Pacing Amplitude: 1.125
Lead Channel Setting Pacing Amplitude: 2 V
Lead Channel Setting Pacing Pulse Width: 0.5 ms
Lead Channel Setting Sensing Sensitivity: 4 mV
Pulse Gen Model: 2272
Pulse Gen Serial Number: 9064942

## 2020-12-13 DIAGNOSIS — Z23 Encounter for immunization: Secondary | ICD-10-CM | POA: Diagnosis not present

## 2020-12-15 ENCOUNTER — Telehealth: Payer: Self-pay | Admitting: Cardiology

## 2020-12-15 NOTE — Telephone Encounter (Signed)
Patient calling stating that her weight went up by 3.5lbs (146.8lbs >>had weighed 145.9 lbs on Monday and Tuesday, 143.7lbs on Wed) on Thursday 4/14 and she took 80mg  of Lasix total.  Weight came down 1.5lb on Friday and but now her weight is up to 147.3lbs.  She went out to eat at [employer] on Friday. She has has no LE edema and O2 sats are 94%.  She has already taken Lasix 40mg  this am and I encouraged her to to continue on lasix 40mg  BID until MOnday and call Dr. Victorino December office for further instructions.

## 2020-12-17 NOTE — Progress Notes (Signed)
Remote pacemaker transmission.   

## 2020-12-17 NOTE — Telephone Encounter (Signed)
Agree with Dr. Theodosia Blender - let's follow up and make sure she is back to baseline.

## 2020-12-17 NOTE — Telephone Encounter (Signed)
Attempted to reach the patient. Was unable to leave a message 

## 2020-12-20 NOTE — Telephone Encounter (Signed)
Attempted to reach the patient. Was unable to leave a message 

## 2020-12-21 ENCOUNTER — Encounter: Payer: Self-pay | Admitting: *Deleted

## 2020-12-21 NOTE — Telephone Encounter (Signed)
Attempted to reach the patient. Was unable to leave a message.  MyChart message sent

## 2020-12-27 ENCOUNTER — Other Ambulatory Visit: Payer: Self-pay | Admitting: Physician Assistant

## 2020-12-27 DIAGNOSIS — E119 Type 2 diabetes mellitus without complications: Secondary | ICD-10-CM

## 2020-12-27 DIAGNOSIS — E1169 Type 2 diabetes mellitus with other specified complication: Secondary | ICD-10-CM

## 2020-12-27 DIAGNOSIS — Z0000 Encounter for general adult medical examination without abnormal findings: Secondary | ICD-10-CM

## 2020-12-27 DIAGNOSIS — I10 Essential (primary) hypertension: Secondary | ICD-10-CM

## 2020-12-27 DIAGNOSIS — D500 Iron deficiency anemia secondary to blood loss (chronic): Secondary | ICD-10-CM

## 2020-12-31 ENCOUNTER — Other Ambulatory Visit: Payer: Medicare Other

## 2020-12-31 ENCOUNTER — Other Ambulatory Visit: Payer: Self-pay

## 2020-12-31 DIAGNOSIS — E119 Type 2 diabetes mellitus without complications: Secondary | ICD-10-CM

## 2020-12-31 DIAGNOSIS — E1169 Type 2 diabetes mellitus with other specified complication: Secondary | ICD-10-CM

## 2020-12-31 DIAGNOSIS — D500 Iron deficiency anemia secondary to blood loss (chronic): Secondary | ICD-10-CM

## 2020-12-31 DIAGNOSIS — I10 Essential (primary) hypertension: Secondary | ICD-10-CM

## 2020-12-31 DIAGNOSIS — E785 Hyperlipidemia, unspecified: Secondary | ICD-10-CM

## 2020-12-31 DIAGNOSIS — Z0000 Encounter for general adult medical examination without abnormal findings: Secondary | ICD-10-CM

## 2021-01-01 LAB — LIPID PANEL
Chol/HDL Ratio: 2.6 ratio (ref 0.0–4.4)
Cholesterol, Total: 180 mg/dL (ref 100–199)
HDL: 68 mg/dL (ref >39)
LDL Chol Calc (NIH): 93 mg/dL (ref 0–99)
Triglycerides: 105 mg/dL (ref 0–149)
VLDL Cholesterol Cal: 19 mg/dL (ref 5–40)

## 2021-01-01 LAB — CBC
Hematocrit: 41 % (ref 34.0–46.6)
Hemoglobin: 13.5 g/dL (ref 11.1–15.9)
MCH: 30.8 pg (ref 26.6–33.0)
MCHC: 32.9 g/dL (ref 31.5–35.7)
MCV: 93 fL (ref 79–97)
Platelets: 150 10*3/uL (ref 150–450)
RBC: 4.39 x10E6/uL (ref 3.77–5.28)
RDW: 11.9 % (ref 11.7–15.4)
WBC: 7.1 10*3/uL (ref 3.4–10.8)

## 2021-01-01 LAB — COMPREHENSIVE METABOLIC PANEL
ALT: 9 IU/L (ref 0–32)
AST: 17 IU/L (ref 0–40)
Albumin/Globulin Ratio: 2.1 (ref 1.2–2.2)
Albumin: 4.7 g/dL — ABNORMAL HIGH (ref 3.6–4.6)
Alkaline Phosphatase: 60 IU/L (ref 44–121)
BUN/Creatinine Ratio: 39 — ABNORMAL HIGH (ref 12–28)
BUN: 24 mg/dL (ref 8–27)
Bilirubin Total: 0.5 mg/dL (ref 0.0–1.2)
CO2: 28 mmol/L (ref 20–29)
Calcium: 9 mg/dL (ref 8.7–10.3)
Chloride: 101 mmol/L (ref 96–106)
Creatinine, Ser: 0.62 mg/dL (ref 0.57–1.00)
Globulin, Total: 2.2 g/dL (ref 1.5–4.5)
Glucose: 107 mg/dL — ABNORMAL HIGH (ref 65–99)
Potassium: 3.9 mmol/L (ref 3.5–5.2)
Sodium: 142 mmol/L (ref 134–144)
Total Protein: 6.9 g/dL (ref 6.0–8.5)
eGFR: 89 mL/min/{1.73_m2} (ref >59)

## 2021-01-01 LAB — HEMOGLOBIN A1C
Est. average glucose Bld gHb Est-mCnc: 128 mg/dL
Hgb A1c MFr Bld: 6.1 % — ABNORMAL HIGH (ref 4.8–5.6)

## 2021-01-03 ENCOUNTER — Ambulatory Visit: Payer: Medicare Other | Admitting: Physician Assistant

## 2021-01-16 ENCOUNTER — Ambulatory Visit (INDEPENDENT_AMBULATORY_CARE_PROVIDER_SITE_OTHER): Payer: Medicare Other | Admitting: Physician Assistant

## 2021-01-16 ENCOUNTER — Encounter: Payer: Self-pay | Admitting: Physician Assistant

## 2021-01-16 VITALS — Ht 62.0 in | Wt 147.7 lb

## 2021-01-16 DIAGNOSIS — E1159 Type 2 diabetes mellitus with other circulatory complications: Secondary | ICD-10-CM | POA: Diagnosis not present

## 2021-01-16 DIAGNOSIS — I152 Hypertension secondary to endocrine disorders: Secondary | ICD-10-CM | POA: Diagnosis not present

## 2021-01-16 DIAGNOSIS — E785 Hyperlipidemia, unspecified: Secondary | ICD-10-CM

## 2021-01-16 DIAGNOSIS — E119 Type 2 diabetes mellitus without complications: Secondary | ICD-10-CM

## 2021-01-16 DIAGNOSIS — D500 Iron deficiency anemia secondary to blood loss (chronic): Secondary | ICD-10-CM

## 2021-01-16 DIAGNOSIS — I5042 Chronic combined systolic (congestive) and diastolic (congestive) heart failure: Secondary | ICD-10-CM

## 2021-01-16 DIAGNOSIS — E1169 Type 2 diabetes mellitus with other specified complication: Secondary | ICD-10-CM | POA: Diagnosis not present

## 2021-01-16 NOTE — Patient Instructions (Addendum)
Diabetes Mellitus Action Plan Following a diabetes action plan is a way for you to manage your diabetes (diabetes mellitus) symptoms. The plan is color-coded to help you understand what actions you need to take based on any symptoms you are having.  If you have symptoms in the red zone, you need medical care right away.  If you have symptoms in the yellow zone, you are having problems.  If you have symptoms in the green zone, you are doing well. Learning about and understanding diabetes can take time. Follow the plan that you develop with your health care provider. Know the target range for your blood sugar (glucose) level, and review your treatment plan with your health care provider at each visit. The target range for my blood sugar level is __________________________ mg/dL. Red zone Get medical help right away if you have any of the following symptoms:  A blood sugar test result that is below 54 mg/dL (3 mmol/L).  A blood sugar test result that is at or above 240 mg/dL (13.3 mmol/L) for 2 days in a row.  Confusion or trouble thinking clearly.  Difficulty breathing.  Sickness or a fever for 2 or more days that is not getting better.  Moderate or large ketone levels in your urine.  Feeling tired or having no energy. If you have any red zone symptoms, do not wait to see if the symptoms will go away. Get medical help right away. Call your local emergency services (911 in the [place] Do not drive yourself to the hospital. If you have severely low blood sugar (severe hypoglycemia) and you cannot eat or drink, you may need glucagon. Make sure a family member or close friend knows how to check your blood sugar and how to give you glucagon. You may need to be treated in a hospital for this condition.   Yellow zone If you have any of the following symptoms, your diabetes is not under control and you may need to make some changes:  A blood sugar test result that is at or above 240 mg/dL (13.3  mmol/L) for 2 days in a row.  Blood sugar test results that are below 70 mg/dL (3.9 mmol/L).  Other symptoms of hypoglycemia, such as: ? Shaking or feeling light-headed. ? Confusion or irritability. ? Feeling hungry. ? Having a fast heartbeat. If you have any yellow zone symptoms:  Treat your hypoglycemia by eating or drinking 15 grams of a rapid-acting carbohydrate. Follow the 15:15 rule: ? Take 15 grams of a rapid-acting carbohydrate, such as:  1 tube of glucose gel.  4 glucose pills.  4 oz (120 mL) of fruit juice.  4 oz (120 mL) of regular (not diet) soda. ? Check your blood sugar 15 minutes after you take the carbohydrate. ? If the repeat blood sugar test is still at or below 70 mg/dL (3.9 mmol/L), take 15 grams of a carbohydrate again. ? If your blood sugar does not increase above 70 mg/dL (3.9 mmol/L) after 3 tries, get medical help right away. ? After your blood sugar returns to normal, eat a meal or a snack within 1 hour.  Keep taking your daily medicines as told by your health care provider.  Check your blood sugar more often than you normally would. ? Write down your results. ? Call your health care provider if you have trouble keeping your blood sugar in your target range.   Green zone These signs mean you are doing well and you can continue what you  are doing to manage your diabetes:  Your blood sugar is within your personal target range. For most people, a blood sugar level before a meal (preprandial) should be 80-130 mg/dL (4.4-7.2 mmol/L).  You feel well, and you are able to do daily activities. If you are in the green zone, continue to manage your diabetes as told by your health care provider. To do this:  Eat a healthy diet.  Exercise regularly.  Check your blood sugar as told by your health care provider.  Take your medicines as told by your health care provider.   Where to find more information  American Diabetes Association (ADA):  diabetes.org  Association of Diabetes Care & Education Specialists (ADCES): diabeteseducator.org Summary  Following a diabetes action plan is a way for you to manage your diabetes symptoms. The plan is color-coded to help you understand what actions you need to take based on any symptoms you are having.  Follow the plan that you develop with your health care provider. Make sure you know your personal target blood sugar level.  Review your treatment plan with your health care provider at each visit. This information is not intended to replace advice given to you by your health care provider. Make sure you discuss any questions you have with your health care provider. Document Revised: 02/23/2020 Document Reviewed: 02/23/2020 Elsevier Patient Education  2021 [place] Heart-healthy meal planning includes:  Eating less unhealthy fats.  Eating more healthy fats.  Making other changes in your diet. Talk with your doctor or a diet specialist (dietitian) to create an eating plan that is right for you. What is my plan? Your doctor may recommend an eating plan that includes:  Total fat: ______% or less of total calories a day.  Saturated fat: ______% or less of total calories a day.  Cholesterol: less than _________mg a day. What are tips for following this plan? Cooking Avoid frying your food. Try to bake, boil, grill, or broil it instead. You can also reduce fat by:  Removing the skin from poultry.  Removing all visible fats from meats.  Steaming vegetables in water or broth. Meal planning  At meals, divide your plate into four equal parts: ? Fill one-half of your plate with vegetables and green salads. ? Fill one-fourth of your plate with whole grains. ? Fill one-fourth of your plate with lean protein foods.  Eat 4-5 servings of vegetables per day. A serving of vegetables is: ? 1 cup of raw or cooked vegetables. ? 2 cups of raw leafy  greens.  Eat 4-5 servings of fruit per day. A serving of fruit is: ? 1 medium whole fruit. ?  cup of dried fruit. ?  cup of fresh, frozen, or canned fruit. ?  cup of 100% fruit juice.  Eat more foods that have soluble fiber. These are apples, broccoli, carrots, beans, peas, and barley. Try to get 20-30 g of fiber per day.  Eat 4-5 servings of nuts, legumes, and seeds per week: ? 1 serving of dried beans or legumes equals  cup after being cooked. ? 1 serving of nuts is  cup. ? 1 serving of seeds equals 1 tablespoon.   General information  Eat more home-cooked food. Eat less restaurant, buffet, and fast food.  Limit or avoid alcohol.  Limit foods that are high in starch and sugar.  Avoid fried foods.  Lose weight if you are overweight.  Keep track of how much salt (sodium) you eat.  This is important if you have high blood pressure. Ask your doctor to tell you more about this.  Try to add vegetarian meals each week. Fats  Choose healthy fats. These include olive oil and canola oil, flaxseeds, walnuts, almonds, and seeds.  Eat more omega-3 fats. These include salmon, mackerel, sardines, tuna, flaxseed oil, and ground flaxseeds. Try to eat fish at least 2 times each week.  Check food labels. Avoid foods with trans fats or high amounts of saturated fat.  Limit saturated fats. ? These are often found in animal products, such as meats, butter, and cream. ? These are also found in plant foods, such as palm oil, palm kernel oil, and coconut oil.  Avoid foods with partially hydrogenated oils in them. These have trans fats. Examples are stick margarine, some tub margarines, cookies, crackers, and other baked goods. What foods can I eat? Fruits All fresh, canned (in natural juice), or frozen fruits. Vegetables Fresh or frozen vegetables (raw, steamed, roasted, or grilled). Green salads. Grains Most grains. Choose whole wheat and whole grains most of the time. Rice and pasta,  including brown rice and pastas made with whole wheat. Meats and other proteins Lean, well-trimmed beef, veal, pork, and lamb. Chicken and [place] without skin. All fish and shellfish. Wild duck, rabbit, pheasant, and venison. Egg whites or low-cholesterol egg substitutes. Dried beans, peas, lentils, and tofu. Seeds and most nuts. Dairy Low-fat or nonfat cheeses, including ricotta and mozzarella. Skim or 1% milk that is liquid, powdered, or evaporated. Buttermilk that is made with low-fat milk. Nonfat or low-fat yogurt. Fats and oils Non-hydrogenated (trans-free) margarines. Vegetable oils, including soybean, sesame, sunflower, olive, peanut, safflower, corn, canola, and cottonseed. Salad dressings or mayonnaise made with a vegetable oil. Beverages Mineral water. Coffee and tea. Diet carbonated beverages. Sweets and desserts Sherbet, gelatin, and fruit ice. Small amounts of dark chocolate. Limit all sweets and desserts. Seasonings and condiments All seasonings and condiments. The items listed above may not be a complete list of foods and drinks you can eat. Contact a dietitian for more options. What foods should I avoid? Fruits Canned fruit in heavy syrup. Fruit in cream or butter sauce. Fried fruit. Limit coconut. Vegetables Vegetables cooked in cheese, cream, or butter sauce. Fried vegetables. Grains Breads that are made with saturated or trans fats, oils, or whole milk. Croissants. Sweet rolls. Donuts. High-fat crackers, such as cheese crackers. Meats and other proteins Fatty meats, such as hot dogs, ribs, sausage, bacon, rib-eye roast or steak. High-fat deli meats, such as salami and bologna. Caviar. Domestic duck and goose. Organ meats, such as liver. Dairy Cream, sour cream, cream cheese, and creamed cottage cheese. Whole-milk cheeses. Whole or 2% milk that is liquid, evaporated, or condensed. Whole buttermilk. Cream sauce or high-fat cheese sauce. Yogurt that is made from whole  milk. Fats and oils Meat fat, or shortening. Cocoa butter, hydrogenated oils, palm oil, coconut oil, palm kernel oil. Solid fats and shortenings, including bacon fat, salt pork, lard, and butter. Nondairy cream substitutes. Salad dressings with cheese or sour cream. Beverages Regular sodas and juice drinks with added sugar. Sweets and desserts Frosting. Pudding. Cookies. Cakes. Pies. Milk chocolate or white chocolate. Buttered syrups. Full-fat ice cream or ice cream drinks. The items listed above may not be a complete list of foods and drinks to avoid. Contact a dietitian for more information. Summary  Heart-healthy meal planning includes eating less unhealthy fats, eating more healthy fats, and making other changes in your diet.  Eat a balanced diet. This includes fruits and vegetables, low-fat or nonfat dairy, lean protein, nuts and legumes, whole grains, and heart-healthy oils and fats. This information is not intended to replace advice given to you by your health care provider. Make sure you discuss any questions you have with your health care provider. Document Revised: 10/22/2017 Document Reviewed: 09/25/2017 Elsevier Patient Education  2021 [employer].

## 2021-01-16 NOTE — Progress Notes (Signed)
     Telehealth office visit note for Tammy Abonza, PA-C- at [facility]   I connected with current patient today by telephone and verified that I am speaking with the correct person   . Location of the patient: Home . Location of the provider: Office - This visit type was conducted due to national recommendations for restrictions regarding the COVID-19 Pandemic (e.g. social distancing) in an effort to limit this patient's exposure and mitigate transmission in our community.    - No physical exam could be performed with this format, beyond that communicated to us by the patient/ family members as noted.   - Additionally my office staff/ schedulers were to discuss with the patient that there may be a monetary charge related to this service, depending on their medical insurance.  My understanding is that patient understood and consented to proceed.     _________________________________________________________________________________   History of Present Illness: Patient calls in to follow-up on diabetes mellitus, hypertension and hyperlipidemia.  Patient's daughter [name] is on the line as well.  Diabetes: Pt denies increased urination or thirst. Pt continues to monitor her carbohydrate and glucose intake. No hypoglycemic events. Checking glucose at home. Fasting blood sugars average in the 130s  HTN: Pt denies chest pain, palpitations, dizziness or lower extremity swelling.  Patient is taking furosemide as needed for fluid retention which she usually notices with weight changes.  Patient reports currently not taking losartan which was discontinued by cardiology.  Checks BP at home usually first thing in the morning. BP readings range from 150-168/62-79.  Patient's daughter  states sometimes rechecks blood pressure in the afternoons which seems to be better than the morning.    HLD: Pt's daughter states patient has been eating out more such as [employer] or [employer] and  drinks a glass of milk daily.    No flowsheet data found.  Depression screen PHQ 2/9 04/27/2020 01/24/2020 06/15/2019 12/06/2018 10/20/2018  Decreased Interest 0 0 0 0 0  Down, Depressed, Hopeless 0 0 0 0 0  PHQ - 2 Score 0 0 0 0 0  Altered sleeping 0 0 3 0 1  Tired, decreased energy 0 0 1 0 1  Change in appetite 0 0 0 0 0  Feeling bad or failure about yourself  0 0 0 0 0  Trouble concentrating 0 0 - 0 0  Moving slowly or fidgety/restless 0 0 0 0 0  Suicidal thoughts 0 0 0 0 0  PHQ-9 Score 0 0 4 0 2  Difficult doing work/chores - - Not difficult at all - Not difficult at all      Impression and Recommendations:     -BP log x 2 weeks and drop off log to office  1. Controlled type 2 diabetes mellitus without complication, unspecified whether long term insulin use (HCC)   2. Hypertension associated with diabetes (HCC)   3. Hyperlipidemia associated with type 2 diabetes mellitus (HCC)   4. Chronic combined systolic and diastolic CHF (congestive heart failure) (HCC)   5. Iron deficiency anemia due to chronic blood loss     Controlled type 2 diabetes mellitus without complication, unspecified whether long term insulin use: -Recent A1c 6.1, well controlled with diet. -Continue ambulatory glucose monitoring. -Will continue to monitor.  Hypertension associated with diabetes: -Per patient and daughter no longer taking losartan which is still listed on medication list.  Recommend to check BP twice daily for 2 weeks and keep a log   to forward to the office. If BP consistently > 140/90 recommend resuming ARB or ACEi therapy especially with coexisting diabetes mellitus for renal protection.  Recent CMP, creatinine 0.62 EGFR 89 -Follow low-sodium diet. -Will continue to monitor.  Hyperlipidemia associated with type 2 diabetes mellitus: -Discussed recent lipid panel, total cholesterol 180, triglycerides 105, HDL 68, LDL 93 (goal <70).  Last LDL was at goal.  Recommend to reduce eating out,  fried foods and whole dairy products.  Will continue to manage with diet and lifestyle changes. -Will repeat lipid panel at follow-up visit and if LDL continues to be above goal recommend resuming statin therapy.  Patient and daughter verbalized understanding.  Chronic combined systolic and diastolic CHF: -Followed by cardiology. -Recommend to monitor fluid intake and continue daily weight checks. -Continue diuretic therapy and potassium supplement.  Recent potassium 3.9.  Iron deficiency anemia due to chronic blood loss: -Discussed recent CBC: Hemoglobin 13.5, hematocrit 41.0 -Will continue to monitor.   - As part of my medical decision making, I reviewed the following data within the electronic medical record:  History obtained from pt /family, CMA notes reviewed and incorporated if applicable, Labs reviewed, Radiograph/ tests reviewed if applicable and OV notes from prior OV's with me, as well as any other specialists she/he has seen since seeing me last, were all reviewed and used in my medical decision making process today.    - Additionally, when appropriate, discussion had with patient regarding our treatment plan, and their biases/concerns about that plan were used in my medical decision making today.    - The patient agreed with the plan and demonstrated an understanding of the instructions.   No barriers to understanding were identified.     - The patient was advised to call back or seek an in-person evaluation if the symptoms worsen or if the condition fails to improve as anticipated.   Return in about 3 months (around 04/18/2021) for DM, HTN, HLD and FBW (lipid panel, cmp, a1c).    No orders of the defined types were placed in this encounter.   No orders of the defined types were placed in this encounter.   There are no discontinued medications.     Time spent on telephone encounter was 24 minutes.    Note:  This note was prepared with assistance of Dragon voice  recognition software. Occasional wrong-word or sound-a-like substitutions may have occurred due to the inherent limitations of voice recognition software.   The 21st Century Cures Act was signed into law in 2016 which includes the topic of electronic health records.  This provides immediate access to information in MyChart.  This includes consultation notes, operative notes, office notes, lab results and pathology reports.  If you have any questions about what you read please let us know at your next visit or call us at the office.  We are right here with you.   __________________________________________________________________________________     Patient Care Team    Relationship Specialty Notifications Start End  Vincent, Maritza, PA-C PCP - General Physician Assistant  01/24/20   [name], Mihai, MD PCP - Cardiology Cardiology Admissions 09/08/18      -Vitals obtained; medications/ allergies reconciled;  personal medical, social, Sx etc.histories were updated by CMA, reviewed by me and are reflected in chart   Patient Active Problem List   Diagnosis Date Noted  . History of Helicobacter infection 10/06/2019  . Bleeding duodenal ulcer 10/06/2019  . Peptic ulcer disease 10/06/2019  . PAH (pulmonary artery hypertension) (  [place]) 08/19/2019  . Symptomatic anemia 07/29/2019  . Mild renal insufficiency 07/29/2019  . Pressure injury of skin 07/29/2019  . Near syncope   . Melena   . Normocytic anemia 07/04/2019  . Heme positive stool 07/04/2019  . Other constipation 07/04/2019  . Low hemoglobin 06/15/2019  . Physical deconditioning 05/05/2019  . Healthcare maintenance 10/20/2018  . Other chronic pain 10/20/2018  . Rib fracture 07/02/2018  . Diabetes mellitus type 2, controlled, without complications ([place]) 25/36/6440  . Acute respiratory failure with hypoxia ([place]) 07/01/2018  . Pacemaker battery depletion 05/26/2018  . Mild obesity 01/05/2016  . Chronic respiratory failure with hypoxia  ([place]) 10/29/2015  . Upper airway cough syndrome 03/04/2015  . COPD GOLD II 01/18/2015  . Pulmonary hypertension ([place])   . Dyspnea   . DOE (dyspnea on exertion)   . Sleep apnea   . Hypoxemia   . Acute respiratory failure with hypoxemia ([place])   . Acute pulmonary edema (HCC)   . Chronic combined systolic and diastolic CHF (congestive heart failure) ([place]) 12/06/2014  . Accelerated hypertension with diastolic congestive heart failure, NYHA class 3 ([place]) 12/06/2014  . CHB (complete heart block) ([place]) 09/19/2013  . Pacemaker - dual chamber St Jude, 2009 09/19/2013  . Essential hypertension 09/19/2013  . Normal coronary arteries angiography 2002 09/19/2013  . Hyperlipidemia 09/19/2013  . Varicose veins of lower extremities with other complications 34/74/2595     Current Meds  Medication Sig  . acetaminophen (TYLENOL) 500 MG tablet Take 500 mg by mouth every 6 (six) hours as needed for mild pain.   Marland Kitchen aspirin EC 81 MG tablet Take 81 mg by mouth daily.  . furosemide (LASIX) 40 MG tablet TAKE 1 TABLET DAILY. MAY TAKE AN EXTRA TABLET FOR WEIGHT GAIN OF 2 LB IN A DAY & 5 LBS IN A WEEK  . KLOR-CON M20 20 MEQ tablet Take 1 tablet (20 mEq total) by mouth daily.  Marland Kitchen losartan (COZAAR) 50 MG tablet Take 50 mg by mouth daily.  . Nutritional Supplements (JUICE PLUS FIBRE PO) Take 3 tablets by mouth at bedtime.  . OXYGEN 2lpm with sleep and exertion [employer]  . pantoprazole (PROTONIX) 40 MG tablet Take 1 tablet (40 mg total) by mouth daily.  . pravastatin (PRAVACHOL) 40 MG tablet Take 1 tablet (40 mg total) by mouth daily.  Marland Kitchen Respiratory Therapy Supplies (FLUTTER) DEVI Use flutter device three times a day as instructed     Allergies:  No Known Allergies   ROS:  See above HPI for pertinent positives and negatives   Objective:   Height 5' 2" (1.575 m), weight 147 lb 11.2 oz (67 kg).  (if some vitals are omitted, this means that patient was UNABLE to obtain them. ) General: A & O * 3; sounds in  no acute distress Respiratory: speaking in full sentences, no conversational dyspnea Psych: insight appears good, mood- appears full

## 2021-03-06 ENCOUNTER — Ambulatory Visit (INDEPENDENT_AMBULATORY_CARE_PROVIDER_SITE_OTHER): Payer: Medicare Other

## 2021-03-06 DIAGNOSIS — I442 Atrioventricular block, complete: Secondary | ICD-10-CM

## 2021-03-07 LAB — CUP PACEART REMOTE DEVICE CHECK
Battery Remaining Longevity: 87 mo
Battery Remaining Percentage: 74 %
Battery Voltage: 2.99 V
Brady Statistic AP VP Percent: 33 %
Brady Statistic AP VS Percent: 1 %
Brady Statistic AS VP Percent: 66 %
Brady Statistic AS VS Percent: 1 %
Brady Statistic RA Percent Paced: 32 %
Brady Statistic RV Percent Paced: 99 %
Date Time Interrogation Session: 20220706020015
Implantable Lead Implant Date: 20090401
Implantable Lead Implant Date: 20090401
Implantable Lead Location: 753859
Implantable Lead Location: 753860
Implantable Pulse Generator Implant Date: 20190925
Lead Channel Impedance Value: 430 Ohm
Lead Channel Impedance Value: 430 Ohm
Lead Channel Pacing Threshold Amplitude: 0.75 V
Lead Channel Pacing Threshold Amplitude: 0.75 V
Lead Channel Pacing Threshold Pulse Width: 0.5 ms
Lead Channel Pacing Threshold Pulse Width: 0.5 ms
Lead Channel Sensing Intrinsic Amplitude: 12 mV
Lead Channel Sensing Intrinsic Amplitude: 2.2 mV
Lead Channel Setting Pacing Amplitude: 1 V
Lead Channel Setting Pacing Amplitude: 2 V
Lead Channel Setting Pacing Pulse Width: 0.5 ms
Lead Channel Setting Sensing Sensitivity: 4 mV
Pulse Gen Model: 2272
Pulse Gen Serial Number: 9064942

## 2021-03-08 ENCOUNTER — Telehealth: Payer: Self-pay | Admitting: Gastroenterology

## 2021-03-08 NOTE — Telephone Encounter (Signed)
Patient calling wants to know if she can stop taking meds Protonix/Pravachol. Pt states she no longer has GERD sxs.. Plz advise  thank you

## 2021-03-08 NOTE — Telephone Encounter (Signed)
The pt has been advised that she can stop protonix if she does not feel she needs it but she should be aware that her symptoms may return.  She was also advised that pravachol is prescribed by her PCP and should speak with them in regards to that.  The pt has been advised of the information and verbalized understanding.

## 2021-03-27 NOTE — Progress Notes (Signed)
Remote pacemaker transmission.   

## 2021-05-06 DIAGNOSIS — Z20822 Contact with and (suspected) exposure to covid-19: Secondary | ICD-10-CM | POA: Diagnosis not present

## 2021-05-13 ENCOUNTER — Telehealth: Payer: Self-pay | Admitting: Physician Assistant

## 2021-05-13 DIAGNOSIS — R29898 Other symptoms and signs involving the musculoskeletal system: Secondary | ICD-10-CM

## 2021-05-13 NOTE — Telephone Encounter (Signed)
Pt requesting PT referral for leg weakness.

## 2021-05-18 ENCOUNTER — Telehealth: Payer: Self-pay

## 2021-05-18 NOTE — Telephone Encounter (Signed)
Called to schedule AWV with pt daughter. No answer and could not leave voice mail to advise of call back need.   Blima Singer RMA

## 2021-05-25 DIAGNOSIS — Z23 Encounter for immunization: Secondary | ICD-10-CM | POA: Diagnosis not present

## 2021-05-27 ENCOUNTER — Encounter: Payer: Self-pay | Admitting: Physical Therapy

## 2021-05-27 ENCOUNTER — Ambulatory Visit: Payer: Medicare Other | Attending: Physician Assistant | Admitting: Physical Therapy

## 2021-05-27 ENCOUNTER — Other Ambulatory Visit: Payer: Self-pay

## 2021-05-27 DIAGNOSIS — R2689 Other abnormalities of gait and mobility: Secondary | ICD-10-CM | POA: Diagnosis not present

## 2021-05-27 DIAGNOSIS — M79605 Pain in left leg: Secondary | ICD-10-CM | POA: Diagnosis not present

## 2021-05-27 DIAGNOSIS — G8929 Other chronic pain: Secondary | ICD-10-CM | POA: Diagnosis not present

## 2021-05-27 DIAGNOSIS — M6281 Muscle weakness (generalized): Secondary | ICD-10-CM | POA: Diagnosis not present

## 2021-05-27 DIAGNOSIS — M25562 Pain in left knee: Secondary | ICD-10-CM | POA: Insufficient documentation

## 2021-05-27 NOTE — Therapy (Addendum)
[place], [place], [address] Phone: [phone]   Fax:  [phone]  Physical Therapy Evaluation / Discharge  Patient Details  Name: [name] MRN: [id number] Date of Birth: August 20, 1941 Referring Provider (PT): Lorrene Reid, [place]   Encounter Date: 05/27/2021   PT End of Session - 05/27/21 0926     Visit Number 1    Number of Visits 8    Date for PT Re-Evaluation 07/22/21    Authorization Type MCR    Authorization Time Period FOTO by 6th, KX by 15th    Progress Note Due on Visit 10    PT Start Time 0915    PT Stop Time 1000    PT Time Calculation (min) 45 min    Equipment Utilized During Treatment Gait belt    Activity Tolerance Patient tolerated treatment well    Behavior During Therapy [facility] for tasks assessed/performed             Past Medical History:  Diagnosis Date   Arthritis    "hands, spine" (12/07/2014)   CHB (complete heart block) ([place]) 09/19/2013   CHF (congestive heart failure) (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"   Shortness of breath    exertional   Type II diabetes mellitus ([place])    "had it years ago; lost weight after neck OR in 2007; glucose went way down; recently dx'd as returned recently" (12/07/2014)   Urinary incontinence    "since 2007's OR"   Venous insufficiency (chronic) (peripheral)     Past Surgical History:  Procedure Laterality Date   ANTERIOR CERVICAL DECOMP/DISCECTOMY FUSION  2007   BACK SURGERY     BIOPSY  07/29/2019   Procedure: BIOPSY;  Surgeon: Ladene Artist, MD;  Location: [place];  Service: Endoscopy;;   CARDIAC CATHETERIZATION  09/10/2000   normal Coronary arteries, EF60%,    CARDIAC PACEMAKER PLACEMENT  12/01/2007   [employer] Zephyr Elta [place] #[id number], dual chamber   CARDIOVASCULAR STRESS TEST  08/28/2000   Cardiolite perfusion study, EF58%, low risk study,    DILATION AND CURETTAGE OF UTERUS  1960's   ESOPHAGOGASTRODUODENOSCOPY N/A 07/29/2019   Procedure: ESOPHAGOGASTRODUODENOSCOPY (EGD);  Surgeon: Ladene Artist, MD;  Location: [facility] ENDOSCOPY;  Service: Endoscopy;  Laterality: N/A;   HOT HEMOSTASIS N/A 07/29/2019   Procedure: HOT HEMOSTASIS (ARGON PLASMA COAGULATION/BICAP);  Surgeon: Ladene Artist, MD;  Location: [facility] ENDOSCOPY;  Service: Endoscopy;  Laterality: N/A;   KNEE ARTHROSCOPY Right    LAPAROSCOPIC CHOLECYSTECTOMY  1995   LEFT AND RIGHT HEART CATHETERIZATION WITH CORONARY ANGIOGRAM N/A 12/14/2014   Procedure: LEFT AND RIGHT HEART CATHETERIZATION WITH CORONARY ANGIOGRAM;  Surgeon: Peter M Martinique, MD;  Location: [facility] CATH LAB;  Service: Cardiovascular;  Laterality: N/A;   Lower Ext. Dopplers  06/12/2011   no evidence of thrombus, all vessels normal in size, no evidence of insuffiency   LUMBAR LAMINECTOMY/DECOMPRESSION MICRODISCECTOMY  03/02/2012   Procedure: LUMBAR LAMINECTOMY/DECOMPRESSION MICRODISCECTOMY 2 LEVELS;  Surgeon: Zigmund Daniel  Joya Salm, MD;  Location: [facility] NEURO ORS;  Service: Neurosurgery;  Laterality: Bilateral;  Lumbar three, lumbar four, lumbar five Laminectomy/Foraminotomy   MOLE REMOVAL     left side of face near her eye   NM MYOCAR PERF WALL MOTION  10/24/2008   protocol:Lexiscan, EF63%, normal study, scan negative for ischemia   PPM GENERATOR CHANGEOUT N/A 05/26/2018   Procedure: PPM GENERATOR CHANGEOUT;  Surgeon: Sanda Klein, MD;  Location: [place] CV LAB;  Service: Cardiovascular;  Laterality: N/A;   TRANSTHORACIC ECHOCARDIOGRAM  11/30/2007   normal echo, mean transaortic valve gradient 17mHg    There  were no vitals filed for this visit.    Subjective Assessment - 05/27/21 0913     Subjective Patient reports left leg and knee pain. This has been ongoing for about 10 years, she has signficant arthritis with previous injections. Patient states pain is constant, and it feels like the knee wants to give out and she is fearful of falling. She denies any recent falls and states that she uses a walker at home, rarely leaves the house to go in the community. Patient states she does not drive so has difficulty attending appointments, she would like to have therapy in the home.    Patient is accompained by: Family member   daughter assists with subjective   Limitations Lifting;Standing;Walking;House hold activities;Sitting    Diagnostic tests X-ray    Patient Stated Goals Pain relief of left knee and improve strength to reduce risk of falling    Currently in Pain? Yes    Pain Score 6     Pain Location Knee    Pain Orientation Left    Pain Descriptors / Indicators Aching;Sharp;Shooting    Pain Type Chronic pain    Pain Onset More than a month ago    Pain Frequency Constant    Aggravating Factors  Walking, stairs, standing, bending knee    Pain Relieving Factors Cold pack, medication, elevating the left leg                [facility] PT Assessment - 05/27/21 0001       Assessment   Medical Diagnosis Weakness of left lower extremity    Referring Provider (PT) ALorrene Reid PA-C    Onset Date/Surgical Date --   ongoing 10+ years   Prior Therapy No      Precautions   Precautions None      Restrictions   Weight Bearing Restrictions No      Balance Screen   Has the patient fallen in the past 6 months No    Has the patient had a decrease in activity level because of a fear of falling?  Yes    Is the patient reluctant to leave their home because of a fear of falling?  Yes      [place]idence    Living Arrangements Alone    Type of [place]    Entrance Stairs-Number of Steps 5    Entrance Stairs-Rails Right    Home Layout One level      Prior Function   Level of Independence Independent;Needs assistance with homemaking    Vocation Retired      CAssociate Professor  Overall Cognitive Status Within Functional Limits for tasks assessed      Observation/Other Assessments   Observations Patient appears in no apparent distress    Focus on Therapeutic Outcomes (FOTO)  41% functional status      Sensation   Light Touch Appears Intact      Coordination   Gross Motor Movements are Fluid and Coordinated Yes      Functional Tests   Functional tests Single leg stance      Single Leg Stance   Comments Unable on left      ROM / Strength   AROM / PROM / Strength AROM;Strength      AROM   AROM Assessment Site Knee    Right/Left Knee Right;Left    Right Knee Extension -5    Right Knee Flexion 132    Left Knee Extension -10    Left Knee Flexion 110      Strength   Strength Assessment Site Hip;Knee;Ankle    Right/Left Hip Right;Left    Right Hip Flexion 4-/5    Right Hip Extension 3/5    Right Hip ABduction 3-/5    Left Hip Flexion 3/5    Left Hip Extension 3/5    Left Hip ABduction 3-/5    Right/Left Knee Right;Left    Right Knee Flexion 4/5    Right Knee Extension 4/5    Left Knee Flexion 4-/5    Left Knee Extension 4-/5    Right/Left Ankle Right;Left    Right Ankle Dorsiflexion 4+/5    Right Ankle Plantar Flexion 4/5    Left Ankle Dorsiflexion 4/5    Left Ankle Plantar Flexion 4/5      Flexibility   Soft Tissue Assessment /Muscle Length yes    Hamstrings Limited    Quadriceps Limited      Palpation   Patella mobility Limited all directions    Palpation comment Grossly TTP through left knee, especially posterior knee      Transfers   Five time sit to stand comments  Patient requires BUE support for sit<>stand      Ambulation/Gait   Ambulation/Gait Yes    Ambulation/Gait  Assistance 6: Modified independent (Device/Increase time)    Gait Comments Patient did not bring RW to therapy, she required hand hold assist from daughter for balance and support, crouched gai, antalgic on left, slight shuffling gait      Standardized Balance Assessment   Standardized Balance Assessment Timed Up and Go Test;Five Times Sit to Stand    Five times sit to stand comments  36 seconds      Timed Up and Go Test   TUG Normal TUG    Normal TUG (seconds) 42                        Objective measurements completed on examination: See above findings.       [place] Adult PT Treatment/Exercise - 05/27/21 0001       Exercises   Exercises Knee/Hip      Knee/Hip Exercises: Seated   Long Arc Quad 2 sets;10 reps    Sit to [employer] 5 reps      Knee/Hip Exercises: Supine   Bridges 5 reps    Straight Leg Raises 10 reps      Knee/Hip Exercises: Sidelying   Clams x 10                     PT Education - 05/27/21 0926     Education Details Exam findings, POC, HEP; contact PCP if they would like HHPT due to difficulty with transportation    Person(s) Educated Patient    Methods  Explanation;Demonstration;Tactile cues;Verbal cues;Handout    Comprehension Verbalized understanding;Returned demonstration;Verbal cues required;Tactile cues required;Need further instruction              PT Short Term Goals - 05/27/21 0927       PT SHORT TERM GOAL #1   Title Patient will be I with initial HEP to progress with PT    Time 4    Period Weeks    Status New    Target Date 06/24/21      PT SHORT TERM GOAL #2   Title Patient will be able to perform sit<>stand without using BUE for support to improve strength and mobility    Time 4    Period Weeks    Status New    Target Date 06/24/21      PT SHORT TERM GOAL #3   Title PT will review FOTO with patient by 3rd visit to understand expected progress    Time 3    Period Weeks    Status New    Target Date  06/17/21      PT SHORT TERM GOAL #4   Title Patient will report </= 5/10 pain with walking/standing tasks to reduce functional limitation    Time 4    Period Weeks    Status New    Target Date 06/24/21               PT Long Term Goals - 05/27/21 0927       PT LONG TERM GOAL #1   Title Patient will be I with final HEP to maintain progress from PT    Time 8    Period Weeks    Status New    Target Date 07/22/21      PT LONG TERM GOAL #2   Title Patient will report >/= 47% functional status on FOTO to indicate improve functional ability    Time 8    Period Weeks    Status New    Target Date 07/22/21      PT LONG TERM GOAL #3   Title Patient will demonstrate >/= 4+/5 MMT for left knee to improve stair negotiation with less pain    Time 8    Period Weeks    Status New    Target Date 07/22/21      PT LONG TERM GOAL #4   Title Patient will perform 5xSTS in </= 20 seconds to indicate improved strength and transfer ability    Time 8    Period Weeks    Status New    Target Date 07/22/21      PT LONG TERM GOAL #5   Title Patient will demonstrtae TUG </= 15 seconds with LRAD to indicate reduced fall risk    Time 8    Period Weeks    Status New    Target Date 07/22/21                    Plan - 05/27/21 1311     Clinical Impression Statement Patient presents to PT with report of chronic left knee and leg pain with difficulty walking and fear of falling. She currently demonstrates gross LE weakness and exhibits a signifcant fall risk based on 5xSTS and TUG. She typcially uses a RW for ambulation but did not bring it in today, and she requires BUE support for all transfers. Patient was provided exercises to initiate strengthening for the LEs and she would benefit from continued skilled PT to progress  her strength, mobility, and balance in order to reduce her left leg pain, imrpove her walking ability and reduce her fall risk to improve confidence with mobility and  maximize functional ability.    Personal Factors and Comorbidities Fitness;Age;Past/Current Experience;Time since onset of injury/illness/exacerbation;Transportation    Examination-Activity Limitations Locomotion Level;Transfers;Sit;Squat;Stairs;Stand;Lift    Examination-Participation Restrictions Meal Prep;Cleaning;Community Activity;Driving;Shop;Laundry;Yard Work    Stability/Clinical Decision Making Stable/Uncomplicated    Designer, jewellery Low    Rehab Potential Good    PT Frequency 1x / week    PT Duration 8 weeks    PT Treatment/Interventions ADLs/Self Care Home Management;Aquatic Therapy;Cryotherapy;Electrical Stimulation;Iontophoresis 62m/ml Dexamethasone;Moist Heat;Traction;Ultrasound;Neuromuscular re-education;Balance training;Therapeutic exercise;Therapeutic activities;Functional mobility training;Stair training;Gait training;Patient/family education;Manual techniques;Dry needling;Passive range of motion;Taping;Vasopneumatic Device;Spinal Manipulations;Joint Manipulations    PT Next Visit Plan Review HEP and progress PRN, continue LE strengthening with progression to closed chain and standing, balance and gait training    PT Home Exercise Plan [email or web address]    Consulted and Agree with Plan of Care Patient             Patient will benefit from skilled therapeutic intervention in order to improve the following deficits and impairments:  Decreased range of motion, Abnormal gait, Difficulty walking, Pain, Decreased activity tolerance, Decreased balance, Impaired flexibility, Decreased strength  Visit Diagnosis: Chronic pain of left knee  Pain in left leg  Muscle weakness (generalized)  Other abnormalities of gait and mobility     Problem List Patient Active Problem List   Diagnosis Date Noted   History of Helicobacter infection 10/06/2019   Bleeding duodenal ulcer 10/06/2019   Peptic ulcer disease 10/06/2019   PAH (pulmonary artery hypertension) ([facility] 08/19/2019    Symptomatic anemia 07/29/2019   Mild renal insufficiency 07/29/2019   Pressure injury of skin 07/29/2019   Near syncope    Melena    Normocytic anemia 07/04/2019   Heme positive stool 07/04/2019   Other constipation 07/04/2019   Low hemoglobin 06/15/2019   Physical deconditioning 05/05/2019   Healthcare maintenance 10/20/2018   Other chronic pain 10/20/2018   Rib fracture 07/02/2018   Diabetes mellitus type 2, controlled, without complications ([place] 176/22/6333  Acute respiratory failure with hypoxia ([facility] 07/01/2018   Pacemaker battery depletion 05/26/2018   Mild obesity 01/05/2016   Chronic respiratory failure with hypoxia ([place] 10/29/2015   Upper airway cough syndrome 03/04/2015   COPD GOLD II 01/18/2015   Pulmonary hypertension (HCC)    Dyspnea    DOE (dyspnea on exertion)    Sleep apnea    Hypoxemia    Acute respiratory failure with hypoxemia (HCC)    Acute pulmonary edema (HCC)    Chronic combined systolic and diastolic CHF (congestive heart failure) ([place] 12/06/2014   Accelerated hypertension with diastolic congestive heart failure, NYHA class 3 ([facility] 12/06/2014   CHB (complete heart block) ([place] 09/19/2013   Pacemaker - dual chamber [employer], 2009 09/19/2013   Essential hypertension 09/19/2013   Normal coronary arteries angiography 2002 09/19/2013   Hyperlipidemia 09/19/2013   Varicose veins of lower extremities with other complications 154/56/2563   CHilda Blades PT, DPT, LAT, ATC 05/27/21  1:20 PM Phone: [phone]: 3[facility] [address]Phone: [phone]  Fax:  [phone] Name: PJASON HAUGEMRN: [id number] of Birth: 115-Jun-1942  PHYSICAL THERAPY DISCHARGE SUMMARY  Visits from Start of Care: 1  Current functional level related to goals / functional outcomes: See above  Remaining deficits: See above   Education / Equipment: HEP    Patient agrees to discharge. Patient goals were not met. Patient is being discharged due to not returning since the last visit.  Hilda Blades, PT, DPT, LAT, ATC 07/11/21  5:01 PM Phone: [phone] Fax: [phone]

## 2021-05-27 NOTE — Patient Instructions (Signed)
Access Code: [email or web address] URL: [email or web address] Date: 05/27/2021 Prepared by: Hilda Blades  Exercises Sit to Stand with Armchair - 5-6 x daily - 7 x weekly - 5 reps Seated Long Arc Quad - 1-2 x daily - 7 x weekly - 2 sets - 10 reps Active Straight Leg Raise with Quad Set - 1-2 x daily - 7 x weekly - 2 sets - 10 reps Bridge - 1-2 x daily - 7 x weekly - 2 sets - 5 reps Clamshell - 1-2 x daily - 7 x weekly - 2 sets - 10 reps

## 2021-05-30 ENCOUNTER — Ambulatory Visit (INDEPENDENT_AMBULATORY_CARE_PROVIDER_SITE_OTHER): Payer: Medicare Other

## 2021-05-30 DIAGNOSIS — Z0000 Encounter for general adult medical examination without abnormal findings: Secondary | ICD-10-CM | POA: Diagnosis not present

## 2021-05-30 NOTE — Patient Instructions (Signed)
Diabetes Mellitus and Foot Care Foot care is an important part of your health, especially when you have diabetes. Diabetes may cause you to have problems because of poor blood flow (circulation) to your feet and legs, which can cause your skin to: Become thinner and drier. Break more easily. Heal more slowly. Peel and crack. You may also have nerve damage (neuropathy) in your legs and feet, causing decreased feeling in them. This means that you may not notice minor injuries to your feet that could lead to more serious problems. Noticing and addressing any potential problems early is the best way to prevent future foot problems. How to care for your feet Foot hygiene  Wash your feet daily with warm water and mild soap. Do not use hot water. Then, pat your feet and the areas between your toes until they are completely dry. Do not soak your feet as this can dry your skin. Trim your toenails straight across. Do not dig under them or around the cuticle. File the edges of your nails with an emery board or nail file. Apply a moisturizing lotion or petroleum jelly to the skin on your feet and to dry, brittle toenails. Use lotion that does not contain alcohol and is unscented. Do not apply lotion between your toes. Shoes and socks Wear clean socks or stockings every day. Make sure they are not too tight. Do not wear knee-high stockings since they may decrease blood flow to your legs. Wear shoes that fit properly and have enough cushioning. Always look in your shoes before you put them on to be sure there are no objects inside. To break in new shoes, wear them for just a few hours a day. This prevents injuries on your feet. Wounds, scrapes, corns, and calluses  Check your feet daily for blisters, cuts, bruises, sores, and redness. If you cannot see the bottom of your feet, use a mirror or ask someone for help. Do not cut corns or calluses or try to remove them with medicine. If you find a minor scrape,  cut, or break in the skin on your feet, keep it and the skin around it clean and dry. You may clean these areas with mild soap and water. Do not clean the area with peroxide, alcohol, or iodine. If you have a wound, scrape, corn, or callus on your foot, look at it several times a day to make sure it is healing and not infected. Check for: Redness, swelling, or pain. Fluid or blood. Warmth. Pus or a bad smell. General tips Do not cross your legs. This may decrease blood flow to your feet. Do not use heating pads or hot water bottles on your feet. They may burn your skin. If you have lost feeling in your feet or legs, you may not know this is happening until it is too late. Protect your feet from hot and cold by wearing shoes, such as at the beach or on hot pavement. Schedule a complete foot exam at least once a year (annually) or more often if you have foot problems. Report any cuts, sores, or bruises to your health care provider immediately. Where to find more information American Diabetes Association: www.diabetes.org Association of Diabetes [employer] & Education Specialists: www.diabeteseducator.org Contact a health care provider if: You have a medical condition that increases your risk of infection and you have any cuts, sores, or bruises on your feet. You have an injury that is not healing. You have redness on your legs or feet. You   feel burning or tingling in your legs or feet. You have pain or cramps in your legs and feet. Your legs or feet are numb. Your feet always feel cold. You have pain around any toenails. Get help right away if: You have a wound, scrape, corn, or callus on your foot and: You have pain, swelling, or redness that gets worse. You have fluid or blood coming from the wound, scrape, corn, or callus. Your wound, scrape, corn, or callus feels warm to the touch. You have pus or a bad smell coming from the wound, scrape, corn, or callus. You have a fever. You have a red  line going up your leg. Summary Check your feet every day for blisters, cuts, bruises, sores, and redness. Apply a moisturizing lotion or petroleum jelly to the skin on your feet and to dry, brittle toenails. Wear shoes that fit properly and have enough cushioning. If you have foot problems, report any cuts, sores, or bruises to your health care provider immediately. Schedule a complete foot exam at least once a year (annually) or more often if you have foot problems. This information is not intended to replace advice given to you by your health care provider. Make sure you discuss any questions you have with your health care provider. Document Revised: 03/08/2020 Document Reviewed: 03/08/2020 Elsevier Patient Education  2022 [place] Prevention in the Home, Adult Falls can cause injuries and can happen to people of all ages. There are many things you can do to make your home safe and to help prevent falls. Ask for help when making these changes. What actions can I take to prevent falls? General Instructions Use good lighting in all rooms. Replace any light bulbs that burn out. Turn on the lights in dark areas. Use night-lights. Keep items that you use often in easy-to-reach places. Lower the shelves around your home if needed. Set up your furniture so you have a clear path. Avoid moving your furniture around. Do not have throw rugs or other things on the floor that can make you trip. Avoid walking on wet floors. If any of your floors are uneven, fix them. Add color or contrast paint or tape to clearly mark and help you see: Grab bars or handrails. First and last steps of staircases. Where the edge of each step is. If you use a stepladder: Make sure that it is fully opened. Do not climb a closed stepladder. Make sure the sides of the stepladder are locked in place. Ask someone to hold the stepladder while you use it. Know where your pets are when moving through your  home. What can I do in the bathroom?   Keep the floor dry. Clean up any water on the floor right away. Remove soap buildup in the tub or shower. Use nonskid mats or decals on the floor of the tub or shower. Attach bath mats securely with double-sided, nonslip rug tape. If you need to sit down in the shower, use a plastic, nonslip stool. Install grab bars by the toilet and in the tub and shower. Do not use towel bars as grab bars. What can I do in the bedroom? Make sure that you have a light by your bed that is easy to reach. Do not use any sheets or blankets for your bed that hang to the floor. Have a firm chair with side arms that you can use for support when you get dressed. What can I do in the kitchen? Clean up  any spills right away. If you need to reach something above you, use a step stool with a grab bar. Keep electrical cords out of the way. Do not use floor polish or wax that makes floors slippery. What can I do with my stairs? Do not leave any items on the stairs. Make sure that you have a light switch at the top and the bottom of the stairs. Make sure that there are handrails on both sides of the stairs. Fix handrails that are broken or loose. Install nonslip stair treads on all your stairs. Avoid having throw rugs at the top or bottom of the stairs. Choose a carpet that does not hide the edge of the steps on the stairs. Check carpeting to make sure that it is firmly attached to the stairs. Fix carpet that is loose or worn. What can I do on the outside of my home? Use bright outdoor lighting. Fix the edges of walkways and driveways and fix any cracks. Remove anything that might make you trip as you walk through a door, such as a raised step or threshold. Trim any bushes or trees on paths to your home. Check to see if handrails are loose or broken and that both sides of all steps have handrails. Install guardrails along the edges of any raised decks and porches. Clear paths  of anything that can make you trip, such as tools or rocks. Have leaves, snow, or ice cleared regularly. Use sand or salt on paths during winter. Clean up any spills in your garage right away. This includes grease or oil spills. What other actions can I take? Wear shoes that: Have a low heel. Do not wear high heels. Have rubber bottoms. Feel good on your feet and fit well. Are closed at the toe. Do not wear open-toe sandals. Use tools that help you move around if needed. These include: Canes. Walkers. Scooters. Crutches. Review your medicines with your doctor. Some medicines can make you feel dizzy. This can increase your chance of falling. Ask your doctor what else you can do to help prevent falls. Where to find more information Centers for Disease Control and Prevention, [employer]: http://www.wolf.info/ National Institute on Aging: http://kim-miller.com/ Contact a doctor if: You are afraid of falling at home. You feel weak, drowsy, or dizzy at home. You fall at home. Summary There are many simple things that you can do to make your home safe and to help prevent falls. Ways to make your home safe include removing things that can make you trip and installing grab bars in the bathroom. Ask for help when making these changes in your home. This information is not intended to replace advice given to you by your health care provider. Make sure you discuss any questions you have with your health care provider. Document Revised: 03/21/2020 Document Reviewed: 03/21/2020 Elsevier Patient Education  [place] Maintenance, Female Adopting a healthy lifestyle and getting preventive care are important in promoting health and wellness. Ask your health care provider about: The right schedule for you to have regular tests and exams. Things you can do on your own to prevent diseases and keep yourself healthy. What should I know about diet, weight, and exercise? Eat a healthy diet  Eat a diet that  includes plenty of vegetables, fruits, low-fat dairy products, and lean protein. Do not eat a lot of foods that are high in solid fats, added sugars, or sodium. Maintain a healthy weight Body mass index (BMI) is used  to identify weight problems. It estimates body fat based on height and weight. Your health care provider can help determine your BMI and help you achieve or maintain a healthy weight. Get regular exercise Get regular exercise. This is one of the most important things you can do for your health. Most adults should: Exercise for at least 150 minutes each week. The exercise should increase your heart rate and make you sweat (moderate-intensity exercise). Do strengthening exercises at least twice a week. This is in addition to the moderate-intensity exercise. Spend less time sitting. Even light physical activity can be beneficial. Watch cholesterol and blood lipids Have your blood tested for lipids and cholesterol at 82 years of age, then have this test every 5 years. Have your cholesterol levels checked more often if: Your lipid or cholesterol levels are high. You are older than 82 years of age. You are at high risk for heart disease. What should I know about cancer screening? Depending on your health history and family history, you may need to have cancer screening at various ages. This may include screening for: Breast cancer. Cervical cancer. Colorectal cancer. Skin cancer. Lung cancer. What should I know about heart disease, diabetes, and high blood pressure? Blood pressure and heart disease High blood pressure causes heart disease and increases the risk of stroke. This is more likely to develop in people who have high blood pressure readings, are of African descent, or are overweight. Have your blood pressure checked: Every 3-5 years if you are 36-22 years of age. Every year if you are 61 years old or older. Diabetes Have regular diabetes screenings. This checks your  fasting blood sugar level. Have the screening done: Once every three years after age 43 if you are at a normal weight and have a low risk for diabetes. More often and at a younger age if you are overweight or have a high risk for diabetes. What should I know about preventing infection? Hepatitis B If you have a higher risk for hepatitis B, you should be screened for this virus. Talk with your health care provider to find out if you are at risk for hepatitis B infection. Hepatitis C Testing is recommended for: Everyone born from [birth year] through 1967. Anyone with known risk factors for hepatitis C. Sexually transmitted infections (STIs) Get screened for STIs, including gonorrhea and chlamydia, if: You are sexually active and are younger than 82 years of age. You are older than 82 years of age and your health care provider tells you that you are at risk for this type of infection. Your sexual activity has changed since you were last screened, and you are at increased risk for chlamydia or gonorrhea. Ask your health care provider if you are at risk. Ask your health care provider about whether you are at high risk for HIV. Your health care provider may recommend a prescription medicine to help prevent HIV infection. If you choose to take medicine to prevent HIV, you should first get tested for HIV. You should then be tested every 3 months for as long as you are taking the medicine. Pregnancy If you are about to stop having your period (premenopausal) and you may become pregnant, seek counseling before you get pregnant. Take 400 to 800 micrograms (mcg) of folic acid every day if you become pregnant. Ask for birth control (contraception) if you want to prevent pregnancy. Osteoporosis and menopause Osteoporosis is a disease in which the bones lose minerals and strength with aging. This can  result in bone fractures. If you are 72 years old or older, or if you are at risk for osteoporosis and fractures, ask  your health care provider if you should: Be screened for bone loss. Take a calcium or vitamin D supplement to lower your risk of fractures. Be given hormone replacement therapy (HRT) to treat symptoms of menopause. Follow these instructions at home: Lifestyle Do not use any products that contain nicotine or tobacco, such as cigarettes, e-cigarettes, and chewing tobacco. If you need help quitting, ask your health care provider. Do not use street drugs. Do not share needles. Ask your health care provider for help if you need support or information about quitting drugs. Alcohol use Do not drink alcohol if: Your health care provider tells you not to drink. You are pregnant, may be pregnant, or are planning to become pregnant. If you drink alcohol: Limit how much you use to 0-1 drink a day. Limit intake if you are breastfeeding. Be aware of how much alcohol is in your drink. In the [place] one drink equals one 12 oz bottle of beer (355 mL), one 5 oz glass of wine (148 mL), or one 1 oz glass of hard liquor (44 mL). General instructions Schedule regular health, dental, and eye exams. Stay current with your vaccines. Tell your health care provider if: You often feel depressed. You have ever been abused or do not feel safe at home. Summary Adopting a healthy lifestyle and getting preventive care are important in promoting health and wellness. Follow your health care provider's instructions about healthy diet, exercising, and getting tested or screened for diseases. Follow your health care provider's instructions on monitoring your cholesterol and blood pressure. This information is not intended to replace advice given to you by your health care provider. Make sure you discuss any questions you have with your health care provider. Document Revised: 10/26/2020 Document Reviewed: 08/11/2018 Elsevier Patient Education  2022 [employer].

## 2021-05-30 NOTE — Progress Notes (Signed)
I connected with  [name] on 05/30/21 by a video enabled telemedicine application and verified that I am speaking with the correct person using two identifiers.   I discussed the limitations of evaluation and management by telemedicine. The patient expressed understanding and agreed to proceed.  Subjective:   [name] is a 80 y.o. female who presents for Medicare Annual (Subsequent) preventive examination.  Review of Systems    Defer to PCP Cardiac Risk Factors include: advanced age (>11men, >47 women);diabetes mellitus     Objective:    Today's Vitals   05/30/21 2302  PainSc: 0-No pain   There is no height or weight on file to calculate BMI.  Advanced Directives 05/30/2021 05/27/2021 07/29/2019 07/28/2019 09/21/2018 07/01/2018 05/26/2018  Does Patient Have a Medical Advance Directive? Yes Yes No No No No No  Type of Paramedic of [place];Living will - - - - -  Does patient want to make changes to medical advance directive? No - Patient declined - - - - - -  Copy of [place] in Chart? No - copy requested - - - - - -  Would patient like information on creating a medical advance directive? - - No - Patient declined No - Patient declined No - Patient declined No - Patient declined -  Pre-existing out of facility DNR order (yellow form or pink MOST form) - - - - - - -    Current Medications (verified) Outpatient Encounter Medications as of 05/30/2021  Medication Sig   acetaminophen (TYLENOL) 500 MG tablet Take 500 mg by mouth every 6 (six) hours as needed for mild pain.    aspirin EC 81 MG tablet Take 81 mg by mouth daily.   furosemide (LASIX) 40 MG tablet TAKE 1 TABLET DAILY. MAY TAKE AN EXTRA TABLET FOR WEIGHT GAIN OF 2 LB IN A DAY & 5 LBS IN A WEEK   KLOR-CON M20 20 MEQ tablet Take 1 tablet (20 mEq total) by mouth daily.   losartan (COZAAR) 50 MG tablet Take 50 mg by mouth daily.   Nutritional  Supplements (JUICE PLUS FIBRE PO) Take 3 tablets by mouth at bedtime.   OXYGEN 2lpm with sleep and exertion [employer]   pantoprazole (PROTONIX) 40 MG tablet Take 1 tablet (40 mg total) by mouth daily.   pravastatin (PRAVACHOL) 40 MG tablet Take 1 tablet (40 mg total) by mouth daily.   Respiratory Therapy Supplies (FLUTTER) DEVI Use flutter device three times a day as instructed   No facility-administered encounter medications on file as of 05/30/2021.    Allergies (verified) Patient has no known allergies.   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   "after spinal cord injury/OR"   Shortness of breath    exertional   Type II diabetes mellitus ([place])    "  had it years ago; lost weight after neck OR in 2007; glucose went way down; recently dx'd as returned recently" (12/07/2014)   Urinary incontinence    "since 2007's OR"   Venous insufficiency (chronic) (peripheral)    Past Surgical History:  Procedure Laterality Date   ANTERIOR CERVICAL DECOMP/DISCECTOMY FUSION  2007   BACK SURGERY     BIOPSY  07/29/2019   Procedure: BIOPSY;  Surgeon: Ladene Artist, MD;  Location: [place];  Service: Endoscopy;;   CARDIAC CATHETERIZATION  09/10/2000   normal Coronary arteries, EF60%,    CARDIAC PACEMAKER PLACEMENT  12/01/2007   [employer] Elta [place] #[id number], dual chamber   CARDIOVASCULAR STRESS TEST  08/28/2000   Cardiolite perfusion study, EF58%, low risk study,     DILATION AND CURETTAGE OF UTERUS  1960's   ESOPHAGOGASTRODUODENOSCOPY N/A 07/29/2019   Procedure: ESOPHAGOGASTRODUODENOSCOPY (EGD);  Surgeon: Ladene Artist, MD;  Location[facility] ENDOSCOPY;  Service: Endoscopy;  Laterality: N/A;   HOT HEMOSTASIS N/A 07/29/2019   Procedure: HOT HEMOSTASIS (ARGON PLASMA COAGULATION/BICAP);  Surgeon: Ladene Artist, MD;  Location[facility] ENDOSCOPY;  Service: Endoscopy;  Laterality: N/A;   KNEE ARTHROSCOPY Right    LAPAROSCOPIC CHOLECYSTECTOMY  1995   LEFT AND RIGHT HEART CATHETERIZATION WITH CORONARY ANGIOGRAM N/A 12/14/2014   Procedure: LEFT AND RIGHT HEART CATHETERIZATION WITH CORONARY ANGIOGRAM;  Surgeon: Peter M Martinique, MD;  Location: [facility] CATH LAB;  Service: Cardiovascular;  Laterality: N/A;   Lower Ext. Dopplers  06/12/2011   no evidence of thrombus, all vessels normal in size, no evidence of insuffiency   LUMBAR LAMINECTOMY/DECOMPRESSION MICRODISCECTOMY  03/02/2012   Procedure: LUMBAR LAMINECTOMY/DECOMPRESSION MICRODISCECTOMY 2 LEVELS;  Surgeon: Floyce Stakes, MD;  Location: [place] NEURO ORS;  Service: Neurosurgery;  Laterality: Bilateral;  Lumbar three, lumbar four, lumbar five Laminectomy/Foraminotomy   MOLE REMOVAL     left side of face near her eye   NM MYOCAR PERF WALL MOTION  10/24/2008   protocol:Lexiscan, EF63%, normal study, scan negative for ischemia   PPM GENERATOR CHANGEOUT N/A 05/26/2018   Procedure: PPM GENERATOR CHANGEOUT;  Surgeon: Sanda Klein, MD;  Location: [place] CV LAB;  Service: Cardiovascular;  Laterality: N/A;   TRANSTHORACIC ECHOCARDIOGRAM  11/30/2007   normal echo, mean transaortic valve gradient 61mmHg   Family History  Problem Relation Age of Onset   Diabetes Father    Heart disease Father        before age 20   Hyperlipidemia Father    Hypertension Father    Heart attack Father    Congestive Heart Failure Father    Diabetes Mother    Heart disease Mother    Hyperlipidemia Mother    Hypertension Mother    Congestive  Heart Failure Mother    Diabetes Brother    Congestive Heart Failure Brother    Colon cancer Neg Hx    Esophageal cancer Neg Hx    Rectal cancer Neg Hx    Social History   Socioeconomic History   Marital status: Widowed    Spouse name: Not on file   Number of children: 2   Years of education: Not on file   Highest education level: Not on file  Occupational History   Occupation: Self employed- Academic librarian business -retired  Tobacco Use   Smoking status: Former    Packs/day: 0.50    Years: 32.00    Pack years: 16.00    Types: Cigarettes    Start date: 1963    Quit date: 03/02/1994  Years since quitting: 27.2   Smokeless tobacco: Never  Vaping Use   Vaping Use: Never used  Substance and Sexual Activity   Alcohol use: No   Drug use: No   Sexual activity: Not Currently    Birth control/protection: None  Other Topics Concern   Not on file  Social History Narrative   Not on file   Social Determinants of Health   Financial Resource Strain: Low Risk    Difficulty of Paying Living Expenses: Not hard at all  Food Insecurity: No Food Insecurity   Worried About Charity fundraiser in the Last Year: Never true   Firth in the Last Year: Never true  Transportation Needs: No Transportation Needs   Lack of Transportation (Medical): No   Lack of Transportation (Non-Medical): No  Physical Activity: Sufficiently Active   Days of Exercise per Week: 7 days   Minutes of Exercise per Session: 40 min  Stress: No Stress Concern Present   Feeling of Stress : Not at all  Social Connections: Unknown   Frequency of Communication with Friends and Family: More than three times a week   Frequency of Social Gatherings with Friends and Family: More than three times a week   Attends Religious Services: Never   Marine scientist or Organizations: No   Attends Music therapist: Never   Marital Status: Not on file    Tobacco Counseling Counseling given: Not  Answered   Clinical Intake:     Pain : No/denies pain Pain Score: 0-No pain     Diabetes: Yes CBG done?: No Did pt. bring in CBG monitor from home?: No     Diabetic?Yes  Interpreter Needed?: No      Activities of Daily Living In your present state of health, do you have any difficulty performing the following activities: 05/30/2021 01/16/2021  Hearing? Tempie Donning  Vision? N N  Difficulty concentrating or making decisions? N Y  Walking or climbing stairs? Y Y  Dressing or bathing? Y N  Doing errands, shopping? Tempie Donning  Preparing Food and eating ? Y -  Using the Toilet? N -  In the past six months, have you accidently leaked urine? N -  Do you have problems with loss of bowel control? N -  Managing your Medications? N -  Managing your Finances? N -  Housekeeping or managing your Housekeeping? N -  Some recent data might be hidden    Patient Care Team: Lorrene Reid, PA-C as PCP - General (Physician Assistant) Sanda Klein, MD as PCP - Cardiology (Cardiology)  Indicate any recent Medical Services you may have received from other than [facility] providers in the past year (date may be approximate).     Assessment:   This is a routine wellness examination for Jennifier.  Hearing/Vision screen No results found.  Dietary issues and exercise activities discussed: Current Exercise Habits: Home exercise routine, Time (Minutes): 45, Frequency (Times/Week): 7, Weekly Exercise (Minutes/Week): 315, Intensity: Mild, Exercise limited by: orthopedic condition(s)   Goals Addressed   None   Depression Screen PHQ 2/9 Scores 05/30/2021 01/16/2021 08/02/2020 04/27/2020 01/24/2020 06/15/2019 12/06/2018  PHQ - 2 Score 0 - - 0 0 0 0  PHQ- 9 Score - - - 0 0 4 0  Exception Documentation - Patient refusal Patient refusal - - - -    Fall Risk Fall Risk  05/30/2021 01/16/2021 08/02/2020 04/27/2020 12/27/2014  Falls in the past year? 0 0 0 0 Yes  Number falls in past yr: 0 0 - - 2 or more  Injury with  Fall? 0 0 - - Yes  Comment - - - - knee pain  Risk for fall due to : - Impaired balance/gait;Impaired mobility - - History of fall(s)  Follow up Falls evaluation completed Falls evaluation completed Falls evaluation completed Falls evaluation completed Education provided;Falls prevention discussed    FALL RISK PREVENTION PERTAINING TO THE HOME:  Any stairs in or around the home? Yes  If so, are there any without handrails? No  Home free of loose throw rugs in walkways, pet beds, electrical cords, etc? Yes  Adequate lighting in your home to reduce risk of falls? Yes   ASSISTIVE DEVICES UTILIZED TO PREVENT FALLS:  Life alert? No  Use of a cane, walker or w/c? Yes  Grab bars in the bathroom? Yes  Shower chair or bench in shower? Yes  Elevated toilet seat or a handicapped toilet? No   TIMED UP AND GO:  Was the test performed? No .  Length of time to ambulate 10 feet: 0 sec.   Gait unsteady without use of assistive device, provider informed and interventions were implemented  Cognitive Function:     6CIT Screen 05/30/2021  What Year? 0 points  What month? 0 points  What time? 0 points  Count back from 20 0 points  Months in reverse 0 points  Repeat phrase 0 points  Total Score 0    Immunizations Immunization History  Administered Date(s) Administered   Influenza Split 06/01/2014, 06/02/2015   Influenza, High Dose Seasonal PF 06/04/2018   PFIZER Comirnaty(Gray Top)Covid-19 Tri-Sucrose Vaccine 12/13/2020   PFIZER(Purple Top)SARS-COV-2 Vaccination 06/25/2020   Pneumococcal Polysaccharide-23 06/01/2006    TDAP status: Up to date  Flu Vaccine status: Up to date  Pneumococcal vaccine status: Up to date  Covid-19 vaccine status: Declined, Education has been provided regarding the importance of this vaccine but patient still declined. Advised may receive this vaccine at local pharmacy or Health Dept.or vaccine clinic. Aware to provide a copy of the vaccination record if  obtained from local pharmacy or Health Dept. Verbalized acceptance and understanding.  Qualifies for Shingles Vaccine? Yes   Zostavax completed Yes   Shingrix Completed?: Yes  Screening Tests Health Maintenance  Topic Date Due   OPHTHALMOLOGY EXAM  Never done   TETANUS/TDAP  Never done   Zoster Vaccines- Shingrix (1 of 2) Never done   DEXA SCAN  Never done   COVID-19 Vaccine (3 - Pfizer risk series) 01/10/2021   FOOT EXAM  01/23/2021   INFLUENZA VACCINE  04/01/2021   HEMOGLOBIN A1C  07/03/2021   HPV VACCINES  Aged Out    Health Maintenance  Health Maintenance Due  Topic Date Due   OPHTHALMOLOGY EXAM  Never done   TETANUS/TDAP  Never done   Zoster Vaccines- Shingrix (1 of 2) Never done   DEXA SCAN  Never done   COVID-19 Vaccine (3 - Pfizer risk series) 01/10/2021   FOOT EXAM  01/23/2021   INFLUENZA VACCINE  04/01/2021    Colorectal cancer screening: No longer required.   Mammogram status: No longer required due to Age..  Bone Density status: Completed  . Results reflect: Bone density results: NORMAL. Repeat every 0 years.  Lung Cancer Screening: (Low Dose CT Chest recommended if Age 4-80 years, 30 pack-year currently smoking OR have quit w/in 15years.) does not qualify.   Lung Cancer Screening Referral: No  Additional Screening:  Hepatitis C Screening: does  not qualify; Completed   Vision Screening: Recommended annual ophthalmology exams for early detection of glaucoma and other disorders of the eye. Is the patient up to date with their annual eye exam?  Yes  Who is the provider or what is the name of the office in which the patient attends annual eye exams? NA If pt is not established with a provider, would they like to be referred to a provider to establish care? No .   Dental Screening: Recommended annual dental exams for proper oral hygiene  Community Resource Referral / Chronic Care Management: CRR required this visit?  No   CCM required this visit?  No       Plan:     I have personally reviewed and noted the following in the patient's chart:   Medical and social history Use of alcohol, tobacco or illicit drugs  Current medications and supplements including opioid prescriptions.  Functional ability and status Nutritional status Physical activity Advanced directives List of other physicians Hospitalizations, surgeries, and ER visits in previous 12 months Vitals Screenings to include cognitive, depression, and falls Referrals and appointments  In addition, I have reviewed and discussed with patient certain preventive protocols, quality metrics, and best practice recommendations. A written personalized care plan for preventive services as well as general preventive health recommendations were provided to patient.     Stephan Minister, [place]   05/30/2021   Nurse Notes:

## 2021-06-05 ENCOUNTER — Ambulatory Visit (INDEPENDENT_AMBULATORY_CARE_PROVIDER_SITE_OTHER): Payer: Medicare Other

## 2021-06-05 DIAGNOSIS — I442 Atrioventricular block, complete: Secondary | ICD-10-CM | POA: Diagnosis not present

## 2021-06-05 LAB — CUP PACEART REMOTE DEVICE CHECK
Battery Remaining Longevity: 83 mo
Battery Remaining Percentage: 72 %
Battery Voltage: 2.99 V
Brady Statistic AP VP Percent: 34 %
Brady Statistic AP VS Percent: 1 %
Brady Statistic AS VP Percent: 66 %
Brady Statistic AS VS Percent: 1 %
Brady Statistic RA Percent Paced: 33 %
Brady Statistic RV Percent Paced: 99 %
Date Time Interrogation Session: 20221005020013
Implantable Lead Implant Date: 20090401
Implantable Lead Implant Date: 20090401
Implantable Lead Location: 753859
Implantable Lead Location: 753860
Implantable Pulse Generator Implant Date: 20190925
Lead Channel Impedance Value: 430 Ohm
Lead Channel Impedance Value: 430 Ohm
Lead Channel Pacing Threshold Amplitude: 0.75 V
Lead Channel Pacing Threshold Amplitude: 0.875 V
Lead Channel Pacing Threshold Pulse Width: 0.5 ms
Lead Channel Pacing Threshold Pulse Width: 0.5 ms
Lead Channel Sensing Intrinsic Amplitude: 12 mV
Lead Channel Sensing Intrinsic Amplitude: 2.9 mV
Lead Channel Setting Pacing Amplitude: 1.125
Lead Channel Setting Pacing Amplitude: 2 V
Lead Channel Setting Pacing Pulse Width: 0.5 ms
Lead Channel Setting Sensing Sensitivity: 4 mV
Pulse Gen Model: 2272
Pulse Gen Serial Number: 9064942

## 2021-06-13 NOTE — Progress Notes (Signed)
Remote pacemaker transmission.   

## 2021-06-24 ENCOUNTER — Other Ambulatory Visit: Payer: Self-pay | Admitting: Cardiovascular Disease

## 2021-06-28 ENCOUNTER — Other Ambulatory Visit: Payer: Self-pay | Admitting: Cardiovascular Disease

## 2021-08-20 ENCOUNTER — Telehealth: Payer: Self-pay | Admitting: Physician Assistant

## 2021-08-20 NOTE — Telephone Encounter (Signed)
close

## 2021-08-22 ENCOUNTER — Encounter: Payer: Self-pay | Admitting: Physician Assistant

## 2021-08-22 ENCOUNTER — Ambulatory Visit (INDEPENDENT_AMBULATORY_CARE_PROVIDER_SITE_OTHER): Payer: Medicare Other | Admitting: Physician Assistant

## 2021-08-22 ENCOUNTER — Other Ambulatory Visit: Payer: Self-pay

## 2021-08-22 VITALS — BP 138/82 | HR 90 | Temp 98.1°F | Ht 62.0 in | Wt 158.0 lb

## 2021-08-22 DIAGNOSIS — G8929 Other chronic pain: Secondary | ICD-10-CM

## 2021-08-22 DIAGNOSIS — E119 Type 2 diabetes mellitus without complications: Secondary | ICD-10-CM | POA: Diagnosis not present

## 2021-08-22 DIAGNOSIS — I152 Hypertension secondary to endocrine disorders: Secondary | ICD-10-CM

## 2021-08-22 DIAGNOSIS — M6281 Muscle weakness (generalized): Secondary | ICD-10-CM | POA: Diagnosis not present

## 2021-08-22 DIAGNOSIS — M25562 Pain in left knee: Secondary | ICD-10-CM | POA: Diagnosis not present

## 2021-08-22 DIAGNOSIS — E1159 Type 2 diabetes mellitus with other circulatory complications: Secondary | ICD-10-CM

## 2021-08-22 MED ORDER — LOSARTAN POTASSIUM 50 MG PO TABS: 50.0000 mg | ORAL_TABLET | Freq: Every day | ORAL | 0 refills | Status: DC

## 2021-08-22 NOTE — Patient Instructions (Signed)
Osteoarthritis Osteoarthritis is a type of arthritis. It refers to joint pain or joint disease. Osteoarthritis affects tissue that covers the ends of bones in joints (cartilage). Cartilage acts as a cushion between the bones and helps them move smoothly. Osteoarthritis occurs when cartilage in the joints gets worn down. Osteoarthritis is sometimes called "wear and tear" arthritis. Osteoarthritis is the most common form of arthritis. It often occurs in older people. It is a condition that gets worse over time. The joints most often affected by this condition are in the fingers, toes, hips, knees, and spine, including the neck and lower back. What are the causes? This condition is caused by the wearing down of cartilage that covers the ends of bones. What increases the risk? The following factors may make you more likely to develop this condition: Being age 50 or older. Obesity. Overuse of joints. Past injury of a joint. Past surgery on a joint. Family history of osteoarthritis. What are the signs or symptoms? The main symptoms of this condition are pain, swelling, and stiffness in the joint. Other symptoms may include: An enlarged joint. More pain and further damage caused by small pieces of bone or cartilage that break off and float inside of the joint. Small deposits of bone (osteophytes) that grow on the edges of the joint. A grating or scraping feeling inside the joint when you move it. Popping or creaking sounds when you move. Difficulty walking or exercising. An inability to grip items, twist your hand(s), or control the movements of your hands and fingers. How is this diagnosed? This condition may be diagnosed based on: Your medical history. A physical exam. Your symptoms. X-rays of the affected joint(s). Blood tests to rule out other types of arthritis. How is this treated? There is no cure for this condition, but treatment can help control pain and improve joint function.  Treatment may include a combination of therapies, such as: Pain relief techniques, such as: Applying heat and cold to the joint. Massage. A form of talk therapy called cognitive behavioral therapy (CBT). This therapy helps you set goals and follow up on the changes that you make. Medicines for pain and inflammation. The medicines can be taken by mouth or applied to the skin. They include: NSAIDs, such as ibuprofen. Prescription medicines. Strong anti-inflammatory medicines (corticosteroids). Certain nutritional supplements. A prescribed exercise program. You may work with a physical therapist. Assistive devices, such as a brace, wrap, splint, specialized glove, or cane. A weight control plan. Surgery, such as: An osteotomy. This is done to reposition the bones and relieve pain or to remove loose pieces of bone and cartilage. Joint replacement surgery. You may need this surgery if you have advanced osteoarthritis. Follow these instructions at home: Activity Rest your affected joints as told by your health care provider. Exercise as told by your health care provider. He or she may recommend specific types of exercise, such as: Strengthening exercises. These are done to strengthen the muscles that support joints affected by arthritis. Aerobic activities. These are exercises, such as brisk walking or water aerobics, that increase your heart rate. Range-of-motion activities. These help your joints move more easily. Balance and agility exercises. Managing pain, stiffness, and swelling   If directed, apply heat to the affected area as often as told by your health care provider. Use the heat source that your health care provider recommends, such as a moist heat pack or a heating pad. If you have a removable assistive device, remove it as told by   your health care provider. Place a towel between your skin and the heat source. If your health care provider tells you to keep the assistive device on  while you apply heat, place a towel between the assistive device and the heat source. Leave the heat on for 20-30 minutes. Remove the heat if your skin turns bright red. This is especially important if you are unable to feel pain, heat, or cold. You may have a greater risk of getting burned. If directed, put ice on the affected area. To do this: If you have a removable assistive device, remove it as told by your health care provider. Put ice in a plastic bag. Place a towel between your skin and the bag. If your health care provider tells you to keep the assistive device on during icing, place a towel between the assistive device and the bag. Leave the ice on for 20 minutes, 2-3 times a day. Move your fingers or toes often to reduce stiffness and swelling. Raise (elevate) the injured area above the level of your heart while you are sitting or lying down. General instructions Take over-the-counter and prescription medicines only as told by your health care provider. Maintain a healthy weight. Follow instructions from your health care provider for weight control. Do not use any products that contain nicotine or tobacco, such as cigarettes, e-cigarettes, and chewing tobacco. If you need help quitting, ask your health care provider. Use assistive devices as told by your health care provider. Keep all follow-up visits as told by your health care provider. This is important. Where to find more information National Institute of Arthritis and Musculoskeletal and Skin Diseases: www.niams.nih.gov National Institute on Aging: www.nia.nih.gov American College of Rheumatology: www.rheumatology.org Contact a health care provider if: You have redness, swelling, or a feeling of warmth in a joint that gets worse. You have a fever along with joint or muscle aches. You develop a rash. You have trouble doing your normal activities. Get help right away if: You have pain that gets worse and is not relieved by  pain medicine. Summary Osteoarthritis is a type of arthritis that affects tissue covering the ends of bones in joints (cartilage). This condition is caused by the wearing down of cartilage that covers the ends of bones. The main symptom of this condition is pain, swelling, and stiffness in the joint. There is no cure for this condition, but treatment can help control pain and improve joint function. This information is not intended to replace advice given to you by your health care provider. Make sure you discuss any questions you have with your health care provider. Document Revised: 08/15/2019 Document Reviewed: 08/15/2019 Elsevier Patient Education  2022 [employer]  

## 2021-08-22 NOTE — Progress Notes (Signed)
Established Patient Office Visit  Subjective:  [name], female    DOB: [birth year]  Age: 80 y.o. MRN: [id number]  CC:  Chief Complaint  Patient presents with   Knee Pain    HPI [name] presents for discussion of home health physical therapy. Patient is accompanied by her daughter. Patient reports having left knee pain which has been chronic for many years which has worsened the past few months and affecting ambulation. Patient has been evaluated by orthopedics in the past and received knee injections (cortisone and gel). Applying cold ice packs and taking Tylenol have provided some relief. No redness or recent falls or injury.  Past Medical History:  Diagnosis Date   Arthritis    "hands, spine" (12/07/2014)   CHB (complete heart block) (HCC) 09/19/2013   CHF (congestive heart failure) (HCC) dx'd 12/06/2014   Chronic pain    "since neck OR in 2007"   Coronary artery disease    normal coronaries by 09/10/00 cath   Diabetes mellitus    hga1c 7.1 no meds   Dysrhythmia    HTN (hypertension) 09/19/2013   Hypercholesterolemia    Hyperlipidemia 09/19/2013   Hypertension    Myocardial infarction [facility]) dx'd 2009   Neuropathy of both feet    Pacemaker    Paralysis of right hand (HCC) 2007   "after spinal cord injury/OR"   Shortness of breath    exertional   Type II diabetes mellitus (HCC)    "had it years ago; lost weight after neck OR in 2007; glucose went way down; recently dx'd as returned recently" (12/07/2014)   Urinary incontinence    "since 2007's OR"   Venous insufficiency (chronic) (peripheral)     Past Surgical History:  Procedure Laterality Date   ANTERIOR CERVICAL DECOMP/DISCECTOMY FUSION  2007   BACK SURGERY     BIOPSY  07/29/2019   Procedure: BIOPSY;  Surgeon: Meryl Dare, MD;  Location: [facility] ENDOSCOPY;  Service: Endoscopy;;   CARDIAC CATHETERIZATION  09/10/2000   normal Coronary arteries, EF60%,    CARDIAC PACEMAKER PLACEMENT  12/01/2007    [employer] Zephyr Carlynn Purl #[id number], dual chamber   CARDIOVASCULAR STRESS TEST  08/28/2000   Cardiolite perfusion study, EF58%, low risk study,    DILATION AND CURETTAGE OF UTERUS  1960's   ESOPHAGOGASTRODUODENOSCOPY N/A 07/29/2019   Procedure: ESOPHAGOGASTRODUODENOSCOPY (EGD);  Surgeon: Meryl Dare, MD;  Location: [facility] ENDOSCOPY;  Service: Endoscopy;  Laterality: N/A;   HOT HEMOSTASIS N/A 07/29/2019   Procedure: HOT HEMOSTASIS (ARGON PLASMA COAGULATION/BICAP);  Surgeon: Meryl Dare, MD;  Location: [facility] ENDOSCOPY;  Service: Endoscopy;  Laterality: N/A;   KNEE ARTHROSCOPY Right    LAPAROSCOPIC CHOLECYSTECTOMY  1995   LEFT AND RIGHT HEART CATHETERIZATION WITH CORONARY ANGIOGRAM N/A 12/14/2014   Procedure: LEFT AND RIGHT HEART CATHETERIZATION WITH CORONARY ANGIOGRAM;  Surgeon: Peter M Swaziland, MD;  Location: [facility] CATH LAB;  Service: Cardiovascular;  Laterality: N/A;   Lower Ext. Dopplers  06/12/2011   no evidence of thrombus, all vessels normal in size, no evidence of insuffiency   LUMBAR LAMINECTOMY/DECOMPRESSION MICRODISCECTOMY  03/02/2012   Procedure: LUMBAR LAMINECTOMY/DECOMPRESSION MICRODISCECTOMY 2 LEVELS;  Surgeon: Karn Cassis, MD;  Location: [facility] NEURO ORS;  Service: Neurosurgery;  Laterality: Bilateral;  Lumbar three, lumbar four, lumbar five Laminectomy/Foraminotomy   MOLE REMOVAL     left side of face near her eye   NM MYOCAR PERF WALL MOTION  10/24/2008   protocol:Lexiscan, EF63%, normal study, scan negative for ischemia  PPM GENERATOR CHANGEOUT N/A 05/26/2018   Procedure: PPM GENERATOR CHANGEOUT;  Surgeon: Sanda Klein, MD;  Location: [facility] CV LAB;  Service: Cardiovascular;  Laterality: N/A;   TRANSTHORACIC ECHOCARDIOGRAM  11/30/2007   normal echo, mean transaortic valve gradient 43mmHg    Family History  Problem Relation Age of Onset   Diabetes Father    Heart disease Father        before age 71   Hyperlipidemia Father    Hypertension Father    Heart attack Father     Congestive Heart Failure Father    Diabetes Mother    Heart disease Mother    Hyperlipidemia Mother    Hypertension Mother    Congestive Heart Failure Mother    Diabetes Brother    Congestive Heart Failure Brother    Colon cancer Neg Hx    Esophageal cancer Neg Hx    Rectal cancer Neg Hx     Social History   Socioeconomic History   Marital status: Widowed    Spouse name: Not on file   Number of children: 2   Years of education: Not on file   Highest education level: Not on file  Occupational History   Occupation: Self employed- Academic librarian business -retired  Tobacco Use   Smoking status: Former    Packs/day: 0.50    Years: 32.00    Pack years: 16.00    Types: Cigarettes    Start date: 1963    Quit date: 03/02/1994    Years since quitting: 27.4   Smokeless tobacco: Never  Vaping Use   Vaping Use: Never used  Substance and Sexual Activity   Alcohol use: No   Drug use: No   Sexual activity: Not Currently    Birth control/protection: None  Other Topics Concern   Not on file  Social History Narrative   Not on file   Social Determinants of Health   Financial Resource Strain: Low Risk    Difficulty of Paying Living Expenses: Not hard at all  Food Insecurity: No Food Insecurity   Worried About Charity fundraiser in the Last Year: Never true   [place] in the Last Year: Never true  Transportation Needs: No Transportation Needs   Lack of Transportation (Medical): No   Lack of Transportation (Non-Medical): No  Physical Activity: Sufficiently Active   Days of Exercise per Week: 7 days   Minutes of Exercise per Session: 40 min  Stress: No Stress Concern Present   Feeling of Stress : Not at all  Social Connections: Unknown   Frequency of Communication with Friends and Family: More than three times a week   Frequency of Social Gatherings with Friends and Family: More than three times a week   Attends Religious Services: Never   Marine scientist or  Organizations: No   Attends Archivist Meetings: Never   Marital Status: Not on file  Intimate Partner Violence: Not on file    Outpatient Medications Prior to Visit  Medication Sig Dispense Refill   aspirin EC 81 MG tablet Take 81 mg by mouth daily.     furosemide (LASIX) 40 MG tablet TAKE 1 TABLET DAILY. MAY TAKE AN EXTRA TABLET FOR WEIGHT GAIN OF 2 LB IN A DAY & 5 LBS IN A WEEK 95 tablet 1   KLOR-CON M20 20 MEQ tablet TAKE 1 TABLET BY MOUTH EVERY DAY 90 tablet 0   acetaminophen (TYLENOL) 500 MG tablet Take 500 mg by mouth  every 6 (six) hours as needed for mild pain.      Nutritional Supplements (JUICE PLUS FIBRE PO) Take 3 tablets by mouth at bedtime. (Patient not taking: Reported on 08/22/2021)     OXYGEN 2lpm with sleep and exertion [employer]     pantoprazole (PROTONIX) 40 MG tablet Take 1 tablet (40 mg total) by mouth daily. (Patient not taking: Reported on 08/22/2021) 90 tablet 3   pravastatin (PRAVACHOL) 40 MG tablet Take 1 tablet (40 mg total) by mouth daily. (Patient not taking: Reported on 08/22/2021) 90 tablet 3   Respiratory Therapy Supplies (FLUTTER) DEVI Use flutter device three times a day as instructed 1 each 0   losartan (COZAAR) 50 MG tablet Take 50 mg by mouth daily. (Patient not taking: Reported on 08/22/2021)     No facility-administered medications prior to visit.    No Known Allergies  ROS Review of Systems Review of Systems:  A fourteen system review of systems was performed and found to be positive as per HPI.   Objective:    Physical Exam General:  Well Developed, well nourished, appropriate for stated age.  Neuro:  Alert and oriented,  extra-ocular muscles intact  HEENT:  Normocephalic, atraumatic, neck supple Skin:  no gross rash, warm, pink. Cardiac:  RRR, S1 S2 Respiratory:  CTA B/L  MSK: tenderness of lateral left knee with mild swelling, no deformity noted, limited ROM with flexion and extension of left knee, patellar reflex 2+ b/l,  negative Posterior and Anterior drawer tests, slow and unsteady gait w/o assistive device  Vascular:  Ext warm, no cyanosis apprec.; cap RF less 2 sec. Psych:  No HI/SI, judgement and insight good, Euthymic mood. Full Affect.  BP 138/82    Pulse 90    Temp 98.1 F (36.7 C)    Ht $R'5\' 2"'jR$  (1.575 m)    Wt 158 lb (71.7 kg)    SpO2 95%    BMI 28.90 kg/m  Wt Readings from Last 3 Encounters:  08/22/21 158 lb (71.7 kg)  01/16/21 147 lb 11.2 oz (67 kg)  08/02/20 143 lb (64.9 kg)     Health Maintenance Due  Topic Date Due   OPHTHALMOLOGY EXAM  Never done   TETANUS/TDAP  Never done   Zoster Vaccines- Shingrix (1 of 2) Never done   DEXA SCAN  Never done   Pneumonia Vaccine 65+ Years old (2 - PCV) 06/02/2007   FOOT EXAM  01/23/2021   COVID-19 Vaccine (5 - Booster) 02/07/2021   INFLUENZA VACCINE  04/01/2021    There are no preventive care reminders to display for this patient.  Lab Results  Component Value Date   TSH 1.910 04/25/2020   Lab Results  Component Value Date   WBC 8.4 08/22/2021   HGB 13.4 08/22/2021   HCT 40.7 08/22/2021   MCV 95 08/22/2021   PLT 175 08/22/2021   Lab Results  Component Value Date   NA 140 08/22/2021   K 4.9 08/22/2021   CO2 30 (H) 08/22/2021   GLUCOSE 121 (H) 08/22/2021   BUN 28 (H) 08/22/2021   CREATININE 0.53 (L) 08/22/2021   BILITOT 0.3 08/22/2021   ALKPHOS 59 08/22/2021   AST 14 08/22/2021   ALT 11 08/22/2021   PROT 6.4 08/22/2021   ALBUMIN 4.4 08/22/2021   CALCIUM 9.4 08/22/2021   ANIONGAP 9 08/01/2019   EGFR 92 08/22/2021   GFR 94.44 05/05/2019   Lab Results  Component Value Date   CHOL 180 12/31/2020   Lab  Results  Component Value Date   HDL 68 12/31/2020   Lab Results  Component Value Date   LDLCALC 93 12/31/2020   Lab Results  Component Value Date   TRIG 105 12/31/2020   Lab Results  Component Value Date   CHOLHDL 2.6 12/31/2020   Lab Results  Component Value Date   HGBA1C 6.1 (H) 08/22/2021      Assessment &  Plan:   Problem List Items Addressed This Visit       Endocrine   Diabetes mellitus type 2, controlled, without complications ([place])   Relevant Medications   losartan (COZAAR) 50 MG tablet   Other Relevant Orders   HgB A1c (Completed)   Other Visit Diagnoses     Chronic pain of left knee    -  Primary   Hypertension associated with diabetes ([place])       Relevant Medications   losartan (COZAAR) 50 MG tablet   Other Relevant Orders   Comp Met (CMET) (Completed)   CBC w/Diff (Completed)       Chronic pain of left knee: -Will place order for home health physical therapy to improve knee pain and strengthening exercises to reduce risk of falls, patient does not drive and transportation is limited. Advised patient if unable to afford home health PT then recommend to follow up with [facility], patient verbalized understanding and will let me know. -Recommend to continue with conservative therapy.    Hypertension associated with diabetes: -BP elevated in office, BP rechecked and improved. Patient's daughter states recent blood pressure readings have been elevated (176/85- this morning, 164/87- yesterday). Patient has been eating out more and likely consuming more sodium. Discussed resuming Losartan 50 mg and reducing sodium intake. Patient and daughter verbalized understanding. If blood pressure remains consistently >140/90 then recommend to notify the office for treatment adjustments. Will collect CMP to monitor renal function and electrolytes.  Diabetes mellitus type 2, controlled, without complication: -Will collect A1c with blood work. -Last A1c 6.1 (12/31/2020).   Of note, patient has been non-compliant with follow up visits for chronic management of type 2 diabetes mellitus, hypertension and hyperlipidemia. Advised to schedule follow-up visit.   Meds ordered this encounter  Medications   losartan (COZAAR) 50 MG tablet    Sig: Take 1 tablet (50 mg total) by mouth daily.     Dispense:  90 tablet    Refill:  0    Order Specific Question:   Supervising Provider    Answer:   Beatrice Lecher D [[id number]]    Follow-up: Return in about 3 months (around 11/20/2021) for DM, HTN.    Lorrene Reid, PA-C

## 2021-08-23 LAB — CBC WITH DIFFERENTIAL/PLATELET
Basophils Absolute: 0 10*3/uL (ref 0.0–0.2)
Basos: 0 %
EOS (ABSOLUTE): 0.1 10*3/uL (ref 0.0–0.4)
Eos: 1 %
Hematocrit: 40.7 % (ref 34.0–46.6)
Hemoglobin: 13.4 g/dL (ref 11.1–15.9)
Immature Grans (Abs): 0 10*3/uL (ref 0.0–0.1)
Immature Granulocytes: 0 %
Lymphocytes Absolute: 2.7 10*3/uL (ref 0.7–3.1)
Lymphs: 32 %
MCH: 31.2 pg (ref 26.6–33.0)
MCHC: 32.9 g/dL (ref 31.5–35.7)
MCV: 95 fL (ref 79–97)
Monocytes Absolute: 0.5 10*3/uL (ref 0.1–0.9)
Monocytes: 6 %
Neutrophils Absolute: 5.1 10*3/uL (ref 1.4–7.0)
Neutrophils: 61 %
Platelets: 175 10*3/uL (ref 150–450)
RBC: 4.3 x10E6/uL (ref 3.77–5.28)
RDW: 11.9 % (ref 11.7–15.4)
WBC: 8.4 10*3/uL (ref 3.4–10.8)

## 2021-08-23 LAB — COMPREHENSIVE METABOLIC PANEL
ALT: 11 IU/L (ref 0–32)
AST: 14 IU/L (ref 0–40)
Albumin/Globulin Ratio: 2.2 (ref 1.2–2.2)
Albumin: 4.4 g/dL (ref 3.6–4.6)
Alkaline Phosphatase: 59 IU/L (ref 44–121)
BUN/Creatinine Ratio: 53 — ABNORMAL HIGH (ref 12–28)
BUN: 28 mg/dL — ABNORMAL HIGH (ref 8–27)
Bilirubin Total: 0.3 mg/dL (ref 0.0–1.2)
CO2: 30 mmol/L — ABNORMAL HIGH (ref 20–29)
Calcium: 9.4 mg/dL (ref 8.7–10.3)
Chloride: 97 mmol/L (ref 96–106)
Creatinine, Ser: 0.53 mg/dL — ABNORMAL LOW (ref 0.57–1.00)
Globulin, Total: 2 g/dL (ref 1.5–4.5)
Glucose: 121 mg/dL — ABNORMAL HIGH (ref 70–99)
Potassium: 4.9 mmol/L (ref 3.5–5.2)
Sodium: 140 mmol/L (ref 134–144)
Total Protein: 6.4 g/dL (ref 6.0–8.5)
eGFR: 92 mL/min/{1.73_m2} (ref >59)

## 2021-08-23 LAB — HEMOGLOBIN A1C
Est. average glucose Bld gHb Est-mCnc: 128 mg/dL
Hgb A1c MFr Bld: 6.1 % — ABNORMAL HIGH (ref 4.8–5.6)

## 2021-09-04 ENCOUNTER — Ambulatory Visit (INDEPENDENT_AMBULATORY_CARE_PROVIDER_SITE_OTHER): Payer: Medicare Other

## 2021-09-04 DIAGNOSIS — I442 Atrioventricular block, complete: Secondary | ICD-10-CM | POA: Diagnosis not present

## 2021-09-04 LAB — CUP PACEART REMOTE DEVICE CHECK
Battery Remaining Longevity: 80 mo
Battery Remaining Percentage: 69 %
Battery Voltage: 2.99 V
Brady Statistic AP VP Percent: 32 %
Brady Statistic AP VS Percent: 1 %
Brady Statistic AS VP Percent: 67 %
Brady Statistic AS VS Percent: 1 %
Brady Statistic RA Percent Paced: 31 %
Brady Statistic RV Percent Paced: 99 %
Date Time Interrogation Session: 20230104020013
Implantable Lead Implant Date: 20090401
Implantable Lead Implant Date: 20090401
Implantable Lead Location: 753859
Implantable Lead Location: 753860
Implantable Pulse Generator Implant Date: 20190925
Lead Channel Impedance Value: 430 Ohm
Lead Channel Impedance Value: 430 Ohm
Lead Channel Pacing Threshold Amplitude: 0.75 V
Lead Channel Pacing Threshold Amplitude: 0.875 V
Lead Channel Pacing Threshold Pulse Width: 0.5 ms
Lead Channel Pacing Threshold Pulse Width: 0.5 ms
Lead Channel Sensing Intrinsic Amplitude: 1.8 mV
Lead Channel Sensing Intrinsic Amplitude: 12 mV
Lead Channel Setting Pacing Amplitude: 1.125
Lead Channel Setting Pacing Amplitude: 2 V
Lead Channel Setting Pacing Pulse Width: 0.5 ms
Lead Channel Setting Sensing Sensitivity: 4 mV
Pulse Gen Model: 2272
Pulse Gen Serial Number: 9064942

## 2021-09-09 DIAGNOSIS — M1712 Unilateral primary osteoarthritis, left knee: Secondary | ICD-10-CM | POA: Diagnosis not present

## 2021-09-09 DIAGNOSIS — M170 Bilateral primary osteoarthritis of knee: Secondary | ICD-10-CM | POA: Diagnosis not present

## 2021-09-16 NOTE — Progress Notes (Signed)
Remote pacemaker transmission.   

## 2021-09-25 ENCOUNTER — Other Ambulatory Visit: Payer: Self-pay | Admitting: Cardiovascular Disease

## 2021-10-01 NOTE — Progress Notes (Signed)
error 

## 2021-10-03 DIAGNOSIS — M1712 Unilateral primary osteoarthritis, left knee: Secondary | ICD-10-CM | POA: Diagnosis not present

## 2021-10-10 DIAGNOSIS — M1712 Unilateral primary osteoarthritis, left knee: Secondary | ICD-10-CM | POA: Diagnosis not present

## 2021-10-17 DIAGNOSIS — M1712 Unilateral primary osteoarthritis, left knee: Secondary | ICD-10-CM | POA: Diagnosis not present

## 2021-10-21 ENCOUNTER — Other Ambulatory Visit: Payer: Self-pay | Admitting: Cardiovascular Disease

## 2021-11-13 NOTE — Patient Outreach (Signed)
[place] [facility]) Care Management ? ?11/13/2021 ? ?LARIE MATHES ?1940/12/03 ?[id number] ? ? ?Received referral for Care Management from Insurance plan. Assigned patient to Deloria Lair, RN Care Coordinator for follow. ? ?Philmore Pali ?Bates Management Assistant ?[phone] ?  ?

## 2021-11-19 ENCOUNTER — Other Ambulatory Visit: Payer: Self-pay | Admitting: Physician Assistant

## 2021-11-19 DIAGNOSIS — I152 Hypertension secondary to endocrine disorders: Secondary | ICD-10-CM

## 2021-11-20 ENCOUNTER — Other Ambulatory Visit: Payer: Self-pay | Admitting: Cardiovascular Disease

## 2021-11-21 ENCOUNTER — Ambulatory Visit (INDEPENDENT_AMBULATORY_CARE_PROVIDER_SITE_OTHER): Payer: Medicare Other | Admitting: Physician Assistant

## 2021-11-21 ENCOUNTER — Encounter: Payer: Self-pay | Admitting: Physician Assistant

## 2021-11-21 ENCOUNTER — Other Ambulatory Visit: Payer: Self-pay | Admitting: *Deleted

## 2021-11-21 ENCOUNTER — Other Ambulatory Visit: Payer: Self-pay

## 2021-11-21 VITALS — BP 118/68 | HR 70 | Temp 98.7°F | Wt 151.0 lb

## 2021-11-21 DIAGNOSIS — E1159 Type 2 diabetes mellitus with other circulatory complications: Secondary | ICD-10-CM

## 2021-11-21 DIAGNOSIS — E1169 Type 2 diabetes mellitus with other specified complication: Secondary | ICD-10-CM

## 2021-11-21 DIAGNOSIS — I5042 Chronic combined systolic (congestive) and diastolic (congestive) heart failure: Secondary | ICD-10-CM | POA: Diagnosis not present

## 2021-11-21 DIAGNOSIS — E785 Hyperlipidemia, unspecified: Secondary | ICD-10-CM

## 2021-11-21 DIAGNOSIS — E119 Type 2 diabetes mellitus without complications: Secondary | ICD-10-CM | POA: Diagnosis not present

## 2021-11-21 DIAGNOSIS — I152 Hypertension secondary to endocrine disorders: Secondary | ICD-10-CM | POA: Diagnosis not present

## 2021-11-21 LAB — POCT GLYCOSYLATED HEMOGLOBIN (HGB A1C): Hemoglobin A1C: 6 % — AB (ref 4.0–5.6)

## 2021-11-21 MED ORDER — LOSARTAN POTASSIUM 100 MG PO TABS: 100.0000 mg | ORAL_TABLET | Freq: Every day | ORAL | 0 refills | Status: DC

## 2021-11-21 NOTE — Patient Instructions (Signed)
Managing Your Hypertension Hypertension, also called high blood pressure, is when the force of the blood pressing against the walls of the arteries is too strong. Arteries are blood vessels that carry blood from your heart throughout your body. Hypertension forces the heart to work harder to pump blood and may cause the arteries to become narrow or stiff. Understanding blood pressure readings Your personal target blood pressure may vary depending on your medical conditions, your age, and other factors. A blood pressure reading includes a higher number over a lower number. Ideally, your blood pressure should be below 120/80. You should know that: The first, or top, number is called the systolic pressure. It is a measure of the pressure in your arteries as your heart beats. The second, or bottom number, is called the diastolic pressure. It is a measure of the pressure in your arteries as the heart relaxes. Blood pressure is classified into four stages. Based on your blood pressure reading, your health care provider may use the following stages to determine what type of treatment you need, if any. Systolic pressure and diastolic pressure are measured in a unit called mmHg. Normal Systolic pressure: below 120. Diastolic pressure: below 80. Elevated Systolic pressure: 120-129. Diastolic pressure: below 80. Hypertension stage 1 Systolic pressure: 130-139. Diastolic pressure: 80-89. Hypertension stage 2 Systolic pressure: 140 or above. Diastolic pressure: 90 or above. How can this condition affect me? Managing your hypertension is an important responsibility. Over time, hypertension can damage the arteries and decrease blood flow to important parts of the body, including the brain, heart, and kidneys. Having untreated or uncontrolled hypertension can lead to: A heart attack. A stroke. A weakened blood vessel (aneurysm). Heart failure. Kidney damage. Eye damage. Metabolic syndrome. Memory and  concentration problems. Vascular dementia. What actions can I take to manage this condition? Hypertension can be managed by making lifestyle changes and possibly by taking medicines. Your health care provider will help you make a plan to bring your blood pressure within a normal range. Nutrition  Eat a diet that is high in fiber and potassium, and low in salt (sodium), added sugar, and fat. An example eating plan is called the Dietary Approaches to Stop Hypertension (DASH) diet. To eat this way: Eat plenty of fresh fruits and vegetables. Try to fill one-half of your plate at each meal with fruits and vegetables. Eat whole grains, such as whole-wheat pasta, brown rice, or whole-grain bread. Fill about one-fourth of your plate with whole grains. Eat low-fat dairy products. Avoid fatty cuts of meat, processed or cured meats, and poultry with skin. Fill about one-fourth of your plate with lean proteins such as fish, chicken without skin, beans, eggs, and tofu. Avoid pre-made and processed foods. These tend to be higher in sodium, added sugar, and fat. Reduce your daily sodium intake. Most people with hypertension should eat less than 1,500 mg of sodium a day. Lifestyle  Work with your health care provider to maintain a healthy body weight or to lose weight. Ask what an ideal weight is for you. Get at least 30 minutes of exercise that causes your heart to beat faster (aerobic exercise) most days of the week. Activities may include walking, swimming, or biking. Include exercise to strengthen your muscles (resistance exercise), such as weight lifting, as part of your weekly exercise routine. Try to do these types of exercises for 30 minutes at least 3 days a week. Do not use any products that contain nicotine or tobacco, such as cigarettes, e-cigarettes,   and chewing tobacco. If you need help quitting, ask your health care provider. Control any long-term (chronic) conditions you have, such as high  cholesterol or diabetes. Identify your sources of stress and find ways to manage stress. This may include meditation, deep breathing, or making time for fun activities. Alcohol use Do not drink alcohol if: Your health care provider tells you not to drink. You are pregnant, may be pregnant, or are planning to become pregnant. If you drink alcohol: Limit how much you use to: 0-1 drink a day for women. 0-2 drinks a day for men. Be aware of how much alcohol is in your drink. In the [place] one drink equals one 12 oz bottle of beer (355 mL), one 5 oz glass of wine (148 mL), or one 1 oz glass of hard liquor (44 mL). Medicines Your health care provider may prescribe medicine if lifestyle changes are not enough to get your blood pressure under control and if: Your systolic blood pressure is 130 or higher. Your diastolic blood pressure is 80 or higher. Take medicines only as told by your health care provider. Follow the directions carefully. Blood pressure medicines must be taken as told by your health care provider. The medicine does not work as well when you skip doses. Skipping doses also puts you at risk for problems. Monitoring Before you monitor your blood pressure: Do not smoke, drink caffeinated beverages, or exercise within 30 minutes before taking a measurement. Use the bathroom and empty your bladder (urinate). Sit quietly for at least 5 minutes before taking measurements. Monitor your blood pressure at home as told by your health care provider. To do this: Sit with your back straight and supported. Place your feet flat on the floor. Do not cross your legs. Support your arm on a flat surface, such as a table. Make sure your upper arm is at heart level. Each time you measure, take two or three readings one minute apart and record the results. You may also need to have your blood pressure checked regularly by your health care provider. General information Talk with your health care  provider about your diet, exercise habits, and other lifestyle factors that may be contributing to hypertension. Review all the medicines you take with your health care provider because there may be side effects or interactions. Keep all visits as told by your health care provider. Your health care provider can help you create and adjust your plan for managing your high blood pressure. Where to find more information National Heart, Lung, and Blood Institute: www.nhlbi.nih.gov American Heart Association: www.heart.org Contact a health care provider if: You think you are having a reaction to medicines you have taken. You have repeated (recurrent) headaches. You feel dizzy. You have swelling in your ankles. You have trouble with your vision. Get help right away if: You develop a severe headache or confusion. You have unusual weakness or numbness, or you feel faint. You have severe pain in your chest or abdomen. You vomit repeatedly. You have trouble breathing. These symptoms may represent a serious problem that is an emergency. Do not wait to see if the symptoms will go away. Get medical help right away. Call your local emergency services (911 in the [place] Do not drive yourself to the hospital. Summary Hypertension is when the force of blood pumping through your arteries is too strong. If this condition is not controlled, it may put you at risk for serious complications. Your personal target blood pressure may vary depending on   your medical conditions, your age, and other factors. For most people, a normal blood pressure is less than 120/80. Hypertension is managed by lifestyle changes, medicines, or both. Lifestyle changes to help manage hypertension include losing weight, eating a healthy, low-sodium diet, exercising more, stopping smoking, and limiting alcohol. This information is not intended to replace advice given to you by your health care provider. Make sure you discuss any questions  you have with your health care provider. Document Revised: 09/05/2019 Document Reviewed: 07/19/2019 Elsevier Patient Education  2022 Elsevier Inc.  

## 2021-11-21 NOTE — Assessment & Plan Note (Signed)
-  Followed by cardiology. ?-Euvolemic on exam.  ?-Patient on diuretic therapy and Losartan. ?

## 2021-11-21 NOTE — Patient Outreach (Signed)
[place] [facility]) Care Management ? ?11/21/2021 ? ?[name] ?1940-10-18 ? ?Telephone outreach for care management services. [name] said she did not feel like she needs the service as her family looks after her very well. ? ?Advised I will send a letter for future reference. ? ?Daughter called NP and she feels like our services could be very helpful. She says her mother has HF and goes out to eat a lot and this always causes a wt increase. She would like some information to share with the family so that they will understand their actions are counterproductive in keeping their loved one out of the hospital. Will send HF Action Plan and Low Salt Visual. Will call back in 2 weeks. ? ?[name], MSN, GNP-BC ?Gerontological Nurse Practitioner ?[facility] Care Management ?[phone] ? ? ?

## 2021-11-21 NOTE — Assessment & Plan Note (Signed)
-  Diet controlled. A1c today 6.0, decreased from 6.1. ?-Discussed diabetic diet. ?-Continue ambulatory glucose monitoring. ?-Will continue to monitor. ?

## 2021-11-21 NOTE — Progress Notes (Signed)
?Established patient visit ? ? ?Patient: [name]   DOB: Dec 25, 1940   81 y.o. Female  MRN: [id number] ?Visit Date: 11/21/2021 ? ?Chief Complaint  ?Patient presents with  ? Follow-up  ? Diabetes  ? Hypertension  ? ?Subjective  ?  ?HPI  ?Patient presents for follow-up on diabetes mellitus, hypertension and hyperlipidemia. Patient is accompanied by her daughter.  ? ?Diabetes mellitus: Pt denies increased urination or thirst. Pt reports medication compliance. No hypoglycemic events. Checking glucose at home. FBS range 120-139. ? ?HTN: Pt denies chest pain, palpitations, dizziness or lower extremity swelling. Patient was restarted on Losartan 50 mg. Taking medication as directed without side effects. Checks BP at home and readings range 160s/80-90s. Pt reports trying to watch sodium intake but still eating out.  ? ?HLD: Pt trying to manage with diet. Reports eating out several times per week such as [employer].  ? ? ?Medications: ?Outpatient Medications Prior to Visit  ?Medication Sig  ? aspirin EC 81 MG tablet Take 81 mg by mouth daily.  ? furosemide (LASIX) 40 MG tablet TAKE 1 TABLET DAILY. MAY TAKE AN EXTRA TABLET FOR WEIGHT GAIN OF 2 LB IN A DAY & 5 LBS IN A WEEK  ? KLOR-CON M20 20 MEQ tablet Take 1 tablet (20 mEq total) by mouth daily. Schedule an appointment with Dr.[name] for future refills. (FINAL ATTEMPT)  ? Nutritional Supplements (JUICE PLUS FIBRE PO) Take 3 tablets by mouth at bedtime.  ? OXYGEN 2lpm with sleep and exertion ?[employer]  ? pantoprazole (PROTONIX) 40 MG tablet Take 1 tablet (40 mg total) by mouth daily.  ? pravastatin (PRAVACHOL) 40 MG tablet Take 1 tablet (40 mg total) by mouth daily.  ? Respiratory Therapy Supplies (FLUTTER) DEVI Use flutter device three times a day as instructed  ? [DISCONTINUED] losartan (COZAAR) 50 MG tablet TAKE 1 TABLET BY MOUTH EVERY DAY  ? ?No facility-administered medications prior to visit.  ? ? ?Review of Systems ?Review of Systems:  ?A fourteen system  review of systems was performed and found to be positive as per HPI. ? ? ?  Objective  ?  ?BP 118/68   Pulse 70   Temp 98.7 ?F (37.1 ?C)   Wt 151 lb (68.5 kg)   SpO2 92%   BMI 27.62 kg/m?  ?BP Readings from Last 3 Encounters:  ?11/21/21 118/68  ?08/22/21 138/82  ?03/12/20 114/64  ? ?Wt Readings from Last 3 Encounters:  ?11/21/21 151 lb (68.5 kg)  ?08/22/21 158 lb (71.7 kg)  ?01/16/21 147 lb 11.2 oz (67 kg)  ? ? ?Physical Exam  ?General:  Well Developed, well nourished, appropriate for stated age.  ?Neuro:  Alert and oriented,  extra-ocular muscles intact  ?HEENT:  Normocephalic, atraumatic, neck supple  ?Skin:  no gross rash, warm, pink. ?Cardiac:  RRR, S1 S2,no murmur  ?Respiratory: CTA B/L  ?Vascular:  Ext warm, no cyanosis apprec.; cap RF less 2 sec. No edema. ?Psych:  No HI/SI, judgement and insight good, Euthymic mood. Full Affect. ? ? ?Results for orders placed or performed in visit on 11/21/21  ?POCT glycosylated hemoglobin (Hb A1C)  ?Result Value Ref Range  ? Hemoglobin A1C 6.0 (A) 4.0 - 5.6 %  ? HbA1c POC (<> result, manual entry)    ? HbA1c, POC (prediabetic range)    ? HbA1c, POC (controlled diabetic range)    ? ? Assessment & Plan  ?  ? ? ?Problem List Items Addressed This Visit   ? ?  ? Cardiovascular  and Mediastinum  ? Chronic combined systolic and diastolic CHF (congestive heart failure) ([place])  ?  -Followed by cardiology. ?-Euvolemic on exam.  ?-Patient on diuretic therapy and Losartan. ?  ?  ? Relevant Medications  ? losartan (COZAAR) 100 MG tablet  ? Hypertension associated with diabetes ([place])  ?  -BP elevated, repeated and improved. Ambulatory BP readings remain elevated, will increase Losartan to 100 mg. Will repeat CMP for medication monitoring with lab visit and will repeat BP as well. Recommend to continue with ambulatory BP monitoring and keep a log to bring to next OV. Discussed DASH diet.  ?  ?  ? Relevant Medications  ? losartan (COZAAR) 100 MG tablet  ?  ? Endocrine  ? Diabetes  mellitus type 2, controlled, without complications ([place]) - Primary  ?  -Diet controlled. A1c today 6.0, decreased from 6.1. ?-Discussed diabetic diet. ?-Continue ambulatory glucose monitoring. ?-Will continue to monitor. ?  ?  ? Relevant Medications  ? losartan (COZAAR) 100 MG tablet  ? Other Relevant Orders  ? POCT glycosylated hemoglobin (Hb A1C) (Completed)  ? Hyperlipidemia associated with type 2 diabetes mellitus ([place])  ?  -Last lipid panel: HDL 68, LDL 93 (goal <70). ?-Patient managing with diet but has been eating out more. Recommend to schedule lab visit for fasting blood work to repeat lipid panel. If LDL remains above 70 then will recommend resuming pravastatin 40 mg. Pt and daughter verbalized understanding. Discussed low fat diet.  ?  ?  ? Relevant Medications  ? losartan (COZAAR) 100 MG tablet  ? ? ?Return in about 4 months (around 03/23/2022) for DM, HTN, HLD; lab visit 2-4 weeks for FBW (not A1c) and BP recheck  .  ?   ? ? ? ?Lorrene Reid, PA-C  ?[facility] ?[phone] (phone) ?[phone] (fax) ? ?[facility] ?

## 2021-11-21 NOTE — Assessment & Plan Note (Signed)
-  BP elevated, repeated and improved. Ambulatory BP readings remain elevated, will increase Losartan to 100 mg. Will repeat CMP for medication monitoring with lab visit and will repeat BP as well. Recommend to continue with ambulatory BP monitoring and keep a log to bring to next OV. Discussed DASH diet.  ?

## 2021-11-21 NOTE — Assessment & Plan Note (Signed)
-  Last lipid panel: HDL 68, LDL 93 (goal <70). ?-Patient managing with diet but has been eating out more. Recommend to schedule lab visit for fasting blood work to repeat lipid panel. If LDL remains above 70 then will recommend resuming pravastatin 40 mg. Pt and daughter verbalized understanding. Discussed low fat diet.  ?

## 2021-11-26 DIAGNOSIS — Z20822 Contact with and (suspected) exposure to covid-19: Secondary | ICD-10-CM | POA: Diagnosis not present

## 2021-11-28 DIAGNOSIS — M1712 Unilateral primary osteoarthritis, left knee: Secondary | ICD-10-CM | POA: Diagnosis not present

## 2021-11-29 ENCOUNTER — Telehealth: Payer: Self-pay | Admitting: Physician Assistant

## 2021-11-29 ENCOUNTER — Other Ambulatory Visit: Payer: Self-pay | Admitting: Cardiovascular Disease

## 2021-11-29 NOTE — Telephone Encounter (Signed)
Patients daughter called to let you know patient has not been taking her pravastatin and also has not been taking the klor-con. Daughter is going to get the klor-con filled but wants to know if she needs to start back taking the pravastatin. Also she is not taking the Protonix that GI prescribed for her and she just wanted you to be aware. I explained to her she would need to contact GI regarding that. Otila Kluver [phone] ?

## 2021-11-29 NOTE — Telephone Encounter (Signed)
[name] and gave the PCP recommendation.  ?

## 2021-12-03 ENCOUNTER — Encounter: Payer: Self-pay | Admitting: *Deleted

## 2021-12-03 ENCOUNTER — Other Ambulatory Visit: Payer: Self-pay

## 2021-12-03 DIAGNOSIS — Z1329 Encounter for screening for other suspected endocrine disorder: Secondary | ICD-10-CM

## 2021-12-03 DIAGNOSIS — I152 Hypertension secondary to endocrine disorders: Secondary | ICD-10-CM

## 2021-12-03 DIAGNOSIS — D500 Iron deficiency anemia secondary to blood loss (chronic): Secondary | ICD-10-CM

## 2021-12-03 DIAGNOSIS — E1169 Type 2 diabetes mellitus with other specified complication: Secondary | ICD-10-CM

## 2021-12-04 ENCOUNTER — Ambulatory Visit (INDEPENDENT_AMBULATORY_CARE_PROVIDER_SITE_OTHER): Payer: Medicare Other

## 2021-12-04 DIAGNOSIS — I442 Atrioventricular block, complete: Secondary | ICD-10-CM | POA: Diagnosis not present

## 2021-12-04 LAB — CUP PACEART REMOTE DEVICE CHECK
Battery Remaining Longevity: 79 mo
Battery Remaining Percentage: 67 %
Battery Voltage: 2.99 V
Brady Statistic AP VP Percent: 33 %
Brady Statistic AP VS Percent: 1 %
Brady Statistic AS VP Percent: 66 %
Brady Statistic AS VS Percent: 1 %
Brady Statistic RA Percent Paced: 31 %
Brady Statistic RV Percent Paced: 99 %
Date Time Interrogation Session: 20230405020013
Implantable Lead Implant Date: 20090401
Implantable Lead Implant Date: 20090401
Implantable Lead Location: 753859
Implantable Lead Location: 753860
Implantable Pulse Generator Implant Date: 20190925
Lead Channel Impedance Value: 430 Ohm
Lead Channel Impedance Value: 440 Ohm
Lead Channel Pacing Threshold Amplitude: 0.75 V
Lead Channel Pacing Threshold Amplitude: 0.75 V
Lead Channel Pacing Threshold Pulse Width: 0.5 ms
Lead Channel Pacing Threshold Pulse Width: 0.5 ms
Lead Channel Sensing Intrinsic Amplitude: 12 mV
Lead Channel Sensing Intrinsic Amplitude: 2.3 mV
Lead Channel Setting Pacing Amplitude: 1 V
Lead Channel Setting Pacing Amplitude: 2 V
Lead Channel Setting Pacing Pulse Width: 0.5 ms
Lead Channel Setting Sensing Sensitivity: 4 mV
Pulse Gen Model: 2272
Pulse Gen Serial Number: 9064942

## 2021-12-05 ENCOUNTER — Other Ambulatory Visit: Payer: Self-pay | Admitting: *Deleted

## 2021-12-05 ENCOUNTER — Other Ambulatory Visit: Payer: Medicare Other

## 2021-12-05 DIAGNOSIS — E785 Hyperlipidemia, unspecified: Secondary | ICD-10-CM

## 2021-12-05 DIAGNOSIS — E1169 Type 2 diabetes mellitus with other specified complication: Secondary | ICD-10-CM | POA: Diagnosis not present

## 2021-12-05 DIAGNOSIS — E1159 Type 2 diabetes mellitus with other circulatory complications: Secondary | ICD-10-CM | POA: Diagnosis not present

## 2021-12-05 DIAGNOSIS — Z1329 Encounter for screening for other suspected endocrine disorder: Secondary | ICD-10-CM | POA: Diagnosis not present

## 2021-12-05 DIAGNOSIS — D500 Iron deficiency anemia secondary to blood loss (chronic): Secondary | ICD-10-CM | POA: Diagnosis not present

## 2021-12-05 DIAGNOSIS — I152 Hypertension secondary to endocrine disorders: Secondary | ICD-10-CM

## 2021-12-05 NOTE — Patient Outreach (Signed)
[facility]) Care Management ? ?12/05/2021 ? ?JAMIYAH DINGLEY ?07/02/1941 ?[id number] ? ?Telephone outreach to see if pt's daughter would like to have [facility] [facility] services for her mother. She is not sure they received the information and will check and call back. ? ?Otila Kluver called back and says she cannot lay her finger on the information and asked for it to be resent to her address. NP to request new letter with enclosures. ? ?Eulah Pont. Khalel Alms, MSN, GNP-BC ?Gerontological Nurse Practitioner ?[facility] Care Management ?[phone] ? ? ?

## 2021-12-06 LAB — CBC WITH DIFFERENTIAL/PLATELET
Basophils Absolute: 0 10*3/uL (ref 0.0–0.2)
Basos: 0 %
EOS (ABSOLUTE): 0.1 10*3/uL (ref 0.0–0.4)
Eos: 1 %
Hematocrit: 41.1 % (ref 34.0–46.6)
Hemoglobin: 13.5 g/dL (ref 11.1–15.9)
Immature Grans (Abs): 0 10*3/uL (ref 0.0–0.1)
Immature Granulocytes: 0 %
Lymphocytes Absolute: 2.8 10*3/uL (ref 0.7–3.1)
Lymphs: 38 %
MCH: 31 pg (ref 26.6–33.0)
MCHC: 32.8 g/dL (ref 31.5–35.7)
MCV: 94 fL (ref 79–97)
Monocytes Absolute: 0.4 10*3/uL (ref 0.1–0.9)
Monocytes: 5 %
Neutrophils Absolute: 4.1 10*3/uL (ref 1.4–7.0)
Neutrophils: 56 %
Platelets: 153 10*3/uL (ref 150–450)
RBC: 4.36 x10E6/uL (ref 3.77–5.28)
RDW: 12.3 % (ref 11.7–15.4)
WBC: 7.4 10*3/uL (ref 3.4–10.8)

## 2021-12-06 LAB — COMPREHENSIVE METABOLIC PANEL
ALT: 12 IU/L (ref 0–32)
AST: 16 IU/L (ref 0–40)
Albumin/Globulin Ratio: 2.3 — ABNORMAL HIGH (ref 1.2–2.2)
Albumin: 4.6 g/dL (ref 3.6–4.6)
Alkaline Phosphatase: 45 IU/L (ref 44–121)
BUN/Creatinine Ratio: 45 — ABNORMAL HIGH (ref 12–28)
BUN: 21 mg/dL (ref 8–27)
Bilirubin Total: 0.6 mg/dL (ref 0.0–1.2)
CO2: 27 mmol/L (ref 20–29)
Calcium: 9.2 mg/dL (ref 8.7–10.3)
Chloride: 101 mmol/L (ref 96–106)
Creatinine, Ser: 0.47 mg/dL — ABNORMAL LOW (ref 0.57–1.00)
Globulin, Total: 2 g/dL (ref 1.5–4.5)
Glucose: 104 mg/dL — ABNORMAL HIGH (ref 70–99)
Potassium: 4.9 mmol/L (ref 3.5–5.2)
Sodium: 142 mmol/L (ref 134–144)
Total Protein: 6.6 g/dL (ref 6.0–8.5)
eGFR: 95 mL/min/{1.73_m2} (ref >59)

## 2021-12-06 LAB — LIPID PANEL
Chol/HDL Ratio: 2.5 ratio (ref 0.0–4.4)
Cholesterol, Total: 181 mg/dL (ref 100–199)
HDL: 73 mg/dL (ref >39)
LDL Chol Calc (NIH): 92 mg/dL (ref 0–99)
Triglycerides: 88 mg/dL (ref 0–149)
VLDL Cholesterol Cal: 16 mg/dL (ref 5–40)

## 2021-12-06 LAB — TSH: TSH: 1.43 u[IU]/mL (ref 0.450–4.500)

## 2021-12-09 ENCOUNTER — Other Ambulatory Visit: Payer: Self-pay

## 2021-12-09 DIAGNOSIS — E1169 Type 2 diabetes mellitus with other specified complication: Secondary | ICD-10-CM

## 2021-12-09 MED ORDER — PRAVASTATIN SODIUM 40 MG PO TABS: 40.0000 mg | ORAL_TABLET | Freq: Every day | ORAL | 1 refills | Status: DC

## 2021-12-19 ENCOUNTER — Other Ambulatory Visit: Payer: Self-pay | Admitting: Cardiovascular Disease

## 2021-12-19 DIAGNOSIS — Z20822 Contact with and (suspected) exposure to covid-19: Secondary | ICD-10-CM | POA: Diagnosis not present

## 2021-12-19 NOTE — Progress Notes (Signed)
Remote pacemaker transmission.   

## 2021-12-23 DIAGNOSIS — Z23 Encounter for immunization: Secondary | ICD-10-CM | POA: Diagnosis not present

## 2021-12-27 ENCOUNTER — Other Ambulatory Visit: Payer: Self-pay | Admitting: Cardiovascular Disease

## 2022-01-08 ENCOUNTER — Other Ambulatory Visit: Payer: Self-pay | Admitting: Cardiovascular Disease

## 2022-01-13 ENCOUNTER — Other Ambulatory Visit: Payer: Self-pay | Admitting: Cardiovascular Disease

## 2022-01-21 ENCOUNTER — Other Ambulatory Visit: Payer: Self-pay | Admitting: Cardiovascular Disease

## 2022-02-07 ENCOUNTER — Other Ambulatory Visit: Payer: Self-pay | Admitting: Physician Assistant

## 2022-02-07 DIAGNOSIS — I152 Hypertension secondary to endocrine disorders: Secondary | ICD-10-CM

## 2022-03-05 ENCOUNTER — Ambulatory Visit (INDEPENDENT_AMBULATORY_CARE_PROVIDER_SITE_OTHER): Payer: Medicare Other

## 2022-03-05 DIAGNOSIS — I442 Atrioventricular block, complete: Secondary | ICD-10-CM

## 2022-03-07 LAB — CUP PACEART REMOTE DEVICE CHECK
Battery Remaining Longevity: 76 mo
Battery Remaining Percentage: 64 %
Battery Voltage: 2.99 V
Brady Statistic AP VP Percent: 32 %
Brady Statistic AP VS Percent: 1 %
Brady Statistic AS VP Percent: 66 %
Brady Statistic AS VS Percent: 1 %
Brady Statistic RA Percent Paced: 31 %
Brady Statistic RV Percent Paced: 99 %
Date Time Interrogation Session: 20230705023147
Implantable Lead Implant Date: 20090401
Implantable Lead Implant Date: 20090401
Implantable Lead Location: 753859
Implantable Lead Location: 753860
Implantable Pulse Generator Implant Date: 20190925
Lead Channel Impedance Value: 410 Ohm
Lead Channel Impedance Value: 410 Ohm
Lead Channel Pacing Threshold Amplitude: 0.625 V
Lead Channel Pacing Threshold Amplitude: 0.75 V
Lead Channel Pacing Threshold Pulse Width: 0.5 ms
Lead Channel Pacing Threshold Pulse Width: 0.5 ms
Lead Channel Sensing Intrinsic Amplitude: 12 mV
Lead Channel Sensing Intrinsic Amplitude: 2 mV
Lead Channel Setting Pacing Amplitude: 0.875
Lead Channel Setting Pacing Amplitude: 2 V
Lead Channel Setting Pacing Pulse Width: 0.5 ms
Lead Channel Setting Sensing Sensitivity: 4 mV
Pulse Gen Model: 2272
Pulse Gen Serial Number: 9064942

## 2022-03-26 NOTE — Progress Notes (Signed)
Remote pacemaker transmission.   

## 2022-03-27 ENCOUNTER — Other Ambulatory Visit: Payer: Self-pay | Admitting: Cardiovascular Disease

## 2022-04-01 ENCOUNTER — Ambulatory Visit: Payer: Self-pay

## 2022-04-01 NOTE — Patient Outreach (Signed)
  Care Coordination   Initial Visit Note   04/01/2022 Name: [name] MRN: [id number] DOB: June 08, 1941  [name] is a 81 y.o. year old female who sees Lorrene Reid, [place] for primary care. I spoke with [name], patients daughter by phone today  What matters to the patients health and wellness today?  Pt has a history of DM, HTN.   Goals Addressed   None     SDOH assessments and interventions completed:   No- Patients daughter requests call back after her Mom's PCP appointment on 04/07/22.   Care Coordination Interventions Activated:  No Care Coordination Interventions:  Yes, provided- agreed to call patient/daughter back after 04/07/22 PCP visit.  Follow up plan: Follow up call scheduled for call after 04/07/22 appointment  Encounter Outcome:  Pt. Request to Call Back  Johnney Killian, RN, BSN, CCM Care Management Coordinator [facility] Phone: [phone]: [phone]

## 2022-04-07 ENCOUNTER — Ambulatory Visit (INDEPENDENT_AMBULATORY_CARE_PROVIDER_SITE_OTHER): Payer: Medicare Other | Admitting: Physician Assistant

## 2022-04-07 ENCOUNTER — Encounter: Payer: Self-pay | Admitting: Physician Assistant

## 2022-04-07 VITALS — BP 143/80 | HR 69 | Temp 98.2°F | Ht 65.0 in | Wt 167.0 lb

## 2022-04-07 DIAGNOSIS — R635 Abnormal weight gain: Secondary | ICD-10-CM | POA: Diagnosis not present

## 2022-04-07 DIAGNOSIS — E1159 Type 2 diabetes mellitus with other circulatory complications: Secondary | ICD-10-CM

## 2022-04-07 DIAGNOSIS — E119 Type 2 diabetes mellitus without complications: Secondary | ICD-10-CM

## 2022-04-07 DIAGNOSIS — I152 Hypertension secondary to endocrine disorders: Secondary | ICD-10-CM

## 2022-04-07 DIAGNOSIS — E1169 Type 2 diabetes mellitus with other specified complication: Secondary | ICD-10-CM

## 2022-04-07 DIAGNOSIS — I5042 Chronic combined systolic (congestive) and diastolic (congestive) heart failure: Secondary | ICD-10-CM | POA: Diagnosis not present

## 2022-04-07 DIAGNOSIS — R0602 Shortness of breath: Secondary | ICD-10-CM | POA: Diagnosis not present

## 2022-04-07 DIAGNOSIS — E785 Hyperlipidemia, unspecified: Secondary | ICD-10-CM

## 2022-04-07 LAB — POCT GLYCOSYLATED HEMOGLOBIN (HGB A1C): Hemoglobin A1C: 5.9 % — AB (ref 4.0–5.6)

## 2022-04-07 MED ORDER — PRAVASTATIN SODIUM 40 MG PO TABS: 40.0000 mg | ORAL_TABLET | Freq: Every day | ORAL | 1 refills | Status: DC

## 2022-04-07 MED ORDER — ALBUTEROL SULFATE HFA 108 (90 BASE) MCG/ACT IN AERS
2.0000 | INHALATION_SPRAY | Freq: Four times a day (QID) | RESPIRATORY_TRACT | 2 refills | Status: AC | PRN
Start: 2022-04-07 — End: ?

## 2022-04-07 NOTE — Progress Notes (Unsigned)
Established patient visit   Patient: [name]   DOB: July 27, 1941   81 y.o. Female  MRN: [id number] Visit Date: 04/07/2022  Chief Complaint  Patient presents with   Follow-up   Subjective    HPI  Patient presents for chronic follow-up. Patient is accompanied by her daughter. Patient complaining of shortness of breath with couple of steps and then feels like she has to rest. Patient's daughter has noticed some chest congestion. No fever, cough, chills or confusion. Uses oxygen at night, pt unsure of how many liters.  Diabetes: No increased urination or thirst. Pt reports medication compliance. No hypoglycemic events. Checking glucose at home. FBS range from 123-124 with few readings in 140-150s.  HTN: Pt denies chest pain, palpitations, dizziness or lower extremity swelling. Taking medication as directed without side effects. Checks BP at home and readings range in (today) 153/71, (yesterday) 153/72. Readings consistently >140/90. Patient reports trying to monitor her salt intake.   HLD: Pt did not restart pravastatin. Patient's daughter states pt has been eating more fast food and out.    Medications: Outpatient Medications Prior to Visit  Medication Sig   aspirin EC 81 MG tablet Take 81 mg by mouth daily.   furosemide (LASIX) 40 MG tablet TAKE 1 TABLET DAILY. MAY TAKE AN EXTRA TABLET FOR WEIGHT GAIN OF 2 LB IN A DAY & 5 LBS IN A WEEK   KLOR-CON M20 20 MEQ tablet TAKE 1 TABLET (20 MEQ TOTAL) BY MOUTH DAILY. PATIENT MUST KEEP SCHEDULE APPOINTMENT FOR FUTURE REFILLS   losartan (COZAAR) 100 MG tablet TAKE 1 TABLET BY MOUTH EVERY DAY   Nutritional Supplements (JUICE PLUS FIBRE PO) Take 3 tablets by mouth at bedtime.   OXYGEN 2lpm with sleep and exertion [employer]   Respiratory Therapy Supplies (FLUTTER) DEVI Use flutter device three times a day as instructed   pantoprazole (PROTONIX) 40 MG tablet Take 1 tablet (40 mg total) by mouth daily. (Patient not taking: Reported on 04/07/2022)    [DISCONTINUED] pravastatin (PRAVACHOL) 40 MG tablet Take 1 tablet (40 mg total) by mouth daily. (Patient not taking: Reported on 04/07/2022)   No facility-administered medications prior to visit.    Review of Systems Review of Systems:  A fourteen system review of systems was performed and found to be positive as per HPI.  {Labs  Heme  Chem  Endocrine  Serology  Results Review (optional):23779}   Objective    BP (!) 143/80   Pulse 69   Temp 98.2 F (36.8 C) (Temporal)   Ht 5\' 5"  (1.651 m)   Wt 167 lb (75.8 kg)   BMI 27.79 kg/m  BP Readings from Last 3 Encounters:  04/07/22 (!) 143/80  12/05/21 126/76  11/21/21 118/68   Wt Readings from Last 3 Encounters:  04/07/22 167 lb (75.8 kg)  11/21/21 151 lb (68.5 kg)  08/22/21 158 lb (71.7 kg)    Physical Exam  General:  Well Developed, well nourished, appropriate for stated age.  Neuro:  Alert and oriented,  extra-ocular muscles intact  HEENT:  Normocephalic, atraumatic, neck supple  Skin:  no gross rash, warm, pink. Cardiac:  RRR, S1 S2 Respiratory: Mild expiratory wheezing noted, no rhonchi, crackles or rales.  Vascular:  Ext warm, no cyanosis apprec.; cap RF less 2 sec. No pitting edema. Psych:  No HI/SI, judgement and insight good, Euthymic mood. Full Affect.   Results for orders placed or performed in visit on 04/07/22  POCT HgB A1C  Result Value Ref Range  Hemoglobin A1C 5.9 (A) 4.0 - 5.6 %   HbA1c POC (<> result, manual entry)     HbA1c, POC (prediabetic range)     HbA1c, POC (controlled diabetic range)      Assessment & Plan     *** Problem List Items Addressed This Visit       Cardiovascular and Mediastinum   Chronic combined systolic and diastolic CHF (congestive heart failure) (HCC)   Relevant Medications   pravastatin (PRAVACHOL) 40 MG tablet   Other Relevant Orders   B Nat Peptide   Hypertension associated with diabetes ([place])   Relevant Medications   pravastatin (PRAVACHOL) 40 MG tablet    Other Relevant Orders   CBC w/Diff   Comp Met (CMET)     Endocrine   Diabetes mellitus type 2, controlled, without complications (H[facility])   Relevant Medications   pravastatin (PRAVACHOL) 40 MG tablet   Other Relevant Orders   POCT HgB A1C (Completed)   Hyperlipidemia associated with type 2 diabetes mellitus ([place]) - Primary   Relevant Medications   pravastatin (PRAVACHOL) 40 MG tablet   Other Relevant Orders   POCT HgB A1C (Completed)   CBC w/Diff   Lipid Profile     Other   Dyspnea   Other Visit Diagnoses     Weight gain       Relevant Orders   TSH   Comp Met (CMET)   B Nat Peptide       Return in about 3 months (around 07/08/2022) for HTN, HLD, DM; lab visit for FBW include BNP this week not A1c.        Lorrene Reid, PA-C  [facility] at [facility] [phone] (phone) [phone] (fax)  [place]

## 2022-04-07 NOTE — Patient Instructions (Signed)
Shortness of Breath, Adult Shortness of breath means you have trouble breathing. Shortness of breath could be a sign of a medical problem. Follow these instructions at home:  Pollution Do not smoke or use any products that contain nicotine or tobacco. If you need help quitting, ask your doctor. Avoid things that can make it harder to breathe, such as: Smoke of all kinds. This includes smoke from campfires or forest fires. Do not smoke or allow others to smoke in your home. Mold. Dust. Air pollution. Chemical smells. Things that can give you an allergic reaction (allergens) if you have allergies. Keep your living space clean. Use products that help remove mold and dust. General instructions Watch for any changes in your symptoms. Take over-the-counter and prescription medicines only as told by your doctor. This includes oxygen therapy and inhaled medicines. Rest as needed. Return to your normal activities when your doctor says that it is safe. Keep all follow-up visits. Contact a doctor if: Your condition does not get better as soon as expected. You have a hard time doing your normal activities, even after you rest. You have new symptoms. You cannot walk up stairs. You cannot exercise the way you normally do. Get help right away if: Your shortness of breath gets worse. You have trouble breathing when you are resting. You feel light-headed or you faint. You have a cough that is not helped by medicines. You cough up blood. You have pain with breathing. You have pain in your chest, arms, shoulders, or belly (abdomen). You have a fever. These symptoms may be an emergency. Get help right away. Call 911. Do not wait to see if the symptoms will go away. Do not drive yourself to the hospital. Summary Shortness of breath is when you have trouble breathing enough air. It can be a sign of a medical problem. Avoid things that make it hard for you to breathe, such as smoking, pollution,  mold, and dust. Watch for any changes in your symptoms. Contact your doctor if you do not get better or you get worse. This information is not intended to replace advice given to you by your health care provider. Make sure you discuss any questions you have with your health care provider. Document Revised: 04/06/2021 Document Reviewed: 04/06/2021 Elsevier Patient Education  2023 Elsevier Inc.  

## 2022-04-08 NOTE — Assessment & Plan Note (Signed)
-  Last lipid panel LDL was above goal (less than 70) at 92. Patient did not resume pravastatin as advised and advised to restart. Sent rx. Will continue to monitor.

## 2022-04-08 NOTE — Assessment & Plan Note (Addendum)
-  No pitting edema on exam but patient has gained 16 pounds since last visit. Will obtain BNP with lab visit to evaluate for signs of acute CHF. Discussed with patient low sodium diet. On exam mild expiratory wheezing noted, pt does have a hx of COPD, provided rx for albuterol inhaler to use as needed for shortness of breath. Continue supplemental oxygen (2L). If symptoms fail to improve or worsen recommend further evaluation.

## 2022-04-08 NOTE — Assessment & Plan Note (Signed)
-  BP above goal today. Patient reports compliance with Losartan 100 mg, pt's daughter unsure so will double check at home. Advised patient if has been compliant with Losartan then recommend adding CCB such as amlodipine 2.5 mg. Patient and daughter verbalized understanding. Discussed low sodium diet and provided handout on DASH diet. Will continue to monitor.

## 2022-04-08 NOTE — Assessment & Plan Note (Signed)
-  Diet controlled. A1c today 5.9, remains at goal. Will continue to monitor. Will plan to obtain urine microalbumin with lab visit. Continue ambulatory glucose monitoring.

## 2022-04-09 ENCOUNTER — Other Ambulatory Visit: Payer: Medicare Other

## 2022-04-09 DIAGNOSIS — E1169 Type 2 diabetes mellitus with other specified complication: Secondary | ICD-10-CM | POA: Diagnosis not present

## 2022-04-09 DIAGNOSIS — E785 Hyperlipidemia, unspecified: Secondary | ICD-10-CM | POA: Diagnosis not present

## 2022-04-09 DIAGNOSIS — I152 Hypertension secondary to endocrine disorders: Secondary | ICD-10-CM | POA: Diagnosis not present

## 2022-04-09 DIAGNOSIS — I5042 Chronic combined systolic (congestive) and diastolic (congestive) heart failure: Secondary | ICD-10-CM

## 2022-04-09 DIAGNOSIS — R635 Abnormal weight gain: Secondary | ICD-10-CM | POA: Diagnosis not present

## 2022-04-09 DIAGNOSIS — E1159 Type 2 diabetes mellitus with other circulatory complications: Secondary | ICD-10-CM

## 2022-04-10 LAB — COMPREHENSIVE METABOLIC PANEL
ALT: 14 IU/L (ref 0–32)
AST: 15 IU/L (ref 0–40)
Albumin/Globulin Ratio: 2.1 (ref 1.2–2.2)
Albumin: 4.5 g/dL (ref 3.7–4.7)
Alkaline Phosphatase: 53 IU/L (ref 44–121)
BUN/Creatinine Ratio: 30 — ABNORMAL HIGH (ref 12–28)
BUN: 18 mg/dL (ref 8–27)
Bilirubin Total: 0.7 mg/dL (ref 0.0–1.2)
CO2: 28 mmol/L (ref 20–29)
Calcium: 9.4 mg/dL (ref 8.7–10.3)
Chloride: 96 mmol/L (ref 96–106)
Creatinine, Ser: 0.61 mg/dL (ref 0.57–1.00)
Globulin, Total: 2.1 g/dL (ref 1.5–4.5)
Glucose: 120 mg/dL — ABNORMAL HIGH (ref 70–99)
Potassium: 4.5 mmol/L (ref 3.5–5.2)
Sodium: 136 mmol/L (ref 134–144)
Total Protein: 6.6 g/dL (ref 6.0–8.5)
eGFR: 89 mL/min/{1.73_m2} (ref >59)

## 2022-04-10 LAB — CBC WITH DIFFERENTIAL/PLATELET
Basophils Absolute: 0 10*3/uL (ref 0.0–0.2)
Basos: 0 %
EOS (ABSOLUTE): 0.1 10*3/uL (ref 0.0–0.4)
Eos: 2 %
Hematocrit: 40.8 % (ref 34.0–46.6)
Hemoglobin: 13.7 g/dL (ref 11.1–15.9)
Immature Grans (Abs): 0 10*3/uL (ref 0.0–0.1)
Immature Granulocytes: 0 %
Lymphocytes Absolute: 2.3 10*3/uL (ref 0.7–3.1)
Lymphs: 32 %
MCH: 31.6 pg (ref 26.6–33.0)
MCHC: 33.6 g/dL (ref 31.5–35.7)
MCV: 94 fL (ref 79–97)
Monocytes Absolute: 0.4 10*3/uL (ref 0.1–0.9)
Monocytes: 6 %
Neutrophils Absolute: 4.3 10*3/uL (ref 1.4–7.0)
Neutrophils: 60 %
Platelets: 164 10*3/uL (ref 150–450)
RBC: 4.33 x10E6/uL (ref 3.77–5.28)
RDW: 11.9 % (ref 11.7–15.4)
WBC: 7.1 10*3/uL (ref 3.4–10.8)

## 2022-04-10 LAB — TSH: TSH: 1.68 u[IU]/mL (ref 0.450–4.500)

## 2022-04-10 LAB — LIPID PANEL
Chol/HDL Ratio: 2.9 ratio (ref 0.0–4.4)
Cholesterol, Total: 199 mg/dL (ref 100–199)
HDL: 69 mg/dL (ref >39)
LDL Chol Calc (NIH): 109 mg/dL — ABNORMAL HIGH (ref 0–99)
Triglycerides: 123 mg/dL (ref 0–149)
VLDL Cholesterol Cal: 21 mg/dL (ref 5–40)

## 2022-04-10 LAB — BRAIN NATRIURETIC PEPTIDE: BNP: 75.4 pg/mL (ref 0.0–100.0)

## 2022-04-21 ENCOUNTER — Telehealth: Payer: Self-pay | Admitting: Cardiovascular Disease

## 2022-04-21 NOTE — Telephone Encounter (Signed)
Patient reports 16 pound wt gain from 11/21/21 to 04/07/22. She takes lasix '40mg'$  daily and an extra prn for daily or weekly gain. For sob, she uses inhaler and O2 @ 2L/m at night. She denies chest pain or PND. She requested appointment to discuss medication. Current labs in Epic. Appointment made with Arnold Long for 8/25.

## 2022-04-21 NOTE — Telephone Encounter (Signed)
  Pt c/o medication issue:  1. Name of Medication: furosemide (LASIX) 40 MG tablet  2. How are you currently taking this medication (dosage and times per day)?  TAKE 1 TABLET DAILY. MAY TAKE AN EXTRA TABLET FOR WEIGHT GAIN OF 2 LB IN A DAY & 5 LBS IN A WEEK  3. Are you having a reaction (difficulty breathing--STAT)? No   4. What is your medication issue? Pt said, she had a 16 lbs weight gained from 03/23 to 08/07. Her pcp recommended for her to call Dr. Loletha Grayer to see if she needs to change her fluid pill dose. She said, she doesn't have any swelling

## 2022-04-24 NOTE — Progress Notes (Signed)
Cardiology Clinic Note   Patient Name: [name] Date of Encounter: 04/25/2022  Primary Care Provider:  Lorrene Reid, PA-C Primary Cardiologist:  Tammy Klein, MD  Patient Profile    81 year old female with history of complete heart block status post [facility] pacemaker implanted in 2009 with a generator change in 2019, it is a [employer] an is pacemaker dependent, also history of chronic diastolic heart failure, hypertension, oxygen dependent respiratory insufficiency with COPD, anemia with upper endoscopy showing 2 ulcers in the duodenal bulb although they were not actively bleeding they were cauterized and she was diagnosed with H. pylori, aspirin was discontinued.    The patient did have a left heart catheterization in 2016 which showed 30% stenosis at the takeoff of the first diagonal artery, mild pulmonary hypertension (38/16).  Coronary CT angiography in 2020 revealed moderately elevated calcium score at the 62nd percentile for age and gender with moderate 50 to 69% lesions in the LAD, with no evidence of significant obstruction by FFR.  Last seen by [name] on 03/12/2020. Most recent PPM check 03/05/2022.   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   "after spinal cord injury/OR"   Shortness of breath    exertional   Type II diabetes mellitus ([place])    "had it years ago; lost weight after neck OR in 2007; glucose went way  down; recently dx'd as returned recently" (12/07/2014)   Urinary incontinence    "since 2007's OR"   Venous insufficiency (chronic) (peripheral)    Past Surgical History:  Procedure Laterality Date   ANTERIOR CERVICAL DECOMP/DISCECTOMY FUSION  2007   BACK SURGERY     BIOPSY  07/29/2019   Procedure: BIOPSY;  Surgeon: Tammy Artist, MD;  Location: [place];  Service: Endoscopy;;   CARDIAC CATHETERIZATION  09/10/2000   normal Coronary arteries, EF60%,    CARDIAC PACEMAKER PLACEMENT  12/01/2007   [employer]ude Zephyr Elta [place] #[id number], dual chamber   CARDIOVASCULAR STRESS TEST  08/28/2000   Cardiolite perfusion study, EF58%, low risk study,    DILATION AND CURETTAGE OF UTERUS  1960's   ESOPHAGOGASTRODUODENOSCOPY N/A 07/29/2019   Procedure: ESOPHAGOGASTRODUODENOSCOPY (EGD);  Surgeon: Tammy Artist, MD;  Location: [facility] ENDOSCOPY;  Service: Endoscopy;  Laterality: N/A;   HOT HEMOSTASIS N/A 07/29/2019   Procedure: HOT HEMOSTASIS (ARGON PLASMA COAGULATION/BICAP);  Surgeon: Tammy Artist, MD;  Location: [facility] ENDOSCOPY;  Service: Endoscopy;  Laterality: N/A;   KNEE ARTHROSCOPY Right    LAPAROSCOPIC CHOLECYSTECTOMY  1995   LEFT AND RIGHT HEART CATHETERIZATION WITH CORONARY ANGIOGRAM N/A 12/14/2014   Procedure: LEFT AND RIGHT HEART CATHETERIZATION WITH CORONARY ANGIOGRAM;  Surgeon: Tammy M Martinique, MD;  Location: [facility] CATH LAB;  Service: Cardiovascular;  Laterality: N/A;   Lower Ext. Dopplers  06/12/2011   no evidence of thrombus, all vessels normal in  size, no evidence of insuffiency   LUMBAR LAMINECTOMY/DECOMPRESSION MICRODISCECTOMY  03/02/2012   Procedure: LUMBAR LAMINECTOMY/DECOMPRESSION MICRODISCECTOMY 2 LEVELS;  Surgeon: Tammy Stakes, MD;  Location: [place] NEURO ORS;  Service: Neurosurgery;  Laterality: Bilateral;  Lumbar three, lumbar four, lumbar five Laminectomy/Foraminotomy   MOLE REMOVAL     left side of face near her eye   NM MYOCAR PERF WALL MOTION  10/24/2008   protocol:Lexiscan, EF63%,  normal study, scan negative for ischemia   PPM GENERATOR CHANGEOUT N/A 05/26/2018   Procedure: PPM GENERATOR CHANGEOUT;  Surgeon: Tammy Klein, MD;  Location: [place] CV LAB;  Service: Cardiovascular;  Laterality: N/A;   TRANSTHORACIC ECHOCARDIOGRAM  11/30/2007   normal echo, mean transaortic valve gradient 55mHg    Allergies  No Known Allergies  History of Present Illness    [name] today for ongoing assessment and management of diastolic heart failure, hypertension, history of PAH, and permanent pacemaker in situ.  She states that the pacemaker has become uncomfortable under her skin.  She has lost some weight in her upper body and the skin has begun to sag.  The pacemaker pocket is uncomfortable to her and she rubs it repeatedly.  She states it is irritating to her and she cannot wear a bra due to this.  She offers no frank pain, edema, or migration of pacemaker within the pocket.  Her daughter who is with her is concerned about a 16 pound weight gain since being seen in our office approximately 2 years ago.  On reviewing her weights in March 2023 her weight was 151 pounds, and it is now 167 pounds.  Her daughter states that she is not maintaining a low-sodium diet, nor she taking extra Lasix for weight gain.  She is also eating out a lot with some friends.  [name] any edema in the lower extremities, early satiety, or abdominal distention.  She has gained some weight and states that her abdomen is heavier.  She continues to take Lasix 40 mg daily and states she is getting a good response, along with taking potassium.  She has been seen by her primary care provider who is given her an inhaler of albuterol to use as needed for shortness of breath.  She denies any PND orthopnea, however her bed is elevated at the head and she does sleep on a large pillow.  She does wear oxygen at night via nasal cannula.  She has not seen pulmonology in 2 years.  She denies  significant dyspnea on exertion but her daughter states that she has noticed it more lately.  She is also been coughing a lot.  She has been seen by her PCP who did full blood work-up and she was not found to have an infection, BNP was 75.4.  There was no evidence of anemia.  Home Medications    Current Outpatient Medications  Medication Sig Dispense Refill   albuterol (VENTOLIN HFA) 108 (90 Base) MCG/ACT inhaler Inhale 2 puffs into the lungs every 6 (six) hours as needed for wheezing or shortness of breath. 8 g 2   aspirin EC 81 MG tablet Take 81 mg by mouth daily.     budesonide-formoterol (SYMBICORT) 80-4.5 MCG/ACT inhaler Inhale 2 puffs into the lungs in the morning and at bedtime. 1 each 12   furosemide (LASIX) 40 MG tablet TAKE 1 TABLET DAILY. MAY TAKE AN EXTRA TABLET FOR WEIGHT GAIN OF 2 LB IN A DAY & 5 LBS IN A WEEK 180  tablet 1   KLOR-CON M20 20 MEQ tablet TAKE 1 TABLET (20 MEQ TOTAL) BY MOUTH DAILY. PATIENT MUST KEEP SCHEDULE APPOINTMENT FOR FUTURE REFILLS 60 tablet 0   losartan (COZAAR) 100 MG tablet TAKE 1 TABLET BY MOUTH EVERY DAY 90 tablet 0   Nutritional Supplements (JUICE PLUS FIBRE PO) Take 3 tablets by mouth at bedtime.     OXYGEN 2lpm with sleep and exertion [employer]     pantoprazole (PROTONIX) 40 MG tablet Take 1 tablet (40 mg total) by mouth daily. 90 tablet 3   pravastatin (PRAVACHOL) 40 MG tablet Take 1 tablet (40 mg total) by mouth daily. 90 tablet 1   Respiratory Therapy Supplies (FLUTTER) DEVI Use flutter device three times a day as instructed 1 each 0   No current facility-administered medications for this visit.     Family History    Family History  Problem Relation Age of Onset   Diabetes Father    Heart disease Father        before age 44   Hyperlipidemia Father    Hypertension Father    Heart attack Father    Congestive Heart Failure Father    Diabetes Mother    Heart disease Mother    Hyperlipidemia Mother    Hypertension Mother    Congestive  Heart Failure Mother    Diabetes Brother    Congestive Heart Failure Brother    Colon cancer Neg Hx    Esophageal cancer Neg Hx    Rectal cancer Neg Hx    She indicated that her mother is deceased. She indicated that her father is deceased. She indicated that her sister is deceased. She indicated that the status of her brother is unknown. She indicated that the status of her neg hx is unknown.  Social History    Social History   Socioeconomic History   Marital status: Widowed    Spouse name: Not on file   Number of children: 2   Years of education: Not on file   Highest education level: Not on file  Occupational History   Occupation: Self employed- Academic librarian business -retired  Tobacco Use   Smoking status: Former    Packs/day: 0.50    Years: 32.00    Total pack years: 16.00    Types: Cigarettes    Start date: 1963    Quit date: 03/02/1994    Years since quitting: 28.1   Smokeless tobacco: Never  Vaping Use   Vaping Use: Never used  Substance and Sexual Activity   Alcohol use: No   Drug use: No   Sexual activity: Not Currently    Birth control/protection: None  Other Topics Concern   Not on file  Social History Narrative   Not on file   Social Determinants of Health   Financial Resource Strain: Low Risk  (05/30/2021)   Overall Financial Resource Strain (CARDIA)    Difficulty of Paying Living Expenses: Not hard at all  Food Insecurity: No Food Insecurity (05/30/2021)   Hunger Vital Sign    Worried About Running Out of Food in the Last Year: Never true    [place] in the Last Year: Never true  Transportation Needs: No Transportation Needs (05/30/2021)   PRAPARE - Hydrologist (Medical): No    Lack of Transportation (Non-Medical): No  Physical Activity: Sufficiently Active (05/30/2021)   Exercise Vital Sign    Days of Exercise per Week: 7 days    Minutes of  Exercise per Session: 40 min  Stress: No Stress Concern Present (05/30/2021)    [place]    Feeling of Stress : Not at all  Social Connections: Unknown (05/30/2021)   Social Connection and Isolation Panel [NHANES]    Frequency of Communication with Friends and Family: More than three times a week    Frequency of Social Gatherings with Friends and Family: More than three times a week    Attends Religious Services: Never    Marine scientist or Organizations: No    Attends Music therapist: Never    Marital Status: Not on file  Intimate Partner Violence: Not on file     Review of Systems    General:  No chills, fever, night sweats or weight changes.  Cardiovascular:  No chest pain, dyspnea on exertion, edema, orthopnea, palpitations, paroxysmal nocturnal dyspnea. Dermatological: No rash, lesions/masses Respiratory: No cough, dyspnea Urologic: No hematuria, dysuria Abdominal:   No nausea, vomiting, diarrhea, bright red blood per rectum, melena, or hematemesis Neurologic:  No visual changes, wkns, changes in mental status. All other systems reviewed and are otherwise negative except as noted above.     Physical Exam    VS:  BP 128/70   Pulse 70   Ht '5\' 4"'$  (1.626 m)   Wt 167 lb 9.6 oz (76 kg)   SpO2 94%   BMI 28.77 kg/m  , BMI Body mass index is 28.77 kg/m.     GEN: Well nourished, well developed, in no acute distress. HEENT: normal. Neck: Supple, no JVD, carotid bruits, or masses. Cardiac: RRR, 2/6 systolic murmurs, no rubs, or gallops. No clubbing, cyanosis, edema.  Radials/DP/PT 2+ and equal bilaterally.  Respiratory:  Respirations regular and unlabored, clear to auscultation bilaterally.  No wheezing, but frequent coughing with inspiration GI: Soft, nontender, nondistended, BS + x 4. MS: no deformity or atrophy.  Pacemaker pocket appears secure without evidence of migration or stretching of the skin. Skin: warm and dry, no rash. Neuro:  Strength and sensation are  intact. Psych: Normal affect.  Accessory Clinical Findings    ECG personally reviewed by me today-AV pacing, heart rate of 70 bpm.- No acute changes  Lab Results  Component Value Date   WBC 7.1 04/09/2022   HGB 13.7 04/09/2022   HCT 40.8 04/09/2022   MCV 94 04/09/2022   PLT 164 04/09/2022   Lab Results  Component Value Date   CREATININE 0.61 04/09/2022   BUN 18 04/09/2022   NA 136 04/09/2022   K 4.5 04/09/2022   CL 96 04/09/2022   CO2 28 04/09/2022   Lab Results  Component Value Date   ALT 14 04/09/2022   AST 15 04/09/2022   ALKPHOS 53 04/09/2022   BILITOT 0.7 04/09/2022   Lab Results  Component Value Date   CHOL 199 04/09/2022   HDL 69 04/09/2022   LDLCALC 109 (H) 04/09/2022   LDLDIRECT 76 07/18/2019   TRIG 123 04/09/2022   CHOLHDL 2.9 04/09/2022    Lab Results  Component Value Date   HGBA1C 5.9 (A) 04/07/2022    Review of Prior Studies: Cardiac CTA 06/05/2019 IMPRESSION: 1. Coronary calcium score of 179. This was 23 percentile for age and sex matched control.   2. Normal coronary origin with right dominance.   3. Nonobstructive CAD. There is calcified plaque in the mid LAD causing moderate (50-69%) stenosis. CT-FFR across this lesion is 0.85, suggesting lesion is not functionally significant.  The plaque extends into the ostium of a diagonal branch, causing moderate (50-69%) stenosis. CT-FFR across the diagonal branch is 0.84, suggesting no functional significance   4. There is step artifact in the mid RCA. While no plaque is seen in this segment, definitive interpretation is precluded by artifact   CAD-RADS 3. Moderate stenosis. Consider symptom-guided anti-ischemic pharmacotherapy as well as risk factor modification per guideline directed care. Additional analysis with CT FFR will be submitted.    Assessment & Plan   1.  Coronary artery disease: Per CTA in 2020 there was calcified plaque noted in the right coronary artery at the ostium, there  were calcified plaque seen in the proximal LAD, mid LAD and into the ostium of the first diagonal branch causing less than 70% stenosis.  Currently being treated medically.  Uncertain if her breathing status is related to worsening or progression of CAD.  Can consider repeating nuclear medicine study for evaluation of ischemic areas.  For now I will recheck an echocardiogram for changes in LV function which may also be affecting her overall status.  Ongoing to see her back in a month for reevaluation.  2.  COPD with oxygen dependent at at bedtime.  Would recommend that she follows up with pulmonary for ongoing management.  She has been placed on albuterol rescue inhaler to use every 6 hours as needed shortness of breath.  She states that she only uses it once a day.  Her daughter states that her breathing seems to be more of an issue and she is concerned.  I will place her on Symbicort 80-4.52 puffs twice daily which may help with overall breathing status and reduce inflammation.  She will however need to follow-up with pulmonology for further management and for renewal of oxygen which she is using at nighttime.  3.  HFrEF: Most recent echocardiogram 05/13/2019 revealed an EF of 45 to 50% with elevated left atrial and left ventricular end-diastolic pressures with mild elevation in RV systolic pressure.  She is on Lasix 40 mg daily and states she is getting good results.  She is eating a good bit of salty foods and going out to eat a lot.  Some of this may be related to the 16 pound weight gain.  When I look back in her records her weight has been elevated from 11/21/2021 at 151 pounds to 04/07/2022 at 167 pounds.    I am not seeing evidence of volume overload but will increase her Lasix to 60 mg in the morning x1 on an empty stomach to see if there is increased diuresis.  I have also advised her to take Lasix 40 mg daily on an empty stomach for better bioavailability.  May need to consider changing to torsemide.   I did review her CMET and kidney function is stable.  BNP was normal.  Repeating echocardiogram for changes in LV function.  She is advised to reduce the salt in her intake and has been given a salty 6 pamphlet and also advised to avoid eating out.  4.  Pacemaker in situ: She complains that the pacemaker under her skin is irritating.  She rubs it a lot and feels it is uncomfortable when she wears her bra.  I do not see any migration.  With upper body weight loss this may be uncomfortable for her.  I have advised her to stop rubbing the site to avoid chafing.  She will follow-up with [name] for in person pacemaker check as scheduled.  Otherwise continue remote.  5.  Hypertension: Currently well controlled on medication regimen of losartan 100 mg daily.  Recent labs have been reviewed and kidney function is normal.  6.  Hypercholesterolemia: Remains on pravastatin 40 mg daily with a goal of LDL less than 70.  LDL was 109 on 04/09/2022.  We will consider adding Zetia or changing to atorvastatin on follow-up.  Current medicines are reviewed at length with the patient today.  I have spent 45 min's  dedicated to the care of this patient on the date of this encounter to include pre-visit review of records, assessment, management and diagnostic testing,with shared decision making.  Signed, Phill Myron. West Pugh, ANP, [place]   04/25/2022 4:29 PM      Office [phone] Fax [phone]  Notice: This dictation was prepared with Dragon dictation along with smaller phrase technology. Any transcriptional errors that result from this process are unintentional and may not be corrected upon review.

## 2022-04-25 ENCOUNTER — Encounter: Payer: Self-pay | Admitting: Adult Health

## 2022-04-25 ENCOUNTER — Ambulatory Visit (INDEPENDENT_AMBULATORY_CARE_PROVIDER_SITE_OTHER): Payer: Medicare Other | Admitting: Adult Health

## 2022-04-25 VITALS — BP 128/70 | HR 70 | Ht 64.0 in | Wt 167.6 lb

## 2022-04-25 DIAGNOSIS — E7800 Pure hypercholesterolemia, unspecified: Secondary | ICD-10-CM

## 2022-04-25 DIAGNOSIS — I2510 Atherosclerotic heart disease of native coronary artery without angina pectoris: Secondary | ICD-10-CM | POA: Diagnosis not present

## 2022-04-25 DIAGNOSIS — Z953 Presence of xenogenic heart valve: Secondary | ICD-10-CM | POA: Diagnosis not present

## 2022-04-25 DIAGNOSIS — I10 Essential (primary) hypertension: Secondary | ICD-10-CM

## 2022-04-25 DIAGNOSIS — K279 Peptic ulcer, site unspecified, unspecified as acute or chronic, without hemorrhage or perforation: Secondary | ICD-10-CM | POA: Diagnosis not present

## 2022-04-25 DIAGNOSIS — I2721 Secondary pulmonary arterial hypertension: Secondary | ICD-10-CM

## 2022-04-25 DIAGNOSIS — I442 Atrioventricular block, complete: Secondary | ICD-10-CM

## 2022-04-25 DIAGNOSIS — Z950 Presence of cardiac pacemaker: Secondary | ICD-10-CM | POA: Diagnosis not present

## 2022-04-25 MED ORDER — BUDESONIDE-FORMOTEROL FUMARATE 80-4.5 MCG/ACT IN AERO: 2.0000 | INHALATION_SPRAY | Freq: Two times a day (BID) | RESPIRATORY_TRACT | 12 refills | Status: DC

## 2022-04-25 NOTE — Patient Instructions (Signed)
Medication Instructions:  Your physician has recommended you make the following change in your medication START: Symbicort inhaler 2 puffs in the morning and 2 puffs at bedtime.  TOMORROW ONLY: Take '60mg'$  (1.5 tablets)  of LASIX on an EMPTY stomach then starting SUNDAY go back to taking your regular '40mg'$  dosage daily on an EMPTY stomach.  *If you need a refill on your cardiac medications before your next appointment, please call your pharmacy*   Lab Work: NONE If you have labs (blood work) drawn today and your tests are completely normal, you will receive your results only by: [place] (if you have MyChart) OR A paper copy in the mail If you have any lab test that is abnormal or we need to change your treatment, we will call you to review the results.   Testing/Procedures: Your physician has requested that you have an echocardiogram. Echocardiography is a painless test that uses sound waves to create images of your heart. It provides your doctor with information about the size and shape of your heart and how well your heart's chambers and valves are working. This procedure takes approximately one hour. There are no restrictions for this procedure.    Follow-Up: At [facility], you and your health needs are our priority.  As part of our continuing mission to provide you with exceptional heart care, we have created designated Provider Care Teams.  These Care Teams include your primary Cardiologist (physician) and Advanced Practice Providers (APPs -  Physician Assistants and Nurse Practitioners) who all work together to provide you with the care you need, when you need it.  We recommend signing up for the patient portal called "MyChart".  Sign up information is provided on this After Visit Summary.  MyChart is used to connect with patients for Virtual Visits (Telemedicine).  Patients are able to view lab/test results, encounter notes, upcoming appointments, etc.  Non-urgent messages can  be sent to your provider as well.   To learn more about what you can do with MyChart, go to [email or web address].    Your next appointment:   1 month(s)  The format for your next appointment:   In Person  Provider:   Sanda Klein, MD OR with Jory Sims, NP

## 2022-04-29 DIAGNOSIS — I781 Nevus, non-neoplastic: Secondary | ICD-10-CM | POA: Diagnosis not present

## 2022-04-29 DIAGNOSIS — T148XXA Other injury of unspecified body region, initial encounter: Secondary | ICD-10-CM | POA: Diagnosis not present

## 2022-04-29 DIAGNOSIS — D485 Neoplasm of uncertain behavior of skin: Secondary | ICD-10-CM | POA: Diagnosis not present

## 2022-04-29 DIAGNOSIS — D225 Melanocytic nevi of trunk: Secondary | ICD-10-CM | POA: Diagnosis not present

## 2022-04-30 ENCOUNTER — Other Ambulatory Visit: Payer: Self-pay | Admitting: Cardiovascular Disease

## 2022-05-12 ENCOUNTER — Ambulatory Visit (HOSPITAL_COMMUNITY): Payer: Medicare Other | Attending: Adult Health

## 2022-05-12 DIAGNOSIS — I10 Essential (primary) hypertension: Secondary | ICD-10-CM | POA: Diagnosis not present

## 2022-05-12 LAB — ECHOCARDIOGRAM COMPLETE
AR max vel: 1.41 cm2
AV Area VTI: 1.37 cm2
AV Area mean vel: 1.52 cm2
AV Mean grad: 14 mmHg
AV Peak grad: 25.4 mmHg
Ao pk vel: 2.52 m/s
Area-P 1/2: 2.36 cm2
P 1/2 time: 484 msec
S' Lateral: 2.9 cm

## 2022-05-16 ENCOUNTER — Other Ambulatory Visit: Payer: Self-pay | Admitting: Physician Assistant

## 2022-05-16 ENCOUNTER — Telehealth: Payer: Self-pay

## 2022-05-16 DIAGNOSIS — I152 Hypertension secondary to endocrine disorders: Secondary | ICD-10-CM

## 2022-05-16 NOTE — Telephone Encounter (Addendum)
Called patient regarding results. Spoke with patients daughter Otila Kluver. Patients daughter had understanding of results.----- Message from Lendon Colonel, NP sent at 05/15/2022 10:10 AM EDT ----- I have reviewed her echo report. Improvement is seen in the pumping function of her heart compared to previous echo.  Still has some aortic valve regurgitation but not worsening, with some thickening of the aortic valve which has not worsened. Good report.

## 2022-05-30 DIAGNOSIS — I781 Nevus, non-neoplastic: Secondary | ICD-10-CM | POA: Diagnosis not present

## 2022-05-30 DIAGNOSIS — L821 Other seborrheic keratosis: Secondary | ICD-10-CM | POA: Diagnosis not present

## 2022-06-04 ENCOUNTER — Ambulatory Visit (INDEPENDENT_AMBULATORY_CARE_PROVIDER_SITE_OTHER): Payer: Medicare Other

## 2022-06-04 DIAGNOSIS — I442 Atrioventricular block, complete: Secondary | ICD-10-CM

## 2022-06-04 DIAGNOSIS — Z23 Encounter for immunization: Secondary | ICD-10-CM | POA: Diagnosis not present

## 2022-06-04 LAB — CUP PACEART REMOTE DEVICE CHECK
Battery Remaining Longevity: 73 mo
Battery Remaining Percentage: 61 %
Battery Voltage: 2.99 V
Brady Statistic AP VP Percent: 31 %
Brady Statistic AP VS Percent: 1 %
Brady Statistic AS VP Percent: 67 %
Brady Statistic AS VS Percent: 1 %
Brady Statistic RA Percent Paced: 30 %
Brady Statistic RV Percent Paced: 98 %
Date Time Interrogation Session: 20231004031145
Implantable Lead Implant Date: 20090401
Implantable Lead Implant Date: 20090401
Implantable Lead Location: 753859
Implantable Lead Location: 753860
Implantable Pulse Generator Implant Date: 20190925
Lead Channel Impedance Value: 440 Ohm
Lead Channel Impedance Value: 440 Ohm
Lead Channel Pacing Threshold Amplitude: 0.75 V
Lead Channel Pacing Threshold Amplitude: 0.75 V
Lead Channel Pacing Threshold Pulse Width: 0.5 ms
Lead Channel Pacing Threshold Pulse Width: 0.5 ms
Lead Channel Sensing Intrinsic Amplitude: 1.8 mV
Lead Channel Sensing Intrinsic Amplitude: 12 mV
Lead Channel Setting Pacing Amplitude: 1 V
Lead Channel Setting Pacing Amplitude: 2 V
Lead Channel Setting Pacing Pulse Width: 0.5 ms
Lead Channel Setting Sensing Sensitivity: 4 mV
Pulse Gen Model: 2272
Pulse Gen Serial Number: 9064942

## 2022-06-09 NOTE — Progress Notes (Signed)
Remote pacemaker transmission.   

## 2022-06-11 ENCOUNTER — Ambulatory Visit: Payer: Medicare Other | Admitting: Cardiovascular Disease

## 2022-07-08 ENCOUNTER — Other Ambulatory Visit: Payer: Self-pay | Admitting: Nurse Practitioner

## 2022-07-08 DIAGNOSIS — I152 Hypertension secondary to endocrine disorders: Secondary | ICD-10-CM

## 2022-07-10 ENCOUNTER — Ambulatory Visit (INDEPENDENT_AMBULATORY_CARE_PROVIDER_SITE_OTHER): Payer: Medicare Other | Admitting: Physician Assistant

## 2022-07-10 ENCOUNTER — Encounter: Payer: Self-pay | Admitting: Physician Assistant

## 2022-07-10 VITALS — BP 132/60 | HR 84 | Resp 18 | Ht 65.0 in | Wt 167.4 lb

## 2022-07-10 DIAGNOSIS — J449 Chronic obstructive pulmonary disease, unspecified: Secondary | ICD-10-CM | POA: Diagnosis not present

## 2022-07-10 DIAGNOSIS — E1159 Type 2 diabetes mellitus with other circulatory complications: Secondary | ICD-10-CM | POA: Diagnosis not present

## 2022-07-10 DIAGNOSIS — E119 Type 2 diabetes mellitus without complications: Secondary | ICD-10-CM

## 2022-07-10 DIAGNOSIS — E785 Hyperlipidemia, unspecified: Secondary | ICD-10-CM

## 2022-07-10 DIAGNOSIS — I152 Hypertension secondary to endocrine disorders: Secondary | ICD-10-CM

## 2022-07-10 DIAGNOSIS — E1169 Type 2 diabetes mellitus with other specified complication: Secondary | ICD-10-CM | POA: Diagnosis not present

## 2022-07-10 NOTE — Assessment & Plan Note (Signed)
-  Last lipid panel LDL 109 (goal<70) and patient reports compliance with statin so will repeat lipid panel and hepatic function today. Recommend to follow a low fat diet.

## 2022-07-10 NOTE — Patient Instructions (Signed)

## 2022-07-10 NOTE — Telephone Encounter (Signed)
Medications were addressed at office visit.

## 2022-07-10 NOTE — Assessment & Plan Note (Addendum)
-  oxygen saturation 95% RA. Discussed with patient to ensure to follow-up with pulmonology. Continue to use flutter device if helpful.

## 2022-07-10 NOTE — Progress Notes (Signed)
Established patient visit   Patient: [name]   DOB: 1941-03-06   81 y.o. Female  MRN: [id number] Visit Date: 07/10/2022  Chief Complaint  Patient presents with   Follow-up   Diabetes   Hyperlipidemia   Hypertension   Subjective    HPI  Patient presents for chronic follow-up visit. Patient is accompanied by her daughter. Patient reports shortness of breath is about the same. Did not start Symbicort because it was too expensive and has not scheduled a follow-up visit with pulmonology. Not using albuterol inhaler. Uses flutter daily.  Diabetes mellitus: No increased urination or thirst. Pt reports medication compliance. No hypoglycemic events. Checking glucose at home. FBS range from 130s.  HTN: Pt denies chest pain, palpitations, dizziness or syncope. Reports edema has been good- taking Lasix 40 mg daily. Taking BP medication as directed without side effects.   HLD: Pt reports restarted Pravastatin 40 mg daily. States trying to limit eating out.   Medications: Outpatient Medications Prior to Visit  Medication Sig   albuterol (VENTOLIN HFA) 108 (90 Base) MCG/ACT inhaler Inhale 2 puffs into the lungs every 6 (six) hours as needed for wheezing or shortness of breath.   aspirin EC 81 MG tablet Take 81 mg by mouth daily.   budesonide-formoterol (SYMBICORT) 80-4.5 MCG/ACT inhaler Inhale 2 puffs into the lungs in the morning and at bedtime.   furosemide (LASIX) 40 MG tablet TAKE 1 TABLET DAILY. MAY TAKE AN EXTRA TABLET FOR WEIGHT GAIN OF 2 LB IN A DAY & 5 LBS IN A WEEK   losartan (COZAAR) 100 MG tablet TAKE 1 TABLET BY MOUTH EVERY DAY   Nutritional Supplements (JUICE PLUS FIBRE PO) Take 3 tablets by mouth at bedtime.   OXYGEN 2lpm with sleep and exertion [employer]   pantoprazole (PROTONIX) 40 MG tablet Take 1 tablet (40 mg total) by mouth daily.   pravastatin (PRAVACHOL) 40 MG tablet Take 1 tablet (40 mg total) by mouth daily.   Respiratory Therapy Supplies (FLUTTER) DEVI Use  flutter device three times a day as instructed   KLOR-CON M20 20 MEQ tablet Take 1 tablet (20 mEq total) by mouth once for 1 dose.   No facility-administered medications prior to visit.    Review of Systems Review of Systems:  A fourteen system review of systems was performed and found to be positive as per HPI.  Last CBC Lab Results  Component Value Date   WBC 7.1 04/09/2022   HGB 13.7 04/09/2022   HCT 40.8 04/09/2022   MCV 94 04/09/2022   MCH 31.6 04/09/2022   RDW 11.9 04/09/2022   PLT 164 85/88/5027   Last metabolic panel Lab Results  Component Value Date   GLUCOSE 120 (H) 04/09/2022   NA 136 04/09/2022   K 4.5 04/09/2022   CL 96 04/09/2022   CO2 28 04/09/2022   BUN 18 04/09/2022   CREATININE 0.61 04/09/2022   EGFR 89 04/09/2022   CALCIUM 9.4 04/09/2022   PROT 6.6 04/09/2022   ALBUMIN 4.5 04/09/2022   LABGLOB 2.1 04/09/2022   AGRATIO 2.1 04/09/2022   BILITOT 0.7 04/09/2022   ALKPHOS 53 04/09/2022   AST 15 04/09/2022   ALT 14 04/09/2022   ANIONGAP 9 08/01/2019   Last lipids Lab Results  Component Value Date   CHOL 199 04/09/2022   HDL 69 04/09/2022   LDLCALC 109 (H) 04/09/2022   LDLDIRECT 76 07/18/2019   TRIG 123 04/09/2022   CHOLHDL 2.9 04/09/2022   Last hemoglobin A1c Lab Results  Component Value Date   HGBA1C 5.9 (A) 04/07/2022   Last thyroid functions Lab Results  Component Value Date   TSH 1.680 04/09/2022   Last vitamin D No results found for: "25OHVITD2", "25OHVITD3", "VD25OH"     Objective    BP 132/60   Pulse 84   Resp 18   Ht _0  (1.651 m)   Wt 167 lb 6.4 oz (75.9 kg)   SpO2 95%   BMI 27.86 kg/m  BP Readings from Last 3 Encounters:  07/10/22 132/60  04/25/22 128/70  04/07/22 (!) 143/80   Wt Readings from Last 3 Encounters:  07/10/22 167 lb 6.4 oz (75.9 kg)  04/25/22 167 lb 9.6 oz (76 kg)  04/07/22 167 lb (75.8 kg)    Physical Exam  General:  Pleasant and cooperative, in no acute distress, appropriate for stated age.   Neuro:  Alert and oriented,  extra-ocular muscles intact  HEENT:  Normocephalic, atraumatic, neck supple  Skin:  no gross rash, warm, pink. Cardiac:  RRR, +systolic murmur Respiratory:+wheezing, no rhonchi, crackles or rales. Vascular:  Ext warm, no cyanosis apprec.; no edema Psych:  No HI/SI, judgement and insight good, Euthymic mood. Full Affect.   No results found for any visits on 07/10/22.  Assessment & Plan      Problem List Items Addressed This Visit       Cardiovascular and Mediastinum   Hypertension associated with diabetes ([place])    -BP elevated on intake, repeated and stable. Continue Losartan 100 mg daily. Will collect CMP to monitor renal function and electrolytes. Recommend to continue limiting sodium intake.      Relevant Orders   Comp Met (CMET)     Respiratory   COPD GOLD II    -oxygen saturation 95% RA. Discussed with patient to ensure to follow-up with pulmonology. Continue to use flutter device if helpful.        Endocrine   Diabetes mellitus type 2, controlled, without complications ([place]) - Primary    -Diet controlled, last A1c 5.9. Will repeat A1c today. Recommend to continue ambulatory glucose monitoring. Recommend to follow a low carbohydrate and glucose diet. Patient was unable to provide urine specimen for microalbumin, recommend obtaining at follow-up visit.      Relevant Orders   HgB A1c   Hyperlipidemia associated with type 2 diabetes mellitus ([facility])    -Last lipid panel LDL 109 (goal<70) and patient reports compliance with statin so will repeat lipid panel and hepatic function today. Recommend to follow a low fat diet.       Relevant Orders   Lipid Profile    Return in about 4 months (around 11/08/2022) for [place].        Lorrene Reid, PA-C  [facility] [phone] (phone) [phone] (fax)  [place]

## 2022-07-10 NOTE — Assessment & Plan Note (Addendum)
-  BP elevated on intake, repeated and stable. Continue Losartan 100 mg daily. Will collect CMP to monitor renal function and electrolytes. Recommend to continue limiting sodium intake.

## 2022-07-10 NOTE — Assessment & Plan Note (Signed)
-  Diet controlled, last A1c 5.9. Will repeat A1c today. Recommend to continue ambulatory glucose monitoring. Recommend to follow a low carbohydrate and glucose diet. Patient was unable to provide urine specimen for microalbumin, recommend obtaining at follow-up visit.

## 2022-07-11 LAB — COMPREHENSIVE METABOLIC PANEL
ALT: 11 IU/L (ref 0–32)
AST: 17 IU/L (ref 0–40)
Albumin/Globulin Ratio: 2.7 — ABNORMAL HIGH (ref 1.2–2.2)
Albumin: 5.1 g/dL — ABNORMAL HIGH (ref 3.7–4.7)
Alkaline Phosphatase: 56 IU/L (ref 44–121)
BUN/Creatinine Ratio: 29 — ABNORMAL HIGH (ref 12–28)
BUN: 18 mg/dL (ref 8–27)
Bilirubin Total: 0.7 mg/dL (ref 0.0–1.2)
CO2: 26 mmol/L (ref 20–29)
Calcium: 9.3 mg/dL (ref 8.7–10.3)
Chloride: 98 mmol/L (ref 96–106)
Creatinine, Ser: 0.63 mg/dL (ref 0.57–1.00)
Globulin, Total: 1.9 g/dL (ref 1.5–4.5)
Glucose: 121 mg/dL — ABNORMAL HIGH (ref 70–99)
Potassium: 4 mmol/L (ref 3.5–5.2)
Sodium: 142 mmol/L (ref 134–144)
Total Protein: 7 g/dL (ref 6.0–8.5)
eGFR: 89 mL/min/{1.73_m2} (ref >59)

## 2022-07-11 LAB — LIPID PANEL
Chol/HDL Ratio: 2.6 ratio (ref 0.0–4.4)
Cholesterol, Total: 168 mg/dL (ref 100–199)
HDL: 65 mg/dL (ref >39)
LDL Chol Calc (NIH): 78 mg/dL (ref 0–99)
Triglycerides: 149 mg/dL (ref 0–149)
VLDL Cholesterol Cal: 25 mg/dL (ref 5–40)

## 2022-07-11 LAB — HEMOGLOBIN A1C
Est. average glucose Bld gHb Est-mCnc: 126 mg/dL
Hgb A1c MFr Bld: 6 % — ABNORMAL HIGH (ref 4.8–5.6)

## 2022-07-16 ENCOUNTER — Telehealth: Payer: Self-pay

## 2022-07-16 NOTE — Telephone Encounter (Signed)
Form has been placed up front for pick up.

## 2022-07-16 NOTE — Telephone Encounter (Signed)
Pt daughter Otila Kluver came by and dropped off the application for renewal for the parking placard to be completed.   Application has been placed in providers box.  Please advise

## 2022-08-07 ENCOUNTER — Other Ambulatory Visit: Payer: Self-pay | Admitting: Nurse Practitioner

## 2022-08-07 DIAGNOSIS — M1712 Unilateral primary osteoarthritis, left knee: Secondary | ICD-10-CM | POA: Diagnosis not present

## 2022-08-07 DIAGNOSIS — I152 Hypertension secondary to endocrine disorders: Secondary | ICD-10-CM

## 2022-08-14 DIAGNOSIS — M1712 Unilateral primary osteoarthritis, left knee: Secondary | ICD-10-CM | POA: Diagnosis not present

## 2022-08-21 DIAGNOSIS — M1712 Unilateral primary osteoarthritis, left knee: Secondary | ICD-10-CM | POA: Diagnosis not present

## 2022-09-03 ENCOUNTER — Ambulatory Visit (INDEPENDENT_AMBULATORY_CARE_PROVIDER_SITE_OTHER): Payer: Medicare Other

## 2022-09-03 DIAGNOSIS — I442 Atrioventricular block, complete: Secondary | ICD-10-CM | POA: Diagnosis not present

## 2022-09-03 LAB — CUP PACEART REMOTE DEVICE CHECK
Battery Remaining Longevity: 69 mo
Battery Remaining Percentage: 59 %
Battery Voltage: 2.99 V
Brady Statistic AP VP Percent: 30 %
Brady Statistic AP VS Percent: 1 %
Brady Statistic AS VP Percent: 68 %
Brady Statistic AS VS Percent: 1 %
Brady Statistic RA Percent Paced: 28 %
Brady Statistic RV Percent Paced: 98 %
Date Time Interrogation Session: 20240103020015
Implantable Lead Connection Status: 753985
Implantable Lead Connection Status: 753985
Implantable Lead Implant Date: 20090401
Implantable Lead Implant Date: 20090401
Implantable Lead Location: 753859
Implantable Lead Location: 753860
Implantable Pulse Generator Implant Date: 20190925
Lead Channel Impedance Value: 410 Ohm
Lead Channel Impedance Value: 430 Ohm
Lead Channel Pacing Threshold Amplitude: 0.75 V
Lead Channel Pacing Threshold Amplitude: 0.75 V
Lead Channel Pacing Threshold Pulse Width: 0.5 ms
Lead Channel Pacing Threshold Pulse Width: 0.5 ms
Lead Channel Sensing Intrinsic Amplitude: 12 mV
Lead Channel Sensing Intrinsic Amplitude: 2.7 mV
Lead Channel Setting Pacing Amplitude: 1 V
Lead Channel Setting Pacing Amplitude: 2 V
Lead Channel Setting Pacing Pulse Width: 0.5 ms
Lead Channel Setting Sensing Sensitivity: 4 mV
Pulse Gen Model: 2272
Pulse Gen Serial Number: 9064942

## 2022-09-26 NOTE — Progress Notes (Signed)
Remote pacemaker transmission.   

## 2022-10-06 ENCOUNTER — Other Ambulatory Visit: Payer: Self-pay | Admitting: Cardiovascular Disease

## 2022-10-09 ENCOUNTER — Telehealth: Payer: Self-pay | Admitting: Cardiovascular Disease

## 2022-10-09 NOTE — Telephone Encounter (Signed)
Daughter calling to see if its okay for the patient to wear a life alert. Please advise

## 2022-10-09 NOTE — Telephone Encounter (Signed)
Daughter stated that since pt has pacer, if it is alright for pt to wear a life alert necklace. The manufacturer told her to ask cardiologist. Please advise.

## 2022-10-10 NOTE — Telephone Encounter (Signed)
Spoke with daughter Otila Kluver and gave her Dr. Victorino December reply: "The life alert device is unlikely to interfere with her pacemaker, but I would avoid putting it right on top of the device.  Try to keep it 6 inches away." She thanked me for the call.

## 2022-10-10 NOTE — Telephone Encounter (Signed)
The life alert device is unlikely to interfere with her pacemaker, but I would avoid putting it right on top of the device.  Try to keep it 6 inches away.

## 2022-11-10 ENCOUNTER — Encounter: Payer: Self-pay | Admitting: Family Medicine

## 2022-11-10 ENCOUNTER — Ambulatory Visit (INDEPENDENT_AMBULATORY_CARE_PROVIDER_SITE_OTHER): Payer: Medicare Other | Admitting: Family Medicine

## 2022-11-10 VITALS — BP 123/74 | HR 67 | Resp 18 | Ht 65.0 in | Wt 171.0 lb

## 2022-11-10 DIAGNOSIS — N3000 Acute cystitis without hematuria: Secondary | ICD-10-CM

## 2022-11-10 DIAGNOSIS — E1159 Type 2 diabetes mellitus with other circulatory complications: Secondary | ICD-10-CM | POA: Diagnosis not present

## 2022-11-10 DIAGNOSIS — Z0000 Encounter for general adult medical examination without abnormal findings: Secondary | ICD-10-CM

## 2022-11-10 DIAGNOSIS — R8290 Unspecified abnormal findings in urine: Secondary | ICD-10-CM

## 2022-11-10 DIAGNOSIS — E119 Type 2 diabetes mellitus without complications: Secondary | ICD-10-CM

## 2022-11-10 DIAGNOSIS — E785 Hyperlipidemia, unspecified: Secondary | ICD-10-CM | POA: Diagnosis not present

## 2022-11-10 DIAGNOSIS — I152 Hypertension secondary to endocrine disorders: Secondary | ICD-10-CM

## 2022-11-10 DIAGNOSIS — E1169 Type 2 diabetes mellitus with other specified complication: Secondary | ICD-10-CM | POA: Diagnosis not present

## 2022-11-10 LAB — POCT URINALYSIS DIPSTICK
Bilirubin, UA: NEGATIVE
Glucose, UA: NEGATIVE
Ketones, UA: NEGATIVE
Nitrite, UA: POSITIVE
Protein, UA: POSITIVE — AB
Spec Grav, UA: 1.02 (ref 1.010–1.025)
Urobilinogen, UA: 0.2 E.U./dL
pH, UA: 6.5 (ref 5.0–8.0)

## 2022-11-10 LAB — POCT UA - MICROALBUMIN
Creatinine, POC: 100 mg/dL
Microalbumin Ur, POC: 80 mg/L

## 2022-11-10 MED ORDER — NITROFURANTOIN MONOHYD MACRO 100 MG PO CAPS: 100.0000 mg | ORAL_CAPSULE | Freq: Two times a day (BID) | ORAL | 0 refills | Status: AC

## 2022-11-10 NOTE — Progress Notes (Signed)
Subjective:   [name] is a 82 y.o. female who presents for Medicare Annual (Subsequent) preventive examination.  She fell yesterday and hit her left knee.  There is now some bruising present, but no swelling and she is still able to bear weight.  Review of Systems    Review of Systems  HENT:  Positive for hearing loss.   Eyes:  Positive for blurred vision.  Musculoskeletal:  Positive for joint pain.  All other systems reviewed and are negative.       Objective:    Today's Vitals   11/10/22 0955 11/10/22 1001  BP: 123/74   Pulse: 67   Resp: 18   SpO2: 94%   Weight: 171 lb (77.6 kg)   Height: '5\' 5"'$  (1.651 m)   PainSc: 8  8   PainLoc: Knee    Body mass index is 28.46 kg/m.     05/30/2021   11:05 PM 05/27/2021   12:55 PM 07/29/2019    3:06 AM 07/28/2019    9:37 PM 09/21/2018    3:40 PM 07/01/2018    6:00 PM 05/26/2018   11:35 AM  Advanced Directives  Does Patient Have a Medical Advance Directive? Yes Yes No No No No No  Type of Paramedic of [place];Living will       Does patient want to make changes to medical advance directive? No - Patient declined        Copy of [place] in Chart? No - copy requested        Would patient like information on creating a medical advance directive?   No - Patient declined No - Patient declined No - Patient declined No - Patient declined     Current Medications (verified) Outpatient Encounter Medications as of 11/10/2022  Medication Sig   albuterol (VENTOLIN HFA) 108 (90 Base) MCG/ACT inhaler Inhale 2 puffs into the lungs every 6 (six) hours as needed for wheezing or shortness of breath.   aspirin EC 81 MG tablet Take 81 mg by mouth daily.   budesonide-formoterol (SYMBICORT) 80-4.5 MCG/ACT inhaler Inhale 2 puffs into the lungs in the morning and at bedtime.   furosemide (LASIX) 40 MG tablet TAKE 1 TABLET DAILY. MAY TAKE AN EXTRA TABLET FOR WEIGHT GAIN OF 2 LB  IN A DAY & 5 LBS IN A WEEK   KLOR-CON M20 20 MEQ tablet Take 1 tablet (20 mEq total) by mouth once for 1 dose.   losartan (COZAAR) 100 MG tablet TAKE 1 TABLET BY MOUTH EVERY DAY   Nutritional Supplements (JUICE PLUS FIBRE PO) Take 3 tablets by mouth at bedtime.   OXYGEN 2lpm with sleep and exertion [employer]   pantoprazole (PROTONIX) 40 MG tablet Take 1 tablet (40 mg total) by mouth daily.   pravastatin (PRAVACHOL) 40 MG tablet Take 1 tablet (40 mg total) by mouth daily.   Respiratory Therapy Supplies (FLUTTER) DEVI Use flutter device three times a day as instructed   No facility-administered encounter medications on file as of 11/10/2022.    Allergies (verified) Patient has no known allergies.   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   "after spinal cord injury/OR"   Shortness of breath    exertional   Type II diabetes mellitus ([place])    "had it years ago; lost weight after neck OR in 2007; glucose went way down; recently dx'd as returned recently" (12/07/2014)   Urinary incontinence    "since 2007's OR"   Venous insufficiency (chronic) (peripheral)    Past Surgical History:  Procedure Laterality Date   ANTERIOR CERVICAL DECOMP/DISCECTOMY FUSION  2007   BACK SURGERY     BIOPSY  07/29/2019   Procedure: BIOPSY;  Surgeon: Ladene Artist, MD;  Location: [place];  Service: Endoscopy;;   CARDIAC CATHETERIZATION  09/10/2000   normal Coronary arteries,  EF60%,    CARDIAC PACEMAKER PLACEMENT  12/01/2007   [employer] Elta [place] [id number], dual chamber   CARDIOVASCULAR STRESS TEST  08/28/2000   Cardiolite perfusion study, EF58%, low risk study,    DILATION AND CURETTAGE OF UTERUS  1960's   ESOPHAGOGASTRODUODENOSCOPY N/A 07/29/2019   Procedure: ESOPHAGOGASTRODUODENOSCOPY (EGD);  Surgeon: Ladene Artist, MD;  Location: [facility] ENDOSCOPY;  Service: Endoscopy;  Laterality: N/A;   HOT HEMOSTASIS N/A 07/29/2019   Procedure: HOT HEMOSTASIS (ARGON PLASMA COAGULATION/BICAP);  Surgeon: Ladene Artist, MD;  Location[facility] ENDOSCOPY;  Service: Endoscopy;  Laterality: N/A;   KNEE ARTHROSCOPY Right    LAPAROSCOPIC CHOLECYSTECTOMY  1995   LEFT AND RIGHT HEART CATHETERIZATION WITH CORONARY ANGIOGRAM N/A 12/14/2014   Procedure: LEFT AND RIGHT HEART CATHETERIZATION WITH CORONARY ANGIOGRAM;  Surgeon: Peter M Martinique, MD;  Location: [facility] CATH LAB;  Service: Cardiovascular;  Laterality: N/A;   Lower Ext. Dopplers  06/12/2011   no evidence of thrombus, all vessels normal in size, no evidence of insuffiency   LUMBAR LAMINECTOMY/DECOMPRESSION MICRODISCECTOMY  03/02/2012   Procedure: LUMBAR LAMINECTOMY/DECOMPRESSION MICRODISCECTOMY 2 LEVELS;  Surgeon: Floyce Stakes, MD;  Location: [place] NEURO ORS;  Service: Neurosurgery;  Laterality: Bilateral;  Lumbar three, lumbar four, lumbar five Laminectomy/Foraminotomy   MOLE REMOVAL     left side of face near her eye   NM MYOCAR PERF WALL MOTION  10/24/2008   protocol:Lexiscan, EF63%, normal study, scan negative for ischemia   PPM GENERATOR CHANGEOUT N/A 05/26/2018   Procedure: PPM GENERATOR CHANGEOUT;  Surgeon: Sanda Klein, MD;  Location: [place] CV LAB;  Service: Cardiovascular;  Laterality: N/A;   TRANSTHORACIC ECHOCARDIOGRAM  11/30/2007   normal echo, mean transaortic valve gradient 69mHg   Family History  Problem Relation Age of Onset   Diabetes Father    Heart disease Father        before age 84  Hyperlipidemia Father     Hypertension Father    Heart attack Father    Congestive Heart Failure Father    Diabetes Mother    Heart disease Mother    Hyperlipidemia Mother    Hypertension Mother    Congestive Heart Failure Mother    Diabetes Brother    Congestive Heart Failure Brother    Colon cancer Neg Hx    Esophageal cancer Neg Hx    Rectal cancer Neg Hx    Social History   Socioeconomic History   Marital status: Widowed    Spouse name: Not on file  Number of children: 2   Years of education: Not on file   Highest education level: Not on file  Occupational History   Occupation: Self employed- Academic librarian business -retired  Tobacco Use   Smoking status: Former    Packs/day: 0.50    Years: 32.00    Total pack years: 16.00    Types: Cigarettes    Start date: 1963    Quit date: 03/02/1994    Years since quitting: 28.7    Passive exposure: Never   Smokeless tobacco: Never  Vaping Use   Vaping Use: Never used  Substance and Sexual Activity   Alcohol use: No   Drug use: No   Sexual activity: Not Currently    Birth control/protection: None  Other Topics Concern   Not on file  Social History Narrative   Not on file   Social Determinants of Health   Financial Resource Strain: Low Risk  (11/10/2022)   Overall Financial Resource Strain (CARDIA)    Difficulty of Paying Living Expenses: Not hard at all  Food Insecurity: No Food Insecurity (11/10/2022)   Hunger Vital Sign    Worried About Running Out of Food in the Last Year: Never true    [place] in the Last Year: Never true  Transportation Needs: No Transportation Needs (11/10/2022)   PRAPARE - Hydrologist (Medical): No    Lack of Transportation (Non-Medical): No  Physical Activity: Insufficiently Active (11/10/2022)   Exercise Vital Sign    Days of Exercise per Week: 7 days    Minutes of Exercise per Session: 20 min  Stress: No Stress Concern Present (11/10/2022)   [place]    Feeling of Stress : Only a little  Social Connections: Moderately Isolated (11/10/2022)   Social Connection and Isolation Panel [NHANES]    Frequency of Communication with Friends and Family: More than three times a week    Frequency of Social Gatherings with Friends and Family: More than three times a week    Attends Religious Services: Never    Marine scientist or Organizations: Yes    Attends Archivist Meetings: Never    Marital Status: Widowed    Tobacco Counseling Counseling given: No   Clinical Intake:  Pre-visit preparation completed: No  Pain : 0-10 Pain Score: 8  Pain Type: Chronic pain Pain Location: Knee Pain Orientation: Left Pain Descriptors / Indicators: Aching, Discomfort Pain Onset: More than a month ago Pain Frequency: Constant Pain Relieving Factors: Steriod injections, topical gels Effect of Pain on Daily Activities: difficulty walking and constant falls  Pain Relieving Factors: Steriod injections, topical gels  BMI - recorded: 28.46 Nutritional Status: BMI 25 -29 Overweight Nutritional Risks: None Diabetes: Yes CBG done?: No Did pt. bring in CBG monitor from home?: No  How often do you need to have someone help you when you read instructions, pamphlets, or other written materials from your doctor or pharmacy?: 2 - Rarely  Diabetic? Yes   Interpreter Needed?: No  Information entered by :: Leanord Asal, RMA   Activities of Daily Living    11/10/2022    9:57 AM 04/07/2022    4:16 PM  In your present state of health, do you have any difficulty performing the following activities:  Hearing? 1 1  Vision? 1 0  Difficulty concentrating or making decisions? 1   Walking or climbing stairs? 1 1  Dressing or bathing? 0  0  Doing errands, shopping? 1 0  Comment  children runs errands for her    Patient Care Team: Lorrene Reid, PA-C (Inactive) as PCP - General (Physician Assistant) [name],  [name], MD as PCP - Cardiology (Cardiology)  Indicate any recent Medical Services you may have received from other than [facility] providers in the past year (date may be approximate).     Assessment:   This is a routine wellness examination for [name].  Hearing/Vision screen Hearing Screening - Comments:: Followed by ENT Vision Screening - Comments:: Followed by Ophthalmology    Dietary issues and exercise activities discussed:     Goals Addressed   None   Depression Screen    11/10/2022    9:56 AM 07/10/2022    9:15 AM 04/07/2022    4:16 PM 11/21/2021    4:08 PM 08/22/2021    3:23 PM 05/30/2021   11:03 PM 01/16/2021    8:20 AM  PHQ 2/9 Scores  PHQ - 2 Score 0 0 0 0 1 0   PHQ- 9 Score  0 1 0 3    Exception Documentation       Patient refusal    Fall Risk    11/10/2022    9:56 AM 07/10/2022    9:15 AM 04/07/2022    4:15 PM 11/21/2021    4:08 PM 08/22/2021    2:32 PM  Fall Risk   Falls in the past year? 1 1 0 0 0  Number falls in past yr: 1  0 0 0  Injury with Fall? 0  0 0 0  Risk for fall due to : History of fall(s);Impaired balance/gait;Orthopedic patient  No Fall Risks No Fall Risks No Fall Risks  Follow up   Falls evaluation completed Falls evaluation completed Falls evaluation completed    [place]:  Any stairs in or around the home? Yes  If so, are there any without handrails? No  Home free of loose throw rugs in walkways, pet beds, electrical cords, etc? Yes  Adequate lighting in your home to reduce risk of falls? Yes   ASSISTIVE DEVICES UTILIZED TO PREVENT FALLS:  Life alert? Yes  Use of a cane, walker or w/c? Yes  Grab bars in the bathroom? Yes  Shower chair or bench in shower? Yes  Elevated toilet seat or a handicapped toilet? Yes   TIMED UP AND GO:  Was the test performed? Yes .  Length of time to ambulate 10 feet: 10-15 sec.   Gait slow and steady with assistive device  Cognitive Function:        11/10/2022    9:57  AM 05/30/2021   11:09 PM  6CIT Screen  What Year? 0 points 0 points  What month? 0 points 0 points  What time? 0 points 0 points  Count back from 20 0 points 0 points  Months in reverse 0 points 0 points  Repeat phrase 0 points 0 points  Total Score 0 points 0 points    Immunizations Immunization History  Administered Date(s) Administered   Fluad Quad(high Dose 65+) 06/04/2022   Influenza Split 06/01/2014, 06/02/2015   Influenza, High Dose Seasonal PF 06/04/2018   Moderna Sars-Covid-2 Vaccination 04/03/2020, 04/30/2020   PFIZER Comirnaty(Gray Top)Covid-19 Tri-Sucrose Vaccine 12/13/2020   PFIZER(Purple Top)SARS-COV-2 Vaccination 06/25/2020   Pfizer Covid-19 Vaccine Bivalent Booster 74yr & up 12/23/2021   Pneumococcal Polysaccharide-23 06/01/2006   Unspecified SARS-COV-2 Vaccination 06/04/2022    TDAP status: Due, Education has been  provided regarding the importance of this vaccine. Advised may receive this vaccine at local pharmacy or Health Dept. Aware to provide a copy of the vaccination record if obtained from local pharmacy or Health Dept. Verbalized acceptance and understanding.  Flu Vaccine status: Up to date  Pneumococcal vaccine status: Due, Education has been provided regarding the importance of this vaccine. Advised may receive this vaccine at local pharmacy or Health Dept. Aware to provide a copy of the vaccination record if obtained from local pharmacy or Health Dept. Verbalized acceptance and understanding.  Covid-19 vaccine status: Information provided on how to obtain vaccines.   Qualifies for Shingles Vaccine? Yes   Zostavax completed No   Shingrix Completed?: No.    Education has been provided regarding the importance of this vaccine. Patient has been advised to call insurance company to determine out of pocket expense if they have not yet received this vaccine. Advised may also receive vaccine at local pharmacy or Health Dept. Verbalized acceptance and  understanding.  Screening Tests Health Maintenance  Topic Date Due   OPHTHALMOLOGY EXAM  Never done   DTaP/Tdap/Td (1 - Tdap) Never done   Zoster Vaccines- Shingrix (1 of 2) 02/10/2023 (Originally 08/10/1960)   COVID-19 Vaccine (7 - 2023-24 season) 05/03/2023 (Originally 07/30/2022)   Pneumonia Vaccine 72+ Years old (2 of 2 - PCV) 11/10/2023 (Originally 06/02/2007)   DEXA SCAN  11/10/2023 (Originally 08/10/2004)   FOOT EXAM  11/22/2022   HEMOGLOBIN A1C  01/08/2023   Diabetic kidney evaluation - eGFR measurement  07/11/2023   Diabetic kidney evaluation - Urine ACR  11/10/2023   Medicare Annual Wellness (AWV)  11/10/2023   INFLUENZA VACCINE  Completed   HPV VACCINES  Aged Out    Health Maintenance  Health Maintenance Due  Topic Date Due   OPHTHALMOLOGY EXAM  Never done   DTaP/Tdap/Td (1 - Tdap) Never done    Colorectal cancer screening: No longer required.   Mammogram status: No longer required due to aged out.  Bone Density status: Ordered 11/10/2022. Pt provided with contact info and advised to call to schedule appt.  Lung Cancer Screening: (Low Dose CT Chest recommended if Age 71-80 years, 30 pack-year currently smoking OR have quit w/in 15years.) does not qualify.    Additional Screening:  Hepatitis C Screening: does not qualify.  Vision Screening: Recommended annual ophthalmology exams for early detection of glaucoma and other disorders of the eye. Is the patient up to date with their annual eye exam?     Who is the provider or what is the name of the office in which the patient attends annual eye exams? Patient unable to provide at this time If pt is not established with a provider, would they like to be referred to a provider to establish care?  Patient is established .   Dental Screening: Recommended annual dental exams for proper oral hygiene  Community Resource Referral / Chronic Care Management: CRR required this visit?  No   CCM required this visit?  No       Plan:    We discussed breast cancer screening, colorectal cancer screening, and bone marrow density screening.  Patient declines all at this time.  She is due for her annual eye exam and will return to [facility].  We will request those results as soon as the appointment is complete.  Urinalysis was positive for nitrite and leukocytes.  Will treat with 5-day course of Macrobid.  I have personally reviewed and noted the following  in the patient's chart:   Medical and social history Use of alcohol, tobacco or illicit drugs  Current medications and supplements including opioid prescriptions. Patient is not currently taking opioid prescriptions. Functional ability and status Nutritional status Physical activity Advanced directives List of other physicians Hospitalizations, surgeries, and ER visits in previous 12 months Vitals Screenings to include cognitive, depression, and falls Referrals and appointments  In addition, I have reviewed and discussed with patient certain preventive protocols, quality metrics, and best practice recommendations. A written personalized care plan for preventive services as well as general preventive health recommendations were provided to patient.    Return in 3 months (on 02/10/2023) for follow-up for hypertension, diabetes, hyperlipidemia.   Velva Harman, PA   11/10/2022   Nurse Notes: Face to Face 20 min

## 2022-11-11 LAB — HEMOGLOBIN A1C
Est. average glucose Bld gHb Est-mCnc: 137 mg/dL
Hgb A1c MFr Bld: 6.4 % — ABNORMAL HIGH (ref 4.8–5.6)

## 2022-11-11 LAB — CBC WITH DIFFERENTIAL/PLATELET
Basophils Absolute: 0 10*3/uL (ref 0.0–0.2)
Basos: 0 %
EOS (ABSOLUTE): 0.1 10*3/uL (ref 0.0–0.4)
Eos: 1 %
Hematocrit: 39.5 % (ref 34.0–46.6)
Hemoglobin: 13.1 g/dL (ref 11.1–15.9)
Immature Grans (Abs): 0 10*3/uL (ref 0.0–0.1)
Immature Granulocytes: 0 %
Lymphocytes Absolute: 2.4 10*3/uL (ref 0.7–3.1)
Lymphs: 30 %
MCH: 31.1 pg (ref 26.6–33.0)
MCHC: 33.2 g/dL (ref 31.5–35.7)
MCV: 94 fL (ref 79–97)
Monocytes Absolute: 0.4 10*3/uL (ref 0.1–0.9)
Monocytes: 5 %
Neutrophils Absolute: 5.2 10*3/uL (ref 1.4–7.0)
Neutrophils: 64 %
Platelets: 169 10*3/uL (ref 150–450)
RBC: 4.21 x10E6/uL (ref 3.77–5.28)
RDW: 12.2 % (ref 11.7–15.4)
WBC: 8.1 10*3/uL (ref 3.4–10.8)

## 2022-11-11 LAB — COMPREHENSIVE METABOLIC PANEL
ALT: 10 IU/L (ref 0–32)
AST: 16 IU/L (ref 0–40)
Albumin/Globulin Ratio: 2.8 — ABNORMAL HIGH (ref 1.2–2.2)
Albumin: 4.8 g/dL — ABNORMAL HIGH (ref 3.7–4.7)
Alkaline Phosphatase: 65 IU/L (ref 44–121)
BUN/Creatinine Ratio: 24 (ref 12–28)
BUN: 13 mg/dL (ref 8–27)
Bilirubin Total: 0.7 mg/dL (ref 0.0–1.2)
CO2: 28 mmol/L (ref 20–29)
Calcium: 9 mg/dL (ref 8.7–10.3)
Chloride: 98 mmol/L (ref 96–106)
Creatinine, Ser: 0.54 mg/dL — ABNORMAL LOW (ref 0.57–1.00)
Globulin, Total: 1.7 g/dL (ref 1.5–4.5)
Glucose: 115 mg/dL — ABNORMAL HIGH (ref 70–99)
Potassium: 4.3 mmol/L (ref 3.5–5.2)
Sodium: 140 mmol/L (ref 134–144)
Total Protein: 6.5 g/dL (ref 6.0–8.5)
eGFR: 91 mL/min/{1.73_m2} (ref >59)

## 2022-11-11 LAB — LIPID PANEL
Chol/HDL Ratio: 3.3 ratio (ref 0.0–4.4)
Cholesterol, Total: 208 mg/dL — ABNORMAL HIGH (ref 100–199)
HDL: 64 mg/dL (ref >39)
LDL Chol Calc (NIH): 119 mg/dL — ABNORMAL HIGH (ref 0–99)
Triglycerides: 143 mg/dL (ref 0–149)
VLDL Cholesterol Cal: 25 mg/dL (ref 5–40)

## 2022-11-13 LAB — URINE CULTURE

## 2022-11-17 ENCOUNTER — Telehealth: Payer: Self-pay

## 2022-11-17 NOTE — Telephone Encounter (Signed)
Patients daughter called office to see if she could come in and get another test for urine infection to make sure its gone, patient has been on antibiotics but daughter would like to make sure it's gone completely gone, please advise, thanks.

## 2022-11-18 NOTE — Telephone Encounter (Addendum)
Sure, that is not a problem.  We can do a point-of-care urinalysis, you can schedule that as a nurse visit.  Thank you!

## 2022-11-19 ENCOUNTER — Other Ambulatory Visit (INDEPENDENT_AMBULATORY_CARE_PROVIDER_SITE_OTHER): Payer: Medicare Other

## 2022-11-19 DIAGNOSIS — R8290 Unspecified abnormal findings in urine: Secondary | ICD-10-CM

## 2022-11-19 NOTE — Progress Notes (Signed)
Patient's daughter brought her in to submit a urine sample but was unable to collect specimen. Patient's daughter will try to bring her back another time to submit.    Patient's daughter was able to bring patient back into submit urine specimen 11/20/22 at 8am. U/A was ran and documented. Provider and patient's daughter has been notified of results

## 2022-11-20 ENCOUNTER — Encounter: Payer: Self-pay | Admitting: Family Medicine

## 2022-11-20 DIAGNOSIS — R319 Hematuria, unspecified: Secondary | ICD-10-CM

## 2022-11-20 LAB — POCT URINALYSIS DIPSTICK
Bilirubin, UA: NEGATIVE
Glucose, UA: NEGATIVE
Ketones, UA: NEGATIVE
Leukocytes, UA: NEGATIVE
Nitrite, UA: NEGATIVE
Protein, UA: NEGATIVE
Spec Grav, UA: 1.015 (ref 1.010–1.025)
Urobilinogen, UA: 0.2 E.U./dL
pH, UA: 5.5 (ref 5.0–8.0)

## 2022-11-20 NOTE — Addendum Note (Signed)
Addended by: Gemma Payor on: 11/20/2022 08:24 AM   Modules accepted: Orders

## 2022-12-03 ENCOUNTER — Ambulatory Visit (INDEPENDENT_AMBULATORY_CARE_PROVIDER_SITE_OTHER): Payer: Medicare Other

## 2022-12-03 ENCOUNTER — Other Ambulatory Visit: Payer: Self-pay | Admitting: Nurse Practitioner

## 2022-12-03 DIAGNOSIS — I152 Hypertension secondary to endocrine disorders: Secondary | ICD-10-CM

## 2022-12-03 DIAGNOSIS — I442 Atrioventricular block, complete: Secondary | ICD-10-CM | POA: Diagnosis not present

## 2022-12-03 LAB — CUP PACEART REMOTE DEVICE CHECK
Battery Remaining Longevity: 65 mo
Battery Remaining Percentage: 56 %
Battery Voltage: 2.99 V
Brady Statistic AP VP Percent: 30 %
Brady Statistic AP VS Percent: 1 %
Brady Statistic AS VP Percent: 68 %
Brady Statistic AS VS Percent: 1 %
Brady Statistic RA Percent Paced: 27 %
Brady Statistic RV Percent Paced: 98 %
Date Time Interrogation Session: 20240403020014
Implantable Lead Connection Status: 753985
Implantable Lead Connection Status: 753985
Implantable Lead Implant Date: 20090401
Implantable Lead Implant Date: 20090401
Implantable Lead Location: 753859
Implantable Lead Location: 753860
Implantable Pulse Generator Implant Date: 20190925
Lead Channel Impedance Value: 400 Ohm
Lead Channel Impedance Value: 400 Ohm
Lead Channel Pacing Threshold Amplitude: 0.75 V
Lead Channel Pacing Threshold Amplitude: 0.75 V
Lead Channel Pacing Threshold Pulse Width: 0.5 ms
Lead Channel Pacing Threshold Pulse Width: 0.5 ms
Lead Channel Sensing Intrinsic Amplitude: 1.8 mV
Lead Channel Sensing Intrinsic Amplitude: 12 mV
Lead Channel Setting Pacing Amplitude: 1 V
Lead Channel Setting Pacing Amplitude: 2 V
Lead Channel Setting Pacing Pulse Width: 0.5 ms
Lead Channel Setting Sensing Sensitivity: 4 mV
Pulse Gen Model: 2272
Pulse Gen Serial Number: 9064942

## 2022-12-16 DIAGNOSIS — Z23 Encounter for immunization: Secondary | ICD-10-CM | POA: Diagnosis not present

## 2022-12-17 ENCOUNTER — Telehealth: Payer: Self-pay

## 2022-12-17 DIAGNOSIS — E1169 Type 2 diabetes mellitus with other specified complication: Secondary | ICD-10-CM

## 2022-12-17 MED ORDER — PRAVASTATIN SODIUM 40 MG PO TABS: 40.0000 mg | ORAL_TABLET | Freq: Every day | ORAL | 0 refills | Status: DC

## 2022-12-17 NOTE — Telephone Encounter (Signed)
Refill sent.

## 2022-12-17 NOTE — Telephone Encounter (Signed)
Pt is requesting a refill on:  pravastatin (PRAVACHOL) 40 MG tablet    Pharmacy: [id number] - , [place] - [address]   LOV 11/10/22 ROV 02/10/23

## 2022-12-18 DIAGNOSIS — N3941 Urge incontinence: Secondary | ICD-10-CM | POA: Diagnosis not present

## 2023-01-08 NOTE — Progress Notes (Signed)
Remote pacemaker transmission.   

## 2023-02-10 ENCOUNTER — Ambulatory Visit: Payer: Medicare Other | Admitting: Family Medicine

## 2023-02-12 ENCOUNTER — Ambulatory Visit: Payer: Medicare Other | Admitting: Family Medicine

## 2023-02-23 ENCOUNTER — Ambulatory Visit: Payer: Medicare Other | Admitting: Family Medicine

## 2023-02-24 ENCOUNTER — Ambulatory Visit: Payer: Medicare Other | Admitting: Adult Health

## 2023-02-25 ENCOUNTER — Ambulatory Visit (INDEPENDENT_AMBULATORY_CARE_PROVIDER_SITE_OTHER): Payer: Medicare Other | Admitting: Family Medicine

## 2023-02-25 ENCOUNTER — Encounter: Payer: Self-pay | Admitting: Family Medicine

## 2023-02-25 VITALS — BP 135/81 | HR 87 | Resp 18 | Ht 65.0 in | Wt 170.0 lb

## 2023-02-25 DIAGNOSIS — I152 Hypertension secondary to endocrine disorders: Secondary | ICD-10-CM | POA: Diagnosis not present

## 2023-02-25 DIAGNOSIS — E1169 Type 2 diabetes mellitus with other specified complication: Secondary | ICD-10-CM | POA: Diagnosis not present

## 2023-02-25 DIAGNOSIS — J449 Chronic obstructive pulmonary disease, unspecified: Secondary | ICD-10-CM

## 2023-02-25 DIAGNOSIS — E119 Type 2 diabetes mellitus without complications: Secondary | ICD-10-CM | POA: Diagnosis not present

## 2023-02-25 DIAGNOSIS — I5042 Chronic combined systolic (congestive) and diastolic (congestive) heart failure: Secondary | ICD-10-CM

## 2023-02-25 DIAGNOSIS — E1159 Type 2 diabetes mellitus with other circulatory complications: Secondary | ICD-10-CM | POA: Diagnosis not present

## 2023-02-25 DIAGNOSIS — E785 Hyperlipidemia, unspecified: Secondary | ICD-10-CM

## 2023-02-25 MED ORDER — LOSARTAN POTASSIUM 100 MG PO TABS: 100.0000 mg | ORAL_TABLET | Freq: Every day | ORAL | 1 refills | Status: DC

## 2023-02-25 MED ORDER — PRAVASTATIN SODIUM 40 MG PO TABS: 40.0000 mg | ORAL_TABLET | Freq: Every day | ORAL | 1 refills | Status: DC

## 2023-02-25 NOTE — Assessment & Plan Note (Signed)
Stable, at goal.  Continue losartan 100 mg daily.  Will continue to monitor.

## 2023-02-25 NOTE — Assessment & Plan Note (Signed)
Last lipid panel: LDL 119, HDL 64, triglycerides 143.  Not fasting today, will come back for fasting labs and repeat lipid panel.  Continue pravastatin 40 mg daily unless lab results warrant change.

## 2023-02-25 NOTE — Progress Notes (Signed)
Established Patient Office Visit  Subjective   [name], female    DOB: 05/23/1941  Age: 82 y.o. MRN: [id number]  Chief Complaint  Patient presents with   Diabetes    Non Fasting   Hyperlipidemia   Hypertension    HPI [name] is a 82 y.o. female presenting today with her daughter for follow up of hypertension, hyperlipidemia, diabetes. Hypertension: Patient here for follow-up of elevated blood pressure. She is not exercising and is adherent to low salt diet.  She does try to get up about once an hour to walk around the house.  Pt denies chest pain, SOB, dizziness, edema, syncope, fatigue or heart palpitations. Taking losartan, reports excellent compliance with treatment. Denies side effects. Hyperlipidemia: tolerating pravastatin well with no myalgias or significant side effects. The ASCVD Risk score (Arnett DK, et al., 2019) failed to calculate for the following reasons:   The 2019 ASCVD risk score is only valid for ages 20 to 19 Diabetes: Patient surprised that she has a diagnosis of diabetes, which her daughter states is not uncommon for her.  Denies hypoglycemic events, wounds or sores that are not healing well, increased thirst or urination. Denies vision problems, eye exam overdue. Checking glucose at home, fasting ranges have been in the 130s.  Her daughter also expressed some concern over her diet.  [name]'s brother is morbidly obese and takes her out to eat at fast food restaurants or [employer] 2-3 times each week.  Her weight has been steadily increasing over the past year, from 151 pounds on 11/21/2021 to now 170 pounds.  ROS Negative unless otherwise noted in HPI   Objective:     BP 135/81 (BP Location: Left Arm, Patient Position: Sitting, Cuff Size: Large)   Pulse 87   Resp 18   Ht 5\' 5"  (1.651 m)   Wt 170 lb (77.1 kg)   SpO2 (!) 88%   BMI 28.29 kg/m   Physical Exam Constitutional:      General: She is not in acute distress.     Appearance: Normal appearance.  HENT:     Head: Normocephalic and atraumatic.  Cardiovascular:     Rate and Rhythm: Normal rate and regular rhythm.     Heart sounds: No murmur heard.    No friction rub. No gallop.  Pulmonary:     Effort: Pulmonary effort is normal. No respiratory distress.     Breath sounds: No wheezing, rhonchi or rales.  Skin:    General: Skin is warm and dry.  Neurological:     Mental Status: She is alert and oriented to person, place, and time.      Assessment & Plan:  Hyperlipidemia associated with type 2 diabetes mellitus (HCC) Assessment & Plan: Last lipid panel: LDL 119, HDL 64, triglycerides 143.  Not fasting today, will come back for fasting labs and repeat lipid panel.  Continue pravastatin 40 mg daily unless lab results warrant change.  Orders: -     Pravastatin Sodium; Take 1 tablet (40 mg total) by mouth daily.  Dispense: 90 tablet; Refill: 1 -     Lipid panel; Future -     CBC with Differential/Platelet; Future  Hypertension associated with diabetes (HCC) Assessment & Plan: Stable, at goal.  Continue losartan 100 mg daily.  Will continue to monitor.  Orders: -     Losartan Potassium; Take 1 tablet (100 mg total) by mouth daily.  Dispense: 90 tablet; Refill: 1 -  Comprehensive metabolic panel; Future  Controlled type 2 diabetes mellitus without complication, without long-term current use of insulin (HCC) Assessment & Plan: Most recent A1c 6.4, repeating A1c with fasting lab work.  Diet controlled.  Recommend to continue ambulatory glucose monitoring.  Extensively discussed low carbohydrate/glucose diet including what types of food is a carbohydrate.  Recommended/handed out patient educational materials on diabetic plate method.  We discussed the need for an eye exam.  Will perform foot exam at follow-up appointment.  Orders: -     Hemoglobin A1c; Future    Return in about 3 months (around 05/28/2023) for follow-up, fasting blood work 1 week  before.    Tammy Quitter, PA

## 2023-02-25 NOTE — Assessment & Plan Note (Signed)
Most recent A1c 6.4, repeating A1c with fasting lab work.  Diet controlled.  Recommend to continue ambulatory glucose monitoring.  Extensively discussed low carbohydrate/glucose diet including what types of food is a carbohydrate.  Recommended/handed out patient educational materials on diabetic plate method.  We discussed the need for an eye exam.  Will perform foot exam at follow-up appointment.

## 2023-03-04 ENCOUNTER — Ambulatory Visit (INDEPENDENT_AMBULATORY_CARE_PROVIDER_SITE_OTHER): Payer: Medicare Other

## 2023-03-04 ENCOUNTER — Other Ambulatory Visit: Payer: Medicare Other

## 2023-03-04 DIAGNOSIS — E785 Hyperlipidemia, unspecified: Secondary | ICD-10-CM | POA: Diagnosis not present

## 2023-03-04 DIAGNOSIS — E1159 Type 2 diabetes mellitus with other circulatory complications: Secondary | ICD-10-CM

## 2023-03-04 DIAGNOSIS — I442 Atrioventricular block, complete: Secondary | ICD-10-CM

## 2023-03-04 DIAGNOSIS — E1169 Type 2 diabetes mellitus with other specified complication: Secondary | ICD-10-CM | POA: Diagnosis not present

## 2023-03-04 DIAGNOSIS — I152 Hypertension secondary to endocrine disorders: Secondary | ICD-10-CM | POA: Diagnosis not present

## 2023-03-04 DIAGNOSIS — E119 Type 2 diabetes mellitus without complications: Secondary | ICD-10-CM

## 2023-03-04 LAB — CUP PACEART REMOTE DEVICE CHECK
Battery Remaining Longevity: 64 mo
Battery Remaining Percentage: 54 %
Battery Voltage: 2.98 V
Brady Statistic AP VP Percent: 28 %
Brady Statistic AP VS Percent: 1 %
Brady Statistic AS VP Percent: 69 %
Brady Statistic AS VS Percent: 1 %
Brady Statistic RA Percent Paced: 26 %
Brady Statistic RV Percent Paced: 98 %
Date Time Interrogation Session: 20240703031023
Implantable Lead Connection Status: 753985
Implantable Lead Connection Status: 753985
Implantable Lead Implant Date: 20090401
Implantable Lead Implant Date: 20090401
Implantable Lead Location: 753859
Implantable Lead Location: 753860
Implantable Pulse Generator Implant Date: 20190925
Lead Channel Impedance Value: 430 Ohm
Lead Channel Impedance Value: 460 Ohm
Lead Channel Pacing Threshold Amplitude: 0.625 V
Lead Channel Pacing Threshold Amplitude: 0.75 V
Lead Channel Pacing Threshold Pulse Width: 0.5 ms
Lead Channel Pacing Threshold Pulse Width: 0.5 ms
Lead Channel Sensing Intrinsic Amplitude: 12 mV
Lead Channel Sensing Intrinsic Amplitude: 2 mV
Lead Channel Setting Pacing Amplitude: 0.875
Lead Channel Setting Pacing Amplitude: 2 V
Lead Channel Setting Pacing Pulse Width: 0.5 ms
Lead Channel Setting Sensing Sensitivity: 4 mV
Pulse Gen Model: 2272
Pulse Gen Serial Number: 9064942

## 2023-03-05 LAB — HEMOGLOBIN A1C
Est. average glucose Bld gHb Est-mCnc: 134 mg/dL
Hgb A1c MFr Bld: 6.3 % — ABNORMAL HIGH (ref 4.8–5.6)

## 2023-03-05 LAB — COMPREHENSIVE METABOLIC PANEL
ALT: 9 IU/L (ref 0–32)
AST: 17 IU/L (ref 0–40)
Albumin: 4.9 g/dL — ABNORMAL HIGH (ref 3.7–4.7)
Alkaline Phosphatase: 58 IU/L (ref 44–121)
BUN/Creatinine Ratio: 23 (ref 12–28)
BUN: 15 mg/dL (ref 8–27)
Bilirubin Total: 0.7 mg/dL (ref 0.0–1.2)
CO2: 29 mmol/L (ref 20–29)
Calcium: 9.5 mg/dL (ref 8.7–10.3)
Chloride: 96 mmol/L (ref 96–106)
Creatinine, Ser: 0.65 mg/dL (ref 0.57–1.00)
Globulin, Total: 2.2 g/dL (ref 1.5–4.5)
Glucose: 113 mg/dL — ABNORMAL HIGH (ref 70–99)
Potassium: 4.2 mmol/L (ref 3.5–5.2)
Sodium: 140 mmol/L (ref 134–144)
Total Protein: 7.1 g/dL (ref 6.0–8.5)
eGFR: 87 mL/min/{1.73_m2} (ref >59)

## 2023-03-05 LAB — CBC WITH DIFFERENTIAL/PLATELET
Basophils Absolute: 0 10*3/uL (ref 0.0–0.2)
Basos: 0 %
EOS (ABSOLUTE): 0.1 10*3/uL (ref 0.0–0.4)
Eos: 1 %
Hematocrit: 44.1 % (ref 34.0–46.6)
Hemoglobin: 14.6 g/dL (ref 11.1–15.9)
Immature Grans (Abs): 0 10*3/uL (ref 0.0–0.1)
Immature Granulocytes: 0 %
Lymphocytes Absolute: 2.3 10*3/uL (ref 0.7–3.1)
Lymphs: 32 %
MCH: 31.7 pg (ref 26.6–33.0)
MCHC: 33.1 g/dL (ref 31.5–35.7)
MCV: 96 fL (ref 79–97)
Monocytes Absolute: 0.6 10*3/uL (ref 0.1–0.9)
Monocytes: 8 %
Neutrophils Absolute: 4.4 10*3/uL (ref 1.4–7.0)
Neutrophils: 59 %
Platelets: 162 10*3/uL (ref 150–450)
RBC: 4.6 x10E6/uL (ref 3.77–5.28)
RDW: 12 % (ref 11.7–15.4)
WBC: 7.4 10*3/uL (ref 3.4–10.8)

## 2023-03-05 LAB — LIPID PANEL
Chol/HDL Ratio: 2.4 ratio (ref 0.0–4.4)
Cholesterol, Total: 157 mg/dL (ref 100–199)
HDL: 66 mg/dL (ref >39)
LDL Chol Calc (NIH): 64 mg/dL (ref 0–99)
Triglycerides: 163 mg/dL — ABNORMAL HIGH (ref 0–149)
VLDL Cholesterol Cal: 27 mg/dL (ref 5–40)

## 2023-03-12 ENCOUNTER — Encounter: Payer: Self-pay | Admitting: Cardiovascular Disease

## 2023-03-12 ENCOUNTER — Ambulatory Visit: Payer: Medicare Other | Admitting: Cardiovascular Disease

## 2023-03-12 VITALS — BP 140/64 | HR 73 | Ht 65.0 in | Wt 168.0 lb

## 2023-03-12 DIAGNOSIS — J449 Chronic obstructive pulmonary disease, unspecified: Secondary | ICD-10-CM

## 2023-03-12 DIAGNOSIS — I5032 Chronic diastolic (congestive) heart failure: Secondary | ICD-10-CM | POA: Diagnosis not present

## 2023-03-12 DIAGNOSIS — I2721 Secondary pulmonary arterial hypertension: Secondary | ICD-10-CM | POA: Diagnosis not present

## 2023-03-12 DIAGNOSIS — I442 Atrioventricular block, complete: Secondary | ICD-10-CM | POA: Diagnosis not present

## 2023-03-12 DIAGNOSIS — I2510 Atherosclerotic heart disease of native coronary artery without angina pectoris: Secondary | ICD-10-CM

## 2023-03-12 DIAGNOSIS — R7303 Prediabetes: Secondary | ICD-10-CM

## 2023-03-12 DIAGNOSIS — I110 Hypertensive heart disease with heart failure: Secondary | ICD-10-CM

## 2023-03-12 DIAGNOSIS — I4719 Other supraventricular tachycardia: Secondary | ICD-10-CM

## 2023-03-12 DIAGNOSIS — Z950 Presence of cardiac pacemaker: Secondary | ICD-10-CM | POA: Diagnosis not present

## 2023-03-12 DIAGNOSIS — E7800 Pure hypercholesterolemia, unspecified: Secondary | ICD-10-CM

## 2023-03-12 NOTE — Patient Instructions (Signed)
Medication Instructions:  No changes *If you need a refill on your cardiac medications before your next appointment, please call your pharmacy*  Follow-Up: At [facility], you and your health needs are our priority.  As part of our continuing mission to provide you with exceptional heart care, we have created designated Provider Care Teams.  These Care Teams include your primary Cardiologist (physician) and Advanced Practice Providers (APPs -  Physician Assistants and Nurse Practitioners) who all work together to provide you with the care you need, when you need it.  We recommend signing up for the patient portal called "MyChart".  Sign up information is provided on this After Visit Summary.  MyChart is used to connect with patients for Virtual Visits (Telemedicine).  Patients are able to view lab/test results, encounter notes, upcoming appointments, etc.  Non-urgent messages can be sent to your provider as well.   To learn more about what you can do with MyChart, go to [email or web address].    Your next appointment:   1 year(s)  Provider:   Thurmon Fair, MD     Other Instructions Please let us know who your Oxygen/Medical Supply company is.

## 2023-03-12 NOTE — Progress Notes (Signed)
[name], female   DOB: March 26, 1941, 82 y.o.   MRN: [id number]    Cardiology Office Note    Date:  03/13/2023   ID:  [name], DOB 1941-01-05, MRN [id number]  PCP:  [name], PA  Cardiologist:   Thurmon Fair, MD   Chief Complaint  Patient presents with   Shortness of Breath   Pacemaker Check    History of Present Illness:  [name] is a 82 y.o. female with complete heart block/pacemaker dependent, St. Jude pacemaker (implanted 05-Apr-2008, generator change April 05, 2018 St. Jude Assurity), chronic diastolic heart failure, hypertension, COPD with chronic respiratory insufficiency on home oxygen (quit smoking >25 years ago).   She is doing quite well since her previous appointment.  She has not required any adjustments in her diuretic dose and has not had admissions for heart failure.  She has at most mild leg swelling.  She is quite sedentary.  She quickly drops her oxygen saturation with minimal activity and is reluctant to wear her O2, even when her family insists.  She does not have orthopnea or PND but sounds like she has NYHA functional class III dyspnea.  She has not had any chest pain.  As before, activity level is primarily limited by low back pain and leg weakness due to degenerative spine disease.  Family is carefully monitoring her sodium intake, but she enjoys having a gravy biscuit twice a week.  Echo September 2023 showed stable EF at 50-55% and grade 1 diastolic dysfunction, normal left atrial filling pressures, pacing related dyssynchrony as expected.  There were no serious valvular abnormalities.  She did have mild aortic stenosis and mild-moderate aortic insufficiency.  Coronary CTA 06/04/2019 showed nonobstructive CAD.   She has a history of bleeding, likely from H. pylori related duodenal ulcers in the past.  No recent issues.  Most recent hemoglobin was 13.1.  She is back on aspirin.  Has decent metabolic control with a hemoglobin A1c of 6.3% and an  LDL cholesterol of 64.  She has normal renal function, creatinine 0.65.  She has normal pacemaker function with an estimated generator longevity of 5 years.  She has 26% atrial pacing and 98% ventricular pacing.  She has frequent episodes of brief nonsustained ectopic atrial tachycardia, maximum 14 seconds in duration but she has never had atrial fibrillation.   She had minor coronary artery disease by previous angiography and has never had angina pectoris. She had acute HF exacerbation due to severe hypertension in the weeks after her husband passed away in 04/06/2015. At that time she underwent coronary angiography that showed very minor disease (30% stenosis at the takeoff of the first diagonal artery), normal pulmonary wedge pressure and mild pulmonary hypertension (38/16).  Coronary CT angiography in October 2020 showed moderately elevated calcium score at the 62nd percentile for age/gender, and moderate (50-69%) lesions in the LAD artery and first diagonal artery, with no evidence of significant obstruction by FFR.  Past Medical History:  Diagnosis Date   Arthritis    "hands, spine" (12/07/2014)   CHB (complete heart block) (HCC) 09/19/2013   CHF (congestive heart failure) (HCC) dx'd 12/06/2014   Chronic pain    "since neck OR in Apr 05, 2006"   Coronary artery disease    normal coronaries by 09/10/00 cath   Diabetes mellitus    hga1c 7.1 no meds   Dysrhythmia    HTN (hypertension) 09/19/2013   Hypercholesterolemia    Hyperlipidemia 09/19/2013   Hypertension  Myocardial infarction [facility]) dx'd 2009   Neuropathy of both feet    Pacemaker    Paralysis of right hand (HCC) 2007   "after spinal cord injury/OR"   Shortness of breath    exertional   Type II diabetes mellitus (HCC)    "had it years ago; lost weight after neck OR in 2007; glucose went way down; recently dx'd as returned recently" (12/07/2014)   Urinary incontinence    "since 2007's OR"   Venous insufficiency (chronic) (peripheral)      Past Surgical History:  Procedure Laterality Date   ANTERIOR CERVICAL DECOMP/DISCECTOMY FUSION  2007   BACK SURGERY     BIOPSY  07/29/2019   Procedure: BIOPSY;  Surgeon: Meryl Dare, MD;  Location: [facility] ENDOSCOPY;  Service: Endoscopy;;   CARDIAC CATHETERIZATION  09/10/2000   normal Coronary arteries, EF60%,    CARDIAC PACEMAKER PLACEMENT  12/01/2007   St.Jude Zephyr Carlynn Purl #[id number], dual chamber   CARDIOVASCULAR STRESS TEST  08/28/2000   Cardiolite perfusion study, EF58%, low risk study,    DILATION AND CURETTAGE OF UTERUS  1960's   ESOPHAGOGASTRODUODENOSCOPY N/A 07/29/2019   Procedure: ESOPHAGOGASTRODUODENOSCOPY (EGD);  Surgeon: Meryl Dare, MD;  Location: [facility] ENDOSCOPY;  Service: Endoscopy;  Laterality: N/A;   HOT HEMOSTASIS N/A 07/29/2019   Procedure: HOT HEMOSTASIS (ARGON PLASMA COAGULATION/BICAP);  Surgeon: Meryl Dare, MD;  Location: [facility] ENDOSCOPY;  Service: Endoscopy;  Laterality: N/A;   KNEE ARTHROSCOPY Right    LAPAROSCOPIC CHOLECYSTECTOMY  1995   LEFT AND RIGHT HEART CATHETERIZATION WITH CORONARY ANGIOGRAM N/A 12/14/2014   Procedure: LEFT AND RIGHT HEART CATHETERIZATION WITH CORONARY ANGIOGRAM;  Surgeon: Peter M Swaziland, MD;  Location: [facility] CATH LAB;  Service: Cardiovascular;  Laterality: N/A;   Lower Ext. Dopplers  06/12/2011   no evidence of thrombus, all vessels normal in size, no evidence of insuffiency   LUMBAR LAMINECTOMY/DECOMPRESSION MICRODISCECTOMY  03/02/2012   Procedure: LUMBAR LAMINECTOMY/DECOMPRESSION MICRODISCECTOMY 2 LEVELS;  Surgeon: Karn Cassis, MD;  Location: [facility] NEURO ORS;  Service: Neurosurgery;  Laterality: Bilateral;  Lumbar three, lumbar four, lumbar five Laminectomy/Foraminotomy   MOLE REMOVAL     left side of face near her eye   NM MYOCAR PERF WALL MOTION  10/24/2008   protocol:Lexiscan, EF63%, normal study, scan negative for ischemia   PPM GENERATOR CHANGEOUT N/A 05/26/2018   Procedure: PPM GENERATOR CHANGEOUT;  Surgeon: Thurmon Fair,  MD;  Location: [facility] INVASIVE CV LAB;  Service: Cardiovascular;  Laterality: N/A;   TRANSTHORACIC ECHOCARDIOGRAM  11/30/2007   normal echo, mean transaortic valve gradient    Current Medications: Outpatient Medications Prior to Visit  Medication Sig Dispense Refill   aspirin EC 81 MG tablet Take 81 mg by mouth daily.     furosemide (LASIX) 40 MG tablet TAKE 1 TABLET DAILY. MAY TAKE AN EXTRA TABLET FOR WEIGHT GAIN OF 2 LB IN A DAY & 5 LBS IN A WEEK 180 tablet 1   KLOR-CON M20 20 MEQ tablet Take 1 tablet (20 mEq total) by mouth once for 1 dose. 90 tablet 3   losartan (COZAAR) 100 MG tablet Take 1 tablet (100 mg total) by mouth daily. 90 tablet 1   Multiple Vitamins-Minerals (CENTRUM SILVER 50+WOMEN PO) Take 1 tablet by mouth daily at 6 (six) AM.     OXYGEN 2lpm with sleep and exertion [employer]     pravastatin (PRAVACHOL) 40 MG tablet Take 1 tablet (40 mg total) by mouth daily. 90 tablet 1   Respiratory Therapy Supplies (FLUTTER) DEVI  Use flutter device three times a day as instructed 1 each 0   albuterol (VENTOLIN HFA) 108 (90 Base) MCG/ACT inhaler Inhale 2 puffs into the lungs every 6 (six) hours as needed for wheezing or shortness of breath. (Patient not taking: Reported on 03/12/2023) 8 g 2   budesonide-formoterol (SYMBICORT) 80-4.5 MCG/ACT inhaler Inhale 2 puffs into the lungs in the morning and at bedtime. (Patient not taking: Reported on 03/12/2023) 1 each 12   Nutritional Supplements (JUICE PLUS FIBRE PO) Take 3 tablets by mouth at bedtime.     pantoprazole (PROTONIX) 40 MG tablet Take 1 tablet (40 mg total) by mouth daily. (Patient not taking: Reported on 03/12/2023) 90 tablet 3   No facility-administered medications prior to visit.     Allergies:   Patient has no known allergies.   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  Tobacco Use   Smoking status: Former    Current packs/day: 0.00    Average packs/day: 0.5 packs/day for 32.5 years (16.2 ttl pk-yrs)    Types: Cigarettes    Start date: 1963    Quit date: 03/02/1994    Years since quitting: 29.0    Passive exposure: Never   Smokeless tobacco: Never  Vaping Use   Vaping status: Never Used  Substance and Sexual Activity   Alcohol use: No   Drug use: No   Sexual activity: Not Currently    Birth control/protection: None  Other Topics Concern   Not on file  Social History Narrative   Not on file   Social Determinants of Health   Financial Resource Strain: Low Risk  (11/10/2022)   Overall Financial Resource Strain (CARDIA)    Difficulty of Paying Living Expenses: Not hard at all  Food Insecurity: No Food Insecurity (11/10/2022)   Hunger Vital Sign    Worried About Running Out of Food in the Last Year: Never true    Ran Out of Food in the Last Year: Never true  Transportation Needs: No Transportation Needs (11/10/2022)   PRAPARE - Administrator, Civil Service (Medical): No    Lack of Transportation (Non-Medical): No  Physical Activity: Insufficiently Active (11/10/2022)   Exercise Vital Sign    Days of Exercise per Week: 7 days    Minutes of Exercise per Session: 20 min  Stress: No Stress Concern Present (11/10/2022)   [employer] of Occupational Health - Occupational Stress Questionnaire    Feeling of Stress : Only a little  Social Connections: Moderately Isolated (11/10/2022)   Social Connection and Isolation Panel [NHANES]    Frequency of Communication with Friends and Family: More than three times a week    Frequency of Social Gatherings with Friends and Family: More than three times a week    Attends Religious Services: Never    Database administrator or Organizations: Yes    Attends Banker Meetings: Never    Marital Status: Widowed      Family History:  The patient's \  family history includes Congestive Heart Failure in her brother, father, and mother; Diabetes in her brother, father, and mother; Heart attack in her father; Heart disease in her father and mother; Hyperlipidemia in her father and mother; Hypertension  in her father and mother.   ROS:   Please see the history of present illness.    ROS All other systems are reviewed and are negative.  PHYSICAL EXAM:   VS:  BP (!) 140/64 (BP Location: Left Arm, Patient Position: Sitting, Cuff Size: Normal)   Pulse 73   Ht 5\' 5"  (1.651 m)   Wt 168 lb (76.2 kg)   SpO2 92%   BMI 27.96 kg/m       General: Alert, oriented x3, no distress, elderly and frail.  Healthy left subclavian pacemaker site. Head: no evidence of trauma, PERRL, EOMI, no exophtalmos or lid lag, no myxedema, no xanthelasma; normal ears, nose and oropharynx Neck: normal jugular venous pulsations and no hepatojugular reflux; brisk carotid pulses without delay and no carotid bruits Chest: clear to auscultation, no signs of consolidation by percussion or palpation, normal fremitus, symmetrical and full respiratory excursions Cardiovascular: normal position and quality of the apical impulse, regular rhythm, normal first and second heart sounds, no murmurs, rubs or gallops Abdomen: no tenderness or distention, no masses by palpation, no abnormal pulsatility or arterial bruits, normal bowel sounds, no hepatosplenomegaly Extremities: no clubbing, cyanosis or edema; 2+ radial, ulnar and brachial pulses bilaterally; 2+ right femoral, posterior tibial and dorsalis pedis pulses; 2+ left femoral, posterior tibial and dorsalis pedis pulses; no subclavian or femoral bruits Neurological: grossly nonfocal Psych: Normal mood and affect   Wt Readings from Last 3 Encounters:  03/12/23 168 lb (76.2 kg)  02/25/23 170 lb (77.1 kg)  11/10/22 171 lb (77.6 kg)    Studies/Labs Reviewed:   Coronary CT Angio  06/04/2019: Dilated right pulmonary artery (35mm) and left pulmonary artery (33mm)   IMPRESSION: 1. Coronary calcium score of 179. This was 57 percentile for age and sex matched control.   2. Normal coronary origin with right dominance.   3. Nonobstructive CAD. There is calcified plaque in the mid LAD causing moderate (50-69%) stenosis. CT-FFR across this lesion is 0.85, suggesting lesion is not functionally significant. The plaque extends into the ostium of a diagonal branch, causing moderate (50-69%) stenosis. CT-FFR across the diagonal branch is 0.84, suggesting no functional significance   4. There is step artifact in the mid RCA. While no plaque is seen in this segment, definitive interpretation is precluded by artifact   CAD-RADS 3. Moderate stenosis. Consider symptom-guided anti-ischemic pharmacotherapy as well as risk factor modification per guideline directed care. Additional analysis with CT FFR will be submitted.  ECHO 05/12/2022:     1. Left ventricular ejection fraction, by estimation, is 50 to 55%. The  left ventricle has low normal function. The left ventricle has no regional  wall motion abnormalities. Left ventricular diastolic parameters are  consistent with Grade I diastolic  dysfunction (impaired relaxation). There is septal-lateral dyssynchrony in  the setting of RV pacing.   2. Right ventricular systolic function is normal. The right ventricular  size is normal. There is normal pulmonary artery systolic pressure. The  estimated right ventricular systolic pressure is 29.8 mmHg.   3. Left atrial size was moderately dilated.   4. Right atrial size was mildly dilated.   5. The mitral valve is degenerative. Mild mitral valve regurgitation.  Mild-to-moderate MAC.   6. The aortic valve is tricuspid. There is moderate calcification of the  aortic valve. There is moderate thickening of the aortic valve. Aortic  valve regurgitation is mild to moderate. Mild aortic  valve stenosis.  Aortic valve area, by VTI measures 1.37  cm.  Aortic valve mean gradient measures 14.0 mmHg. Aortic valve Vmax  measures 2.52 m/s.   7. The inferior vena cava is normal in size with greater than 50%  respiratory variability, suggesting right atrial pressure of 3 mmHg.   Comparison(s): Compared to prior TTE in 05/2019, the EF has improved  slightly from 45-50% to 50%. Inferior wall motion appears low normal on  current study. MR appears improved to mild. There continues to be mild AS  and mild-to-moderate AR.   EKG:  EKG is ordered today.  It shows atrial sensed, ventricular paced rhythm.  Recent Labs: 04/09/2022: BNP 75.4; TSH 1.680 03/04/2023: ALT 9; BUN 15; Creatinine, Ser 0.65; Hemoglobin 14.6; Platelets 162; Potassium 4.2; Sodium 140   Lipid Panel    Component Value Date/Time   CHOL 157 03/04/2023 0852   TRIG 163 (H) 03/04/2023 0852   HDL 66 03/04/2023 0852   CHOLHDL 2.4 03/04/2023 0852   CHOLHDL 3.7 11/30/2007 0510   VLDL 35 11/30/2007 0510   LDLCALC 64 03/04/2023 0852   LDLDIRECT 76 07/18/2019 1028    ASSESSMENT:    1. Chronic diastolic heart failure (HCC)   2. PAT (paroxysmal atrial tachycardia)   3. Coronary artery disease involving native coronary artery of native heart without angina pectoris   4. Hypercholesterolemia   5. Prediabetes   6. CHB (complete heart block) (HCC)   7. Pacemaker   8. COPD GOLD II   9. PAH (pulmonary artery hypertension) (HCC)    PLAN:  In order of problems listed above:  CHF: Preserved LVEF.  Mild reduction in LV systolic function primarily due to RV pacing related dyssynchrony.  Euvolemic clinically.  Asymptomatic but very sedentary.  Primarily limited by scoliosis and COPD rather than heart failure.  On ARB.   PAT: Frequent but asymptomatic and nonsustained. CAD: Asymptomatic.  She does not have angina pectoris.  She has had some progression in coronary disease compared to remote coronary angiography, based on recent  coronary CT angiogram, but no revascularization is necessary.  Focus on treatment of risk factors.   HLP: Continue her statin.  Most recent LDL cholesterol 64, at target.  Pre DM: Diet controlled most recent hemoglobin A1c 6.3%. CHB: pacemaker dependent.  PM: Normal device function.  Continue device checks remotely every 3 months. COPD: She has NYHA functional class III exertional dyspnea which seems to be largely due to lung disease.  It is present both at rest and with activity.  She has substantial desaturation with activity.  Encouraged her to wear her oxygen all the time.  She needs a portable oxygen concentrator.  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 Anemia: Resolved Peptic ulcer disease: No recurrence after treatment for H. pylori    Medication Adjustments/Labs and Tests Ordered: Current medicines are reviewed at length with the patient today.  Concerns regarding medicines are outlined above.  Medication changes, Labs and Tests ordered today are listed in the Patient Instructions below. Patient Instructions  Medication Instructions:  No changes *If you need a refill on your cardiac medications before your next appointment, please call your pharmacy*  Follow-Up: At [facility],  you and your health needs are our priority.  As part of our continuing mission to provide you with exceptional heart care, we have created designated Provider Care Teams.  These Care Teams include your primary Cardiologist (physician) and Advanced Practice Providers (APPs -  Physician Assistants and Nurse Practitioners) who all  work together to provide you with the care you need, when you need it.  We recommend signing up for the patient portal called "MyChart".  Sign up information is provided on this After Visit Summary.  MyChart is used to connect with patients for Virtual Visits (Telemedicine).  Patients are able to view lab/test results, encounter notes, upcoming appointments, etc.  Non-urgent messages can be sent to your provider as well.   To learn more about what you can do with MyChart, go to [email or web address].    Your next appointment:   1 year(s)  Provider:   Thurmon Fair, MD     Other Instructions Please let us know who your Oxygen/Medical Supply company is.    Signed, Thurmon Fair, MD  03/13/2023 11:14 AM    [facility] [address] [place], [place], [place]  [address] Phone: [phone]; Fax: [phone]

## 2023-03-13 ENCOUNTER — Encounter: Payer: Self-pay | Admitting: Cardiovascular Disease

## 2023-03-13 NOTE — Progress Notes (Signed)
Placed order for portable oxygen. Sent Community Message to Santina Evans with [facility] to make aware of order placed.

## 2023-03-13 NOTE — Addendum Note (Signed)
Addended by: Scheryl Marten on: 03/13/2023 05:05 PM   Modules accepted: Orders

## 2023-03-13 NOTE — Telephone Encounter (Signed)
Thanks, I will take care of it today

## 2023-03-13 NOTE — Telephone Encounter (Signed)
[facility] for portable O2 concentrator

## 2023-03-16 ENCOUNTER — Other Ambulatory Visit: Payer: Self-pay | Admitting: Emergency Medicine

## 2023-03-16 DIAGNOSIS — R7981 Abnormal blood-gas level: Secondary | ICD-10-CM

## 2023-03-16 DIAGNOSIS — J449 Chronic obstructive pulmonary disease, unspecified: Secondary | ICD-10-CM

## 2023-03-16 NOTE — Progress Notes (Signed)
Hebert Soho; Scheryl Marten, RN For POC eval/Best fit please have nurse/Provider update order the verbiage below.   "Please evaluate and titrate for best fit POC or portable O2 system. RT to evaluate and titrate patient for POC or Homefill with OCD maintain sats >/=90%. if patient qualifies, dispense POC or homefill with OCD 1-5 pulse dose.  Place new order to reflect the verbiage above

## 2023-03-19 ENCOUNTER — Encounter (INDEPENDENT_AMBULATORY_CARE_PROVIDER_SITE_OTHER): Payer: Medicare Other | Admitting: Family Medicine

## 2023-03-19 DIAGNOSIS — M1712 Unilateral primary osteoarthritis, left knee: Secondary | ICD-10-CM | POA: Diagnosis not present

## 2023-03-19 MED ORDER — DICLOFENAC SODIUM 1 % EX GEL
4.0000 g | Freq: Four times a day (QID) | CUTANEOUS | 2 refills | Status: AC
Start: 2023-03-19 — End: ?

## 2023-03-19 NOTE — Telephone Encounter (Signed)
Please see the MyChart message reply(ies) for my assessment and plan.    This patient gave consent for this Medical Advice Message and is aware that it may result in a bill to their insurance company, as well as the possibility of receiving a bill for a co-payment or deductible. They are an established patient, but are not seeking medical advice exclusively about a problem treated during an in person or video visit in the last seven days. I did not recommend an in person or video visit within seven days of my reply.    I spent a total of 5 minutes cumulative time within 7 days through MyChart messaging.  Morgan A Edstrom, PA   

## 2023-03-23 ENCOUNTER — Encounter: Payer: Self-pay | Admitting: Family Medicine

## 2023-03-26 NOTE — Progress Notes (Signed)
Remote pacemaker transmission.   

## 2023-04-08 DIAGNOSIS — M25562 Pain in left knee: Secondary | ICD-10-CM | POA: Diagnosis not present

## 2023-04-15 DIAGNOSIS — M25562 Pain in left knee: Secondary | ICD-10-CM | POA: Diagnosis not present

## 2023-04-30 ENCOUNTER — Encounter: Payer: Self-pay | Admitting: Cardiovascular Disease

## 2023-04-30 DIAGNOSIS — J449 Chronic obstructive pulmonary disease, unspecified: Secondary | ICD-10-CM

## 2023-04-30 DIAGNOSIS — R7981 Abnormal blood-gas level: Secondary | ICD-10-CM

## 2023-04-30 DIAGNOSIS — M1712 Unilateral primary osteoarthritis, left knee: Secondary | ICD-10-CM | POA: Diagnosis not present

## 2023-05-01 DIAGNOSIS — M792 Neuralgia and neuritis, unspecified: Secondary | ICD-10-CM | POA: Diagnosis not present

## 2023-05-01 DIAGNOSIS — M25562 Pain in left knee: Secondary | ICD-10-CM | POA: Diagnosis not present

## 2023-05-02 ENCOUNTER — Other Ambulatory Visit: Payer: Self-pay | Admitting: Cardiovascular Disease

## 2023-05-05 NOTE — Telephone Encounter (Signed)
I think she will probably do just as well with the pulse portable O2 concentrator. We can change the order, please.

## 2023-05-07 DIAGNOSIS — M1712 Unilateral primary osteoarthritis, left knee: Secondary | ICD-10-CM | POA: Diagnosis not present

## 2023-05-14 DIAGNOSIS — M1712 Unilateral primary osteoarthritis, left knee: Secondary | ICD-10-CM | POA: Diagnosis not present

## 2023-05-25 ENCOUNTER — Other Ambulatory Visit: Payer: Self-pay | Admitting: Family Medicine

## 2023-05-25 DIAGNOSIS — E785 Hyperlipidemia, unspecified: Secondary | ICD-10-CM

## 2023-05-25 DIAGNOSIS — I152 Hypertension secondary to endocrine disorders: Secondary | ICD-10-CM

## 2023-05-25 DIAGNOSIS — M25562 Pain in left knee: Secondary | ICD-10-CM | POA: Diagnosis not present

## 2023-05-25 DIAGNOSIS — E119 Type 2 diabetes mellitus without complications: Secondary | ICD-10-CM

## 2023-05-28 DIAGNOSIS — Z23 Encounter for immunization: Secondary | ICD-10-CM | POA: Diagnosis not present

## 2023-05-29 ENCOUNTER — Other Ambulatory Visit: Payer: Medicare Other

## 2023-05-29 DIAGNOSIS — E785 Hyperlipidemia, unspecified: Secondary | ICD-10-CM | POA: Diagnosis not present

## 2023-05-29 DIAGNOSIS — E1159 Type 2 diabetes mellitus with other circulatory complications: Secondary | ICD-10-CM

## 2023-05-29 DIAGNOSIS — I152 Hypertension secondary to endocrine disorders: Secondary | ICD-10-CM | POA: Diagnosis not present

## 2023-05-29 DIAGNOSIS — E1169 Type 2 diabetes mellitus with other specified complication: Secondary | ICD-10-CM | POA: Diagnosis not present

## 2023-05-29 DIAGNOSIS — E119 Type 2 diabetes mellitus without complications: Secondary | ICD-10-CM

## 2023-05-30 LAB — COMPREHENSIVE METABOLIC PANEL
ALT: 7 [IU]/L (ref 0–32)
AST: 17 [IU]/L (ref 0–40)
Albumin: 4.5 g/dL (ref 3.7–4.7)
Alkaline Phosphatase: 56 [IU]/L (ref 44–121)
BUN/Creatinine Ratio: 27 (ref 12–28)
BUN: 16 mg/dL (ref 8–27)
Bilirubin Total: 0.7 mg/dL (ref 0.0–1.2)
CO2: 27 mmol/L (ref 20–29)
Calcium: 9.3 mg/dL (ref 8.7–10.3)
Chloride: 96 mmol/L (ref 96–106)
Creatinine, Ser: 0.59 mg/dL (ref 0.57–1.00)
Globulin, Total: 2 g/dL (ref 1.5–4.5)
Glucose: 101 mg/dL — ABNORMAL HIGH (ref 70–99)
Potassium: 4.1 mmol/L (ref 3.5–5.2)
Sodium: 142 mmol/L (ref 134–144)
Total Protein: 6.5 g/dL (ref 6.0–8.5)
eGFR: 89 mL/min/{1.73_m2} (ref >59)

## 2023-05-30 LAB — HEMOGLOBIN A1C
Est. average glucose Bld gHb Est-mCnc: 131 mg/dL
Hgb A1c MFr Bld: 6.2 % — ABNORMAL HIGH (ref 4.8–5.6)

## 2023-05-30 LAB — LIPID PANEL
Chol/HDL Ratio: 2.6 {ratio} (ref 0.0–4.4)
Cholesterol, Total: 154 mg/dL (ref 100–199)
HDL: 60 mg/dL (ref >39)
LDL Chol Calc (NIH): 64 mg/dL (ref 0–99)
Triglycerides: 185 mg/dL — ABNORMAL HIGH (ref 0–149)
VLDL Cholesterol Cal: 30 mg/dL (ref 5–40)

## 2023-06-01 ENCOUNTER — Institutional Professional Consult (permissible substitution): Payer: Medicare Other | Admitting: Internal Medicine

## 2023-06-01 DIAGNOSIS — M25562 Pain in left knee: Secondary | ICD-10-CM | POA: Diagnosis not present

## 2023-06-03 LAB — CUP PACEART REMOTE DEVICE CHECK
Battery Remaining Longevity: 59 mo
Battery Remaining Percentage: 51 %
Battery Voltage: 2.98 V
Brady Statistic AP VP Percent: 27 %
Brady Statistic AP VS Percent: 1 %
Brady Statistic AS VP Percent: 71 %
Brady Statistic AS VS Percent: 1 %
Brady Statistic RA Percent Paced: 25 %
Brady Statistic RV Percent Paced: 98 %
Date Time Interrogation Session: 20241002020013
Implantable Lead Connection Status: 753985
Implantable Lead Connection Status: 753985
Implantable Lead Implant Date: 20090401
Implantable Lead Implant Date: 20090401
Implantable Lead Location: 753859
Implantable Lead Location: 753860
Implantable Pulse Generator Implant Date: 20190925
Lead Channel Impedance Value: 390 Ohm
Lead Channel Impedance Value: 400 Ohm
Lead Channel Pacing Threshold Amplitude: 0.75 V
Lead Channel Pacing Threshold Amplitude: 1 V
Lead Channel Pacing Threshold Pulse Width: 0.5 ms
Lead Channel Pacing Threshold Pulse Width: 0.5 ms
Lead Channel Sensing Intrinsic Amplitude: 12 mV
Lead Channel Sensing Intrinsic Amplitude: 2 mV
Lead Channel Setting Pacing Amplitude: 1 V
Lead Channel Setting Pacing Amplitude: 2 V
Lead Channel Setting Pacing Pulse Width: 0.5 ms
Lead Channel Setting Sensing Sensitivity: 4 mV
Pulse Gen Model: 2272
Pulse Gen Serial Number: 9064942

## 2023-06-04 ENCOUNTER — Other Ambulatory Visit: Payer: Self-pay | Admitting: Family Medicine

## 2023-06-04 ENCOUNTER — Encounter: Payer: Self-pay | Admitting: Family Medicine

## 2023-06-04 ENCOUNTER — Ambulatory Visit: Payer: Medicare Other | Admitting: Family Medicine

## 2023-06-04 ENCOUNTER — Ambulatory Visit (INDEPENDENT_AMBULATORY_CARE_PROVIDER_SITE_OTHER): Payer: Medicare Other

## 2023-06-04 VITALS — BP 144/79 | HR 82 | Resp 18 | Ht 65.0 in | Wt 171.0 lb

## 2023-06-04 DIAGNOSIS — E785 Hyperlipidemia, unspecified: Secondary | ICD-10-CM

## 2023-06-04 DIAGNOSIS — M25562 Pain in left knee: Secondary | ICD-10-CM | POA: Diagnosis not present

## 2023-06-04 DIAGNOSIS — E1159 Type 2 diabetes mellitus with other circulatory complications: Secondary | ICD-10-CM

## 2023-06-04 DIAGNOSIS — I152 Hypertension secondary to endocrine disorders: Secondary | ICD-10-CM | POA: Diagnosis not present

## 2023-06-04 DIAGNOSIS — I2721 Secondary pulmonary arterial hypertension: Secondary | ICD-10-CM | POA: Diagnosis not present

## 2023-06-04 DIAGNOSIS — I442 Atrioventricular block, complete: Secondary | ICD-10-CM | POA: Diagnosis not present

## 2023-06-04 DIAGNOSIS — M1712 Unilateral primary osteoarthritis, left knee: Secondary | ICD-10-CM | POA: Diagnosis not present

## 2023-06-04 DIAGNOSIS — E119 Type 2 diabetes mellitus without complications: Secondary | ICD-10-CM | POA: Diagnosis not present

## 2023-06-04 DIAGNOSIS — J9611 Chronic respiratory failure with hypoxia: Secondary | ICD-10-CM | POA: Diagnosis not present

## 2023-06-04 DIAGNOSIS — E1169 Type 2 diabetes mellitus with other specified complication: Secondary | ICD-10-CM | POA: Diagnosis not present

## 2023-06-04 MED ORDER — DICLOFENAC SODIUM 1 % EX GEL: 4.0000 g | Freq: Four times a day (QID) | CUTANEOUS | 2 refills | Status: DC

## 2023-06-04 NOTE — Assessment & Plan Note (Addendum)
BP goal <140/90.  Above goal in office, has not taken losartan today.  Return in 2 weeks for blood pressure check after taking medicine.  Continue losartan 100 mg daily.  Last CMP within normal limits.  Will continue to monitor.

## 2023-06-04 NOTE — Assessment & Plan Note (Signed)
Continue on oxygen.  O2 saturation goal > 88-90%.

## 2023-06-04 NOTE — Assessment & Plan Note (Signed)
A1c 6.3.  Diet controlled.  Recommend to continue ambulatory glucose monitoring.  Continue low carbohydrate/glucose diet.

## 2023-06-04 NOTE — Assessment & Plan Note (Signed)
Last lipid panel: LDL 64, HDL 60, triglycerides 185.  CMP within normal limits.  Continue pravastatin 40 mg daily and following heart healthy diet.  Will continue to monitor.

## 2023-06-04 NOTE — Patient Instructions (Signed)
AT THE PHARMACY: -Update your tetanus booster -Make sure that you have had your shingles shots -Make sure that you have had the PCV 20 pneumonia shot

## 2023-06-04 NOTE — Assessment & Plan Note (Signed)
Followed by cardiology.  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  Continue oxygen therapy.

## 2023-06-04 NOTE — Progress Notes (Signed)
Established Patient Office Visit  Subjective   [name], female    DOB: 1941-01-27  Age: 82 y.o. MRN: [id number]  Chief Complaint  Patient presents with   Diabetes   Hyperlipidemia   Hypertension    HPI [name] is a 82 y.o. female presenting today for follow up of hypertension, hyperlipidemia, diabetes. Hypertension: Patient here for follow-up of elevated blood pressure. She  is adherent to low salt diet.   Pt denies chest pain, SOB, dizziness, edema, syncope, fatigue or heart palpitations. Taking losartan, reports excellent compliance with treatment. Denies side effects.  She checks her blood pressure at home every morning prior to taking her blood pressure medicine.  She has not taken her blood pressure medicine today. Hyperlipidemia: tolerating pravastatin well with no myalgias or significant side effects.  The ASCVD Risk score (Arnett DK, et al., 2019) failed to calculate for the following reasons:   The 2019 ASCVD risk score is only valid for ages 58 to 21 Diabetes: denies hypoglycemic events, wounds or sores that are not healing well, increased thirst or urination. Denies vision problems, eye exam scheduled for December. Currently managing with diet alone.   Outpatient Medications Prior to Visit  Medication Sig   budesonide-formoterol (SYMBICORT) 80-4.5 MCG/ACT inhaler Inhale 2 puffs into the lungs in the morning and at bedtime.   furosemide (LASIX) 40 MG tablet TAKE 1 TABLET DAILY. MAY TAKE AN EXTRA TABLET FOR WEIGHT GAIN OF 2 LB IN A DAY & 5 LBS IN A WEEK   losartan (COZAAR) 100 MG tablet Take 1 tablet (100 mg total) by mouth daily.   Multiple Vitamins-Minerals (CENTRUM SILVER 50+WOMEN PO) Take 1 tablet by mouth daily at 6 (six) AM.   OXYGEN 2lpm with sleep and exertion [employer]   pravastatin (PRAVACHOL) 40 MG tablet Take 1 tablet (40 mg total) by mouth daily.   Respiratory Therapy Supplies (FLUTTER) DEVI Use flutter device three times a day as  instructed   [DISCONTINUED] diclofenac Sodium (VOLTAREN) 1 % GEL Apply 4 g topically 4 (four) times daily.   KLOR-CON M20 20 MEQ tablet TAKE 1 TABLET (20 MEQ TOTAL) BY MOUTH ONCE FOR 1 DOSE.   [DISCONTINUED] albuterol (VENTOLIN HFA) 108 (90 Base) MCG/ACT inhaler Inhale 2 puffs into the lungs every 6 (six) hours as needed for wheezing or shortness of breath. (Patient not taking: Reported on 06/04/2023)   [DISCONTINUED] aspirin EC 81 MG tablet Take 81 mg by mouth daily. (Patient not taking: Reported on 06/04/2023)   No facility-administered medications prior to visit.    ROS Negative unless otherwise noted in HPI   Objective:     BP (!) 144/79 (BP Location: Left Arm, Patient Position: Sitting, Cuff Size: Large)   Pulse 82   Resp 18   Ht 5\' 5"  (1.651 m)   Wt 171 lb (77.6 kg)   SpO2 98%   BMI 28.46 kg/m   Physical Exam Constitutional:      General: She is not in acute distress.    Appearance: Normal appearance.  HENT:     Head: Normocephalic and atraumatic.  Cardiovascular:     Rate and Rhythm: Normal rate and regular rhythm.     Heart sounds: No murmur heard.    No friction rub. No gallop.  Pulmonary:     Effort: Pulmonary effort is normal. No respiratory distress.     Breath sounds: No wheezing, rhonchi or rales.  Skin:    General: Skin is warm and dry.  Neurological:     Mental Status: She is alert and oriented to person, place, and time.    Diabetic Foot Exam - Simple   Simple Foot Form Diabetic Foot exam was performed with the following findings: Yes 06/04/2023  9:13 AM  Visual Inspection No deformities, no ulcerations, no other skin breakdown bilaterally: Yes Sensation Testing See comments: Yes Pulse Check Posterior Tibialis and Dorsalis pulse intact bilaterally: Yes Comments Left foot: -Sensation intact on lateral foot.  No sensation of toes or dorsal foot.  Right foot: -Sensation intact other than toes.     Assessment & Plan:  Hypertension associated with  diabetes (HCC) Assessment & Plan: BP goal <140/90.  Above goal in office, has not taken losartan today.  Return in 2 weeks for blood pressure check after taking medicine.  Continue losartan 100 mg daily.  Last CMP within normal limits.  Will continue to monitor.   Hyperlipidemia associated with type 2 diabetes mellitus (HCC) Assessment & Plan: Last lipid panel: LDL 64, HDL 60, triglycerides 185.  CMP within normal limits.  Continue pravastatin 40 mg daily and following heart healthy diet.  Will continue to monitor.   Controlled type 2 diabetes mellitus without complication, without long-term current use of insulin (HCC) Assessment & Plan: A1c 6.3.  Diet controlled.  Recommend to continue ambulatory glucose monitoring.  Continue low carbohydrate/glucose diet.   Chronic respiratory failure with hypoxia (HCC) Assessment & Plan: Continue on oxygen.  O2 saturation goal > 88-90%.   PAH (pulmonary artery hypertension) (HCC) Assessment & Plan: Followed by cardiology.  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  Continue oxygen therapy.   Primary osteoarthritis of left knee -     Diclofenac Sodium; Apply 4 g topically 4 (four) times daily.  Dispense: 100 g; Refill: 2   Return in 2 weeks for nurse blood pressure visit after taking blood pressure medicine that morning. Return in about 3 months (around 09/04/2023) for follow-up for HTN, HLD, DM, POC A1C at visit.    [name], PA

## 2023-06-05 MED ORDER — LOSARTAN POTASSIUM-HCTZ 100-12.5 MG PO TABS
1.0000 | ORAL_TABLET | Freq: Every day | ORAL | 2 refills | Status: DC
Start: 2023-06-05 — End: 2023-08-24

## 2023-06-08 DIAGNOSIS — M25562 Pain in left knee: Secondary | ICD-10-CM | POA: Diagnosis not present

## 2023-06-11 DIAGNOSIS — M25562 Pain in left knee: Secondary | ICD-10-CM | POA: Diagnosis not present

## 2023-06-12 NOTE — Addendum Note (Signed)
Addended by: Saralyn Pilar on: 06/12/2023 10:21 AM   Modules accepted: Orders

## 2023-06-15 DIAGNOSIS — M25562 Pain in left knee: Secondary | ICD-10-CM | POA: Diagnosis not present

## 2023-06-18 DIAGNOSIS — M25562 Pain in left knee: Secondary | ICD-10-CM | POA: Diagnosis not present

## 2023-06-19 NOTE — Progress Notes (Signed)
Remote pacemaker transmission.   

## 2023-06-22 DIAGNOSIS — M25562 Pain in left knee: Secondary | ICD-10-CM | POA: Diagnosis not present

## 2023-06-23 ENCOUNTER — Encounter: Payer: Self-pay | Admitting: Family Medicine

## 2023-06-25 DIAGNOSIS — M25562 Pain in left knee: Secondary | ICD-10-CM | POA: Diagnosis not present

## 2023-07-13 DIAGNOSIS — M25562 Pain in left knee: Secondary | ICD-10-CM | POA: Diagnosis not present

## 2023-07-13 DIAGNOSIS — M792 Neuralgia and neuritis, unspecified: Secondary | ICD-10-CM | POA: Diagnosis not present

## 2023-07-13 DIAGNOSIS — M5459 Other low back pain: Secondary | ICD-10-CM | POA: Diagnosis not present

## 2023-07-14 ENCOUNTER — Encounter: Payer: Self-pay | Admitting: Cardiovascular Disease

## 2023-07-23 DIAGNOSIS — M1712 Unilateral primary osteoarthritis, left knee: Secondary | ICD-10-CM | POA: Diagnosis not present

## 2023-07-31 ENCOUNTER — Encounter: Payer: Self-pay | Admitting: Family Medicine

## 2023-08-06 DIAGNOSIS — M48061 Spinal stenosis, lumbar region without neurogenic claudication: Secondary | ICD-10-CM | POA: Diagnosis not present

## 2023-08-19 DIAGNOSIS — M7918 Myalgia, other site: Secondary | ICD-10-CM | POA: Diagnosis not present

## 2023-08-19 DIAGNOSIS — M5416 Radiculopathy, lumbar region: Secondary | ICD-10-CM | POA: Diagnosis not present

## 2023-08-19 DIAGNOSIS — M25562 Pain in left knee: Secondary | ICD-10-CM | POA: Diagnosis not present

## 2023-08-20 DIAGNOSIS — H40013 Open angle with borderline findings, low risk, bilateral: Secondary | ICD-10-CM | POA: Diagnosis not present

## 2023-08-20 DIAGNOSIS — Z961 Presence of intraocular lens: Secondary | ICD-10-CM | POA: Diagnosis not present

## 2023-08-20 LAB — HM DIABETES EYE EXAM

## 2023-08-24 ENCOUNTER — Other Ambulatory Visit: Payer: Self-pay | Admitting: Family Medicine

## 2023-08-24 DIAGNOSIS — E1159 Type 2 diabetes mellitus with other circulatory complications: Secondary | ICD-10-CM

## 2023-09-04 ENCOUNTER — Ambulatory Visit (INDEPENDENT_AMBULATORY_CARE_PROVIDER_SITE_OTHER): Payer: Medicare Other

## 2023-09-04 DIAGNOSIS — I442 Atrioventricular block, complete: Secondary | ICD-10-CM

## 2023-09-04 LAB — CUP PACEART REMOTE DEVICE CHECK
Battery Remaining Longevity: 58 mo
Battery Remaining Percentage: 49 %
Battery Voltage: 2.98 V
Brady Statistic AP VP Percent: 26 %
Brady Statistic AP VS Percent: 1 %
Brady Statistic AS VP Percent: 72 %
Brady Statistic AS VS Percent: 1 %
Brady Statistic RA Percent Paced: 24 %
Brady Statistic RV Percent Paced: 98 %
Date Time Interrogation Session: 20250103033537
Implantable Lead Connection Status: 753985
Implantable Lead Connection Status: 753985
Implantable Lead Implant Date: 20090401
Implantable Lead Implant Date: 20090401
Implantable Lead Location: 753859
Implantable Lead Location: 753860
Implantable Pulse Generator Implant Date: 20190925
Lead Channel Impedance Value: 410 Ohm
Lead Channel Impedance Value: 460 Ohm
Lead Channel Pacing Threshold Amplitude: 0.75 V
Lead Channel Pacing Threshold Amplitude: 1 V
Lead Channel Pacing Threshold Pulse Width: 0.5 ms
Lead Channel Pacing Threshold Pulse Width: 0.5 ms
Lead Channel Sensing Intrinsic Amplitude: 12 mV
Lead Channel Sensing Intrinsic Amplitude: 2.1 mV
Lead Channel Setting Pacing Amplitude: 1 V
Lead Channel Setting Pacing Amplitude: 2 V
Lead Channel Setting Pacing Pulse Width: 0.5 ms
Lead Channel Setting Sensing Sensitivity: 4 mV
Pulse Gen Model: 2272
Pulse Gen Serial Number: 9064942

## 2023-09-05 ENCOUNTER — Other Ambulatory Visit: Payer: Self-pay | Admitting: Family Medicine

## 2023-09-05 ENCOUNTER — Encounter: Payer: Self-pay | Admitting: Family Medicine

## 2023-09-05 DIAGNOSIS — E1169 Type 2 diabetes mellitus with other specified complication: Secondary | ICD-10-CM

## 2023-09-07 ENCOUNTER — Ambulatory Visit (INDEPENDENT_AMBULATORY_CARE_PROVIDER_SITE_OTHER): Payer: Medicare Other | Admitting: Family Medicine

## 2023-09-07 ENCOUNTER — Encounter: Payer: Self-pay | Admitting: Family Medicine

## 2023-09-07 VITALS — BP 114/69 | HR 68 | Ht 65.0 in | Wt 163.2 lb

## 2023-09-07 DIAGNOSIS — H60542 Acute eczematoid otitis externa, left ear: Secondary | ICD-10-CM | POA: Insufficient documentation

## 2023-09-07 DIAGNOSIS — E1169 Type 2 diabetes mellitus with other specified complication: Secondary | ICD-10-CM

## 2023-09-07 DIAGNOSIS — I152 Hypertension secondary to endocrine disorders: Secondary | ICD-10-CM

## 2023-09-07 DIAGNOSIS — E119 Type 2 diabetes mellitus without complications: Secondary | ICD-10-CM | POA: Diagnosis not present

## 2023-09-07 DIAGNOSIS — E1159 Type 2 diabetes mellitus with other circulatory complications: Secondary | ICD-10-CM | POA: Diagnosis not present

## 2023-09-07 DIAGNOSIS — E785 Hyperlipidemia, unspecified: Secondary | ICD-10-CM

## 2023-09-07 LAB — POCT GLYCOSYLATED HEMOGLOBIN (HGB A1C): Hemoglobin A1C: 6.6 % — AB (ref 4.0–5.6)

## 2023-09-07 MED ORDER — FLUOCINOLONE ACETONIDE 0.01 % OT OIL: 5.0000 [drp] | TOPICAL_OIL | Freq: Two times a day (BID) | OTIC | 6 refills | Status: DC | PRN

## 2023-09-07 MED ORDER — LOSARTAN POTASSIUM-HCTZ 100-12.5 MG PO TABS: 1.0000 | ORAL_TABLET | Freq: Every day | ORAL | 0 refills | Status: DC

## 2023-09-07 NOTE — Assessment & Plan Note (Signed)
 Last lipid panel: LDL 64, HDL 60, triglycerides 185.  Last CMP within normal limits.  Continue pravastatin 40 mg daily and following heart healthy diet.  Will continue to monitor.

## 2023-09-07 NOTE — Assessment & Plan Note (Addendum)
 A1c 6.6.  Diet controlled.  Recommend to continue ambulatory glucose monitoring.  Continue low carbohydrate/glucose diet.

## 2023-09-07 NOTE — Assessment & Plan Note (Addendum)
 BP goal <140/90.  Stable, at goal.  Continue losartan 100 mg daily.  Last CMP within normal limits.  Will continue to monitor.

## 2023-09-07 NOTE — Assessment & Plan Note (Signed)
 No signs of otitis externa.  Suspect irritation from hearing aids as well as very mild eczema particularly of the left ear.  Use fluocinolone  oil as needed for eczema flares until resolution, then stop use.  Continue using the lubricant recommended by the hearing aid tech when inserting the hearing aids.

## 2023-09-07 NOTE — Progress Notes (Signed)
 Established Patient Office Visit  Subjective   [name], female    DOB: [birth year]  Age: 83 y.o. MRN: [id number]  Chief Complaint  Patient presents with   Diabetes    HPI [name] is a 83 y.o. female presenting today for follow up of hypertension, hyperlipidemia, diabetes.  It has also been noted that her left ear has been itching for a couple of months without relief from Debrox drops.  It is bothersome enough that she has not been wearing her hearing aids as frequently.  She does have a lubricant that was recommended by the hearing aid tech to put on the hearing aids prior to insertion. Hypertension: Pt denies chest pain, SOB, dizziness, edema, syncope, fatigue or heart palpitations. Taking losartan -hydrochlorothiazide , reports excellent compliance with treatment. Denies side effects. Hyperlipidemia: tolerating pravastatin  well with no myalgias or significant side effects.  The ASCVD Risk score (Arnett DK, et al., 2019) failed to calculate for the following reasons:   The 2019 ASCVD risk score is only valid for ages 30 to 77 Diabetes: denies hypoglycemic events, wounds or sores that are not healing well, increased thirst or urination. Denies vision problems, eye exam up-to-date.  Currently managing with low-carb/sugar diet.  Outpatient Medications Prior to Visit  Medication Sig   aspirin  EC 81 MG tablet Take 81 mg by mouth daily. Swallow whole.   diclofenac  Sodium (VOLTAREN ) 1 % GEL Apply 4 g topically 4 (four) times daily.   furosemide  (LASIX ) 40 MG tablet TAKE 1 TABLET DAILY. MAY TAKE AN EXTRA TABLET FOR WEIGHT GAIN OF 2 LB IN A DAY & 5 LBS IN A WEEK   Multiple Vitamins-Minerals (CENTRUM SILVER 50+WOMEN PO) Take 1 tablet by mouth daily at 6 (six) AM.   OXYGEN  2lpm with sleep and exertion [employer]   pravastatin  (PRAVACHOL ) 40 MG tablet TAKE 1 TABLET BY MOUTH EVERY DAY   Respiratory Therapy Supplies (FLUTTER) DEVI Use flutter device three times a day as  instructed   [DISCONTINUED] losartan -hydrochlorothiazide  (HYZAAR) 100-12.5 MG tablet TAKE 1 TABLET BY MOUTH EVERY DAY   KLOR-CON  M20 20 MEQ tablet TAKE 1 TABLET (20 MEQ TOTAL) BY MOUTH ONCE FOR 1 DOSE.   [DISCONTINUED] budesonide -formoterol  (SYMBICORT ) 80-4.5 MCG/ACT inhaler Inhale 2 puffs into the lungs in the morning and at bedtime. (Patient not taking: Reported on 09/07/2023)   No facility-administered medications prior to visit.    ROS Negative unless otherwise noted in HPI   Objective:     BP 114/69   Pulse 68   Ht 5' 5 (1.651 m)   Wt 163 lb 4 oz (74 kg)   SpO2 92%   BMI 27.17 kg/m   Physical Exam Constitutional:      General: She is not in acute distress.    Appearance: Normal appearance.  HENT:     Head: Normocephalic and atraumatic.     Right Ear: Tympanic membrane and ear canal normal. No drainage or swelling. No middle ear effusion. There is no impacted cerumen.     Left Ear: Tympanic membrane and ear canal normal. No drainage or swelling.  No middle ear effusion. There is no impacted cerumen.     Ears:     Comments: No erythema of auditory canals bilaterally.  Minor crusting/dry skin around auditory canal openings L>R. Cardiovascular:     Rate and Rhythm: Normal rate and regular rhythm.     Heart sounds: No murmur heard.    No friction rub. No gallop.  Pulmonary:  Effort: Pulmonary effort is normal. No respiratory distress.     Breath sounds: No wheezing, rhonchi or rales.  Skin:    General: Skin is warm and dry.  Neurological:     Mental Status: She is alert and oriented to person, place, and time.      Assessment & Plan:  Hypertension associated with diabetes (HCC) Assessment & Plan: BP goal <140/90.  Stable, at goal.  Continue losartan  100 mg daily.  Last CMP within normal limits.  Will continue to monitor.  Orders: -     Losartan  Potassium-HCTZ; Take 1 tablet by mouth daily.  Dispense: 90 tablet; Refill: 0  Hyperlipidemia associated with type 2  diabetes mellitus (HCC) Assessment & Plan: Last lipid panel: LDL 64, HDL 60, triglycerides 185.  Last CMP within normal limits.  Continue pravastatin  40 mg daily and following heart healthy diet.  Will continue to monitor.   Controlled type 2 diabetes mellitus without complication, without long-term current use of insulin  (HCC) Assessment & Plan: A1c 6.6.  Diet controlled.  Recommend to continue ambulatory glucose monitoring.  Continue low carbohydrate/glucose diet.  Orders: -     POCT glycosylated hemoglobin (Hb A1C)  Eczema of left external ear Assessment & Plan: No signs of otitis externa.  Suspect irritation from hearing aids as well as very mild eczema particularly of the left ear.  Use fluocinolone  oil as needed for eczema flares until resolution, then stop use.  Continue using the lubricant recommended by the hearing aid tech when inserting the hearing aids.  Orders: -     Fluocinolone  Acetonide; Place 5 drops in ear(s) 2 (two) times daily as needed. Use finger to smear last drop around ear canal opening on the outside. Use on an as-needed basis for eczema flares.  Dispense: 20 mL; Refill: 6    Return in about 4 months (around 01/05/2024) for follow-up for HTN, HLD, DM, fasting blood work 1 week before.    Joesph DELENA Sear, PA

## 2023-09-07 NOTE — Patient Instructions (Addendum)
 Continue using the lubricant recommended by your hearing aid specialist. You may also continue to use the Debrox drops to help soften earwax and prevent buildup.  I sent a prescription for an oil that contains a medicine called fluocinolone  that will help to decrease itching and flares of eczema.  Use this until the itching subsides, then we can stop use.  Keeping the ear moisturized and using the lubricant every time that you insert the hearing aids should prevent irritation in the future.

## 2023-10-08 ENCOUNTER — Encounter: Payer: Self-pay | Admitting: Family Medicine

## 2023-10-09 NOTE — Progress Notes (Signed)
 Remote pacemaker transmission.

## 2023-10-19 ENCOUNTER — Encounter: Payer: Self-pay | Admitting: Family Medicine

## 2023-11-25 ENCOUNTER — Other Ambulatory Visit: Payer: Self-pay | Admitting: Cardiovascular Disease

## 2023-12-07 ENCOUNTER — Ambulatory Visit: Payer: Medicare Other

## 2023-12-07 DIAGNOSIS — I442 Atrioventricular block, complete: Secondary | ICD-10-CM | POA: Diagnosis not present

## 2023-12-08 LAB — CUP PACEART REMOTE DEVICE CHECK
Battery Remaining Longevity: 54 mo
Battery Remaining Percentage: 46 %
Battery Voltage: 2.98 V
Brady Statistic AP VP Percent: 25 %
Brady Statistic AP VS Percent: 1 %
Brady Statistic AS VP Percent: 73 %
Brady Statistic AS VS Percent: 1 %
Brady Statistic RA Percent Paced: 23 %
Brady Statistic RV Percent Paced: 98 %
Date Time Interrogation Session: 20250407020014
Implantable Lead Connection Status: 753985
Implantable Lead Connection Status: 753985
Implantable Lead Implant Date: 20090401
Implantable Lead Implant Date: 20090401
Implantable Lead Location: 753859
Implantable Lead Location: 753860
Implantable Pulse Generator Implant Date: 20190925
Lead Channel Impedance Value: 440 Ohm
Lead Channel Impedance Value: 440 Ohm
Lead Channel Pacing Threshold Amplitude: 0.625 V
Lead Channel Pacing Threshold Amplitude: 1 V
Lead Channel Pacing Threshold Pulse Width: 0.5 ms
Lead Channel Pacing Threshold Pulse Width: 0.5 ms
Lead Channel Sensing Intrinsic Amplitude: 12 mV
Lead Channel Sensing Intrinsic Amplitude: 2 mV
Lead Channel Setting Pacing Amplitude: 0.875
Lead Channel Setting Pacing Amplitude: 2 V
Lead Channel Setting Pacing Pulse Width: 0.5 ms
Lead Channel Setting Sensing Sensitivity: 4 mV
Pulse Gen Model: 2272
Pulse Gen Serial Number: 9064942

## 2023-12-09 ENCOUNTER — Encounter: Payer: Self-pay | Admitting: Cardiovascular Disease

## 2023-12-17 ENCOUNTER — Ambulatory Visit (INDEPENDENT_AMBULATORY_CARE_PROVIDER_SITE_OTHER): Payer: Medicare Other

## 2023-12-17 DIAGNOSIS — E2839 Other primary ovarian failure: Secondary | ICD-10-CM | POA: Diagnosis not present

## 2023-12-17 DIAGNOSIS — Z0000 Encounter for general adult medical examination without abnormal findings: Secondary | ICD-10-CM

## 2023-12-17 NOTE — Progress Notes (Signed)
 Subjective:   [name] is a 83 y.o. who presents for a Medicare Wellness preventive visit.  Visit Complete: Virtual I connected with  [name] on 12/17/23 by a audio enabled telemedicine application and verified that I am speaking with the correct person using two identifiers.  Patient Location: Home  Provider Location: Home Office  I discussed the limitations of evaluation and management by telemedicine. The patient expressed understanding and agreed to proceed.  Vital Signs: Because this visit was a virtual/telehealth visit, some criteria may be missing or patient reported. Any vitals not documented were not able to be obtained and vitals that have been documented are patient reported.  VideoError- Librarian, academic were attempted between this provider and patient, however failed, due to patient having technical difficulties OR patient did not have access to video capability.  We continued and completed visit with audio only.   Persons Participating in Visit: Patient assisted by brother.  AWV Questionnaire: No: Patient Medicare AWV questionnaire was not completed prior to this visit.  Cardiac Risk Factors include: advanced age (>59men, >26 women);diabetes mellitus;dyslipidemia;hypertension;female gender     Objective:    Today's Vitals   There is no height or weight on file to calculate BMI.     12/17/2023    2:35 PM 05/30/2021   11:05 PM 05/27/2021   12:55 PM 07/29/2019    3:06 AM 07/28/2019    9:37 PM 09/21/2018    3:40 PM 07/01/2018    6:00 PM  Advanced Directives  Does Patient Have a Medical Advance Directive? No Yes Yes No No No No  Type of Science writer of [place];Living will      Does patient want to make changes to medical advance directive?  No - Patient declined       Copy of Healthcare Power of Attorney in Chart?  No - copy requested       Would patient like information on  creating a medical advance directive?    No - Patient declined No - Patient declined No - Patient declined No - Patient declined    Current Medications (verified) Outpatient Encounter Medications as of 12/17/2023  Medication Sig   acetaminophen (TYLENOL) 500 MG tablet Take 500 mg by mouth every 6 (six) hours as needed.   aspirin EC 81 MG tablet Take 81 mg by mouth daily. Swallow whole.   AZO CRANBERRY GUMMIES PO Take by mouth. Takes 2 gummies once daily   Fluocinolone Acetonide 0.01 % OIL Place 5 drops in ear(s) 2 (two) times daily as needed. Use finger to smear last drop around ear canal opening on the outside. Use on an as-needed basis for eczema flares.   furosemide (LASIX) 40 MG tablet TAKE 1 TABLET DAILY. MAY TAKE AN EXTRA TABLET FOR WEIGHT GAIN OF 2 LB IN A DAY & 5 LBS IN A WEEK   losartan-hydrochlorothiazide (HYZAAR) 100-12.5 MG tablet Take 1 tablet by mouth daily.   Multiple Vitamins-Minerals (CENTRUM SILVER 50+WOMEN PO) Take 1 tablet by mouth daily at 6 (six) AM.   OXYGEN 2lpm with sleep and exertion [employer]   pravastatin (PRAVACHOL) 40 MG tablet TAKE 1 TABLET BY MOUTH EVERY DAY   Psyllium (METAMUCIL PO) Take by mouth. Takes 1 gummie daily   Respiratory Therapy Supplies (FLUTTER) DEVI Use flutter device three times a day as instructed   diclofenac Sodium (VOLTAREN) 1 % GEL Apply 4 g topically 4 (four) times daily. (Patient not  taking: Reported on 12/17/2023)   KLOR-CON M20 20 MEQ tablet TAKE 1 TABLET (20 MEQ TOTAL) BY MOUTH ONCE FOR 1 DOSE. (Patient taking differently: Take 20 mEq by mouth daily.)   No facility-administered encounter medications on file as of 12/17/2023.    Allergies (verified) Patient has no known allergies.   History: Past Medical History:  Diagnosis Date   Arthritis    "hands, spine" (12/07/2014)   CHB (complete heart block) (HCC) 09/19/2013   CHF (congestive heart failure) (HCC) dx'd 12/06/2014   Chronic pain    "since neck OR in 2007"   Coronary artery  disease    normal coronaries by 09/10/00 cath   Diabetes mellitus    hga1c 7.1 no meds   Dysrhythmia    HTN (hypertension) 09/19/2013   Hypercholesterolemia    Hyperlipidemia 09/19/2013   Hypertension    Myocardial infarction [facility]) dx'd 2009   Neuropathy of both feet    Pacemaker    Paralysis of right hand (HCC) 2007   "after spinal cord injury/OR"   Shortness of breath    exertional   Type II diabetes mellitus (HCC)    "had it years ago; lost weight after neck OR in 2007; glucose went way down; recently dx'd as returned recently" (12/07/2014)   Urinary incontinence    "since 2007's OR"   Venous insufficiency (chronic) (peripheral)    Past Surgical History:  Procedure Laterality Date   ANTERIOR CERVICAL DECOMP/DISCECTOMY FUSION  2007   BACK SURGERY     BIOPSY  07/29/2019   Procedure: BIOPSY;  Surgeon: Meryl Dare, MD;  Location: [facility] ENDOSCOPY;  Service: Endoscopy;;   CARDIAC CATHETERIZATION  09/10/2000   normal Coronary arteries, EF60%,    CARDIAC PACEMAKER PLACEMENT  12/01/2007   [employer] Zephyr Carlynn Purl #[id number], dual chamber   CARDIOVASCULAR STRESS TEST  08/28/2000   Cardiolite perfusion study, EF58%, low risk study,    DILATION AND CURETTAGE OF UTERUS  1960's   ESOPHAGOGASTRODUODENOSCOPY N/A 07/29/2019   Procedure: ESOPHAGOGASTRODUODENOSCOPY (EGD);  Surgeon: Meryl Dare, MD;  Location: [facility] ENDOSCOPY;  Service: Endoscopy;  Laterality: N/A;   HOT HEMOSTASIS N/A 07/29/2019   Procedure: HOT HEMOSTASIS (ARGON PLASMA COAGULATION/BICAP);  Surgeon: Meryl Dare, MD;  Location[facility] ENDOSCOPY;  Service: Endoscopy;  Laterality: N/A;   KNEE ARTHROSCOPY Right    LAPAROSCOPIC CHOLECYSTECTOMY  1995   LEFT AND RIGHT HEART CATHETERIZATION WITH CORONARY ANGIOGRAM N/A 12/14/2014   Procedure: LEFT AND RIGHT HEART CATHETERIZATION WITH CORONARY ANGIOGRAM;  Surgeon: Peter M Swaziland, MD;  Location: [facility] CATH LAB;  Service: Cardiovascular;  Laterality: N/A;   Lower Ext. Dopplers  06/12/2011   no  evidence of thrombus, all vessels normal in size, no evidence of insuffiency   LUMBAR LAMINECTOMY/DECOMPRESSION MICRODISCECTOMY  03/02/2012   Procedure: LUMBAR LAMINECTOMY/DECOMPRESSION MICRODISCECTOMY 2 LEVELS;  Surgeon: Karn Cassis, MD;  Location: [facility] NEURO ORS;  Service: Neurosurgery;  Laterality: Bilateral;  Lumbar three, lumbar four, lumbar five Laminectomy/Foraminotomy   MOLE REMOVAL     left side of face near her eye   NM MYOCAR PERF WALL MOTION  10/24/2008   protocol:Lexiscan, EF63%, normal study, scan negative for ischemia   PPM GENERATOR CHANGEOUT N/A 05/26/2018   Procedure: PPM GENERATOR CHANGEOUT;  Surgeon: Thurmon Fair, MD;  Location: [facility] INVASIVE CV LAB;  Service: Cardiovascular;  Laterality: N/A;   TRANSTHORACIC ECHOCARDIOGRAM  11/30/2007   normal echo, mean transaortic valve gradient   Family History  Problem Relation Age of Onset   Diabetes Father  Heart disease Father        before age 13   Hyperlipidemia Father    Hypertension Father    Heart attack Father    Congestive Heart Failure Father    Diabetes Mother    Heart disease Mother    Hyperlipidemia Mother    Hypertension Mother    Congestive Heart Failure Mother    Diabetes Brother    Congestive Heart Failure Brother    Colon cancer Neg Hx    Esophageal cancer Neg Hx    Rectal cancer Neg Hx    Social History   Socioeconomic History   Marital status: Widowed    Spouse name: Not on file   Number of children: 2   Years of education: Not on file   Highest education level: 12th grade  Occupational History   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  Tobacco Use   Smoking status: Former    Current packs/day: 0.00    Average packs/day: 0.5 packs/day for 32.5 years (16.2 ttl pk-yrs)    Types: Cigarettes    Start date: 1963    Quit date: 03/02/1994    Years since quitting: 29.8    Passive exposure: Never   Smokeless tobacco: Never  Vaping Use   Vaping status: Never Used  Substance  and Sexual Activity   Alcohol use: No   Drug use: No   Sexual activity: Not Currently    Birth control/protection: None  Other Topics Concern   Not on file  Social History Narrative   Not on file   Social Drivers of Health   Financial Resource Strain: Low Risk  (12/17/2023)   Overall Financial Resource Strain (CARDIA)    Difficulty of Paying Living Expenses: Not hard at all  Food Insecurity: No Food Insecurity (12/17/2023)   Hunger Vital Sign    Worried About Running Out of Food in the Last Year: Never true    Ran Out of Food in the Last Year: Never true  Transportation Needs: No Transportation Needs (12/17/2023)   PRAPARE - Administrator, Civil Service (Medical): No    Lack of Transportation (Non-Medical): No  Physical Activity: Insufficiently Active (12/17/2023)   Exercise Vital Sign    Days of Exercise per Week: 7 days    Minutes of Exercise per Session: 10 min  Stress: No Stress Concern Present (12/17/2023)   [employer] of Occupational Health - Occupational Stress Questionnaire    Feeling of Stress : Not at all  Social Connections: Socially Isolated (12/17/2023)   Social Connection and Isolation Panel [NHANES]    Frequency of Communication with Friends and Family: More than three times a week    Frequency of Social Gatherings with Friends and Family: More than three times a week    Attends Religious Services: Never    Database administrator or Organizations: No    Attends Banker Meetings: Never    Marital Status: Widowed    Tobacco Counseling Counseling given: Not Answered    Clinical Intake:  Pre-visit preparation completed: Yes  Pain : No/denies pain     Nutritional Risks: None Diabetes: Yes CBG done?: No Did pt. bring in CBG monitor from home?: No  Lab Results  Component Value Date   HGBA1C 6.6 (A) 09/07/2023   HGBA1C 6.2 (H) 05/29/2023   HGBA1C 6.3 (H) 03/04/2023     How often do you need to have someone help you when  you read instructions, pamphlets, or other written  materials from your doctor or pharmacy?: 1 - Never  Interpreter Needed?: No  Information entered by :: NAllen LPN   Activities of Daily Living     12/17/2023    2:24 PM  In your present state of health, do you have any difficulty performing the following activities:  Hearing? 1  Comment has hearing aids, but does not wear all the time  Vision? 0  Difficulty concentrating or making decisions? 1  Comment some short term  Walking or climbing stairs? 1  Comment needs help  Dressing or bathing? 1  Doing errands, shopping? 1  Preparing Food and eating ? N  Using the Toilet? N  In the past six months, have you accidently leaked urine? Y  Comment wears pull ups  Do you have problems with loss of bowel control? N  Managing your Medications? N  Managing your Finances? N  Housekeeping or managing your Housekeeping? N    Patient Care Team: [name], PA as PCP - General (Family Medicine) [name], Rachelle Hora, MD as PCP - Cardiology (Cardiology)  Indicate any recent Medical Services you may have received from other than [facility] providers in the past year (date may be approximate).     Assessment:   This is a routine wellness examination for [name].  Hearing/Vision screen Hearing Screening - Comments:: Has hearing aids that are maintained Vision Screening - Comments:: Regular eye exams, [facility]   Goals Addressed             This Visit's Progress    Patient Stated       12/17/2023, continue walking       Depression Screen     12/17/2023    2:38 PM 09/07/2023    1:32 PM 11/10/2022    9:56 AM 07/10/2022    9:15 AM 04/07/2022    4:16 PM 11/21/2021    4:08 PM 08/22/2021    3:23 PM  PHQ 2/9 Scores  PHQ - 2 Score 0 0 0 0 0 0 1  PHQ- 9 Score 0 0  0 1 0 3    Fall Risk     12/17/2023    2:37 PM 09/07/2023    1:32 PM 11/10/2022    9:56 AM 07/10/2022    9:15 AM 04/07/2022    4:15 PM  Fall Risk   Falls in the past year? 0 0  1 1 0  Number falls in past yr: 0 0 1  0  Injury with Fall? 0 0 0  0  Risk for fall due to : Medication side effect;Impaired mobility;Impaired balance/gait No Fall Risks History of fall(s);Impaired balance/gait;Orthopedic patient  No Fall Risks  Follow up Falls prevention discussed;Falls evaluation completed Falls evaluation completed   Falls evaluation completed    MEDICARE RISK AT HOME:  Medicare Risk at Home Any stairs in or around the home?: Yes If so, are there any without handrails?: No Home free of loose throw rugs in walkways, pet beds, electrical cords, etc?: Yes Adequate lighting in your home to reduce risk of falls?: Yes Life alert?: No Use of a cane, walker or w/c?: Yes Grab bars in the bathroom?: Yes Shower chair or bench in shower?: Yes Elevated toilet seat or a handicapped toilet?: No  TIMED UP AND GO:  Was the test performed?  No  Cognitive Function: 6CIT completed        12/17/2023    2:39 PM 11/10/2022    9:57 AM 05/30/2021   11:09 PM  6CIT Screen  What Year? 0 points 0 points 0 points  What month? 0 points 0 points 0 points  What time? 0 points 0 points 0 points  Count back from 20 0 points 0 points 0 points  Months in reverse 4 points 0 points 0 points  Repeat phrase 0 points 0 points 0 points  Total Score 4 points 0 points 0 points    Immunizations Immunization History  Administered Date(s) Administered   Fluad Quad(high Dose 65+) 06/04/2022, 05/28/2023   Fluad Trivalent(High Dose 65+) 05/28/2023   Influenza Split 06/01/2014, 06/02/2015   Influenza, High Dose Seasonal PF 06/04/2018   Moderna Covid-19 Fall Seasonal Vaccine 33yrs & older 12/16/2022, 05/28/2023   Moderna Sars-Covid-2 Vaccination 04/03/2020, 04/30/2020   PFIZER Comirnaty(Gray Top)Covid-19 Tri-Sucrose Vaccine 12/13/2020   PFIZER(Purple Top)SARS-COV-2 Vaccination 06/25/2020, 05/28/2023   Pfizer Covid-19 Vaccine Bivalent Booster 49yrs & up 05/25/2021, 12/23/2021   Pfizer(Comirnaty)Fall  Seasonal Vaccine 12 years and older 06/04/2022   Pneumococcal Polysaccharide-23 06/01/2006   Tdap 06/22/2023   Unspecified SARS-COV-2 Vaccination 06/04/2022   Zoster Recombinant(Shingrix) 10/08/2023    Screening Tests Health Maintenance  Topic Date Due   DEXA SCAN  Never done   Pneumonia Vaccine 37+ Years old (2 of 2 - PCV) 06/02/2007   COVID-19 Vaccine (10 - 2024-25 season) 07/23/2023   Diabetic kidney evaluation - Urine ACR  11/10/2023   Zoster Vaccines- Shingrix (2 of 2) 12/03/2023   HEMOGLOBIN A1C  03/06/2024   INFLUENZA VACCINE  04/01/2024   Diabetic kidney evaluation - eGFR measurement  05/28/2024   FOOT EXAM  06/03/2024   OPHTHALMOLOGY EXAM  08/19/2024   Medicare Annual Wellness (AWV)  12/16/2024   DTaP/Tdap/Td (2 - Td or Tdap) 06/21/2033   HPV VACCINES  Aged Out   Meningococcal B Vaccine  Aged Out    Health Maintenance  Health Maintenance Due  Topic Date Due   DEXA SCAN  Never done   Pneumonia Vaccine 78+ Years old (2 of 2 - PCV) 06/02/2007   COVID-19 Vaccine (10 - 2024-25 season) 07/23/2023   Diabetic kidney evaluation - Urine ACR  11/10/2023   Zoster Vaccines- Shingrix (2 of 2) 12/03/2023   Health Maintenance Items Addressed: Due for pneumonia and second shingrix dose.  Additional Screening:  Vision Screening: Recommended annual ophthalmology exams for early detection of glaucoma and other disorders of the eye.  Dental Screening: Recommended annual dental exams for proper oral hygiene  Community Resource Referral / Chronic Care Management: CRR required this visit?  No   CCM required this visit?  No     Plan:     I have personally reviewed and noted the following in the patient's chart:   Medical and social history Use of alcohol, tobacco or illicit drugs  Current medications and supplements including opioid prescriptions. Patient is not currently taking opioid prescriptions. Functional ability and status Nutritional status Physical  activity Advanced directives List of other physicians Hospitalizations, surgeries, and ER visits in previous 12 months Vitals Screenings to include cognitive, depression, and falls Referrals and appointments  In addition, I have reviewed and discussed with patient certain preventive protocols, quality metrics, and best practice recommendations. A written personalized care plan for preventive services as well as general preventive health recommendations were provided to patient.     Barb Merino, LPN   [id number]/[id number]   After Visit Summary: (MyChart) Due to this being a telephonic visit, the after visit summary with patients personalized plan was offered to patient via MyChart   Notes:  Nothing significant to report at this time.

## 2023-12-17 NOTE — Patient Instructions (Signed)
 [name] , Thank you for taking time to come for your Medicare Wellness Visit. I appreciate your ongoing commitment to your health goals. Please review the following plan we discussed and let me know if I can assist you in the future.   Referrals/Orders/Follow-Ups/Clinician Recommendations: ordered bone density  This is a list of the screening recommended for you and due dates:  Health Maintenance  Topic Date Due   DEXA scan (bone density measurement)  Never done   Pneumonia Vaccine (2 of 2 - PCV) 06/02/2007   COVID-19 Vaccine (10 - 2024-25 season) 07/23/2023   Yearly kidney health urinalysis for diabetes  11/10/2023   Zoster (Shingles) Vaccine (2 of 2) 12/03/2023   Hemoglobin A1C  03/06/2024   Flu Shot  04/01/2024   Yearly kidney function blood test for diabetes  05/28/2024   Complete foot exam   06/03/2024   Eye exam for diabetics  08/19/2024   Medicare Annual Wellness Visit  12/16/2024   DTaP/Tdap/Td vaccine (2 - Td or Tdap) 06/21/2033   HPV Vaccine  Aged Out   Meningitis B Vaccine  Aged Out    Advanced directives: (ACP Link)Information on Advanced Care Planning can be found at [employer] Advance Health Care Directives Advance Health Care Directives. [email or web address]   Next Medicare Annual Wellness Visit scheduled for next year: Yes  insert Preventive Care attachment Insert FALL PREVENTION attachment if needed

## 2023-12-18 ENCOUNTER — Encounter: Payer: Self-pay | Admitting: Family Medicine

## 2023-12-24 ENCOUNTER — Other Ambulatory Visit: Payer: Self-pay | Admitting: *Deleted

## 2023-12-24 DIAGNOSIS — E1159 Type 2 diabetes mellitus with other circulatory complications: Secondary | ICD-10-CM

## 2023-12-24 DIAGNOSIS — E1169 Type 2 diabetes mellitus with other specified complication: Secondary | ICD-10-CM

## 2023-12-24 DIAGNOSIS — E119 Type 2 diabetes mellitus without complications: Secondary | ICD-10-CM

## 2023-12-28 ENCOUNTER — Other Ambulatory Visit: Payer: Medicare Other

## 2023-12-28 DIAGNOSIS — E1169 Type 2 diabetes mellitus with other specified complication: Secondary | ICD-10-CM

## 2023-12-28 DIAGNOSIS — E785 Hyperlipidemia, unspecified: Secondary | ICD-10-CM

## 2023-12-28 DIAGNOSIS — E1159 Type 2 diabetes mellitus with other circulatory complications: Secondary | ICD-10-CM

## 2023-12-28 DIAGNOSIS — I152 Hypertension secondary to endocrine disorders: Secondary | ICD-10-CM

## 2023-12-28 DIAGNOSIS — E119 Type 2 diabetes mellitus without complications: Secondary | ICD-10-CM

## 2023-12-29 LAB — LIPID PANEL

## 2023-12-30 LAB — MICROALBUMIN / CREATININE URINE RATIO
Creatinine, Urine: 93 mg/dL
Microalb/Creat Ratio: 33 mg/g{creat} — ABNORMAL HIGH (ref 0–29)
Microalbumin, Urine: 30.6 ug/mL

## 2023-12-30 LAB — CBC WITH DIFFERENTIAL/PLATELET
Basophils Absolute: 0 10*3/uL (ref 0.0–0.2)
Basos: 0 %
EOS (ABSOLUTE): 0.2 10*3/uL (ref 0.0–0.4)
Eos: 3 %
Hematocrit: 42.6 % (ref 34.0–46.6)
Hemoglobin: 13.7 g/dL (ref 11.1–15.9)
Immature Grans (Abs): 0 10*3/uL (ref 0.0–0.1)
Immature Granulocytes: 0 %
Lymphocytes Absolute: 2.7 10*3/uL (ref 0.7–3.1)
Lymphs: 33 %
MCH: 30.2 pg (ref 26.6–33.0)
MCHC: 32.2 g/dL (ref 31.5–35.7)
MCV: 94 fL (ref 79–97)
Monocytes Absolute: 0.5 10*3/uL (ref 0.1–0.9)
Monocytes: 6 %
Neutrophils Absolute: 4.8 10*3/uL (ref 1.4–7.0)
Neutrophils: 58 %
Platelets: 171 10*3/uL (ref 150–450)
RBC: 4.53 x10E6/uL (ref 3.77–5.28)
RDW: 12.4 % (ref 11.7–15.4)
WBC: 8.2 10*3/uL (ref 3.4–10.8)

## 2023-12-30 LAB — COMPREHENSIVE METABOLIC PANEL WITH GFR
ALT: 12 IU/L (ref 0–32)
AST: 16 IU/L (ref 0–40)
Albumin: 4.5 g/dL (ref 3.7–4.7)
Alkaline Phosphatase: 50 IU/L (ref 44–121)
BUN/Creatinine Ratio: 34 — ABNORMAL HIGH (ref 12–28)
BUN: 23 mg/dL (ref 8–27)
Bilirubin Total: 0.6 mg/dL (ref 0.0–1.2)
CO2: 30 mmol/L — ABNORMAL HIGH (ref 20–29)
Calcium: 9.2 mg/dL (ref 8.7–10.3)
Chloride: 93 mmol/L — ABNORMAL LOW (ref 96–106)
Creatinine, Ser: 0.67 mg/dL (ref 0.57–1.00)
Globulin, Total: 2 g/dL (ref 1.5–4.5)
Glucose: 118 mg/dL — ABNORMAL HIGH (ref 70–99)
Potassium: 3.6 mmol/L (ref 3.5–5.2)
Sodium: 139 mmol/L (ref 134–144)
Total Protein: 6.5 g/dL (ref 6.0–8.5)
eGFR: 86 mL/min/{1.73_m2} (ref >59)

## 2023-12-30 LAB — HEMOGLOBIN A1C
Est. average glucose Bld gHb Est-mCnc: 146 mg/dL
Hgb A1c MFr Bld: 6.7 % — ABNORMAL HIGH (ref 4.8–5.6)

## 2023-12-30 LAB — LIPID PANEL
Cholesterol, Total: 156 mg/dL (ref 100–199)
HDL: 64 mg/dL (ref >39)
LDL CALC COMMENT:: 2.4 ratio (ref 0.0–4.4)
LDL Chol Calc (NIH): 68 mg/dL (ref 0–99)
Triglycerides: 139 mg/dL (ref 0–149)
VLDL Cholesterol Cal: 24 mg/dL (ref 5–40)

## 2024-01-04 ENCOUNTER — Ambulatory Visit: Payer: Medicare Other | Admitting: Family Medicine

## 2024-01-14 ENCOUNTER — Ambulatory Visit (INDEPENDENT_AMBULATORY_CARE_PROVIDER_SITE_OTHER): Admitting: Family Medicine

## 2024-01-14 ENCOUNTER — Encounter: Payer: Self-pay | Admitting: Family Medicine

## 2024-01-14 VITALS — BP 118/72 | HR 60 | Ht 65.0 in | Wt 166.0 lb

## 2024-01-14 DIAGNOSIS — R159 Full incontinence of feces: Secondary | ICD-10-CM

## 2024-01-14 DIAGNOSIS — R152 Fecal urgency: Secondary | ICD-10-CM | POA: Diagnosis not present

## 2024-01-14 DIAGNOSIS — E119 Type 2 diabetes mellitus without complications: Secondary | ICD-10-CM

## 2024-01-14 NOTE — Progress Notes (Signed)
   Established Patient Office Visit  Subjective   [name], female    DOB: June 22, 1941  Age: 83 y.o. MRN: [id number]  Chief Complaint  Patient presents with   Medical Management of Chronic Issues    HPI  Subjective - Fecal incontinence with 30-second warning, not enough time to reach bathroom - Loose, free-flowing stool consistency described as "large mass of heavy oatmeal" - Incontinence episodes often triggered by physical exertion (getting in/out of truck, climbing steps) - At least 1-2 bowel movements daily depending on food intake - All foods seem to trigger bowel movements within 30 minutes of eating - Previously tried loperamide but discontinued due to hemorrhoid pain - Urinary incontinence for several years requiring pull-ups - Fall last Tuesday, landed backwards hitting washing machine - Right-sided lower back pain since fall, worse with movement, sitting, and turning in bed - No visible bruising noted by family members - No pain with deep breathing  Medications: Losartan -hydrochlorothiazide  for blood pressure, furosemide  for fluid retention, potassium supplement, Metamucil chewable gummies (currently taking one daily).  PMH, PSH, FH, Social Hx: Cholecystectomy in 1995, neck surgeries for osteoporosis, two lower back surgeries between 2007-2015, hemorrhoids, diabetes (diet-controlled), hyperlipidemia, fluid retention. Lives at home with son on property, daughter lives 2 miles away. Brother visits twice weekly and takes her out. Limited social interaction due to fecal incontinence concerns.  ROS: Memory issues with short-term recall per brother, repeating questions, forgetting appointments. Hearing deficit with hearing aids obtained this summer but not wearing them. Morning blood sugar readings 130-140 range. Recently received second shingles vaccine and 2025 COVID vaccine with arm soreness.     The ASCVD Risk score (Arnett DK, et al., 2019) failed to  calculate for the following reasons:   The 2019 ASCVD risk score is only valid for ages 93 to 85  Health Maintenance Due  Topic Date Due   DEXA SCAN  Never done   Pneumonia Vaccine 32+ Years old (2 of 2 - PCV) 06/02/2007      Objective:     BP 118/72   Pulse 60   Ht 5\' 5"  (1.651 m)   Wt 166 lb (75.3 kg)   SpO2 95%   BMI 27.62 kg/m    Physical Exam Gen: alert, oriented.  Acommpanied by brother Heent: perrla, eomi Cv: rrr Pulm: lctab Gi: soft, nbs.   Msk: in wheelchair. Ambualtes with walker at home.   Psych: pleasant    No results found for any visits on 01/14/24.      Assessment & Plan:   Incontinence of feces with fecal urgency Assessment & Plan: Worsening issue, affecting her ability to socialize, go out of the house.  Triggered by movements and meals.  Recommended trying to increase metamucil intake (can only take the gummies), and trying lower dose of loperamide.  Pt has concerns about constipation and straining d/t her hemorrhoids.  Brother has questions about an absorbant anal plug like that from [employer].  Does not appear medicare covers these.  Do not see any information on where to order these. Will advise pt to call manufacturer regarding where they can be purchased locally.  Can then send in prescription for this.     Controlled type 2 diabetes mellitus without complication, without long-term current use of insulin  (HCC) Assessment & Plan: Diet controlled.  6.7% A1c today.        Return in about 4 months (around 05/16/2024) for hld, DM, incontinence.    Laneta Pintos, MD

## 2024-01-14 NOTE — Patient Instructions (Signed)
 It was nice to see you today,  We addressed the following topics today: -Try increasing your Metamucil intake.  This will help make the stool larger and less likely to lead to incontinence.  Try to increase it to at least 3 of those Gummies per day. - The other thing to try once adjusting the dose and timing of your loperamide. - If neither of those things work I can prescribe cholestyramine - I will look into finding the right gel suppository to prescribe for you. - Your cholesterol and A1c levels look good today.  Your A1c was 6.7.  Have a great day,  Etha Henle, MD

## 2024-01-18 ENCOUNTER — Telehealth: Payer: Self-pay | Admitting: *Deleted

## 2024-01-18 DIAGNOSIS — R159 Full incontinence of feces: Secondary | ICD-10-CM | POA: Insufficient documentation

## 2024-01-18 NOTE — Assessment & Plan Note (Signed)
 Worsening issue, affecting her ability to socialize, go out of the house.  Triggered by movements and meals.  Recommended trying to increase metamucil intake (can only take the gummies), and trying lower dose of loperamide.  Pt has concerns about constipation and straining d/t her hemorrhoids.  Brother has questions about an absorbant anal plug like that from [employer].  Does not appear medicare covers these.  Do not see any information on where to order these. Will advise pt to call manufacturer regarding where they can be purchased locally.  Can then send in prescription for this.

## 2024-01-18 NOTE — Assessment & Plan Note (Signed)
 Diet controlled.  6.7% A1c today.

## 2024-01-18 NOTE — Progress Notes (Signed)
 Remote pacemaker transmission.

## 2024-01-18 NOTE — Telephone Encounter (Signed)
-----   Message from Laneta Pintos sent at 01/18/2024  8:04 AM EDT ----- Can you call the patient's brother (I cannot remember the name) who accompanied her to the appointment and advise him thathe would need to call the manufacturer, [employer], and ask who is the local suppliers of the absorbant rectal plug for fecal incontinence.  I can then send the order in to that location but it does not appear medicare covers those devices.    Call a Consumer Care team member directly at [phone]. The team is available Monday-Friday 8am to 5pm Central Time

## 2024-01-18 NOTE — Addendum Note (Signed)
 Addended by: Edra Govern D on: 01/18/2024 03:40 PM   Modules accepted: Orders

## 2024-01-18 NOTE — Telephone Encounter (Signed)
 Contacted pts brother and informed him of below and he requested I send the information in [place]

## 2024-01-19 ENCOUNTER — Encounter: Payer: Self-pay | Admitting: Family Medicine

## 2024-01-21 ENCOUNTER — Other Ambulatory Visit: Payer: Self-pay | Admitting: Family Medicine

## 2024-01-21 ENCOUNTER — Ambulatory Visit
Admission: RE | Admit: 2024-01-21 | Discharge: 2024-01-21 | Disposition: A | Discharge status: Home / Self Care | Source: Ambulatory Visit | Attending: Family Medicine | Admitting: Family Medicine

## 2024-01-21 DIAGNOSIS — M545 Low back pain, unspecified: Secondary | ICD-10-CM

## 2024-01-21 MED ORDER — OXYCODONE HCL 5 MG PO TABS: 5.0000 mg | ORAL_TABLET | Freq: Four times a day (QID) | ORAL | 0 refills | Status: AC | PRN

## 2024-01-29 ENCOUNTER — Other Ambulatory Visit: Payer: Self-pay | Admitting: Family Medicine

## 2024-01-29 MED ORDER — DICLOFENAC SODIUM 1 % EX GEL: 2.0000 g | Freq: Four times a day (QID) | CUTANEOUS | 3 refills | Status: AC

## 2024-02-01 ENCOUNTER — Ambulatory Visit: Payer: Self-pay | Admitting: Family Medicine

## 2024-02-03 ENCOUNTER — Other Ambulatory Visit: Payer: Self-pay | Admitting: Family Medicine

## 2024-02-03 DIAGNOSIS — R262 Difficulty in walking, not elsewhere classified: Secondary | ICD-10-CM

## 2024-02-03 DIAGNOSIS — R5381 Other malaise: Secondary | ICD-10-CM

## 2024-02-10 ENCOUNTER — Encounter: Payer: Self-pay | Admitting: Cardiovascular Disease

## 2024-03-08 ENCOUNTER — Ambulatory Visit: Payer: Medicare Other

## 2024-03-08 DIAGNOSIS — I442 Atrioventricular block, complete: Secondary | ICD-10-CM | POA: Diagnosis not present

## 2024-03-09 ENCOUNTER — Telehealth: Payer: Self-pay | Admitting: *Deleted

## 2024-03-09 LAB — CUP PACEART REMOTE DEVICE CHECK
Battery Remaining Longevity: 49 mo
Battery Remaining Percentage: 43 %
Battery Voltage: 2.96 V
Brady Statistic AP VP Percent: 24 %
Brady Statistic AP VS Percent: 1 %
Brady Statistic AS VP Percent: 74 %
Brady Statistic AS VS Percent: 1 %
Brady Statistic RA Percent Paced: 22 %
Brady Statistic RV Percent Paced: 98 %
Date Time Interrogation Session: 20250708020015
Implantable Lead Connection Status: 753985
Implantable Lead Connection Status: 753985
Implantable Lead Implant Date: 20090401
Implantable Lead Implant Date: 20090401
Implantable Lead Location: 753859
Implantable Lead Location: 753860
Implantable Pulse Generator Implant Date: 20190925
Lead Channel Impedance Value: 400 Ohm
Lead Channel Impedance Value: 410 Ohm
Lead Channel Pacing Threshold Amplitude: 0.875 V
Lead Channel Pacing Threshold Amplitude: 1 V
Lead Channel Pacing Threshold Pulse Width: 0.5 ms
Lead Channel Pacing Threshold Pulse Width: 0.5 ms
Lead Channel Sensing Intrinsic Amplitude: 12 mV
Lead Channel Sensing Intrinsic Amplitude: 2.5 mV
Lead Channel Setting Pacing Amplitude: 1.125
Lead Channel Setting Pacing Amplitude: 2 V
Lead Channel Setting Pacing Pulse Width: 0.5 ms
Lead Channel Setting Sensing Sensitivity: 4 mV
Pulse Gen Model: 2272
Pulse Gen Serial Number: 9064942

## 2024-03-09 NOTE — Telephone Encounter (Signed)
 LVM for pt to call office, had some paperwork in reference to an electric lift chair and wanted to check with them about some information.

## 2024-03-14 ENCOUNTER — Ambulatory Visit: Payer: Self-pay | Admitting: Cardiovascular Disease

## 2024-03-16 NOTE — Telephone Encounter (Signed)
 Returned call to patients brother to see if they have been contacted from a company because the form that we have does not have any contact information with it and we had some questions about this. He has not had any communication with anyone so I will reach out to [employer] to see if by chance they have done anything for this order.

## 2024-03-16 NOTE — Telephone Encounter (Signed)
 Ron called back I advised him that April would have to call him back tomorrow [phone].

## 2024-03-17 NOTE — Telephone Encounter (Signed)
 Contacted the [employer] team and they have not had anything recently on this patient. Will try to figure out who sent fax and if not may need to start over with the request.

## 2024-03-21 NOTE — Telephone Encounter (Signed)
 Contacted pt brother and informed them that [employer] no longer does lift chairs and asked him to contact his insurance company to see where we may be able to send the RX/order for a lift chair to.  I did tell him the information below that I had received from the [employer] group.     ---- Message -----  From: Jackson Macintosh  Sent: 03/21/2024   9:09 AM EDT  To: Adine Leer; Macintosh Jackson; Ephraim Dollar*   We no longer have lift chairs in store. The patient can take a paper RX to our retail store and pre-order a lift chair that the patient picks out. I would like to note insurance ONLY covers a percentage of the motor for the chair and not the chair itself. The patient would be responsible for the cost of the chair.

## 2024-04-23 ENCOUNTER — Other Ambulatory Visit: Payer: Self-pay | Admitting: Family Medicine

## 2024-04-23 DIAGNOSIS — I152 Hypertension secondary to endocrine disorders: Secondary | ICD-10-CM

## 2024-04-29 ENCOUNTER — Other Ambulatory Visit: Payer: Self-pay | Admitting: Family Medicine

## 2024-04-29 DIAGNOSIS — E1169 Type 2 diabetes mellitus with other specified complication: Secondary | ICD-10-CM

## 2024-05-05 ENCOUNTER — Encounter: Payer: Self-pay | Admitting: Cardiovascular Disease

## 2024-05-05 ENCOUNTER — Ambulatory Visit: Attending: Cardiovascular Disease | Admitting: Cardiovascular Disease

## 2024-05-05 VITALS — BP 116/63 | HR 68 | Ht 65.0 in | Wt 161.2 lb

## 2024-05-05 DIAGNOSIS — Z862 Personal history of diseases of the blood and blood-forming organs and certain disorders involving the immune mechanism: Secondary | ICD-10-CM | POA: Insufficient documentation

## 2024-05-05 DIAGNOSIS — I4719 Other supraventricular tachycardia: Secondary | ICD-10-CM | POA: Insufficient documentation

## 2024-05-05 DIAGNOSIS — I2721 Secondary pulmonary arterial hypertension: Secondary | ICD-10-CM | POA: Insufficient documentation

## 2024-05-05 DIAGNOSIS — I442 Atrioventricular block, complete: Secondary | ICD-10-CM | POA: Insufficient documentation

## 2024-05-05 DIAGNOSIS — E7800 Pure hypercholesterolemia, unspecified: Secondary | ICD-10-CM | POA: Insufficient documentation

## 2024-05-05 DIAGNOSIS — J449 Chronic obstructive pulmonary disease, unspecified: Secondary | ICD-10-CM | POA: Diagnosis present

## 2024-05-05 DIAGNOSIS — R7303 Prediabetes: Secondary | ICD-10-CM | POA: Insufficient documentation

## 2024-05-05 DIAGNOSIS — I2510 Atherosclerotic heart disease of native coronary artery without angina pectoris: Secondary | ICD-10-CM | POA: Insufficient documentation

## 2024-05-05 DIAGNOSIS — I5032 Chronic diastolic (congestive) heart failure: Secondary | ICD-10-CM | POA: Insufficient documentation

## 2024-05-05 NOTE — Patient Instructions (Signed)

## 2024-05-05 NOTE — Progress Notes (Signed)
 [name], female   DOB: September 22, 1940, 83 y.o.   MRN: [id number]    Cardiology Office Note    Date:  05/05/2024   ID:  [name], [name] 1940-11-05, MRN [id number]  PCP:  Chandra Toribio POUR, MD  Cardiologist:   Jerel Balding, MD   No chief complaint on file.   History of Present Illness:  [name] is a 83 y.o. female with complete heart block/pacemaker dependent, St. Jude pacemaker (implanted 05-11-2008, generator change 05/11/18 St. Jude Assurity), chronic diastolic heart failure, hypertension, COPD with chronic respiratory insufficiency on home oxygen  (quit smoking >25 years ago), type 2 diabetes mellitus.   She is more limited than ever by her problems with the back pain and left knee pain and has become quite deconditioned.  The family is trying to encourage her to do more home physical therapy.  She becomes easily short of breath walking from one room to the other.  She does not wear oxygen  all the time, but only as needed.  Oxygen  saturation is consistently normal at rest, promptly drops with exercise and recovers within 2 to 3 minutes of rest.  She has not seen her pulmonary specialist in more than a couple of years (Dr. Darlean).  She is not taking any inhalers at this time.  She often has a thick cough with sticky secretions.  Sometimes she has wheezing, but this is not frequent.  Has not had orthopnea, PND or any lower extremity edema.  Denies any chest pain and has not had dizziness or syncope or palpitations.  Pacemaker interrogation shows normal device function.  She has 22% atrial pacing and 98% ventricular pacing (the remainder 2% representing PVCs).  She is pacemaker dependent without any escape rhythm when pacing is taken down to VVI 40 bpm.  Symptom rhythm is atrial sensed ventricular paced at a heart rate histograms appropriate for sedentary lifestyle.  All lead parameters look good, although there is some intermittent problem with noise over sensing on the atrial  channel.  This has not interfered with normal device function.  There are some episodes of true ectopic atrial tachycardia, the episodes are brief and there is no evidence of true atrial fibrillation.  Estimated genital Reyes is approximately 4 years.  Echo 10-Sep-2023showed stable EF at 50-55% and grade 1 diastolic dysfunction, normal left atrial filling pressures, pacing related dyssynchrony as expected.  There were no serious valvular abnormalities.  She did have mild aortic stenosis and mild-moderate aortic insufficiency.  Coronary CTA 06/04/2019 showed nonobstructive CAD.   She has a history of bleeding, likely from H. pylori related duodenal ulcers in the pas has not had any recent bleeding and her most recent hemoglobin was 13.7.  She has fair metabolic control with LDL 68, HDL 64 and hemoglobin A1c 3.2%.  She had minor coronary artery disease by previous angiography and has never had angina pectoris. She had acute HF exacerbation due to severe hypertension in the weeks after her husband passed away in May 12, 2015. At that time she underwent coronary angiography that showed very minor disease (30% stenosis at the takeoff of the first diagonal artery), normal pulmonary wedge pressure and mild pulmonary hypertension (38/16).  Coronary CT angiography in October 2020 showed moderately elevated calcium score at the 62nd percentile for age/gender, and moderate (50-69%) lesions in the LAD artery and first diagonal artery, with no evidence of significant obstruction by FFR.  Past Medical History:  Diagnosis Date   Arthritis  hands, spine (12/07/2014)   CHB (complete heart block) (HCC) 09/19/2013   CHF (congestive heart failure) (HCC) dx'd 12/06/2014   Chronic pain    since neck OR in 2007   Coronary artery disease    normal coronaries by 09/10/00 cath   Diabetes mellitus    hga1c 7.1 no meds   Dysrhythmia    HTN (hypertension) 09/19/2013   Hypercholesterolemia    Hyperlipidemia 09/19/2013    Hypertension    Myocardial infarction [facility]) dx'd 2009   Neuropathy of both feet    Pacemaker    Paralysis of right hand (HCC) 2007   after spinal cord injury/OR   Shortness of breath    exertional   Type II diabetes mellitus (HCC)    had it years ago; lost weight after neck OR in 2007; glucose went way down; recently dx'd as returned recently (12/07/2014)   Urinary incontinence    since 2007's OR   Venous insufficiency (chronic) (peripheral)     Past Surgical History:  Procedure Laterality Date   ANTERIOR CERVICAL DECOMP/DISCECTOMY FUSION  2007   BACK SURGERY     BIOPSY  07/29/2019   Procedure: BIOPSY;  Surgeon: Aneita Gwendlyn DASEN, MD;  Location: [facility] ENDOSCOPY;  Service: Endoscopy;;   CARDIAC CATHETERIZATION  09/10/2000   normal Coronary arteries, EF60%,    CARDIAC PACEMAKER PLACEMENT  12/01/2007   St.Jude Zephyr JULIEANNE #[id number], dual chamber   CARDIOVASCULAR STRESS TEST  08/28/2000   Cardiolite perfusion study, EF58%, low risk study,    DILATION AND CURETTAGE OF UTERUS  1960's   ESOPHAGOGASTRODUODENOSCOPY N/A 07/29/2019   Procedure: ESOPHAGOGASTRODUODENOSCOPY (EGD);  Surgeon: Aneita Gwendlyn DASEN, MD;  Location: [facility] ENDOSCOPY;  Service: Endoscopy;  Laterality: N/A;   HOT HEMOSTASIS N/A 07/29/2019   Procedure: HOT HEMOSTASIS (ARGON PLASMA COAGULATION/BICAP);  Surgeon: Aneita Gwendlyn DASEN, MD;  Location: [facility] ENDOSCOPY;  Service: Endoscopy;  Laterality: N/A;   KNEE ARTHROSCOPY Right    LAPAROSCOPIC CHOLECYSTECTOMY  1995   LEFT AND RIGHT HEART CATHETERIZATION WITH CORONARY ANGIOGRAM N/A 12/14/2014   Procedure: LEFT AND RIGHT HEART CATHETERIZATION WITH CORONARY ANGIOGRAM;  Surgeon: Peter M Swaziland, MD;  Location: [facility] CATH LAB;  Service: Cardiovascular;  Laterality: N/A;   Lower Ext. Dopplers  06/12/2011   no evidence of thrombus, all vessels normal in size, no evidence of insuffiency   LUMBAR LAMINECTOMY/DECOMPRESSION MICRODISCECTOMY  03/02/2012   Procedure: LUMBAR LAMINECTOMY/DECOMPRESSION  MICRODISCECTOMY 2 LEVELS;  Surgeon: Catalina CHRISTELLA Stains, MD;  Location: [facility] NEURO ORS;  Service: Neurosurgery;  Laterality: Bilateral;  Lumbar three, lumbar four, lumbar five Laminectomy/Foraminotomy   MOLE REMOVAL     left side of face near her eye   NM MYOCAR PERF WALL MOTION  10/24/2008   protocol:Lexiscan, EF63%, normal study, scan negative for ischemia   PPM GENERATOR CHANGEOUT N/A 05/26/2018   Procedure: PPM GENERATOR CHANGEOUT;  Surgeon: Francyne Headland, MD;  Location: [facility] INVASIVE CV LAB;  Service: Cardiovascular;  Laterality: N/A;   TRANSTHORACIC ECHOCARDIOGRAM  11/30/2007   normal echo, mean transaortic valve gradient    Current Medications: Outpatient Medications Prior to Visit  Medication Sig Dispense Refill   acetaminophen  (TYLENOL ) 500 MG tablet Take 500 mg by mouth every 6 (six) hours as needed.     AZO CRANBERRY GUMMIES PO Take by mouth. Takes 2 gummies once daily     diclofenac  Sodium (VOLTAREN ) 1 % GEL Apply 2 g topically 4 (four) times daily. 150 g 3   Fluocinolone  Acetonide 0.01 % OIL Place 5 drops in ear(s) 2 (  two) times daily as needed. Use finger to smear last drop around ear canal opening on the outside. Use on an as-needed basis for eczema flares. 20 mL 6   furosemide  (LASIX ) 40 MG tablet TAKE 1 TABLET DAILY. MAY TAKE AN EXTRA TABLET FOR WEIGHT GAIN OF 2 LB IN A DAY & 5 LBS IN A WEEK 180 tablet 1   KLOR-CON  M20 20 MEQ tablet TAKE 1 TABLET (20 MEQ TOTAL) BY MOUTH ONCE FOR 1 DOSE. (Patient taking differently: Take 20 mEq by mouth daily.) 90 tablet 3   losartan -hydrochlorothiazide  (HYZAAR) 100-12.5 MG tablet TAKE 1 TABLET BY MOUTH EVERY DAY 90 tablet 0   Multiple Vitamins-Minerals (CENTRUM SILVER 50+WOMEN PO) Take 1 tablet by mouth daily at 6 (six) AM.     OXYGEN  2lpm with sleep and exertion [employer]     pravastatin  (PRAVACHOL ) 40 MG tablet TAKE 1 TABLET BY MOUTH EVERY DAY 90 tablet 1   Psyllium (METAMUCIL PO) Take by mouth. Takes 1 gummie daily     Respiratory  Therapy Supplies (FLUTTER) DEVI Use flutter device three times a day as instructed 1 each 0   aspirin  EC 81 MG tablet Take 81 mg by mouth daily. Swallow whole. (Patient not taking: Reported on 05/05/2024)     No facility-administered medications prior to visit.     Allergies:   Patient has no known allergies.  Family History:  The patient's \  family history includes Congestive Heart Failure in her brother, father, and mother; Diabetes in her brother, father, and mother; Heart attack in her father; Heart disease in her father and mother; Hyperlipidemia in her father and mother; Hypertension in her father and mother.   ROS:   Please see the history of present illness.    ROS All other systems are reviewed and are negative.  PHYSICAL EXAM:   VS:  BP 116/63 (BP Location: Left Arm, Patient Position: Sitting, Cuff Size: Normal)   Pulse 68   Ht 5' 5 (1.651 m)   Wt 161 lb 3.2 oz (73.1 kg)   SpO2 93%   BMI 26.83 kg/m      General: Appears elderly and frail.  In a wheelchair.  Alert, oriented x3, no distress, healthy left subclavian pacemaker site Head: no evidence of trauma, PERRL, EOMI, no exophtalmos or lid lag, no myxedema, no xanthelasma; normal ears, nose and oropharynx Neck: normal jugular venous pulsations and no hepatojugular reflux; brisk carotid pulses without delay and no carotid bruits Chest: clear to auscultation, no signs of consolidation by percussion or palpation, normal fremitus, symmetrical and full respiratory excursions Cardiovascular: normal position and quality of the apical impulse, regular rhythm, normal first and second heart sounds, no murmurs, rubs or gallops Abdomen: no tenderness or distention, no masses by palpation, no abnormal pulsatility or arterial bruits, normal bowel sounds, no hepatosplenomegaly Extremities: no clubbing, cyanosis or edema; 2+ radial, ulnar and brachial pulses bilaterally; 2+ right femoral, posterior tibial and dorsalis pedis pulses; 2+ left  femoral, posterior tibial and dorsalis pedis pulses; no subclavian or femoral bruits Neurological: grossly nonfocal Psych: Normal mood and affect    Wt Readings from Last 3 Encounters:  05/05/24 161 lb 3.2 oz (73.1 kg)  01/14/24 166 lb (75.3 kg)  09/07/23 163 lb 4 oz (74 kg)    Studies/Labs Reviewed:   Coronary CT Angio 06/04/2019: Dilated right pulmonary artery (35mm) and left pulmonary artery (33mm)   IMPRESSION: 1. Coronary calcium score of 179. This was 31 percentile for age and sex matched control.  2. Normal coronary origin with right dominance.   3. Nonobstructive CAD. There is calcified plaque in the mid LAD causing moderate (50-69%) stenosis. CT-FFR across this lesion is 0.85, suggesting lesion is not functionally significant. The plaque extends into the ostium of a diagonal branch, causing moderate (50-69%) stenosis. CT-FFR across the diagonal branch is 0.84, suggesting no functional significance   4. There is step artifact in the mid RCA. While no plaque is seen in this segment, definitive interpretation is precluded by artifact   CAD-RADS 3. Moderate stenosis. Consider symptom-guided anti-ischemic pharmacotherapy as well as risk factor modification per guideline directed care. Additional analysis with CT FFR will be submitted.  ECHO 05/12/2022:     1. Left ventricular ejection fraction, by estimation, is 50 to 55%. The  left ventricle has low normal function. The left ventricle has no regional  wall motion abnormalities. Left ventricular diastolic parameters are  consistent with Grade I diastolic  dysfunction (impaired relaxation). There is septal-lateral dyssynchrony in  the setting of RV pacing.   2. Right ventricular systolic function is normal. The right ventricular  size is normal. There is normal pulmonary artery systolic pressure. The  estimated right ventricular systolic pressure is 29.8 mmHg.   3. Left atrial size was moderately dilated.   4.  Right atrial size was mildly dilated.   5. The mitral valve is degenerative. Mild mitral valve regurgitation.  Mild-to-moderate MAC.   6. The aortic valve is tricuspid. There is moderate calcification of the  aortic valve. There is moderate thickening of the aortic valve. Aortic  valve regurgitation is mild to moderate. Mild aortic valve stenosis.  Aortic valve area, by VTI measures 1.37  cm. Aortic valve mean gradient measures 14.0 mmHg. Aortic valve Vmax  measures 2.52 m/s.   7. The inferior vena cava is normal in size with greater than 50%  respiratory variability, suggesting right atrial pressure of 3 mmHg.   Comparison(s): Compared to prior TTE in 05/2019, the EF has improved  slightly from 45-50% to 50%. Inferior wall motion appears low normal on  current study. MR appears improved to mild. There continues to be mild AS  and mild-to-moderate AR.   EKG:    EKG Interpretation Date/Time:  Thursday May 05 2024 12:02:01 EDT Ventricular Rate:  68 PR Interval:  188 QRS Duration:  158 QT Interval:  438 QTC Calculation: 465 R Axis:   -88  Text Interpretation: Atrial-sensed ventricular-paced rhythm When compared with ECG of 12-Mar-2023 09:09, Vent. rate has decreased BY   5 BPM Confirmed by Shavar Gorka [phone]) on 05/05/2024 12:04:00 PM            Recent Labs: 12/28/2023: ALT 12; BUN 23; Creatinine, Ser 0.67; Hemoglobin 13.7; Platelets 171; Potassium 3.6; Sodium 139   Lipid Panel    Component Value Date/Time   CHOL 156 12/28/2023 1037   TRIG 139 12/28/2023 1037   HDL 64 12/28/2023 1037   CHOLHDL 2.4 12/28/2023 1037   CHOLHDL 3.7 11/30/2007 0510   VLDL 35 11/30/2007 0510   LDLCALC 68 12/28/2023 1037   LDLDIRECT 76 07/18/2019 1028    ASSESSMENT:    1. PAT (paroxysmal atrial tachycardia) (HCC)   2. Chronic diastolic heart failure (HCC)   3. Coronary artery disease involving native coronary artery of native heart without angina pectoris   4. Hypercholesterolemia    5. Prediabetes   6. CHB (complete heart block) (HCC)   7. COPD mixed type (HCC)   8. PAH (pulmonary artery hypertension) (HCC)  9. History of iron deficiency anemia    PLAN:  In order of problems listed above:  CHF: Clinically she appears to be euvolemic and she has not required diuretic dose adjustment.  Shortness of breath is related to advanced lung disease.  Preserved LVEF.  Mild reduction in LV systolic function primarily due to RV pacing related dyssynchrony.  Euvolemic clinically.  On angiotensin receptor blocker.  Only requiring a low-dose of loop diuretic. PAT: Some of the episodes recorded on her device are real but they are always brief/nonsustained and they are asymptomatic.  1% burden of mode switch recorded by pacemaker exaggerates the true prevalence of arrhythmia: Many of the episodes are artifactual due to atrial lead noise oversensing. CAD: Does not have angina pectoris.  Moderate lesions on fairly recent coronary CT angiogram.   Focus on treatment of risk factors.   HLP: Excellent LDL cholesterol, continue current statin prescription. Pre DM: Diet controlled with hemoglobin A1c 6.7%. CHB: Pacemaker dependent.  No escape rhythm. PM: Normal device function except for some mild atrial over sensing which has not interfered with normal device function.  Continue remote downloads every 3 months. COPD: With chronic respiratory insufficiency with hypoxia with activity.  Encouraged her to wear her oxygen  all the time and think of it as a medication that can improve pulmonary artery pressures and cardiac performance.  Encouraged her to follow-up with her pulmonary specialist to discuss whether or not inhalers would be beneficial. PAH: per previous right heart catheterization she has arteriolar pulmonary hypertension due to chronic lung disease (WHO group 3). Anemia: Normal hemoglobin currently, even while taking aspirin .  History of gastric ulcers which have not returned after  treatment for H. pylori.    Medication Adjustments/Labs and Tests Ordered: Current medicines are reviewed at length with the patient today.  Concerns regarding medicines are outlined above.  Medication changes, Labs and Tests ordered today are listed in the Patient Instructions below. Patient Instructions  Medication Instructions:  No changes *If you need a refill on your cardiac medications before your next appointment, please call your pharmacy*  Lab Work: None ordered If you have labs (blood work) drawn today and your tests are completely normal, you will receive your results only by: MyChart Message (if you have MyChart) OR A paper copy in the mail If you have any lab test that is abnormal or we need to change your treatment, we will call you to review the results.  Testing/Procedures: None ordered  Follow-Up: At [facility], you and your health needs are our priority.  As part of our continuing mission to provide you with exceptional heart care, our providers are all part of one team.  This team includes your primary Cardiologist (physician) and Advanced Practice Providers or APPs (Physician Assistants and Nurse Practitioners) who all work together to provide you with the care you need, when you need it.  Your next appointment:   1 year(s)  Provider:   Jerel Balding, MD    We recommend signing up for the patient portal called MyChart.  Sign up information is provided on this After Visit Summary.  MyChart is used to connect with patients for Virtual Visits (Telemedicine).  Patients are able to view lab/test results, encounter notes, upcoming appointments, etc.  Non-urgent messages can be sent to your provider as well.   To learn more about what you can do with MyChart, go to [email or web address]       Signed, Jerel Balding, MD  05/05/2024 3:42 PM  [facility] [address] [place], [place], [place]  [address] Phone: [phone]; Fax: [phone]

## 2024-05-16 ENCOUNTER — Ambulatory Visit: Admitting: Family Medicine

## 2024-06-06 ENCOUNTER — Encounter: Payer: Self-pay | Admitting: Family Medicine

## 2024-06-06 DIAGNOSIS — B351 Tinea unguium: Secondary | ICD-10-CM

## 2024-06-06 DIAGNOSIS — L601 Onycholysis: Secondary | ICD-10-CM

## 2024-06-07 ENCOUNTER — Telehealth: Payer: Self-pay | Admitting: Podiatry

## 2024-06-08 ENCOUNTER — Ambulatory Visit (INDEPENDENT_AMBULATORY_CARE_PROVIDER_SITE_OTHER): Payer: Medicare Other

## 2024-06-08 DIAGNOSIS — I5032 Chronic diastolic (congestive) heart failure: Secondary | ICD-10-CM | POA: Diagnosis not present

## 2024-06-09 LAB — CUP PACEART REMOTE DEVICE CHECK
Battery Remaining Longevity: 47 mo
Battery Remaining Percentage: 41 %
Battery Voltage: 2.96 V
Brady Statistic AP VP Percent: 23 %
Brady Statistic AP VS Percent: 1 %
Brady Statistic AS VP Percent: 75 %
Brady Statistic AS VS Percent: 1 %
Brady Statistic RA Percent Paced: 21 %
Brady Statistic RV Percent Paced: 98 %
Date Time Interrogation Session: 20251007020026
Implantable Lead Connection Status: 753985
Implantable Lead Connection Status: 753985
Implantable Lead Implant Date: 20090401
Implantable Lead Implant Date: 20090401
Implantable Lead Location: 753859
Implantable Lead Location: 753860
Implantable Pulse Generator Implant Date: 20190925
Lead Channel Impedance Value: 430 Ohm
Lead Channel Impedance Value: 440 Ohm
Lead Channel Pacing Threshold Amplitude: 0.75 V
Lead Channel Pacing Threshold Amplitude: 0.875 V
Lead Channel Pacing Threshold Pulse Width: 0.5 ms
Lead Channel Pacing Threshold Pulse Width: 0.5 ms
Lead Channel Sensing Intrinsic Amplitude: 12 mV
Lead Channel Sensing Intrinsic Amplitude: 2 mV
Lead Channel Setting Pacing Amplitude: 1.125
Lead Channel Setting Pacing Amplitude: 2 V
Lead Channel Setting Pacing Pulse Width: 0.5 ms
Lead Channel Setting Sensing Sensitivity: 4 mV
Pulse Gen Model: 2272
Pulse Gen Serial Number: 9064942

## 2024-06-10 ENCOUNTER — Ambulatory Visit

## 2024-06-10 DIAGNOSIS — L603 Nail dystrophy: Secondary | ICD-10-CM

## 2024-06-10 NOTE — Progress Notes (Signed)
 Remote PPM Transmission

## 2024-06-10 NOTE — Progress Notes (Signed)
  Subjective:  [name], female    DOB: 08-21-1941,  MRN: [id number]  83 y.o. female presents with her brother for evaluation and treatment of her toenails.  They have noticed that the left hallux has become more opaque and less clear.  They are concerned about fungus.   PCP is Tammy Toribio POUR, MD   No Known Allergies  Review of Systems: Negative except as noted in the HPI.   Objective:  [name] is a pleasant 83 y.o. female in NAD. AAO x 3. AAO x 3.  Vascular Examination: Faintly palpable pedal pulses b/l. CFT immediate b/l. Pedal hair absent. No edema. No pain with calf compression b/l. Skin temperature gradient WNL b/l. No varicosities noted. No cyanosis or clubbing noted.  Neurological Examination: Sensation grossly intact b/l with 10 gram monofilament. Negative tinel sign at tarsal tunnel bilaterally.   Dermatological Examination: Pedal skin with normal turgor, texture and tone b/l. No open wounds nor interdigital macerations noted.  Left hallux toenail has normal texture, thickness with opaque changes to the distal aspect, still clear at the proximal aspect.  Minimal lifting off of underlying nailbed at the distal aspect.  Right hallux nail elongated without any dystrophic changes.  No hyperkeratotic lesions noted b/l.   Musculoskeletal Examination: Muscle strength 5/5 to b/l LE.  No pain, crepitus noted b/l. No gross pedal deformities. Patient ambulates independently without assistive aids.   Radiographs: None Last A1c:      Latest Ref Rng & Units 12/28/2023   10:37 AM 09/07/2023    1:47 PM  Hemoglobin A1C  Hemoglobin-A1c 4.8 - 5.6 % 6.7  6.6      Assessment:   1. Nail dystrophy     Plan:  Patient evaluated and treated.  Nail dystrophy - Left hallux nail has slight lifting with loss of adherence to underlying nailbed at the distal aspect.  The proximal aspect is still well adhered and the nail was not unstable. - Left hallux nail and  elongated right hallux nail were debrided of length.  Pinpoint bleeding to right hallux cleaned with alcohol. - Discussed that based on presentation of isolated nail with slight loss of adherence, do not believe that this is a fungal process that is occurring.  Appears to be consistent with trauma when the nail was long and would have been susceptible to irritation.   Return to clinic as needed.  [name], DPM AACFAS Fellowship Trained Podiatric Surgeon [facility]       [place] LOCATION: [address], [place] [address]                   Office [phone]   [facility] LOCATION: [address] [place], [place] [address] Office [phone]

## 2024-06-13 NOTE — Progress Notes (Signed)
 Remote PPM Transmission

## 2024-06-17 ENCOUNTER — Other Ambulatory Visit: Payer: Self-pay | Admitting: Cardiovascular Disease

## 2024-06-20 ENCOUNTER — Ambulatory Visit: Payer: Self-pay | Admitting: Cardiovascular Disease

## 2024-07-18 ENCOUNTER — Ambulatory Visit: Admitting: Family Medicine

## 2024-08-07 ENCOUNTER — Other Ambulatory Visit: Payer: Self-pay | Admitting: Family Medicine

## 2024-08-07 DIAGNOSIS — E1159 Type 2 diabetes mellitus with other circulatory complications: Secondary | ICD-10-CM

## 2024-08-10 ENCOUNTER — Ambulatory Visit: Admitting: Family Medicine

## 2024-08-10 ENCOUNTER — Encounter: Payer: Self-pay | Admitting: Family Medicine

## 2024-08-10 VITALS — BP 138/74 | HR 89 | Ht 65.0 in | Wt 148.1 lb

## 2024-08-10 DIAGNOSIS — E119 Type 2 diabetes mellitus without complications: Secondary | ICD-10-CM

## 2024-08-10 DIAGNOSIS — R152 Fecal urgency: Secondary | ICD-10-CM

## 2024-08-10 DIAGNOSIS — E785 Hyperlipidemia, unspecified: Secondary | ICD-10-CM

## 2024-08-10 DIAGNOSIS — J9611 Chronic respiratory failure with hypoxia: Secondary | ICD-10-CM | POA: Diagnosis not present

## 2024-08-10 DIAGNOSIS — M1712 Unilateral primary osteoarthritis, left knee: Secondary | ICD-10-CM

## 2024-08-10 DIAGNOSIS — I2721 Secondary pulmonary arterial hypertension: Secondary | ICD-10-CM | POA: Diagnosis not present

## 2024-08-10 DIAGNOSIS — E1169 Type 2 diabetes mellitus with other specified complication: Secondary | ICD-10-CM | POA: Diagnosis not present

## 2024-08-10 DIAGNOSIS — H9193 Unspecified hearing loss, bilateral: Secondary | ICD-10-CM

## 2024-08-10 DIAGNOSIS — R159 Full incontinence of feces: Secondary | ICD-10-CM

## 2024-08-10 LAB — POCT GLYCOSYLATED HEMOGLOBIN (HGB A1C): HbA1c POC (<> result, manual entry): 6.5 % (ref 4.0–5.6)

## 2024-08-10 NOTE — Patient Instructions (Signed)
 It was nice to see you today,  We addressed the following topics today: - We will get a blood test at your next visit, which should be around April of next year. - You can try different over-the-counter creams for your knee pain as they are generally safe. - I encourage you to try wearing your hearing aids, as not hearing well can be linked to your memory getting worse.  Have a great day,  Rolan Slain, MD

## 2024-08-10 NOTE — Progress Notes (Signed)
 Established Patient Office Visit  Subjective   [name], female    DOB: 1940-10-06  Age: 83 y.o. MRN: [id number]  Chief Complaint  Patient presents with   Medical Management of Chronic Issues    HPI  Subjective - Follow-up visit. Primary complaint of severe left knee pain. - Left knee pain has been present for 2-3 years with a burning sensation. Received a gel injection from orthopedics 1.5-2 weeks ago. Reports the knee gives way, requiring a walker for ambulation. Has had two falls, one landing on her back, without head injury. The trip hazard (oxygen  tubing) has been removed. - Decreased mobility and short-term memory decline noted by brother. - Reports not wearing hearing aids due to irritation and discomfort. Caregiver reports difficulty with dexterity for insertion and operation of the device. Has not worn them for approximately 6 months. Acknowledges link between hearing loss and memory decline. - Bowel incontinence reported by brother, occurring approximately 50% of the time, particularly after eating out. Not using anti-diarrheal medication due to constipation. Uses pull-ups. Reports one good bowel movement daily and denies issues with incontinence.  Medications: Lasix , losartan , potassium, pravastatin , Tylenol  650mg  TID, multivitamin, cranberry gummies, Metamucil capsules. Uses over-the-counter Voltaren  gel infrequently.  PMH: Osteoarthritis (spine, knees), hypertension, hearing loss, bowel incontinence. PSH: None mentioned. FH: Not mentioned. Social Hx: 83 year old female. Daughter-in-law assists with bathing. Family prepares meals. Does not cook. Reports being sedentary, but also reports doing exercises (chair squats, toe raises, pedal machine) at home, which brother cannot confirm. Uses a walker. Has handrails in the bathroom and a tub chair.  ROS: Constitutional: No fevers, chills. Reports weight loss. HEENT: Reports hearing loss. Musculoskeletal: Reports  severe left knee pain, burning, and instability. Gastrointestinal: Reports daily bowel movements. Denies incontinence, though caregiver reports it.     The ASCVD Risk score (Arnett DK, et al., 2019) failed to calculate for the following reasons:   The 2019 ASCVD risk score is only valid for ages 52 to 63   Risk score cannot be calculated because patient has a medical history suggesting prior/existing ASCVD   * - Cholesterol units were assumed  Health Maintenance Due  Topic Date Due   OPHTHALMOLOGY EXAM  Never done   Bone Density Scan  Never done   Pneumococcal Vaccine: 50+ Years (2 of 2 - PCV) 06/02/2007   FOOT EXAM  06/03/2024      Objective:     BP 138/74   Pulse 89   Ht 5' 5 (1.651 m)   Wt 148 lb 1.9 oz (67.2 kg)   SpO2 92%   BMI 24.65 kg/m    Physical Exam Gen: alert, oriented. Accompanied by brother.  Pulm:  lctab. No wheeze.   Psych: pleasant affect   Results for orders placed or performed in visit on 08/10/24  POCT glycosylated hemoglobin (Hb A1C)  Result Value Ref Range   Hemoglobin A1C     HbA1c POC (<> result, manual entry) 6.5 4.0 - 5.6 %   HbA1c, POC (prediabetic range)     HbA1c, POC (controlled diabetic range)          Assessment & Plan:   Primary osteoarthritis of left knee Assessment & Plan: History of chronic left knee pain with instability and falls. Has failed multiple treatments including gel injections and physical therapy. Currently using Tylenol  and infrequent topical Voltaren . Orthopedics is managing with injections.     - Continue current management with Tylenol  and walker use.     -  Counselled on risk of falls with knee buckling.     - pt is not interested in joint replacement.     - Discussed limited efficacy of gel/steroid injections.     - May try various over-the-counter topical analgesics.   Hyperlipidemia associated with type 2 diabetes mellitus (HCC) -     POCT glycosylated hemoglobin (Hb A1C)  Controlled type 2  diabetes mellitus without complication, without long-term current use of insulin  (HCC) -     POCT glycosylated hemoglobin (Hb A1C)  Decreased hearing of both ears Assessment & Plan: Long-standing hearing loss. Non-compliant with hearing aids due to discomfort and difficulty with operation.     - Counselled on the link between uncorrected hearing loss and accelerated cognitive decline.     - Encouraged to attempt wearing hearing aids more consistently.     - Discussed possibility of exploring simpler hearing aid models with her audiologist.   Incontinence of feces with fecal urgency Assessment & Plan: Reports from caregiver of intermittent fecal incontinence. Previously tried anti-diarrheal medication from lat visit but stopped d/t constipation. Is not interested in restarrting.       - Continue Metamucil for fiber.     - Continue use of pull-ups for containment.   Chronic respiratory failure with hypoxia (HCC) Assessment & Plan: Continue 2L o2 w/ exertion and sleep.    PAH (pulmonary artery hypertension) (HCC) Assessment & Plan: Who class 3 d/t pulm disease. Continue supp o2.  Continue mgmt of copd      Return in about 4 months (around 12/09/2024) for HTN, DM.    Toribio MARLA Slain, MD

## 2024-08-13 DIAGNOSIS — H9190 Unspecified hearing loss, unspecified ear: Secondary | ICD-10-CM | POA: Insufficient documentation

## 2024-08-13 DIAGNOSIS — M179 Osteoarthritis of knee, unspecified: Secondary | ICD-10-CM | POA: Insufficient documentation

## 2024-08-13 NOTE — Assessment & Plan Note (Signed)
 Who class 3 d/t pulm disease. Continue supp o2.  Continue mgmt of copd

## 2024-08-13 NOTE — Assessment & Plan Note (Signed)
 Continue 2L o2 w/ exertion and sleep.

## 2024-08-13 NOTE — Assessment & Plan Note (Signed)
 History of chronic left knee pain with instability and falls. Has failed multiple treatments including gel injections and physical therapy. Currently using Tylenol  and infrequent topical Voltaren . Orthopedics is managing with injections.     - Continue current management with Tylenol  and walker use.     - Counselled on risk of falls with knee buckling.     - pt is not interested in joint replacement.     - Discussed limited efficacy of gel/steroid injections.     - May try various over-the-counter topical analgesics.

## 2024-08-13 NOTE — Assessment & Plan Note (Signed)
 Reports from caregiver of intermittent fecal incontinence. Previously tried anti-diarrheal medication from lat visit but stopped d/t constipation. Is not interested in restarrting.       - Continue Metamucil for fiber.     - Continue use of pull-ups for containment.

## 2024-08-13 NOTE — Assessment & Plan Note (Signed)
 Long-standing hearing loss. Non-compliant with hearing aids due to discomfort and difficulty with operation.     - Counselled on the link between uncorrected hearing loss and accelerated cognitive decline.     - Encouraged to attempt wearing hearing aids more consistently.     - Discussed possibility of exploring simpler hearing aid models with her audiologist.

## 2024-08-16 ENCOUNTER — Encounter: Payer: Self-pay | Admitting: Family Medicine

## 2024-08-17 ENCOUNTER — Other Ambulatory Visit: Payer: Self-pay | Admitting: Family Medicine

## 2024-08-17 DIAGNOSIS — H9193 Unspecified hearing loss, bilateral: Secondary | ICD-10-CM

## 2024-08-30 ENCOUNTER — Encounter: Payer: Self-pay | Admitting: Family Medicine

## 2024-09-02 NOTE — Telephone Encounter (Signed)
 PATIENT is schedule

## 2024-09-07 ENCOUNTER — Ambulatory Visit

## 2024-09-07 DIAGNOSIS — I442 Atrioventricular block, complete: Secondary | ICD-10-CM

## 2024-09-08 LAB — CUP PACEART REMOTE DEVICE CHECK
Battery Remaining Longevity: 46 mo
Battery Remaining Percentage: 38 %
Battery Voltage: 2.96 V
Brady Statistic AP VP Percent: 23 %
Brady Statistic AP VS Percent: 1 %
Brady Statistic AS VP Percent: 75 %
Brady Statistic AS VS Percent: 1 %
Brady Statistic RA Percent Paced: 21 %
Brady Statistic RV Percent Paced: 98 %
Date Time Interrogation Session: 20260107020015
Implantable Lead Connection Status: 753985
Implantable Lead Connection Status: 753985
Implantable Lead Implant Date: 20090401
Implantable Lead Implant Date: 20090401
Implantable Lead Location: 753859
Implantable Lead Location: 753860
Implantable Pulse Generator Implant Date: 20190925
Lead Channel Impedance Value: 390 Ohm
Lead Channel Impedance Value: 480 Ohm
Lead Channel Pacing Threshold Amplitude: 0.75 V
Lead Channel Pacing Threshold Amplitude: 0.75 V
Lead Channel Pacing Threshold Pulse Width: 0.5 ms
Lead Channel Pacing Threshold Pulse Width: 0.5 ms
Lead Channel Sensing Intrinsic Amplitude: 1.8 mV
Lead Channel Sensing Intrinsic Amplitude: 12 mV
Lead Channel Setting Pacing Amplitude: 1 V
Lead Channel Setting Pacing Amplitude: 2 V
Lead Channel Setting Pacing Pulse Width: 0.5 ms
Lead Channel Setting Sensing Sensitivity: 4 mV
Pulse Gen Model: 2272
Pulse Gen Serial Number: 9064942

## 2024-09-12 ENCOUNTER — Ambulatory Visit: Admitting: Family Medicine

## 2024-09-12 VITALS — BP 125/73 | HR 72 | Ht 65.0 in | Wt 152.0 lb

## 2024-09-12 DIAGNOSIS — R5381 Other malaise: Secondary | ICD-10-CM

## 2024-09-12 DIAGNOSIS — S5401XD Injury of ulnar nerve at forearm level, right arm, subsequent encounter: Secondary | ICD-10-CM | POA: Diagnosis not present

## 2024-09-12 DIAGNOSIS — S5401XA Injury of ulnar nerve at forearm level, right arm, initial encounter: Secondary | ICD-10-CM | POA: Insufficient documentation

## 2024-09-12 NOTE — Progress Notes (Signed)
" ° °  Established Patient Office Visit  Subjective   [name], female    DOB: 01-10-1941  Age: 84 y.o. MRN: [id number]  Chief Complaint  Patient presents with   Medical Management of Chronic Issues     History of Present Illness   [name] is an 84 year old female who presents with concerns about movement disorders and mobility issues.  She is accompanied by her brother.   She has difficulty with complex fine-motor tasks like writing checks and organizing medications. Her family notes decreased engagement in prior hobbies. She denies hallucinations, vivid dreams, or depressed mood. She can dress herself including buttons and manages personal hygiene, though she needs some assistance with bathing.  She uses a walker at home due to fear of falling. She reports intermittent episodes where her feet feel stuck to the floor and she must stop moving. She has had two falls in the past year causing significant soreness but no fractures. Family observes very small steps, poor upright balance, tight grip on her walker, and a stooped posture with difficulty standing fully upright. She performs physical therapy exercises with family three times weekly.  She has a right-hand tremor, especially when handling her hearing aids. She notes progressive finger contracture in the right hand since prior neck surgery. She denies numbness or tingling in the limbs. The right hand remains limited from the prior operation.          The ASCVD Risk score (Arnett DK, et al., 2019) failed to calculate for the following reasons:   The 2019 ASCVD risk score is only valid for ages 29 to 64   Risk score cannot be calculated because patient has a medical history suggesting prior/existing ASCVD   * - Cholesterol units were assumed  Health Maintenance Due  Topic Date Due   OPHTHALMOLOGY EXAM  Never done   Bone Density Scan  Never done   Pneumococcal Vaccine: 50+ Years (2 of 2 - PCV) 06/02/2007    FOOT EXAM  06/03/2024      Objective:     BP 125/73   Pulse 72   Ht 5' 5 (1.651 m)   Wt 152 lb (68.9 kg)   SpO2 90%   BMI 25.29 kg/m    Physical Exam   Gen: alert, oriented Cv: rrr Pulm: no respiratory distress Neuro: no cogwheel rigidity or bradykinesia.  Normal toe tap and finger tapping MSK: gait and mobility impaired by deconditioning, arthritis.        No results found for any visits on 09/12/24.      Assessment & Plan:   Physical deconditioning Assessment & Plan: Impaired mobility likely due to deconditioning. No evidence of Parkinson's disease or other movement disorders. Previous physical therapy beneficial. - Ordered home health physical therapy to evaluate and improve mobility. - Encouraged continuation of home exercises and physical therapy. - Will consider referral to physical medicine and rehabilitation if needed.   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 Shaking in the right hand is intermittently present, typically when nervous.  - Continue to monitor hand function and consider occupational therapy if needed.       I spent 30 min in the mgmt of this pt  Return for already on file.    Toribio MARLA Slain, MD  "

## 2024-09-12 NOTE — Assessment & Plan Note (Signed)
 Hand contracture in the right hand secondary to prior neck surgery. Shaking in the right hand is intermittently present, typically when nervous.  - Continue to monitor hand function and consider occupational therapy if needed.

## 2024-09-12 NOTE — Progress Notes (Signed)
 Remote PPM Transmission

## 2024-09-12 NOTE — Assessment & Plan Note (Signed)
 Impaired mobility likely due to deconditioning. No evidence of Parkinson's disease or other movement disorders. Previous physical therapy beneficial. - Ordered home health physical therapy to evaluate and improve mobility. - Encouraged continuation of home exercises and physical therapy. - Will consider referral to physical medicine and rehabilitation if needed.

## 2024-09-12 NOTE — Patient Instructions (Signed)
 It was nice to see you today,  We addressed the following topics today: - I will send in the referral to the physical therapist.  Someone will call you to schedule these home health visits.   Have a great day,  Rolan Slain, MD

## 2024-09-13 ENCOUNTER — Ambulatory Visit: Payer: Self-pay | Admitting: Cardiovascular Disease

## 2024-09-14 ENCOUNTER — Ambulatory Visit: Attending: Family Medicine | Admitting: Audiologist

## 2024-09-14 ENCOUNTER — Encounter: Payer: Self-pay | Admitting: Audiologist

## 2024-09-14 DIAGNOSIS — H903 Sensorineural hearing loss, bilateral: Secondary | ICD-10-CM | POA: Diagnosis present

## 2024-09-14 NOTE — Procedures (Signed)
" °  [facility] [address] [place], [place] [address] [phone]  AUDIOLOGICAL EVALUATION  Name: [name]    Status: Outpatient DOB: 02-Feb-1941    Referent: Chandra Toribio POUR, MD MRN: [id number] Date: 09/14/2024     Diagnosis: Sensorineural hearing loss, bilateral   HISTORY: [name], 84 y.o., was seen for an audiological evaluation.   [name] notices increased difficulty hearing. She brought her hearing aids with her today.  [name] denies ear pain, pressure, tinnitus, or dizziness.    EVALUATION: Otoscopic inspection reveals occluding cerumen bilaterally.   Tympanometry was completed to assess middle ear status. Normal, Type A tympanograms were obtained bilaterally.  Standard audiometric techniques were used to obtain thresholds under high frequency headphones. Speech reception thresholds are 60 dBHL on the right and 45 dBHL on the left using recorded spondee word lists. Word recognition was 68% at 85 dBHL on the right and 88% at 80 dBHL on the left using recorded NU-6 word lists, in quiet. A mild sloping to severe sensorineural hearing loss is present in the left ear and a mild sloping to profound sensorineural hearing loss is present in the right ear.  CONCLUSION:  [name] has a mild sloping to profound high frequency sensorineural hearing loss in the right ear and a mild sloping to severe sensorineural hearing loss in the left ear.  Word recognition is good in quiet at elevated conversational speech levels bilaterally. Findings were reviewed with the patient. Compared to previous testing in 2021, a decline in hearing in the right ear is noted. The left ear remains stable.  RECOMMENDATIONS: Repeat hearing evaluation as medically indicated. Continued use of binaural amplification to assist in daily listening situations. A list of area hearing aid providers was given to the patient. Reprogramming of hearing aids to current hearing test. Use of  communication strategies to improve communication in difficult listening situations.  Audiogram scanned in under media tab.  Delon EMERSON Baumgartner, Au.D., CCC-A Audiologist 09/14/2024  cc: Chandra Toribio POUR, MD  "

## 2024-09-29 ENCOUNTER — Encounter: Payer: Self-pay | Admitting: Family Medicine

## 2024-09-30 ENCOUNTER — Other Ambulatory Visit: Payer: Self-pay | Admitting: Family Medicine

## 2024-09-30 DIAGNOSIS — R5381 Other malaise: Secondary | ICD-10-CM

## 2024-09-30 DIAGNOSIS — I5042 Chronic combined systolic (congestive) and diastolic (congestive) heart failure: Secondary | ICD-10-CM

## 2024-09-30 DIAGNOSIS — M1712 Unilateral primary osteoarthritis, left knee: Secondary | ICD-10-CM

## 2024-09-30 DIAGNOSIS — G8929 Other chronic pain: Secondary | ICD-10-CM

## 2024-12-07 ENCOUNTER — Encounter

## 2024-12-07 ENCOUNTER — Other Ambulatory Visit

## 2024-12-14 ENCOUNTER — Ambulatory Visit: Admitting: Family Medicine

## 2024-12-29 ENCOUNTER — Ambulatory Visit

## 2025-03-08 ENCOUNTER — Encounter

## 2025-06-07 ENCOUNTER — Encounter

## 2025-09-06 ENCOUNTER — Encounter

## 2025-12-06 ENCOUNTER — Encounter
